# Patient Record
Sex: Male | Born: 1935
Health system: Southern US, Community
[De-identification: ages and names within clinical notes are randomized; demographics above are authoritative.]

## PROBLEM LIST (undated history)

## (undated) DIAGNOSIS — K219 Gastro-esophageal reflux disease without esophagitis: Secondary | ICD-10-CM

## (undated) DIAGNOSIS — N184 Chronic kidney disease, stage 4 (severe): Secondary | ICD-10-CM

## (undated) DIAGNOSIS — C61 Malignant neoplasm of prostate: Secondary | ICD-10-CM

## (undated) DIAGNOSIS — I10 Essential (primary) hypertension: Secondary | ICD-10-CM

## (undated) DIAGNOSIS — E119 Type 2 diabetes mellitus without complications: Secondary | ICD-10-CM

## (undated) DIAGNOSIS — M1990 Unspecified osteoarthritis, unspecified site: Secondary | ICD-10-CM

## (undated) DIAGNOSIS — N186 End stage renal disease: Secondary | ICD-10-CM

## (undated) DIAGNOSIS — D649 Anemia, unspecified: Secondary | ICD-10-CM

## (undated) DIAGNOSIS — E785 Hyperlipidemia, unspecified: Secondary | ICD-10-CM

## (undated) DIAGNOSIS — Z992 Dependence on renal dialysis: Secondary | ICD-10-CM

## (undated) DIAGNOSIS — N289 Disorder of kidney and ureter, unspecified: Secondary | ICD-10-CM

## (undated) HISTORY — DX: Dependence on renal dialysis: Z99.2

## (undated) HISTORY — PX: COLONOSCOPY: SHX5424

## (undated) HISTORY — PX: CYSTECTOMY: SUR359

## (undated) HISTORY — DX: End stage renal disease: N18.6

## (undated) NOTE — *Deleted (*Deleted)
Transition of Care [facility]) - CM/SW Discharge Note   Patient Details  Name: Trumon Berding MRN: [id number] Date of Birth: May 22, 1936  Transition of Care [facility]) CM/SW Contact:  Sharin Mons, RN Phone Number: 08/28/2020, 10:00 AM   Clinical Narrative:     Patient will DC to: home  Anticipated DC date: 08/28/2020 Family notified: yes Transport by: car   Per MD patient ready for DC today . RN, patient and  patient's family aware of DC.  tient.   RNCM will sign off for now as intervention is no longer needed. Please consult [place] again if new needs arise.   Final next level of care: [place] Barriers to Discharge: No Barriers Identified   Patient Goals and CMS Choice Patient states their goals for this hospitalization and ongoing recovery are:: to get better   Choice offered to / list presented to : Patient  Discharge Placement                       Discharge Plan and Services   Discharge Planning Services: CM Consult Post Acute Care Choice: Durable Medical Equipment          DME Arranged: 3-N-1 DME Agency: [employer] Date DME Agency Contacted: 08/28/20 Time DME Agency Contacted: [phone] Representative spoke with at DME Agency: Nurse to give from floor stock prior to d/c HH Arranged: PT, OT [place] Agency: [place] Date [place]: 08/28/20 Time [place]: [phone] Representative spoke with at [place]: [place] ([place]) Interventions     Readmission Risk Interventions No flowsheet data found.

---

## 2007-07-05 ENCOUNTER — Ambulatory Visit: Payer: Self-pay | Admitting: Cardiology

## 2009-06-12 DIAGNOSIS — R079 Chest pain, unspecified: Secondary | ICD-10-CM | POA: Insufficient documentation

## 2010-09-12 ENCOUNTER — Encounter: Payer: Self-pay | Admitting: Cardiovascular Disease

## 2013-01-11 DIAGNOSIS — E1121 Type 2 diabetes mellitus with diabetic nephropathy: Secondary | ICD-10-CM | POA: Insufficient documentation

## 2013-02-18 ENCOUNTER — Encounter: Payer: Self-pay | Admitting: Cardiovascular Disease

## 2013-02-18 DIAGNOSIS — R0602 Shortness of breath: Secondary | ICD-10-CM

## 2013-03-16 DIAGNOSIS — R079 Chest pain, unspecified: Secondary | ICD-10-CM

## 2013-04-11 ENCOUNTER — Encounter: Payer: Self-pay | Admitting: Cardiology

## 2013-04-12 ENCOUNTER — Encounter: Payer: Self-pay | Admitting: *Deleted

## 2013-04-12 ENCOUNTER — Ambulatory Visit (INDEPENDENT_AMBULATORY_CARE_PROVIDER_SITE_OTHER): Payer: PRIVATE HEALTH INSURANCE | Admitting: Cardiovascular Disease

## 2013-04-12 ENCOUNTER — Encounter: Payer: Self-pay | Admitting: Cardiovascular Disease

## 2013-04-12 VITALS — BP 163/76 | HR 63 | Ht 65.0 in | Wt 176.0 lb

## 2013-04-12 DIAGNOSIS — R079 Chest pain, unspecified: Secondary | ICD-10-CM

## 2013-04-12 DIAGNOSIS — I10 Essential (primary) hypertension: Secondary | ICD-10-CM | POA: Insufficient documentation

## 2013-04-12 DIAGNOSIS — R0602 Shortness of breath: Secondary | ICD-10-CM

## 2013-04-12 DIAGNOSIS — R9431 Abnormal electrocardiogram [ECG] [EKG]: Secondary | ICD-10-CM | POA: Insufficient documentation

## 2013-04-12 MED ORDER — HYDRALAZINE HCL 50 MG PO TABS: 50.0000 mg | ORAL_TABLET | Freq: Two times a day (BID) | ORAL | Status: DC

## 2013-04-12 NOTE — Patient Instructions (Addendum)
   Treadmill Myoview (stress test)  Office will contact with results via phone or letter.    Increase Hydralazine to 50mg  twice a day - new sent to pharm  Continue all other current medications. Follow up in  4-6 weeks

## 2013-04-12 NOTE — Progress Notes (Signed)
Patient ID: Remijio Harvard, male   DOB: 1936/09/11, 77 y.o.   MRN: [id number]    CARDIOLOGY CONSULT NOTE  Patient ID: Ciaran Schwarzkopf MRN: [id number] DOB/AGE: [birth year] 77 y.o.  Admit date: (Not on file) Primary Physician Primary Cardiologist Reason for Consultation: chest pain, shortness of breath  HPI: Mr. Musselwhite is a very pleasant 77 y.o. Male with a h/o HTN and hypercholesterolemia. He also has renal insufficiency with a most recent creatinine of 1.8 mg/dL and a GFR of 45 ml/min. His echocardiogram from 02-18-2013 showed normal LV systolic function, EF 123456, moderate concentric LVH, grade II diastolic dysfunction, mild MR/TR, and moderate pulmonary hypertension with an RVSP of 46 mmHg. His HTN is treated with Hydralazine, Cozaar, and long-acting Nifedipine. His ECG on 02-18-13 showed TWI's in V5-V6 which were less pronounced on a follow-up ECG on 03-16-13.  A CXR on 02-18-13 was normal. His most recent stress test was from 2011 and showed no perfusion abnormalities.  He presents today with chest tightness. He thinks it may be due to stress. It's been going on for a year, but says "it's not that bad". He also has a hiatal hernia. It is accompanied by shortness of breath, but it does not limit his exertional capacity. It may last up to 20 minutes. It is located substernally and sometimes it radiates to the left side of his neck. Yesterday, he had an episode of dizziness but did not lose consciousness. He also mentions he's had right ear tinnitus since December 2013. He seldom has palpitations.  Review of systems complete and found to be negative unless listed above in HPI  Past Medical History: As per HPI  Social Hx: Drove a truck for 52 years, retired in October 2013.  Family History  Problem Relation Age of Onset  . Heart disease Mother     cause of death  . Diabetes Sister     History   Social History  . Marital Status: Divorced    Spouse Name: N/A    Number of Children: N/A   . Years of Education: N/A   Occupational History  . Retired    Social History Main Topics  . Smoking status: Former Smoker -- 2.00 packs/day for 25 years    Types: Pipe, Cigars    Quit date: 10/14/1987  . Smokeless tobacco: Never Used     Comment: About 2 pipes a day  . Alcohol Use: No  . Drug Use: No  . Sexually Active: Not on file   Other Topics Concern  . Not on file   Social History Narrative  . No narrative on file      (Not in a hospital admission)  Physical exam Blood pressure 163/76, pulse 63, height 5\' 5"  (1.651 m), weight 176 lb (79.833 kg), SpO2 99.00%. General: NAD Neck: No JVD, no thyromegaly or thyroid nodule.  Lungs: Clear to auscultation bilaterally with normal respiratory effort. CV: Nondisplaced PMI.  Heart regular S1/S2, no S3/S4, no murmur.  No peripheral edema.  No carotid bruit.  Normal pedal pulses.  Abdomen: Soft, nontender, no hepatosplenomegaly, no distention.  Skin: Intact without lesions or rashes.  Neurologic: Alert and oriented x 3.  Psych: Normal affect. Extremities: No clubbing or cyanosis.  HEENT: Normal.   Labs:   No results found for this basename: WBC, HGB, HCT, MCV, PLT   No results found for this basename: NA, K, CL, CO2, BUN, CREATININE, CALCIUM, LABALBU, PROT, BILITOT, ALKPHOS, ALT, AST, GLUCOSE,  in the last 168 hours No  results found for this basename: CKTOTAL, CKMB, CKMBINDEX, TROPONINI    No results found for this basename: CHOL   No results found for this basename: HDL   No results found for this basename: LDLCALC   No results found for this basename: TRIG   No results found for this basename: CHOLHDL   No results found for this basename: LDLDIRECT       EKG: see HPI Radiology: previous CXR normal in May 2014.   ASSESSMENT AND PLAN:  1. Chest pain and SOB: will plan for a treadmill Myoview stress test to r/o occult ischemia.  2. HTN: will increase Hydralazine to 50 mg bid.  Signed: @ME1 @ 04/12/2013, 3:19  PM Co-Sign MD

## 2013-04-13 ENCOUNTER — Other Ambulatory Visit: Payer: Self-pay | Admitting: *Deleted

## 2013-04-13 ENCOUNTER — Telehealth: Payer: Self-pay | Admitting: Cardiovascular Disease

## 2013-04-13 DIAGNOSIS — R079 Chest pain, unspecified: Secondary | ICD-10-CM

## 2013-04-13 DIAGNOSIS — R9431 Abnormal electrocardiogram [ECG] [EKG]: Secondary | ICD-10-CM

## 2013-04-13 DIAGNOSIS — R0602 Shortness of breath: Secondary | ICD-10-CM

## 2013-04-13 NOTE — Telephone Encounter (Signed)
Treadmill Myoview (stress test) Weight 176 04-18-13 [place]     Checking percert  Chest pain -

## 2013-04-13 NOTE — Telephone Encounter (Signed)
Clinicals faxed to [facility]

## 2013-04-14 NOTE — Telephone Encounter (Signed)
Auth # [id number] valid 04/18/13 - 05/19/13

## 2013-04-18 DIAGNOSIS — R9431 Abnormal electrocardiogram [ECG] [EKG]: Secondary | ICD-10-CM

## 2013-05-16 ENCOUNTER — Ambulatory Visit (INDEPENDENT_AMBULATORY_CARE_PROVIDER_SITE_OTHER): Payer: PRIVATE HEALTH INSURANCE | Admitting: Cardiovascular Disease

## 2013-05-16 ENCOUNTER — Encounter: Payer: Self-pay | Admitting: Cardiovascular Disease

## 2013-05-16 VITALS — BP 117/67 | HR 88 | Ht 65.0 in | Wt 175.0 lb

## 2013-05-16 DIAGNOSIS — I10 Essential (primary) hypertension: Secondary | ICD-10-CM

## 2013-05-16 DIAGNOSIS — R9431 Abnormal electrocardiogram [ECG] [EKG]: Secondary | ICD-10-CM

## 2013-05-16 DIAGNOSIS — R0602 Shortness of breath: Secondary | ICD-10-CM

## 2013-05-16 DIAGNOSIS — R079 Chest pain, unspecified: Secondary | ICD-10-CM

## 2013-05-16 NOTE — Progress Notes (Signed)
Patient ID: Steve [name], male   DOB: 1936/10/08, 77 y.o.   MRN: [id number]    SUBJECTIVE: Steve [name] is a very pleasant 77 y.o. Male with a h/o HTN and hypercholesterolemia. He also has renal insufficiency with a most recent creatinine of 1.8 mg/dL and a GFR of 45 ml/min. His echocardiogram from 02-18-2013 showed normal LV systolic function, EF 123456, moderate concentric LVH, grade II diastolic dysfunction, mild MR/TR, and moderate pulmonary hypertension with an RVSP of 46 mmHg.  His HTN is treated with Hydralazine, Cozaar, and long-acting Nifedipine.  He is here to f/u on his stress test, which showed normal myocardial perfusion with limited exercise tolerance (exercised for 1:52, achieving 4.6 METS). There was a small inferior area of reversibility that appeared to be most consistent with soft tissue attenuation.  He tells me that just preceding his stress test, his feet had a gout flare-up which lasted about a week, but now he feels very well. This is what apparently limited his ability to walk on the treadmill.  He currently denies chest pain and says he very seldom gets shortness of breath. He seldom has palpitations, and this is never accompanied by lightheadedness or dizziness.  I increased his Hydralazine at the last office visit, and his BP is now controlled.  He drove a truck for 54 years.   Filed Vitals:   05/16/13 0848  BP: 117/67  Pulse: 88  Height: 5\' 5"  (1.651 m)  Weight: 175 lb (79.379 kg)    PHYSICAL EXAM General: NAD Neck: No JVD, no thyromegaly or thyroid nodule.  Lungs: Clear to auscultation bilaterally with normal respiratory effort. CV: Nondisplaced PMI.  Heart regular S1/S2, no S3/S4, no murmur.  No peripheral edema.  No carotid bruit.  Normal pedal pulses.  Abdomen: Soft, nontender, no hepatosplenomegaly, no distention.  Neurologic: Alert and oriented x 3.  Psych: Normal affect. Extremities: No clubbing or cyanosis.     LABS: Basic Metabolic Panel: No  results found for this basename: NA, K, CL, CO2, GLUCOSE, BUN, CREATININE, CALCIUM, MG, PHOS,  in the last 72 hours Liver Function Tests: No results found for this basename: AST, ALT, ALKPHOS, BILITOT, PROT, ALBUMIN,  in the last 72 hours No results found for this basename: LIPASE, AMYLASE,  in the last 72 hours CBC: No results found for this basename: WBC, NEUTROABS, HGB, HCT, MCV, PLT,  in the last 72 hours Cardiac Enzymes: No results found for this basename: CKTOTAL, CKMB, CKMBINDEX, TROPONINI,  in the last 72 hours BNP: No components found with this basename: POCBNP,  D-Dimer: No results found for this basename: DDIMER,  in the last 72 hours Hemoglobin A1C: No results found for this basename: HGBA1C,  in the last 72 hours Fasting Lipid Panel: No results found for this basename: CHOL, HDL, LDLCALC, TRIG, CHOLHDL, LDLDIRECT,  in the last 72 hours Thyroid Function Tests: No results found for this basename: TSH, T4TOTAL, FREET3, T3FREE, THYROIDAB,  in the last 72 hours Anemia Panel: No results found for this basename: VITAMINB12, FOLATE, FERRITIN, TIBC, IRON, RETICCTPCT,  in the last 72 hours  RADIOLOGY: No results found.    ASSESSMENT AND PLAN: 1. Chest pain and SOB: resolved, normal myocardial perfusion noted, no further testing indicated. 2. HTN: controlled with recent increase in Hydralazine dosage. I strongly encouraged walking and light to moderate exercise in general.    Kate Sable, M.D., F.A.C.C.

## 2013-05-16 NOTE — Patient Instructions (Signed)
Your physician recommends that you schedule a follow-up appointment in: 1 year with Dr. Bronson Ing. You should receive a letter in 10 months to call and schedule this appointment. If you do not receive this letter around June 2015 call our office to schedule this appointment.  Continue all current medications. Refer to the list given to you today.

## 2013-12-03 ENCOUNTER — Encounter: Payer: Self-pay | Admitting: Cardiovascular Disease

## 2013-12-04 ENCOUNTER — Encounter: Payer: Self-pay | Admitting: Cardiovascular Disease

## 2013-12-12 ENCOUNTER — Ambulatory Visit (INDEPENDENT_AMBULATORY_CARE_PROVIDER_SITE_OTHER): Payer: Medicare Other | Admitting: Cardiovascular Disease

## 2013-12-12 ENCOUNTER — Encounter: Payer: Self-pay | Admitting: Cardiovascular Disease

## 2013-12-12 VITALS — BP 138/74 | HR 88 | Ht 65.0 in | Wt 177.1 lb

## 2013-12-12 DIAGNOSIS — J40 Bronchitis, not specified as acute or chronic: Secondary | ICD-10-CM

## 2013-12-12 DIAGNOSIS — I10 Essential (primary) hypertension: Secondary | ICD-10-CM

## 2013-12-12 DIAGNOSIS — R0602 Shortness of breath: Secondary | ICD-10-CM

## 2013-12-12 DIAGNOSIS — R079 Chest pain, unspecified: Secondary | ICD-10-CM

## 2013-12-12 DIAGNOSIS — R002 Palpitations: Secondary | ICD-10-CM

## 2013-12-12 NOTE — Patient Instructions (Signed)
Your physician recommends that you schedule a follow-up appointment in: 6 weeks with Dr. Bronson Ing. This appointment will be scheduled today before you leave.  Your physician recommends that you continue on your current medications as directed. Please refer to the Current Medication list given to you today.

## 2013-12-12 NOTE — Progress Notes (Signed)
Patient ID: Steve [name], male   DOB: 03/22/1936, 78 y.o.   MRN: [id number]      SUBJECTIVE: Steve [name] is a very pleasant 78 yr old male with a h/o HTN and hypercholesterolemia and renal insufficiency. His echocardiogram from 02-18-2013 showed normal LV systolic function, EF 123456, moderate concentric LVH, grade II diastolic dysfunction, mild MR/TR, and moderate pulmonary hypertension with an RVSP of 46 mmHg.  His HTN is treated with hydralazine, Cozaar, and long-acting nifedipine.  He had a stress test in 2014 which showed normal myocardial perfusion with limited exercise tolerance (exercised for 1:52, achieving 4.6 METS). There was a small inferior area of reversibility that appeared to be most consistent with soft tissue attenuation.  He was recently evaluated at [facility] for chest pain. At that time, he described a squeezing sensation in his anterior chest wall. His symptoms were relieved with nitroglycerin. He reports having occasional chest discomfort when he walks up and down a hallway. He was evaluated by nephrology and their recommendation was to maintain a high threshold for coronary angiography, given the patient's high risk for contrast-induced nephropathy. Troponins were normal during that hospitalization and his ECG he reportedly did not show any acute ischemic changes. When cardiology evaluated him, they recommended that he undergo coronary angiography once his CKD was stable.  He was recently diagnosed with bronchitis and was started on ciprofloxacin and prednisone. He is now feeling much n no longer has any chest tightness nor a bothersome cough, which he had been experiencing for nearly 6-8 weeks. He has felt some occasional palpitations. He is otherwise feeling much better.     No Known Allergies  Current Outpatient Prescriptions  Medication Sig Dispense Refill  . acetaminophen (TYLENOL) 650 MG CR tablet Take 650 mg by mouth every 8 (eight) hours as needed  for pain.      Marland Kitchen albuterol-ipratropium (COMBIVENT) 18-103 MCG/ACT inhaler Inhale 1-2 puffs into the lungs every 4 (four) hours.      . Alum & Mag Hydroxide-Simeth (ANTACID ANTI-GAS FAST ACTING PO) Take 1 tablet by mouth as needed.      Marland Kitchen aspirin 81 MG tablet Take 81 mg by mouth daily.      . carboxymethylcellulose (REFRESH TEARS) 0.5 % SOLN Place 1 drop into both eyes 3 (three) times daily as needed.      . Cholecalciferol (VITAMIN D3) 2000 UNITS capsule Take 2,000 Units by mouth daily.      . ciprofloxacin (CIPRO) 500 MG tablet Take 500 mg by mouth 2 (two) times daily.      . hydrALAZINE (APRESOLINE) 50 MG tablet Take 1 tablet (50 mg total) by mouth 2 (two) times daily.  60 tablet  6  . isosorbide mononitrate (IMDUR) 30 MG 24 hr tablet Take 30 mg by mouth daily.      Marland Kitchen linagliptin (TRADJENTA) 5 MG TABS tablet Take 5 mg by mouth daily.      Marland Kitchen losartan (COZAAR) 100 MG tablet Take 1 tablet by mouth daily.      Marland Kitchen NIFEdipine (PROCARDIA XL/ADALAT-CC) 60 MG 24 hr tablet Take 1 tablet by mouth daily.      Marland Kitchen omeprazole (PRILOSEC) 20 MG capsule Take 1 capsule by mouth 2 (two) times daily.      . pravastatin (PRAVACHOL) 40 MG tablet Take 1 tablet by mouth daily.      . predniSONE (DELTASONE) 10 MG tablet Take 10 mg by mouth as directed.      . Sodium Chloride (NASAL MIST  IN) Place 1 spray into the nose as needed.      . tamsulosin (FLOMAX) 0.4 MG CAPS Take 1 capsule by mouth 2 (two) times daily.        No current facility-administered medications for this visit.    No past medical history on file.  Past Surgical History  Procedure Laterality Date  . Cystectomy      History   Social History  . Marital Status: Divorced    Spouse Name: N/A    Number of Children: N/A  . Years of Education: N/A   Occupational History  . Retired    Social History Main Topics  . Smoking status: Former Smoker -- 2.00 packs/day for 25 years    Types: Pipe, Cigars    Quit date: 10/14/1987  . Smokeless tobacco:  Never Used     Comment: About 2 pipes a day  . Alcohol Use: No  . Drug Use: No  . Sexual Activity: Not on file   Other Topics Concern  . Not on file   Social History Narrative  . No narrative on file     Filed Vitals:   12/12/13 1327  Height: 5\' 5"  (1.651 m)  Weight: 177 lb 1.9 oz (80.341 kg)    PHYSICAL EXAM General: NAD Neck: No JVD, no thyromegaly. Lungs: Clear to auscultation bilaterally with normal respiratory effort. CV: Nondisplaced PMI.  Regular rate and rhythm, normal S1/S2, no S3/S4, no murmur. No pretibial or periankle edema.  No carotid bruit.  Normal pedal pulses.  Abdomen: Soft, nontender, no hepatosplenomegaly, no distention.  Neurologic: Alert and oriented x 3.  Psych: Normal affect. Extremities: No clubbing or cyanosis.   ECG: reviewed and available in electronic records.      ASSESSMENT AND PLAN: 1. Chest pain: Again, he had a normal nuclear stress test last year. His symptoms appear to have resolved with the institution of prednisone for bronchitis. I will followup with him in 6 weeks to see if he has a recurrence of chest pain. At that time, I will determine whether we will proceed with coronary angiography. 2. Palpitations: The symptoms are likely exacerbated by the use of prednisone. Again, I will followup with him in 6 weeks to see how he is doing from this standpoint. 3. HTN: Controlled on present therapy.  Dispo: f/u 6 weeks.  Kate Sable, M.D., F.A.C.C.

## 2014-02-03 ENCOUNTER — Encounter: Payer: Self-pay | Admitting: Cardiovascular Disease

## 2014-02-03 ENCOUNTER — Ambulatory Visit: Payer: PRIVATE HEALTH INSURANCE | Admitting: Cardiovascular Disease

## 2014-02-03 ENCOUNTER — Ambulatory Visit (INDEPENDENT_AMBULATORY_CARE_PROVIDER_SITE_OTHER): Payer: Medicare Other | Admitting: Cardiovascular Disease

## 2014-02-03 VITALS — BP 152/80 | HR 78 | Ht 65.0 in | Wt 176.0 lb

## 2014-02-03 DIAGNOSIS — R079 Chest pain, unspecified: Secondary | ICD-10-CM

## 2014-02-03 DIAGNOSIS — C61 Malignant neoplasm of prostate: Secondary | ICD-10-CM

## 2014-02-03 DIAGNOSIS — R002 Palpitations: Secondary | ICD-10-CM

## 2014-02-03 DIAGNOSIS — I10 Essential (primary) hypertension: Secondary | ICD-10-CM

## 2014-02-03 MED ORDER — HYDRALAZINE HCL 50 MG PO TABS: 50.0000 mg | ORAL_TABLET | Freq: Three times a day (TID) | ORAL | Status: DC

## 2014-02-03 NOTE — Progress Notes (Signed)
Patient ID: Steve [name], male   DOB: 05/08/1936, 78 y.o.   MRN: [id number]      SUBJECTIVE: Steve [name] is a very pleasant 78 yr old male with a h/o HTN and hypercholesterolemia and renal insufficiency. His echocardiogram from 02-18-2013 showed normal LV systolic function, EF 123456, moderate concentric LVH, grade II diastolic dysfunction, mild MR/TR, and moderate pulmonary hypertension with an RVSP of 46 mmHg.  His HTN is treated with hydralazine, Cozaar, and long-acting nifedipine.   He had a stress test in 2014 which showed normal myocardial perfusion with limited exercise tolerance (exercised for 1:52, achieving 4.6 METS). There was a small inferior area of reversibility that appeared to be most consistent with soft tissue attenuation.  He was evaluated earlier this year at [facility] for chest pain. At that time, he described a squeezing sensation in his anterior chest wall. His symptoms were relieved with nitroglycerin. He reported having occasional chest discomfort when he walks up and down a hallway. He was evaluated by nephrology and their recommendation was to maintain a high threshold for coronary angiography, given the patient's high risk for contrast-induced nephropathy. Troponins were normal during that hospitalization and his ECG he reportedly did not show any acute ischemic changes. When cardiology evaluated him, they recommended that he undergo coronary angiography once his CKD was stable.   He currently denies any chest pain. He has been having infrequent and episodic palpitations. He was diagnosed with prostate cancer approximately 3 weeks ago, and is planning to undergo radiation treatment. He is also currently being treated for a prostate infection. Due to this, he has had a lot of stress. He checks his blood pressure at home and systolic readings have been in the 150 mmHg range.    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    No Known Allergies  Current  Outpatient Prescriptions  Medication Sig Dispense Refill  . acetaminophen (TYLENOL) 650 MG CR tablet Take 650 mg by mouth every 8 (eight) hours as needed for pain.      Marland Kitchen aspirin 81 MG tablet Take 81 mg by mouth daily.      . Cholecalciferol (VITAMIN D3) 2000 UNITS capsule Take 2,000 Units by mouth daily.      . hydrALAZINE (APRESOLINE) 50 MG tablet Take 1 tablet (50 mg total) by mouth 2 (two) times daily.  60 tablet  6  . linagliptin (TRADJENTA) 5 MG TABS tablet Take 5 mg by mouth daily.      Marland Kitchen losartan (COZAAR) 100 MG tablet Take 1 tablet by mouth daily.      Marland Kitchen NIFEdipine (PROCARDIA XL/ADALAT-CC) 60 MG 24 hr tablet Take 1 tablet by mouth daily.      . NON FORMULARY Take 1 capsule by mouth 3 (three) times daily as needed. LIPO-FLAVONOID PLUS (INNER EAR HEALTH) OTC      . omeprazole (PRILOSEC) 20 MG capsule Take 1 capsule by mouth 2 (two) times daily.      . pravastatin (PRAVACHOL) 40 MG tablet Take 1 tablet by mouth daily.      . tamsulosin (FLOMAX) 0.4 MG CAPS Take 1 capsule by mouth 2 (two) times daily.        No current facility-administered medications for this visit.    No past medical history on file.  Past Surgical History  Procedure Laterality Date  . Cystectomy      History   Social History  . Marital Status: Divorced    Spouse Name: N/A  Number of Children: N/A  . Years of Education: N/A   Occupational History  . Retired    Social History Main Topics  . Smoking status: Former Smoker -- 2.00 packs/day for 25 years    Types: Pipe, Cigars    Quit date: 10/14/1987  . Smokeless tobacco: Never Used     Comment: About 2 pipes a day  . Alcohol Use: No  . Drug Use: No  . Sexual Activity: Not on file   Other Topics Concern  . Not on file   Social History Narrative  . No narrative on file     Filed Vitals:   02/03/14 0944  BP: 152/80  Pulse: 78  Height: 5\' 5"  (1.651 m)  Weight: 176 lb (79.833 kg)    PHYSICAL EXAM General: NAD Neck: No JVD, no  thyromegaly. Lungs: Clear to auscultation bilaterally with normal respiratory effort. CV: Nondisplaced PMI.  Regular rate and rhythm, normal S1/S2, no S3/S4, no murmur. No pretibial or periankle edema.  No carotid bruit.  Normal pedal pulses.  Abdomen: Soft, nontender, no hepatosplenomegaly, no distention.  Neurologic: Alert and oriented x 3.  Psych: Normal affect. Extremities: No clubbing or cyanosis.   ECG: reviewed and available in electronic records.      ASSESSMENT AND PLAN: 1. Chest pain: No further episodes. Normal nuclear stress test last year. I will see him again in 2-3 months, and will determine whether or not to proceed with coronary angiography.  2. Palpitations: Infrequent at this time. No therapy indicated. 3. HTN: Elevated on present therapy. Will increase hydralazine to 50 mg tid. 4. Prostate cancer: Sees Dr. Exie Parody. Soon to begin radiation treatment.  Dispo: f/u 2-3 months.   Kate Sable, M.D., F.A.C.C.

## 2014-02-03 NOTE — Patient Instructions (Signed)
   Increase Hydralazine to 50mg  three times per day - new sent to pharm Continue all other medications.   Follow up in  4 months

## 2014-02-04 ENCOUNTER — Emergency Department (HOSPITAL_COMMUNITY)
Admission: EM | Admit: 2014-02-04 | Discharge: 2014-02-04 | Disposition: A | Discharge status: Home / Self Care | Payer: Medicare Other | Attending: Emergency Medicine | Admitting: Emergency Medicine

## 2014-02-04 ENCOUNTER — Emergency Department (HOSPITAL_COMMUNITY): Payer: Medicare Other

## 2014-02-04 ENCOUNTER — Encounter (HOSPITAL_COMMUNITY): Payer: Self-pay | Admitting: Emergency Medicine

## 2014-02-04 DIAGNOSIS — Z7982 Long term (current) use of aspirin: Secondary | ICD-10-CM | POA: Insufficient documentation

## 2014-02-04 DIAGNOSIS — R072 Precordial pain: Secondary | ICD-10-CM | POA: Insufficient documentation

## 2014-02-04 DIAGNOSIS — Z859 Personal history of malignant neoplasm, unspecified: Secondary | ICD-10-CM | POA: Insufficient documentation

## 2014-02-04 DIAGNOSIS — Z79899 Other long term (current) drug therapy: Secondary | ICD-10-CM | POA: Insufficient documentation

## 2014-02-04 DIAGNOSIS — M129 Arthropathy, unspecified: Secondary | ICD-10-CM | POA: Insufficient documentation

## 2014-02-04 DIAGNOSIS — Z792 Long term (current) use of antibiotics: Secondary | ICD-10-CM | POA: Insufficient documentation

## 2014-02-04 DIAGNOSIS — Z87891 Personal history of nicotine dependence: Secondary | ICD-10-CM | POA: Insufficient documentation

## 2014-02-04 DIAGNOSIS — I10 Essential (primary) hypertension: Secondary | ICD-10-CM | POA: Insufficient documentation

## 2014-02-04 DIAGNOSIS — E119 Type 2 diabetes mellitus without complications: Secondary | ICD-10-CM | POA: Insufficient documentation

## 2014-02-04 DIAGNOSIS — R079 Chest pain, unspecified: Secondary | ICD-10-CM

## 2014-02-04 HISTORY — DX: Unspecified osteoarthritis, unspecified site: M19.90

## 2014-02-04 LAB — COMPREHENSIVE METABOLIC PANEL
ALT: 17 U/L (ref 0–53)
AST: 20 U/L (ref 0–37)
Albumin: 3.8 g/dL (ref 3.5–5.2)
Alkaline Phosphatase: 62 U/L (ref 39–117)
BUN: 23 mg/dL (ref 6–23)
CO2: 21 mEq/L (ref 19–32)
Calcium: 9.1 mg/dL (ref 8.4–10.5)
Chloride: 102 mEq/L (ref 96–112)
Creatinine, Ser: 2.41 mg/dL — ABNORMAL HIGH (ref 0.50–1.35)
GFR calc Af Amer: 28 mL/min — ABNORMAL LOW (ref >90)
GFR calc non Af Amer: 25 mL/min — ABNORMAL LOW (ref >90)
Glucose, Bld: 207 mg/dL — ABNORMAL HIGH (ref 70–99)
Potassium: 3.9 mEq/L (ref 3.7–5.3)
Sodium: 137 mEq/L (ref 137–147)
Total Bilirubin: 0.4 mg/dL (ref 0.3–1.2)
Total Protein: 7.1 g/dL (ref 6.0–8.3)

## 2014-02-04 LAB — CBC WITH DIFFERENTIAL/PLATELET
Basophils Absolute: 0 10*3/uL (ref 0.0–0.1)
Basophils Relative: 0 % (ref 0–1)
Eosinophils Absolute: 0 10*3/uL (ref 0.0–0.7)
Eosinophils Relative: 1 % (ref 0–5)
HCT: 36.2 % — ABNORMAL LOW (ref 39.0–52.0)
Hemoglobin: 12.5 g/dL — ABNORMAL LOW (ref 13.0–17.0)
Lymphocytes Relative: 20 % (ref 12–46)
Lymphs Abs: 0.9 10*3/uL (ref 0.7–4.0)
MCH: 26 pg (ref 26.0–34.0)
MCHC: 34.5 g/dL (ref 30.0–36.0)
MCV: 75.3 fL — ABNORMAL LOW (ref 78.0–100.0)
Monocytes Absolute: 0.7 10*3/uL (ref 0.1–1.0)
Monocytes Relative: 17 % — ABNORMAL HIGH (ref 3–12)
Neutro Abs: 2.7 10*3/uL (ref 1.7–7.7)
Neutrophils Relative %: 62 % (ref 43–77)
Platelets: 171 10*3/uL (ref 150–400)
RBC: 4.81 MIL/uL (ref 4.22–5.81)
RDW: 13.7 % (ref 11.5–15.5)
WBC: 4.3 10*3/uL (ref 4.0–10.5)

## 2014-02-04 LAB — TROPONIN I
Troponin I: <0.3 ng/mL (ref <0.30)
Troponin I: <0.3 ng/mL (ref <0.30)

## 2014-02-04 MED ORDER — HYDROCODONE-ACETAMINOPHEN 5-325 MG PO TABS
1.0000 | ORAL_TABLET | Freq: Once | ORAL | Status: AC
  Administered 2014-02-04: 1 via ORAL
  Filled 2014-02-04: qty 1

## 2014-02-04 MED ORDER — HYDROCODONE-ACETAMINOPHEN 5-325 MG PO TABS: 1.0000 | ORAL_TABLET | Freq: Four times a day (QID) | ORAL | Status: DC | PRN

## 2014-02-04 NOTE — ED Notes (Addendum)
Pt presents with left sided chest pain and pressure that started yesterday. Pt states that he has been feeling dizzy and lightheaded. Denies N/V. NAD noted. Airway patent.

## 2014-02-04 NOTE — ED Provider Notes (Signed)
CSN: [id number]     Arrival date & time 02/04/14  1454 History   First MD Initiated Contact with Patient 02/04/14 1530     Chief Complaint  Patient presents with  . Chest Pain     (Consider location/radiation/quality/duration/timing/severity/associated sxs/prior Treatment) Patient is a 78 y.o. male presenting with chest pain. The history is provided by the patient (the pt complains of chest pain. he was seen by cardiology yesterday).  Chest Pain Pain location:  Substernal area Pain quality: aching   Pain radiates to:  Does not radiate Pain radiates to the back: no   Pain severity:  Moderate Onset quality:  Gradual Timing:  Constant Chronicity:  Recurrent Context: breathing   Associated symptoms: no abdominal pain, no back pain, no cough, no fatigue and no headache     Past Medical History  Diagnosis Date  . Hypertension   . Diabetes mellitus without complication   . Cancer   . Arthritis    Past Surgical History  Procedure Laterality Date  . Cystectomy     Family History  Problem Relation Age of Onset  . Heart disease Mother     cause of death  . Diabetes Sister    History  Substance Use Topics  . Smoking status: Former Smoker -- 2.00 packs/day for 25 years    Types: Pipe, Cigars    Quit date: 10/14/1987  . Smokeless tobacco: Never Used     Comment: About 2 pipes a day  . Alcohol Use: No    Review of Systems  Constitutional: Negative for appetite change and fatigue.  HENT: Negative for congestion, ear discharge and sinus pressure.   Eyes: Negative for discharge.  Respiratory: Negative for cough.   Cardiovascular: Positive for chest pain.  Gastrointestinal: Negative for abdominal pain and diarrhea.  Genitourinary: Negative for frequency and hematuria.  Musculoskeletal: Negative for back pain.  Skin: Negative for rash.  Neurological: Negative for seizures and headaches.  Psychiatric/Behavioral: Negative for hallucinations.      Allergies  Review of  patient's allergies indicates no known allergies.  Home Medications   Prior to Admission medications   Medication Sig Start Date End Date Taking? Authorizing Provider  acetaminophen (TYLENOL) 650 MG CR tablet Take 650 mg by mouth every 8 (eight) hours as needed for pain.   Yes Historical Provider, MD  aspirin 81 MG tablet Take 81 mg by mouth daily.   Yes Historical Provider, MD  Cholecalciferol (VITAMIN D3) 2000 UNITS capsule Take 2,000 Units by mouth daily.   Yes Historical Provider, MD  hydrALAZINE (APRESOLINE) 50 MG tablet Take 50 mg by mouth 2 (two) times daily. 02/03/14  Yes Herminio Commons, MD  linagliptin (TRADJENTA) 5 MG TABS tablet Take 5 mg by mouth daily.   Yes Historical Provider, MD  losartan (COZAAR) 100 MG tablet Take 1 tablet by mouth daily. 04/06/13  Yes Historical Provider, MD  NIFEdipine (PROCARDIA XL/ADALAT-CC) 60 MG 24 hr tablet Take 1 tablet by mouth daily. 04/07/13  Yes Historical Provider, MD  NON FORMULARY Take 1 capsule by mouth 3 (three) times daily as needed. LIPO-FLAVONOID PLUS (INNER EAR HEALTH) OTC   Yes Historical Provider, MD  omeprazole (PRILOSEC) 20 MG capsule Take 1 capsule by mouth 2 (two) times daily. 04/06/13  Yes Historical Provider, MD  pravastatin (PRAVACHOL) 40 MG tablet Take 1 tablet by mouth daily. 04/06/13  Yes Historical Provider, MD  sulfamethoxazole-trimethoprim (BACTRIM DS) 800-160 MG per tablet Take 1 tablet by mouth 2 (two) times daily.  Yes Historical Provider, MD  tamsulosin (FLOMAX) 0.4 MG CAPS Take 1 capsule by mouth 2 (two) times daily.  04/06/13  Yes Historical Provider, MD  HYDROcodone-acetaminophen (NORCO/VICODIN) 5-325 MG per tablet Take 1 tablet by mouth every 6 (six) hours as needed for moderate pain. 02/04/14   Maudry Diego, MD   BP 129/58  Pulse 86  Temp(Src) 98.2 F (36.8 C) (Oral)  Resp 16  Ht 5\' 5"  (1.651 m)  Wt 176 lb (79.833 kg)  BMI 29.29 kg/m2  SpO2 100% Physical Exam  Constitutional: He is oriented to person,  place, and time. He appears well-developed.  HENT:  Head: Normocephalic.  Eyes: Conjunctivae and EOM are normal. No scleral icterus.  Neck: Neck supple. No thyromegaly present.  Cardiovascular: Normal rate and regular rhythm.  Exam reveals no gallop and no friction rub.   No murmur heard. Pulmonary/Chest: No stridor. He has no wheezes. He has no rales. He exhibits no tenderness.  Abdominal: He exhibits no distension. There is no tenderness. There is no rebound.  Musculoskeletal: Normal range of motion. He exhibits no edema.  Lymphadenopathy:    He has no cervical adenopathy.  Neurological: He is oriented to person, place, and time. He exhibits normal muscle tone. Coordination normal.  Skin: No rash noted. No erythema.  Psychiatric: He has a normal mood and affect. His behavior is normal.    ED Course  Procedures (including critical care time) Labs Review Labs Reviewed  CBC WITH DIFFERENTIAL - Abnormal; Notable for the following:    Hemoglobin 12.5 (*)    HCT 36.2 (*)    MCV 75.3 (*)    Monocytes Relative 17 (*)    All other components within normal limits  COMPREHENSIVE METABOLIC PANEL - Abnormal; Notable for the following:    Glucose, Bld 207 (*)    Creatinine, Ser 2.41 (*)    GFR calc non Af Amer 25 (*)    GFR calc Af Amer 28 (*)    All other components within normal limits  TROPONIN I  TROPONIN I    Imaging Review Dg Chest 2 View  02/04/2014   CLINICAL DATA:  Weakness and shortness of breath.  Chest pain.  EXAM: CHEST  2 VIEW  COMPARISON:  PA and lateral chest 12/07/2013 and single view of the chest 09/12/2013.  FINDINGS: Lungs are clear. Mild cardiomegaly is again seen. No pneumothorax or pleural effusion.  IMPRESSION: Mild cardiomegaly without acute disease.   Electronically Signed   By: Inge Rise M.D.   On: 02/04/2014 16:07     EKG Interpretation   Date/Time:  Saturday February 04 2014 15:24:02 EDT Ventricular Rate:  97 PR Interval:  194 QRS Duration: 106 QT  Interval:  364 QTC Calculation: 462 R Axis:   5 Text Interpretation:  Normal sinus rhythm Normal ECG No previous ECGs  available Confirmed by Arwyn Besaw  MD, Kaitrin Seybold [phone]) on 02/04/2014 8:09:43 PM      MDM   Final diagnoses:  Chest pain    The chart was scribed for me under my direct supervision.  I personally performed the history, physical, and medical decision making and all procedures in the evaluation of this patient.Maudry Diego, MD 02/04/14 2012

## 2014-02-04 NOTE — Discharge Instructions (Signed)
Call your heart md next week and let them know how you are doing

## 2014-05-29 ENCOUNTER — Ambulatory Visit: Payer: PRIVATE HEALTH INSURANCE | Admitting: Cardiovascular Disease

## 2014-06-21 ENCOUNTER — Ambulatory Visit: Payer: Medicare Other | Admitting: Cardiovascular Disease

## 2014-07-10 ENCOUNTER — Ambulatory Visit (INDEPENDENT_AMBULATORY_CARE_PROVIDER_SITE_OTHER): Payer: Medicare Other | Admitting: Cardiovascular Disease

## 2014-07-10 ENCOUNTER — Encounter: Payer: Self-pay | Admitting: Cardiovascular Disease

## 2014-07-10 VITALS — BP 136/77 | HR 74 | Ht 65.0 in | Wt 178.0 lb

## 2014-07-10 DIAGNOSIS — R079 Chest pain, unspecified: Secondary | ICD-10-CM

## 2014-07-10 DIAGNOSIS — I10 Essential (primary) hypertension: Secondary | ICD-10-CM

## 2014-07-10 DIAGNOSIS — E1129 Type 2 diabetes mellitus with other diabetic kidney complication: Secondary | ICD-10-CM

## 2014-07-10 DIAGNOSIS — C61 Malignant neoplasm of prostate: Secondary | ICD-10-CM | POA: Insufficient documentation

## 2014-07-10 DIAGNOSIS — N058 Unspecified nephritic syndrome with other morphologic changes: Secondary | ICD-10-CM

## 2014-07-10 DIAGNOSIS — E785 Hyperlipidemia, unspecified: Secondary | ICD-10-CM

## 2014-07-10 DIAGNOSIS — E1121 Type 2 diabetes mellitus with diabetic nephropathy: Secondary | ICD-10-CM

## 2014-07-10 NOTE — Patient Instructions (Signed)
Continue all current medications. Follow up in  3 months 

## 2014-07-10 NOTE — Progress Notes (Signed)
Patient ID: Joshau Kishbaugh, male   DOB: 11/18/1935, 78 y.o.   MRN: [id number]      SUBJECTIVE: Mr. Vives has a history of HTN, hypercholesterolemia, prostate cancer and CKD. Echocardiogram from 02-18-2013 showed normal LV systolic function, EF 123456, moderate concentric LVH, grade II diastolic dysfunction, mild MR/TR, and moderate pulmonary hypertension with an RVSP of 46 mmHg.  He had a stress test in 2014 which showed normal myocardial perfusion with limited exercise tolerance (exercised for 1:52, achieving 4.6 METS). There was a small inferior area of reversibility that appeared to be most consistent with soft tissue attenuation.  He was evaluated earlier this year at [facility] for chest pain, and again in the ED on 7/30.  On 7/30, ECG, troponin, BNP, CBC and chest xray were all normal. BUN 16, creatinine 1.71.  He has SL nitro but did not take it. He has had no further episodes. He has an occasional "pounding sensation" in the back of his head and has checked BP during those times and they were normal. He completed radiation treatment for prostate cancer.   Review of Systems: As per "subjective", otherwise negative.  No Known Allergies  Current Outpatient Prescriptions  Medication Sig Dispense Refill  . acetaminophen (TYLENOL) 650 MG CR tablet Take 650 mg by mouth every 8 (eight) hours as needed for pain.      Marland Kitchen aspirin 81 MG tablet Take 81 mg by mouth daily.      . Cholecalciferol (VITAMIN D3) 2000 UNITS capsule Take 2,000 Units by mouth daily.      . hydrALAZINE (APRESOLINE) 50 MG tablet Take 50 mg by mouth 2 (two) times daily.      Marland Kitchen HYDROcodone-acetaminophen (NORCO/VICODIN) 5-325 MG per tablet Take 1 tablet by mouth every 6 (six) hours as needed for moderate pain.  20 tablet  0  . linagliptin (TRADJENTA) 5 MG TABS tablet Take 5 mg by mouth daily.      Marland Kitchen losartan (COZAAR) 100 MG tablet Take 1 tablet by mouth daily.      Marland Kitchen NIFEdipine (PROCARDIA XL/ADALAT-CC) 60 MG  24 hr tablet Take 1 tablet by mouth daily.      . NON FORMULARY Take 1 capsule by mouth 3 (three) times daily as needed. LIPO-FLAVONOID PLUS (INNER EAR HEALTH) OTC      . omeprazole (PRILOSEC) 20 MG capsule Take 1 capsule by mouth 2 (two) times daily.      . pravastatin (PRAVACHOL) 40 MG tablet Take 1 tablet by mouth daily.      Marland Kitchen sulfamethoxazole-trimethoprim (BACTRIM DS) 800-160 MG per tablet Take 1 tablet by mouth 2 (two) times daily.      . tamsulosin (FLOMAX) 0.4 MG CAPS Take 1 capsule by mouth 2 (two) times daily.        No current facility-administered medications for this visit.    Past Medical History  Diagnosis Date  . Hypertension   . Diabetes mellitus without complication   . Cancer   . Arthritis     Past Surgical History  Procedure Laterality Date  . Cystectomy      History   Social History  . Marital Status: Divorced    Spouse Name: N/A    Number of Children: N/A  . Years of Education: N/A   Occupational History  . Retired    Social History Main Topics  . Smoking status: Former Smoker -- 2.00 packs/day for 25 years    Types: Pipe, Cigars    Quit date: 10/14/1987  .  Smokeless tobacco: Never Used     Comment: About 2 pipes a day  . Alcohol Use: No  . Drug Use: No  . Sexual Activity: Not on file   Other Topics Concern  . Not on file   Social History Narrative  . No narrative on file     Filed Vitals:   07/10/14 0910  Height: 5\' 5"  (1.651 m)  Weight: 178 lb (80.74 kg)   BP 136/77 Pulse 74   PHYSICAL EXAM General: NAD HEENT: Normal. Neck: No JVD, no thyromegaly. Lungs: Clear to auscultation bilaterally with normal respiratory effort. CV: Nondisplaced PMI.  Regular rate and rhythm, normal S1/S2, no S3/S4, no murmur. No pretibial or periankle edema.  No carotid bruit.  Normal pedal pulses.  Abdomen: Soft, nontender, no hepatosplenomegaly, no distention.  Neurologic: Alert and oriented x 3.  Psych: Normal affect. Skin:  Normal. Musculoskeletal: Normal range of motion, no gross deformities. Extremities: No clubbing or cyanosis.   ECG: Most recent ECG reviewed.      ASSESSMENT AND PLAN: 1. Chest pain: No further episodes. Normal nuclear stress test last year. I will see him again in 3 months. Cardiovascular risk factors include HTN, hyperlipidemia and diabetes. However, he would be at high risk for contrast-induced nephropathy. Continue ASA and statin. I have encouraged him to take SL nitro for chest pain recurrences and to inform me if this occurs. 2. Palpitations: Infrequent at this time. No therapy indicated.  3. Essential HTN: Controlled on present therapy.  4. Prostate cancer: Sees Dr. Exie Parody. Soon to begin radiation treatment.  5. Type 2 diabetes: On linagliptin. 6. Hyperlipidemia: On statin therapy.  Dispo: f/u 3 months.   Kate Sable, M.D., F.A.C.C.

## 2014-09-19 ENCOUNTER — Encounter: Payer: Self-pay | Admitting: Cardiovascular Disease

## 2014-10-09 ENCOUNTER — Ambulatory Visit: Payer: Medicare Other | Admitting: Cardiovascular Disease

## 2014-11-08 ENCOUNTER — Ambulatory Visit (INDEPENDENT_AMBULATORY_CARE_PROVIDER_SITE_OTHER): Payer: Medicare Other | Admitting: Cardiovascular Disease

## 2014-11-08 ENCOUNTER — Encounter: Payer: Self-pay | Admitting: Cardiovascular Disease

## 2014-11-08 VITALS — BP 142/84 | HR 72 | Ht 65.0 in | Wt 182.0 lb

## 2014-11-08 DIAGNOSIS — E785 Hyperlipidemia, unspecified: Secondary | ICD-10-CM

## 2014-11-08 DIAGNOSIS — E1121 Type 2 diabetes mellitus with diabetic nephropathy: Secondary | ICD-10-CM

## 2014-11-08 DIAGNOSIS — C61 Malignant neoplasm of prostate: Secondary | ICD-10-CM

## 2014-11-08 DIAGNOSIS — R079 Chest pain, unspecified: Secondary | ICD-10-CM

## 2014-11-08 DIAGNOSIS — I10 Essential (primary) hypertension: Secondary | ICD-10-CM

## 2014-11-08 NOTE — Patient Instructions (Signed)
Your physician wants you to follow-up in: 6 months with Dr. Koneswaran. You will receive a reminder letter in the mail two months in advance. If you don't receive a letter, please call our office to schedule the follow-up appointment.  Your physician recommends that you continue on your current medications as directed. Please refer to the Current Medication list given to you today.  Thank you for choosing [facility]!   

## 2014-11-08 NOTE — Progress Notes (Signed)
Patient ID: Steve [name], male   DOB: [birth year], 79 y.o.   MRN: [id number]      SUBJECTIVE: Steve [name] has a history of HTN, hypercholesterolemia, prostate cancer treated with radiation therapy, and CKD. Echocardiogram from 02-18-2013 showed normal LV systolic function, EF 123456, moderate concentric LVH, grade II diastolic dysfunction, mild MR/TR, and moderate pulmonary hypertension with an RVSP of 46 mmHg.  He had a stress test in 2014 which showed normal myocardial perfusion with limited exercise tolerance (exercised for 1:52, achieving 4.6 METS). There was a small inferior area of reversibility that appeared to be most consistent with soft tissue attenuation.  He has not had any chest pain in several months.    Review of Systems: As per "subjective", otherwise negative.  No Known Allergies  Current Outpatient Prescriptions  Medication Sig Dispense Refill  . acetaminophen (TYLENOL) 650 MG CR tablet Take 650 mg by mouth every 8 (eight) hours as needed for pain.    Marland Kitchen aspirin 81 MG tablet Take 81 mg by mouth daily.    . Cholecalciferol (VITAMIN D3) 2000 UNITS capsule Take 2,000 Units by mouth daily.    Marland Kitchen gabapentin (NEURONTIN) 100 MG capsule Take 100 mg by mouth 2 (two) times daily.    Marland Kitchen linagliptin (TRADJENTA) 5 MG TABS tablet Take 5 mg by mouth daily.    Marland Kitchen losartan (COZAAR) 100 MG tablet Take 1 tablet by mouth daily.    Marland Kitchen NIFEdipine (PROCARDIA XL/ADALAT-CC) 60 MG 24 hr tablet Take 1 tablet by mouth daily.    . nitroGLYCERIN (NITROSTAT) 0.4 MG SL tablet Place 0.4 mg under the tongue every 5 (five) minutes as needed for chest pain.    Marland Kitchen omeprazole (PRILOSEC) 20 MG capsule Take 1 capsule by mouth 2 (two) times daily.    . pravastatin (PRAVACHOL) 40 MG tablet Take 1 tablet by mouth daily.    . tamsulosin (FLOMAX) 0.4 MG CAPS Take 1 capsule by mouth 2 (two) times daily.     . traMADol (ULTRAM) 50 MG tablet Take 50 mg by mouth 2 (two) times daily.     No current facility-administered  medications for this visit.    Past Medical History  Diagnosis Date  . Hypertension   . Diabetes mellitus without complication   . Cancer   . Arthritis     Past Surgical History  Procedure Laterality Date  . Cystectomy      History   Social History  . Marital Status: Divorced    Spouse Name: N/A    Number of Children: N/A  . Years of Education: N/A   Occupational History  . Retired    Social History Main Topics  . Smoking status: Former Smoker -- 2.00 packs/day for 25 years    Types: Pipe, Cigars    Quit date: 10/14/1987  . Smokeless tobacco: Never Used     Comment: About 2 pipes a day  . Alcohol Use: No  . Drug Use: No  . Sexual Activity: Not on file   Other Topics Concern  . Not on file   Social History Narrative  . No narrative on file    BP 142/84  Pulse 72 SpO2 99% Weight 182 lb (82.555 kg) Height 5\' 5"  (1.651 m)    PHYSICAL EXAM General: NAD HEENT: Normal. Neck: No JVD, no thyromegaly. Lungs: Clear to auscultation bilaterally with normal respiratory effort. CV: Nondisplaced PMI.  Regular rate and rhythm, normal S1/S2, no S3/S4, no murmur. No pretibial or periankle edema.  No carotid bruit.  Normal pedal pulses.  Abdomen: Soft, nontender, no hepatosplenomegaly, no distention.  Neurologic: Alert and oriented x 3.  Psych: Normal affect. Skin: Normal. Musculoskeletal: Normal range of motion, no gross deformities. Extremities: No clubbing or cyanosis.   ECG: Most recent ECG reviewed.      ASSESSMENT AND PLAN: 1. Chest pain: No further episodes. Normal nuclear stress test in 2014. Cardiovascular risk factors include HTN, hyperlipidemia and diabetes. Continue ASA and statin. I have encouraged him to take SL nitro for chest pain recurrences and to inform me if this occurs. 2. Palpitations: Infrequent at this time. No therapy indicated.  3. Essential HTN: Mildly elevated on present therapy. Will monitor. No changes. 4. Prostate cancer: Sees Dr.  Exie Parody. Completed radiation treatment.  5. Type 2 diabetes: On linagliptin. 6. Hyperlipidemia: On statin therapy.  Dispo: f/u 6 months.   Kate Sable, M.D., F.A.C.C.

## 2014-11-09 ENCOUNTER — Ambulatory Visit: Payer: Medicare Other | Admitting: Cardiovascular Disease

## 2014-12-20 ENCOUNTER — Ambulatory Visit (INDEPENDENT_AMBULATORY_CARE_PROVIDER_SITE_OTHER): Payer: Medicare Other | Admitting: Cardiovascular Disease

## 2014-12-20 ENCOUNTER — Encounter: Payer: Self-pay | Admitting: Cardiovascular Disease

## 2014-12-20 VITALS — BP 150/80 | HR 65 | Ht 65.0 in | Wt 179.0 lb

## 2014-12-20 DIAGNOSIS — R0609 Other forms of dyspnea: Secondary | ICD-10-CM

## 2014-12-20 DIAGNOSIS — R002 Palpitations: Secondary | ICD-10-CM

## 2014-12-20 DIAGNOSIS — E785 Hyperlipidemia, unspecified: Secondary | ICD-10-CM

## 2014-12-20 DIAGNOSIS — R0600 Dyspnea, unspecified: Secondary | ICD-10-CM

## 2014-12-20 DIAGNOSIS — E1121 Type 2 diabetes mellitus with diabetic nephropathy: Secondary | ICD-10-CM

## 2014-12-20 DIAGNOSIS — R9431 Abnormal electrocardiogram [ECG] [EKG]: Secondary | ICD-10-CM

## 2014-12-20 DIAGNOSIS — R079 Chest pain, unspecified: Secondary | ICD-10-CM

## 2014-12-20 MED ORDER — ISOSORBIDE MONONITRATE ER 30 MG PO TB24: 30.0000 mg | ORAL_TABLET | Freq: Every day | ORAL | Status: DC

## 2014-12-20 NOTE — Patient Instructions (Signed)
Your physician recommends that you schedule a follow-up appointment in: 6 weeks with Dr. Bronson Ing  Your physician has recommended you make the following change in your medication:   START IMDUR 30 MG DAILY  CONTINUE ALL OTHER MEDICATIONS AS DIRECTED  Thank you for choosing [facility]!!

## 2014-12-20 NOTE — Progress Notes (Signed)
Patient ID: Steve [name], male   DOB: 12/11/1935, 79 y.o.   MRN: [id number]      SUBJECTIVE: Steve [name] has a history of HTN, hypercholesterolemia, prostate cancer treated with radiation therapy, and CKD. Echocardiogram from 02-18-2013 showed normal LV systolic function, EF 123456, moderate concentric LVH, grade II diastolic dysfunction, mild MR/TR, and moderate pulmonary hypertension with an RVSP of 46 mmHg.  He had a stress test in 2014 which showed normal myocardial perfusion with limited exercise tolerance (exercised for 1:52, achieving 4.6 METS). There was a small inferior area of reversibility that appeared to be most consistent with soft tissue attenuation. I most recently saw him on 11/08/14 at that time he had been doing well.  He normally walks 1 mile daily. Yesterday, he was only able to walk a half mile due to shortness of breath. An ECG performed in the office today demonstrates sinus rhythm with sinus arrhythmia and T-wave inversions in the inferolateral leads with a nonspecific ST segment abnormality. This ECG is different and worse when compared to an ECG performed on 05/11/14 at [facility], but looks nearly identical to an ECG performed on 02/04/14. He denies exertional chest pain but describes a "pin prick sensation" on the left side of his chest. He also says it hurts to lie down on that side at times.  Review of Systems: As per "subjective", otherwise negative.  No Known Allergies  Current Outpatient Prescriptions  Medication Sig Dispense Refill  . acetaminophen (TYLENOL) 650 MG CR tablet Take 650 mg by mouth every 8 (eight) hours as needed for pain.    Marland Kitchen aspirin 81 MG tablet Take 81 mg by mouth daily.    . Cholecalciferol (VITAMIN D3) 2000 UNITS capsule Take 2,000 Units by mouth daily.    Marland Kitchen gabapentin (NEURONTIN) 100 MG capsule Take 100 mg by mouth 2 (two) times daily.    Marland Kitchen linagliptin (TRADJENTA) 5 MG TABS tablet Take 5 mg by mouth daily.    Marland Kitchen losartan (COZAAR) 100 MG  tablet Take 1 tablet by mouth daily.    Marland Kitchen NIFEdipine (PROCARDIA XL/ADALAT-CC) 60 MG 24 hr tablet Take 1 tablet by mouth daily.    . nitroGLYCERIN (NITROSTAT) 0.4 MG SL tablet Place 0.4 mg under the tongue every 5 (five) minutes as needed for chest pain.    Marland Kitchen omeprazole (PRILOSEC) 20 MG capsule Take 1 capsule by mouth 2 (two) times daily.    . pravastatin (PRAVACHOL) 40 MG tablet Take 1 tablet by mouth daily.    . tamsulosin (FLOMAX) 0.4 MG CAPS Take 1 capsule by mouth 2 (two) times daily.     . traMADol (ULTRAM) 50 MG tablet Take 50 mg by mouth 2 (two) times daily.     No current facility-administered medications for this visit.    Past Medical History  Diagnosis Date  . Hypertension   . Diabetes mellitus without complication   . Cancer   . Arthritis     Past Surgical History  Procedure Laterality Date  . Cystectomy      History   Social History  . Marital Status: Divorced    Spouse Name: N/A  . Number of Children: N/A  . Years of Education: N/A   Occupational History  . Retired    Social History Main Topics  . Smoking status: Former Smoker -- 2.00 packs/day for 25 years    Types: Pipe, Cigars    Start date: 11/08/1954    Quit date: 10/14/1987  . Smokeless tobacco: Never Used  Comment: About 2 pipes a day  . Alcohol Use: No  . Drug Use: No  . Sexual Activity: Not on file   Other Topics Concern  . Not on file   Social History Narrative     Filed Vitals:   12/20/14 1457  BP: 150/80  Pulse: 65  Height: 5\' 5"  (1.651 m)  Weight: 179 lb (81.194 kg)    PHYSICAL EXAM General: NAD HEENT: Normal. Neck: No JVD, no thyromegaly. Lungs: Clear to auscultation bilaterally with normal respiratory effort. CV: Nondisplaced PMI.  Regular rate and rhythm, normal S1/S2, no S3/S4, no murmur. No pretibial or periankle edema.  No carotid bruit.  Normal pedal pulses.  Abdomen: Soft, nontender, no hepatosplenomegaly, no distention.  Neurologic: Alert and oriented x 3.    Psych: Normal affect. Skin: Normal. Musculoskeletal: Normal range of motion, no gross deformities. Extremities: No clubbing or cyanosis.   ECG: Most recent ECG reviewed.      ASSESSMENT AND PLAN: 1. Chest pain and exertional dyspnea: No marked ECG changes when compared to 02/04/14. Will initiate Imdur 30 mg daily to see if this helps to alleviate symptoms. If no improvement, would consider a Lexiscan Cardiolite stress test as his last one was in 2014. Cardiovascular risk factors include HTN, hyperlipidemia and diabetes. Continue ASA and statin.  2. Palpitations: Infrequent at this time. No therapy indicated.  3. Essential HTN: Mildly elevated on present therapy. As I am adding Imdur, I will continue to monitor this. 4. Prostate cancer: Sees Steve [name]. Completed radiation treatment.  5. Type 2 diabetes: On linagliptin. 6. Hyperlipidemia: On statin therapy.  Dispo: f/u 6 weeks.   Steve [name], M.D., F.A.C.C.

## 2015-01-02 ENCOUNTER — Ambulatory Visit (INDEPENDENT_AMBULATORY_CARE_PROVIDER_SITE_OTHER): Payer: Medicare Other | Admitting: Adult Health

## 2015-01-02 ENCOUNTER — Encounter: Payer: Self-pay | Admitting: Adult Health

## 2015-01-02 VITALS — BP 134/66 | HR 60 | Ht 65.0 in | Wt 181.6 lb

## 2015-01-02 DIAGNOSIS — I10 Essential (primary) hypertension: Secondary | ICD-10-CM | POA: Diagnosis not present

## 2015-01-02 DIAGNOSIS — R519 Headache, unspecified: Secondary | ICD-10-CM

## 2015-01-02 DIAGNOSIS — R51 Headache: Secondary | ICD-10-CM | POA: Diagnosis not present

## 2015-01-02 DIAGNOSIS — R0789 Other chest pain: Secondary | ICD-10-CM

## 2015-01-02 NOTE — Addendum Note (Signed)
Addended by: Levonne Hubert on: 01/02/2015 04:39 PM   Modules accepted: Orders, Medications

## 2015-01-02 NOTE — Progress Notes (Deleted)
Name: Steve [name]    DOB: [birth year]  Age: 79 y.o.  MR#: [id number]       PCP:  Rosita Fire, MD      Insurance: Payor: Holley Bouche MEDICARE / Plan: [employer]ANTRA MEDICARE / Product Type: *No Product type* /   CC:   No chief complaint on file.   VS Filed Vitals:   01/02/15 1322  BP: 134/66  Pulse: 60  Height: 5\' 5"  (1.651 m)  Weight: 181 lb 9.6 oz (82.373 kg)  SpO2: 96%    Weights Current Weight  01/02/15 181 lb 9.6 oz (82.373 kg)  12/20/14 179 lb (81.194 kg)  11/08/14 182 lb (82.555 kg)    Blood Pressure  BP Readings from Last 3 Encounters:  01/02/15 134/66  12/20/14 150/80  11/08/14 142/84     Admit date:  (Not on file) Last encounter with RMR:  Visit date not found   Allergy Review of patient's allergies indicates no known allergies.  Current Outpatient Prescriptions  Medication Sig Dispense Refill  . acetaminophen (TYLENOL) 650 MG CR tablet Take 650 mg by mouth every 8 (eight) hours as needed for pain.    Marland Kitchen aspirin 81 MG tablet Take 81 mg by mouth daily.    . Cholecalciferol (VITAMIN D3) 2000 UNITS capsule Take 2,000 Units by mouth daily.    Marland Kitchen gabapentin (NEURONTIN) 100 MG capsule Take 100 mg by mouth 2 (two) times daily.    . isosorbide mononitrate (IMDUR) 30 MG 24 hr tablet Take 1 tablet (30 mg total) by mouth daily. 90 tablet 3  . linagliptin (TRADJENTA) 5 MG TABS tablet Take 5 mg by mouth daily.    Marland Kitchen losartan (COZAAR) 100 MG tablet Take 1 tablet by mouth daily.    Marland Kitchen NIFEdipine (PROCARDIA XL/ADALAT-CC) 60 MG 24 hr tablet Take 1 tablet by mouth daily.    . nitroGLYCERIN (NITROSTAT) 0.4 MG SL tablet Place 0.4 mg under the tongue every 5 (five) minutes as needed for chest pain.    Marland Kitchen omeprazole (PRILOSEC) 20 MG capsule Take 1 capsule by mouth 2 (two) times daily.    . pravastatin (PRAVACHOL) 40 MG tablet Take 1 tablet by mouth daily.    . tamsulosin (FLOMAX) 0.4 MG CAPS Take 1 capsule by mouth 2 (two) times daily.     . traMADol (ULTRAM) 50 MG tablet Take 50 mg  by mouth 2 (two) times daily.     No current facility-administered medications for this visit.    Discontinued Meds:   There are no discontinued medications.  Patient Active Problem List   Diagnosis Date Noted  . Prostate cancer 07/10/2014  . Abnormal ECG 04/12/2013  . HTN (hypertension) 04/12/2013  . SOB (shortness of breath) 04/12/2013  . CHEST PAIN-UNSPECIFIED 06/12/2009    LABS    Component Value Date/Time   NA 137 02/04/2014 1542   K 3.9 02/04/2014 1542   CL 102 02/04/2014 1542   CO2 21 02/04/2014 1542   GLUCOSE 207* 02/04/2014 1542   BUN 23 02/04/2014 1542   CREATININE 2.41* 02/04/2014 1542   CALCIUM 9.1 02/04/2014 1542   GFRNONAA 25* 02/04/2014 1542   GFRAA 28* 02/04/2014 1542   CMP     Component Value Date/Time   NA 137 02/04/2014 1542   K 3.9 02/04/2014 1542   CL 102 02/04/2014 1542   CO2 21 02/04/2014 1542   GLUCOSE 207* 02/04/2014 1542   BUN 23 02/04/2014 1542   CREATININE 2.41* 02/04/2014 1542   CALCIUM 9.1 02/04/2014 1542  PROT 7.1 02/04/2014 1542   ALBUMIN 3.8 02/04/2014 1542   AST 20 02/04/2014 1542   ALT 17 02/04/2014 1542   ALKPHOS 62 02/04/2014 1542   BILITOT 0.4 02/04/2014 1542   GFRNONAA 25* 02/04/2014 1542   GFRAA 28* 02/04/2014 1542       Component Value Date/Time   WBC 4.3 02/04/2014 1542   HGB 12.5* 02/04/2014 1542   HCT 36.2* 02/04/2014 1542   MCV 75.3* 02/04/2014 1542    Lipid Panel  No results found for: CHOL, TRIG, HDL, CHOLHDL, VLDL, LDLCALC, LDLDIRECT  ABG No results found for: PHART, PCO2ART, PO2ART, HCO3, TCO2, ACIDBASEDEF, O2SAT   No results found for: TSH BNP (last 3 results) No results for input(s): BNP in the last 8760 hours.  ProBNP (last 3 results) No results for input(s): PROBNP in the last 8760 hours.  Cardiac Panel (last 3 results) No results for input(s): CKTOTAL, CKMB, TROPONINI, RELINDX in the last 72 hours.  Iron/TIBC/Ferritin/ %Sat No results found for: IRON, TIBC, FERRITIN, IRONPCTSAT    EKG Orders placed or performed in visit on 12/20/14  . EKG 12-Lead     Prior Assessment and Plan Problem List as of 01/02/2015      Cardiovascular and Mediastinum   HTN (hypertension)     Genitourinary   Prostate cancer     Other   CHEST PAIN-UNSPECIFIED   Abnormal ECG   SOB (shortness of breath)       Imaging: No results found.

## 2015-01-02 NOTE — Progress Notes (Signed)
Cardiology Office Note   Date:  01/02/2015   ID:  Baird Holmon, DOB 1936/06/29, MRN [id number]  PCP:  Rosita Fire, MD  Cardiologist: Michaele Offer, NP   Chief Complaint  Patient presents with  . Hypotension  . Chest Pain      History of Present Illness: Steve [name] is a 79 y.o. male who presents for and assessment and management of hypertension, hypercholesterolemia, with history of prostate cancer treated with radiation therapy, and chronic kidney disease.  The patient has a history of grade 2 diastolic dysfunction.  The patient was last seen by Dr. Bronson Ing on 12/20/2014 at which time.  The patient was complaining of shortness of breath with exertion.  He also felt sort of a "prick sensation" on the left side of his chest when he rolls onto his left side.  He was placed on Imdur 30 mg daily to assess if this helps to alleviate symptoms.  Since beginning Imdur he has been having severe headaches, throbbing pain in his head in the ears, throbbing in his arms and legs. He is also notices blood pressure has decreased significantly with a systolic pressure 0000000, sometimes 100/40.  He states he was dialyzed this morning and he did not take his Cozaar 100 mg.  He comes today concerned about the use of the Imdur.    Past Medical History  Diagnosis Date  . Hypertension   . Diabetes mellitus without complication   . Cancer   . Arthritis     Past Surgical History  Procedure Laterality Date  . Cystectomy       Current Outpatient Prescriptions  Medication Sig Dispense Refill  . acetaminophen (TYLENOL) 650 MG CR tablet Take 650 mg by mouth every 8 (eight) hours as needed for pain.    Marland Kitchen aspirin 81 MG tablet Take 81 mg by mouth daily.    . Cholecalciferol (VITAMIN D3) 2000 UNITS capsule Take 2,000 Units by mouth daily.    Marland Kitchen gabapentin (NEURONTIN) 100 MG capsule Take 100 mg by mouth 2 (two) times daily.    . isosorbide mononitrate (IMDUR) 30 MG 24 hr tablet Take 1  tablet (30 mg total) by mouth daily. 90 tablet 3  . linagliptin (TRADJENTA) 5 MG TABS tablet Take 5 mg by mouth daily.    Marland Kitchen losartan (COZAAR) 100 MG tablet Take 1 tablet by mouth daily.    Marland Kitchen NIFEdipine (PROCARDIA XL/ADALAT-CC) 60 MG 24 hr tablet Take 1 tablet by mouth daily.    . nitroGLYCERIN (NITROSTAT) 0.4 MG SL tablet Place 0.4 mg under the tongue every 5 (five) minutes as needed for chest pain.    Marland Kitchen omeprazole (PRILOSEC) 20 MG capsule Take 1 capsule by mouth 2 (two) times daily.    . pravastatin (PRAVACHOL) 40 MG tablet Take 1 tablet by mouth daily.    . tamsulosin (FLOMAX) 0.4 MG CAPS Take 1 capsule by mouth 2 (two) times daily.     . traMADol (ULTRAM) 50 MG tablet Take 50 mg by mouth 2 (two) times daily.     No current facility-administered medications for this visit.    Allergies:   Review of patient's allergies indicates no known allergies.    Social History:  The patient  reports that he quit smoking about 27 years ago. His smoking use included Pipe and Cigars. He started smoking about 60 years ago. He has never used smokeless tobacco. He reports that he does not drink alcohol or use illicit drugs.   Family History:  The patient's  family history includes Diabetes in his sister; Heart disease in his mother.    ROS: .   All other systems are reviewed and negative.Unless otherwise mentioned in  H&P above.   PHYSICAL EXAM: VS:  BP 134/66 mmHg  Pulse 60  Ht 5\' 5"  (1.651 m)  Wt 181 lb 9.6 oz (82.373 kg)  BMI 30.22 kg/m2  SpO2 96% , BMI Body mass index is 30.22 kg/(m^2). GEN: Well nourished, well developed, in no acute distress HEENT: normal Neck: no JVD, carotid bruits, or masses Cardiac: RRR; no murmurs, rubs, or gallops,no edema  Respiratory:  clear to auscultation bilaterally, normal work of breathing GI: soft, nontender, nondistended, + BS MS: no deformity or atrophy Skin: warm and dry, no rash Neuro:  Strength and sensation are intact Psych: euthymic mood, full  affect  Recent Labs: 02/04/2014: ALT 17; BUN 23; Creatinine 2.41*; Hemoglobin 12.5*; Platelets 171; Potassium 3.9; Sodium 137    Lipid Panel No results found for: CHOL, TRIG, HDL, CHOLHDL, VLDL, LDLCALC, LDLDIRECT    Wt Readings from Last 3 Encounters:  01/02/15 181 lb 9.6 oz (82.373 kg)  12/20/14 179 lb (81.194 kg)  11/08/14 182 lb (82.555 kg)       ASSESSMENT AND PLAN:  1. Hypotension: After being placed on Imdur.  He has noticed his blood pressure is low, he is having some headaches and throbbing pain in the ears and arms. He has been on this medication for approximately 2 weeks, with no improvement in symptoms. I will stop the Imdur to see if he begins to feel better off of it.  I have advised him to take his blood pressure every day, he may notice his blood pressure being a little labile as he weans off of the Imdur.  I have given them parameters, with a blood pressure greater than 150/90.  He will need to take his Cozaar.  He will keep a blood pressure record for a week, and bring it by or call us.  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  In order to save him, money I have advised him that he can call us the results of his blood pressure recording.  He will also let us know how he is feeling off of the nitrates.  I have gone over all of his medications with him.  He will continue to take the Procardia, and Cozaar as directed.  I have given him a sample of Ranexa 1000 mg, which he can take twice a day after weaning off the isosorbide, chest discomfort continues.  If this does occur we will plan a stress test as mentioned by Dr. Bronson Ing on his last note.  2. Essential hypertension: We will reevaluate his blood pressure as discussed above after stopping nitrates. He has a followup appointment with Dr. Bronson Ing in 3 months.  He is advised to keep it.  3. Hyperlipidemia:currently on statin therapy, with pravastatin 40 mg daily.  He will continue  with this medication.  Doubt myalgias causing his symptoms.   Current medicines are reviewed at length with the patient today.    Labs/ tests ordered today include: None No orders of the defined types were placed in this encounter.     Disposition:   FU with Dr. Bronson Ing on previously scheduled appt.             He will keep a record of his daily BP and call us with results in one week.  Signed, Jory Sims, NP  01/02/2015 2:15 PM    [facility] [address] [place], [place] [address] Phone: [phone]; Fax: [phone]

## 2015-01-02 NOTE — Patient Instructions (Signed)
Your physician recommends that you schedule a follow-up appointment in: With Dr. Bronson Ing as scheduled.   Your physician has recommended you make the following change in your medication:   STOP Taking Isosorbide Mononitrate (Imdur) and Losartan (Cozaar)   Your physician has requested that you regularly monitor and record your blood pressure readings at home for one week and then call the office with results. Please use the same machine at the same time of day to check your readings and record them to bring to your follow-up visit.  Notify our office if your blood pressure is above 150/90.   You may start Ranexa 1000 mg 2 times daily on March 30 if you experience chest discomfort  Thank you for choosing [facility]!

## 2015-01-08 ENCOUNTER — Telehealth: Payer: Self-pay | Admitting: Cardiovascular Disease

## 2015-01-08 NOTE — Telephone Encounter (Signed)
Patient is continuing to take his BP readings as directed.  He wants Dr. Raliegh Ip to know that he is doing well.

## 2015-01-09 ENCOUNTER — Other Ambulatory Visit (HOSPITAL_COMMUNITY): Payer: Self-pay | Admitting: Internal Medicine

## 2015-01-09 DIAGNOSIS — R609 Edema, unspecified: Secondary | ICD-10-CM

## 2015-01-09 NOTE — Telephone Encounter (Signed)
Recent BP readings:  149/71 135/65 129/62 124/61 126/63 140/58 133/61 140/77 160/75

## 2015-01-10 ENCOUNTER — Ambulatory Visit (HOSPITAL_COMMUNITY)
Admission: RE | Admit: 2015-01-10 | Discharge: 2015-01-10 | Disposition: A | Discharge status: Home / Self Care | Payer: Medicare Other | Source: Ambulatory Visit | Attending: Internal Medicine | Admitting: Internal Medicine

## 2015-01-10 DIAGNOSIS — N281 Cyst of kidney, acquired: Secondary | ICD-10-CM | POA: Insufficient documentation

## 2015-01-10 DIAGNOSIS — E119 Type 2 diabetes mellitus without complications: Secondary | ICD-10-CM | POA: Diagnosis not present

## 2015-01-10 DIAGNOSIS — I10 Essential (primary) hypertension: Secondary | ICD-10-CM | POA: Diagnosis not present

## 2015-01-10 DIAGNOSIS — R609 Edema, unspecified: Secondary | ICD-10-CM

## 2015-01-10 DIAGNOSIS — R1900 Intra-abdominal and pelvic swelling, mass and lump, unspecified site: Secondary | ICD-10-CM | POA: Insufficient documentation

## 2015-02-05 ENCOUNTER — Ambulatory Visit (INDEPENDENT_AMBULATORY_CARE_PROVIDER_SITE_OTHER): Payer: Medicare Other | Admitting: Cardiovascular Disease

## 2015-02-05 ENCOUNTER — Encounter: Payer: Self-pay | Admitting: Cardiovascular Disease

## 2015-02-05 VITALS — BP 165/67 | HR 64 | Ht 65.0 in | Wt 181.0 lb

## 2015-02-05 DIAGNOSIS — R002 Palpitations: Secondary | ICD-10-CM

## 2015-02-05 DIAGNOSIS — R0602 Shortness of breath: Secondary | ICD-10-CM | POA: Diagnosis not present

## 2015-02-05 DIAGNOSIS — I10 Essential (primary) hypertension: Secondary | ICD-10-CM | POA: Diagnosis not present

## 2015-02-05 DIAGNOSIS — R0789 Other chest pain: Secondary | ICD-10-CM | POA: Diagnosis not present

## 2015-02-05 DIAGNOSIS — E785 Hyperlipidemia, unspecified: Secondary | ICD-10-CM

## 2015-02-05 DIAGNOSIS — E1121 Type 2 diabetes mellitus with diabetic nephropathy: Secondary | ICD-10-CM

## 2015-02-05 MED ORDER — NIFEDIPINE ER OSMOTIC RELEASE 90 MG PO TB24: 90.0000 mg | ORAL_TABLET | Freq: Every day | ORAL | Status: DC

## 2015-02-05 NOTE — Addendum Note (Signed)
Addended by: Laurine Blazer on: 02/05/2015 01:58 PM   Modules accepted: Orders, Level of Service

## 2015-02-05 NOTE — Patient Instructions (Signed)
   Increase Nifedipine to 90mg  daily - new sent to pharm today. Continue all other medications.   Your physician wants you to follow up in: 6 months.  You will receive a reminder letter in the mail one-two months in advance.  If you don't receive a letter, please call our office to schedule the follow up appointment

## 2015-02-05 NOTE — Progress Notes (Signed)
Patient ID: Elisee Hoopes, male   DOB: 1935/12/28, 79 y.o.   MRN: [id number]      SUBJECTIVE: The patient returns for follow-up of chest pain and exertional dyspnea. He did not tolerate Imdur as it caused severe headaches and lowered his blood pressure. He was given a prescription for Ranexa.  He has had no more chest pain and his breathing has improved. He had been struggling with seasonal allergies and wondered if his shortness of breath was somehow related to this. He has had spikes of high blood pressure up to 215/100. He denies increased sodium use. Overall his blood pressure has been better controlled and he has been monitoring it.   Review of Systems: As per "subjective", otherwise negative.  No Known Allergies  Current Outpatient Prescriptions  Medication Sig Dispense Refill  . acetaminophen (TYLENOL) 650 MG CR tablet Take 650 mg by mouth every 8 (eight) hours as needed for pain.    Marland Kitchen ALPRAZolam (XANAX) 0.5 MG tablet Take 1 tablet by mouth at bedtime.    Marland Kitchen aspirin 81 MG tablet Take 81 mg by mouth daily.    Marland Kitchen gabapentin (NEURONTIN) 300 MG capsule Take 1 capsule by mouth 2 (two) times daily.    Marland Kitchen linagliptin (TRADJENTA) 5 MG TABS tablet Take 5 mg by mouth daily.    Marland Kitchen losartan (COZAAR) 100 MG tablet Take 1 tablet by mouth daily.    Marland Kitchen NIFEdipine (PROCARDIA XL/ADALAT-CC) 60 MG 24 hr tablet Take 1 tablet by mouth daily.    . nitroGLYCERIN (NITROSTAT) 0.4 MG SL tablet Place 0.4 mg under the tongue every 5 (five) minutes as needed for chest pain.    Marland Kitchen omeprazole (PRILOSEC) 20 MG capsule Take 1 capsule by mouth 2 (two) times daily.    . pravastatin (PRAVACHOL) 40 MG tablet Take 1 tablet by mouth daily.    . tamsulosin (FLOMAX) 0.4 MG CAPS Take 1 capsule by mouth 2 (two) times daily.     . traMADol (ULTRAM) 50 MG tablet Take 50 mg by mouth 2 (two) times daily.     No current facility-administered medications for this visit.    Past Medical History  Diagnosis Date  . Hypertension   .  Diabetes mellitus without complication   . Cancer   . Arthritis     Past Surgical History  Procedure Laterality Date  . Cystectomy      History   Social History  . Marital Status: Divorced    Spouse Name: N/A  . Number of Children: N/A  . Years of Education: N/A   Occupational History  . Retired    Social History Main Topics  . Smoking status: Former Smoker -- 2.00 packs/day for 25 years    Types: Pipe, Cigars    Start date: 11/08/1954    Quit date: 10/14/1987  . Smokeless tobacco: Never Used     Comment: About 2 pipes a day  . Alcohol Use: No  . Drug Use: No  . Sexual Activity: Not on file   Other Topics Concern  . Not on file   Social History Narrative     Filed Vitals:   02/05/15 1318  BP: 165/67  Pulse: 64  Height: 5\' 5"  (1.651 m)  Weight: 181 lb (82.101 kg)    PHYSICAL EXAM General: NAD HEENT: Normal. Neck: No JVD, no thyromegaly. Lungs: Clear to auscultation bilaterally with normal respiratory effort. CV: Nondisplaced PMI.  Regular rate and rhythm, normal S1/S2, no S3/S4, no murmur. No pretibial or periankle edema.  No carotid bruit.  Normal pedal pulses.  Abdomen: Soft, nontender, no distention.  Neurologic: Alert and oriented.  Psych: Normal affect. Skin: Normal. Musculoskeletal: Normal range of motion, no gross deformities. Extremities: No clubbing or cyanosis.   ECG: Most recent ECG reviewed.      ASSESSMENT AND PLAN: 1. Chest pain and exertional dyspnea: Symptoms have improved. Did not tolerate Imdur. Has Ranexa but has not taken any. If symptoms recur, would consider Lexiscan Cardiolite stress test as his last one was in 2014. Cardiovascular risk factors include HTN, hyperlipidemia and diabetes. Continue ASA and statin.  2. Palpitations: Infrequent at this time. No therapy indicated.  3. Essential HTN: Elevated on present therapy. Increase nifedipine to 90 mg. 4. Prostate cancer: Sees Dr. Exie Parody. Completed radiation treatment.  5.  Type 2 diabetes: On linagliptin. 6. Hyperlipidemia: On statin therapy.  Dispo: f/u 6 months.   Kate Sable, M.D., F.A.C.C.

## 2015-08-02 ENCOUNTER — Other Ambulatory Visit (HOSPITAL_COMMUNITY): Payer: Self-pay | Admitting: Internal Medicine

## 2015-08-02 ENCOUNTER — Ambulatory Visit (HOSPITAL_COMMUNITY)
Admission: RE | Admit: 2015-08-02 | Discharge: 2015-08-02 | Disposition: A | Discharge status: Home / Self Care | Payer: Medicare Other | Source: Ambulatory Visit | Attending: Internal Medicine | Admitting: Internal Medicine

## 2015-08-02 DIAGNOSIS — R0602 Shortness of breath: Secondary | ICD-10-CM | POA: Diagnosis not present

## 2015-08-02 DIAGNOSIS — R0789 Other chest pain: Secondary | ICD-10-CM

## 2015-08-02 DIAGNOSIS — Z87891 Personal history of nicotine dependence: Secondary | ICD-10-CM | POA: Diagnosis not present

## 2015-08-02 DIAGNOSIS — I517 Cardiomegaly: Secondary | ICD-10-CM | POA: Diagnosis not present

## 2015-08-02 DIAGNOSIS — E119 Type 2 diabetes mellitus without complications: Secondary | ICD-10-CM | POA: Diagnosis not present

## 2015-08-02 DIAGNOSIS — I10 Essential (primary) hypertension: Secondary | ICD-10-CM | POA: Diagnosis not present

## 2015-08-02 DIAGNOSIS — R079 Chest pain, unspecified: Secondary | ICD-10-CM | POA: Insufficient documentation

## 2015-08-20 ENCOUNTER — Encounter: Payer: Self-pay | Admitting: *Deleted

## 2015-08-20 ENCOUNTER — Ambulatory Visit (INDEPENDENT_AMBULATORY_CARE_PROVIDER_SITE_OTHER): Payer: Medicare Other | Admitting: Cardiovascular Disease

## 2015-08-20 ENCOUNTER — Encounter: Payer: Self-pay | Admitting: Cardiovascular Disease

## 2015-08-20 VITALS — BP 120/64 | HR 77 | Ht 65.0 in | Wt 179.0 lb

## 2015-08-20 DIAGNOSIS — R0602 Shortness of breath: Secondary | ICD-10-CM

## 2015-08-20 DIAGNOSIS — E785 Hyperlipidemia, unspecified: Secondary | ICD-10-CM | POA: Diagnosis not present

## 2015-08-20 DIAGNOSIS — I10 Essential (primary) hypertension: Secondary | ICD-10-CM | POA: Diagnosis not present

## 2015-08-20 MED ORDER — RANOLAZINE ER 500 MG PO TB12: 500.0000 mg | ORAL_TABLET | Freq: Two times a day (BID) | ORAL | Status: DC

## 2015-08-20 NOTE — Progress Notes (Signed)
Patient ID: Steve [name], male   DOB: [birth year], 79 y.o.   MRN: [id number]      SUBJECTIVE: The patient returns for follow-up of exertional dyspnea. He continues to have intermittent shortness of breath which is not always consistent with activity. He denies chest pain. He also denies palpitations and leg swelling. He had some pulmonary studies done by his PCP and it is unclear to me if this was just a chest x-ray or spirometry as well. He quit smoking several years ago. He sometimes gets short of breath at night and wakes up short of breath.      Review of Systems: As per "subjective", otherwise negative.  No Known Allergies  Current Outpatient Prescriptions  Medication Sig Dispense Refill  . acetaminophen (TYLENOL) 650 MG CR tablet Take 650 mg by mouth every 8 (eight) hours as needed for pain.    Marland Kitchen ALPRAZolam (XANAX) 0.5 MG tablet Take 1 tablet by mouth at bedtime.    Marland Kitchen aspirin 81 MG tablet Take 81 mg by mouth daily.    . Cholecalciferol (VITAMIN D-3) 1000 UNITS CAPS Take by mouth daily.    . furosemide (LASIX) 20 MG tablet Take 20 mg by mouth daily.    Marland Kitchen gabapentin (NEURONTIN) 300 MG capsule Take 1 capsule by mouth 2 (two) times daily.    Marland Kitchen linagliptin (TRADJENTA) 5 MG TABS tablet Take 5 mg by mouth daily.    Marland Kitchen losartan (COZAAR) 100 MG tablet Take 1 tablet by mouth daily.    Marland Kitchen NIFEdipine (PROCARDIA XL/ADALAT-CC) 90 MG 24 hr tablet Take 1 tablet (90 mg total) by mouth daily. 30 tablet 6  . nitroGLYCERIN (NITROSTAT) 0.4 MG SL tablet Place 0.4 mg under the tongue every 5 (five) minutes as needed for chest pain.    Marland Kitchen omeprazole (PRILOSEC) 20 MG capsule Take 1 capsule by mouth 2 (two) times daily.    . pravastatin (PRAVACHOL) 40 MG tablet Take 1 tablet by mouth daily.    . tamsulosin (FLOMAX) 0.4 MG CAPS Take 1 capsule by mouth 2 (two) times daily.     . traMADol (ULTRAM) 50 MG tablet Take 50 mg by mouth 2 (two) times daily.     No current facility-administered medications for this  visit.    Past Medical History  Diagnosis Date  . Hypertension   . Diabetes mellitus without complication ([place])   . Cancer ([place])   . Arthritis     Past Surgical History  Procedure Laterality Date  . Cystectomy      Social History   Social History  . Marital Status: Divorced    Spouse Name: N/A  . Number of Children: N/A  . Years of Education: N/A   Occupational History  . Retired    Social History Main Topics  . Smoking status: Former Smoker -- 2.00 packs/day for 25 years    Types: Pipe, Cigars    Start date: 11/08/1954    Quit date: 10/14/1987  . Smokeless tobacco: Never Used     Comment: About 2 pipes a day  . Alcohol Use: No  . Drug Use: No  . Sexual Activity: Not on file   Other Topics Concern  . Not on file   Social History Narrative     Filed Vitals:   08/20/15 0907  BP: 120/64  Pulse: 77  Height: 5\' 5"  (1.651 m)  Weight: 179 lb (81.194 kg)  SpO2: 98%    PHYSICAL EXAM General: NAD HEENT: Normal. Neck: No JVD, no thyromegaly. Lungs:  Clear to auscultation bilaterally with normal respiratory effort. CV: Nondisplaced PMI.  Regular rate and rhythm, normal S1/S2, no S3/S4, no murmur. No pretibial or periankle edema.     Abdomen: Soft, nontender, no hepatosplenomegaly, no distention.  Neurologic: Alert and oriented x 3.  Psych: Normal affect. Skin: Normal. Musculoskeletal: Normal range of motion, no gross deformities. Extremities: No clubbing or cyanosis.   ECG: Most recent ECG reviewed.      ASSESSMENT AND PLAN: 1. Shortness of breath: Did not tolerate Imdur. I will give a two week supply of Ranexa 500 mg bid to see if this helps to alleviate his symptoms. Will also obtain an exercise Cardiolite stress test as his last one was in 2014. Cardiovascular risk factors include HTN, hyperlipidemia and diabetes. Continue ASA and statin.  Will obtain records from PCP.  2. Essential HTN: Controlled on present therapy. No changes.  3. Prostate  cancer: Sees Dr. Exie Parody. Completed radiation treatment.   4. Type 2 diabetes: On linagliptin.  5. Hyperlipidemia: On statin therapy.  Dispo: f/u 6 weeks.   Kate Sable, M.D., F.A.C.C.

## 2015-08-20 NOTE — Addendum Note (Signed)
Addended by: Laurine Blazer on: 08/20/2015 10:45 AM   Modules accepted: Orders

## 2015-08-20 NOTE — Patient Instructions (Signed)
Your physician has requested that you have a lexiscan myoview. For further information please visit [email or web address]. Please follow instruction sheet, as given. Office will contact with results via phone or letter.   Continue all current medications. Begin Ranexa 500mg  twice a day  - 2 weeks of samples provided, call office if helping & will send prescription to pharmacy.   Continue all other medications.   Follow up in  6 weeks

## 2015-08-21 ENCOUNTER — Telehealth: Payer: Self-pay | Admitting: Cardiovascular Disease

## 2015-08-21 NOTE — Telephone Encounter (Signed)
Lexiscan for SOB/Steve [name]/srs  Scheduled Jan 3rd at [facility]

## 2015-08-30 ENCOUNTER — Telehealth: Payer: Self-pay | Admitting: *Deleted

## 2015-08-30 NOTE — Telephone Encounter (Signed)
-----   Message from Laurine Blazer, LPN sent at [id number]  9:52 AM EST ----- Regarding: follow up call to patient  2 weeks worth Ranexa samples - need to call for update.  Send new rx to pharm.

## 2015-08-30 NOTE — Telephone Encounter (Signed)
Left message to return call 

## 2015-09-03 MED ORDER — RANOLAZINE ER 500 MG PO TB12: 500.0000 mg | ORAL_TABLET | Freq: Two times a day (BID) | ORAL | Status: DC

## 2015-09-03 NOTE — Telephone Encounter (Signed)
Called patient again this morning - stated he is doing fine.  Everything was okay with new medication.  New rx sent to [place].

## 2015-09-10 ENCOUNTER — Other Ambulatory Visit: Payer: Self-pay | Admitting: Cardiovascular Disease

## 2015-10-15 ENCOUNTER — Encounter (HOSPITAL_COMMUNITY): Payer: Medicare Other

## 2015-10-16 ENCOUNTER — Encounter (HOSPITAL_COMMUNITY): Payer: PPO

## 2015-10-16 ENCOUNTER — Inpatient Hospital Stay (HOSPITAL_COMMUNITY): Admission: RE | Admit: 2015-10-16 | Payer: Medicare Other | Source: Ambulatory Visit

## 2015-10-16 ENCOUNTER — Encounter (HOSPITAL_COMMUNITY): Admission: RE | Admit: 2015-10-16 | Payer: PPO | Source: Ambulatory Visit

## 2015-10-22 ENCOUNTER — Inpatient Hospital Stay (HOSPITAL_COMMUNITY): Admission: RE | Admit: 2015-10-22 | Payer: Medicare Other | Source: Ambulatory Visit

## 2015-10-22 ENCOUNTER — Encounter (HOSPITAL_COMMUNITY): Payer: PPO

## 2015-10-29 ENCOUNTER — Ambulatory Visit: Payer: Medicare Other | Admitting: Cardiovascular Disease

## 2015-10-30 ENCOUNTER — Encounter (HOSPITAL_COMMUNITY): Payer: PPO

## 2015-10-30 ENCOUNTER — Encounter (HOSPITAL_COMMUNITY)
Admission: RE | Admit: 2015-10-30 | Discharge: 2015-10-30 | Disposition: A | Discharge status: Home / Self Care | Payer: PPO | Source: Ambulatory Visit | Attending: Cardiovascular Disease | Admitting: Cardiovascular Disease

## 2015-10-30 ENCOUNTER — Encounter (HOSPITAL_COMMUNITY): Payer: Self-pay

## 2015-10-30 ENCOUNTER — Telehealth: Payer: Self-pay | Admitting: *Deleted

## 2015-10-30 DIAGNOSIS — R0602 Shortness of breath: Secondary | ICD-10-CM | POA: Insufficient documentation

## 2015-10-30 LAB — NM MYOCAR MULTI W/SPECT W/WALL MOTION / EF
Estimated workload: 1 METS
Exercise duration (min): 4 min
Exercise duration (sec): 10 s
LV dias vol: 68 mL
LV sys vol: 68 mL
Peak HR: 89 {beats}/min
RATE: 0.33
Rest HR: 66 {beats}/min
SDS: 1
SRS: 0
SSS: 1
TID: 1.03

## 2015-10-30 MED ORDER — TECHNETIUM TC 99M SESTAMIBI - CARDIOLITE
30.0000 | Freq: Once | INTRAVENOUS | Status: AC | PRN
  Administered 2015-10-30: 11:00:00 30 via INTRAVENOUS

## 2015-10-30 MED ORDER — TECHNETIUM TC 99M SESTAMIBI GENERIC - CARDIOLITE
10.0000 | Freq: Once | INTRAVENOUS | Status: AC | PRN
  Administered 2015-10-30: 10 via INTRAVENOUS

## 2015-10-30 MED ORDER — SODIUM CHLORIDE 0.9 % IJ SOLN
INTRAMUSCULAR | Status: AC
  Administered 2015-10-30: 10 mL via INTRAVENOUS
  Filled 2015-10-30: qty 3

## 2015-10-30 MED ORDER — REGADENOSON 0.4 MG/5ML IV SOLN
INTRAVENOUS | Status: AC
  Administered 2015-10-30: 0.4 mg via INTRAVENOUS
  Filled 2015-10-30: qty 5

## 2015-10-30 NOTE — Telephone Encounter (Signed)
-----   Message from Herminio Commons, MD sent at 10/30/2015  3:20 PM EST ----- Low risk.

## 2015-10-30 NOTE — Telephone Encounter (Signed)
Notes Recorded by Laurine Blazer, LPN on [id number] at [id number] PM Patient notified. Copy fwd to pmd. Already has f/u scheduled for tomorrow.

## 2015-11-02 ENCOUNTER — Ambulatory Visit (INDEPENDENT_AMBULATORY_CARE_PROVIDER_SITE_OTHER): Payer: PPO | Admitting: Cardiovascular Disease

## 2015-11-02 ENCOUNTER — Encounter: Payer: Self-pay | Admitting: Cardiovascular Disease

## 2015-11-02 VITALS — BP 149/75 | HR 84 | Ht 65.0 in | Wt 179.0 lb

## 2015-11-02 DIAGNOSIS — R0602 Shortness of breath: Secondary | ICD-10-CM

## 2015-11-02 DIAGNOSIS — I10 Essential (primary) hypertension: Secondary | ICD-10-CM

## 2015-11-02 DIAGNOSIS — E1121 Type 2 diabetes mellitus with diabetic nephropathy: Secondary | ICD-10-CM

## 2015-11-02 DIAGNOSIS — E785 Hyperlipidemia, unspecified: Secondary | ICD-10-CM | POA: Diagnosis not present

## 2015-11-02 NOTE — Progress Notes (Signed)
Patient ID: Steve [name], male   DOB: [birth year], 80 y.o.   MRN: [id number]      SUBJECTIVE: The patient returns for follow-up after undergoing cardiovascular testing performed for the evaluation of shortness of breath.  Nuclear stress test on 1/17 was low risk with a possible trivial region of anteroapical ischemia with no large ischemic territories or myocardial scar seen.  Has some exertional dyspnea as well as when he lies down. Also complains of inability to breathe through nose.  Review of Systems: As per "subjective", otherwise negative.  No Known Allergies  Current Outpatient Prescriptions  Medication Sig Dispense Refill  . acetaminophen (TYLENOL) 650 MG CR tablet Take 650 mg by mouth every 8 (eight) hours as needed for pain.    Marland Kitchen ALPRAZolam (XANAX) 0.5 MG tablet Take 1 tablet by mouth at bedtime.    Marland Kitchen aspirin 81 MG tablet Take 81 mg by mouth daily.    . Cholecalciferol (VITAMIN D-3) 1000 UNITS CAPS Take by mouth daily.    . furosemide (LASIX) 20 MG tablet Take 20 mg by mouth daily.    Marland Kitchen gabapentin (NEURONTIN) 300 MG capsule Take 1 capsule by mouth 2 (two) times daily.    Marland Kitchen linagliptin (TRADJENTA) 5 MG TABS tablet Take 5 mg by mouth daily.    Marland Kitchen losartan (COZAAR) 100 MG tablet Take 1 tablet by mouth daily.    Marland Kitchen NIFEdipine (PROCARDIA XL/ADALAT-CC) 90 MG 24 hr tablet TAKE 1 TABLET ONCE DAILY. 30 tablet 3  . nitroGLYCERIN (NITROSTAT) 0.4 MG SL tablet Place 0.4 mg under the tongue every 5 (five) minutes as needed for chest pain.    Marland Kitchen omeprazole (PRILOSEC) 20 MG capsule Take 1 capsule by mouth 2 (two) times daily.    . pravastatin (PRAVACHOL) 40 MG tablet Take 1 tablet by mouth daily.    . tamsulosin (FLOMAX) 0.4 MG CAPS Take 1 capsule by mouth 2 (two) times daily.     . traMADol (ULTRAM) 50 MG tablet Take 50 mg by mouth 2 (two) times daily.     No current facility-administered medications for this visit.    Past Medical History  Diagnosis Date  . Hypertension   . Diabetes  mellitus without complication ([place])   . Cancer ([place])   . Arthritis     Past Surgical History  Procedure Laterality Date  . Cystectomy      Social History   Social History  . Marital Status: Divorced    Spouse Name: N/A  . Number of Children: N/A  . Years of Education: N/A   Occupational History  . Retired    Social History Main Topics  . Smoking status: Former Smoker -- 2.00 packs/day for 25 years    Types: Pipe, Cigars    Start date: 11/08/1954    Quit date: 10/14/1987  . Smokeless tobacco: Never Used     Comment: About 2 pipes a day  . Alcohol Use: No  . Drug Use: No  . Sexual Activity: Not on file   Other Topics Concern  . Not on file   Social History Narrative     Filed Vitals:   11/02/15 0829  BP: 149/75  Pulse: 84  Height: 5\' 5"  (1.651 m)  Weight: 179 lb (81.194 kg)    PHYSICAL EXAM General: NAD HEENT: Normal. Neck: No JVD, no thyromegaly. Lungs: Clear to auscultation bilaterally with normal respiratory effort. CV: Nondisplaced PMI.  Regular rate and rhythm, normal S1/S2, no S3/S4, no murmur. No pretibial or periankle edema.  Abdomen: Soft, nontender, no distention.  Neurologic: Alert and oriented x 3.  Psych: Normal affect. Skin: Normal. Musculoskeletal: No gross deformities. Extremities: No clubbing or cyanosis.   ECG: Most recent ECG reviewed.      ASSESSMENT AND PLAN: 1. Shortness of breath: No improvement with Ranexa. Exercise Cardiolite stress test was low risk. Cardiovascular risk factors include HTN, hyperlipidemia and diabetes. Continue ASA and statin.  Chest xray unremarkable in 07/2015. Will obtain spirometry.   2. Essential HTN: Controlled at last visit, mildly elevated today. No changes.  3. Prostate cancer: Sees Dr. Exie Parody. Completed radiation treatment.   4. Type 2 diabetes: On linagliptin.  5. Hyperlipidemia: On statin therapy.  Dispo: f/u 3 months.   Kate Sable, M.D., F.A.C.C.

## 2015-11-02 NOTE — Patient Instructions (Signed)
Your physician recommends that you schedule a follow-up appointment in: [place]   Your physician recommends that you continue on your current medications as directed. Please refer to the Current Medication list given to you today.  Your physician has recommended that you have a pulmonary function test. Pulmonary Function Tests are a group of tests that measure how well air moves in and out of your lungs.  Thank you for choosing [facility]!!

## 2015-11-05 LAB — PULMONARY FUNCTION TEST

## 2015-11-20 ENCOUNTER — Encounter: Payer: Self-pay | Admitting: *Deleted

## 2015-12-04 ENCOUNTER — Other Ambulatory Visit: Payer: Self-pay | Admitting: *Deleted

## 2015-12-04 DIAGNOSIS — R0602 Shortness of breath: Secondary | ICD-10-CM

## 2016-01-08 ENCOUNTER — Other Ambulatory Visit: Payer: Self-pay | Admitting: Cardiovascular Disease

## 2016-02-04 ENCOUNTER — Ambulatory Visit: Payer: PPO | Admitting: Cardiovascular Disease

## 2016-02-12 ENCOUNTER — Encounter: Payer: Self-pay | Admitting: Cardiovascular Disease

## 2016-02-12 ENCOUNTER — Ambulatory Visit (INDEPENDENT_AMBULATORY_CARE_PROVIDER_SITE_OTHER): Payer: PPO | Admitting: Cardiovascular Disease

## 2016-02-12 VITALS — BP 122/64 | HR 56 | Ht 65.0 in | Wt 181.0 lb

## 2016-02-12 DIAGNOSIS — E785 Hyperlipidemia, unspecified: Secondary | ICD-10-CM | POA: Diagnosis not present

## 2016-02-12 DIAGNOSIS — R0602 Shortness of breath: Secondary | ICD-10-CM | POA: Diagnosis not present

## 2016-02-12 DIAGNOSIS — I10 Essential (primary) hypertension: Secondary | ICD-10-CM

## 2016-02-12 DIAGNOSIS — R079 Chest pain, unspecified: Secondary | ICD-10-CM

## 2016-02-12 DIAGNOSIS — E1121 Type 2 diabetes mellitus with diabetic nephropathy: Secondary | ICD-10-CM

## 2016-02-12 NOTE — Patient Instructions (Signed)
Continue all current medications. Your physician wants you to follow up in: 6 months.  You will receive a reminder letter in the mail one-two months in advance.  If you don't receive a letter, please call our office to schedule the follow up appointment   

## 2016-02-12 NOTE — Progress Notes (Signed)
Patient ID: Steve [name], male   DOB: 1936-05-15, 80 y.o.   MRN: [id number]      SUBJECTIVE: The patient returns for follow up of shortness of breath. Symptoms have improved overall. Denies chest pain. Has used albuterol prn with relief.  Nuclear stress test on 1/17 was low risk with a possible trivial region of anteroapical ischemia with no large ischemic territories or myocardial scar seen.  ECG performed in the office today which I personally interpreted demonstrated sinus bradycardia with inferolateral T wave inversions.   Review of Systems: As per "subjective", otherwise negative.  No Known Allergies  Current Outpatient Prescriptions  Medication Sig Dispense Refill  . acetaminophen (TYLENOL) 650 MG CR tablet Take 650 mg by mouth every 8 (eight) hours as needed for pain.    Marland Kitchen albuterol (PROVENTIL HFA;VENTOLIN HFA) 108 (90 Base) MCG/ACT inhaler Inhale 1-2 puffs into the lungs every 6 (six) hours as needed for wheezing or shortness of breath.    . ALPRAZolam (XANAX) 0.5 MG tablet Take 1 tablet by mouth at bedtime.    Marland Kitchen aspirin 81 MG tablet Take 81 mg by mouth daily.    . Cholecalciferol (VITAMIN D-3) 1000 UNITS CAPS Take by mouth daily.    . furosemide (LASIX) 20 MG tablet Take 20 mg by mouth daily.    Marland Kitchen gabapentin (NEURONTIN) 300 MG capsule Take 1 capsule by mouth 2 (two) times daily.    Marland Kitchen linagliptin (TRADJENTA) 5 MG TABS tablet Take 5 mg by mouth daily.    Marland Kitchen losartan (COZAAR) 100 MG tablet Take 1 tablet by mouth daily.    Marland Kitchen NIFEdipine (PROCARDIA XL/ADALAT-CC) 90 MG 24 hr tablet TAKE 1 TABLET ONCE DAILY. 30 tablet 6  . nitroGLYCERIN (NITROSTAT) 0.4 MG SL tablet Place 0.4 mg under the tongue every 5 (five) minutes as needed for chest pain.    Marland Kitchen omeprazole (PRILOSEC) 20 MG capsule Take 1 capsule by mouth 2 (two) times daily.    . pravastatin (PRAVACHOL) 40 MG tablet Take 1 tablet by mouth daily.    . tamsulosin (FLOMAX) 0.4 MG CAPS Take 1 capsule by mouth 2 (two) times daily.     .  traMADol (ULTRAM) 50 MG tablet Take 50 mg by mouth 2 (two) times daily.     No current facility-administered medications for this visit.    Past Medical History  Diagnosis Date  . Hypertension   . Diabetes mellitus without complication ([place])   . Cancer ([place])   . Arthritis     Past Surgical History  Procedure Laterality Date  . Cystectomy      Social History   Social History  . Marital Status: Divorced    Spouse Name: N/A  . Number of Children: N/A  . Years of Education: N/A   Occupational History  . Retired    Social History Main Topics  . Smoking status: Former Smoker -- 2.00 packs/day for 25 years    Types: Pipe, Cigars    Start date: 11/08/1954    Quit date: 10/14/1987  . Smokeless tobacco: Never Used     Comment: About 2 pipes a day  . Alcohol Use: No  . Drug Use: No  . Sexual Activity: Not on file   Other Topics Concern  . Not on file   Social History Narrative     Filed Vitals:   02/12/16 1001  BP: 122/64  Pulse: 56  Height: 5\' 5"  (1.651 m)  Weight: 181 lb (82.101 kg)  SpO2: 96%  PHYSICAL EXAM General: NAD HEENT: Normal. Neck: No JVD, no thyromegaly. Lungs: Clear to auscultation bilaterally with normal respiratory effort. CV: Nondisplaced PMI.  Regular rate and rhythm, normal S1/S2, no S3/S4, no murmur. No pretibial or periankle edema.   Abdomen: Soft, nontender, no distention.  Neurologic: Alert and oriented.  Psych: Normal affect. Skin: Normal. Musculoskeletal: No gross deformities.  ECG: Most recent ECG reviewed.      ASSESSMENT AND PLAN: 1. Shortness of breath: Symptomatically improved. Exercise Cardiolite stress test was low risk. Cardiovascular risk factors include HTN, hyperlipidemia and diabetes. ECG is abnormal. Continue ASA and statin.  Chest xray unremarkable in 07/2015. Spirometry also normal. If symptoms were to get worse, would consider coronary angiography.  2. Essential HTN: Controlled. No changes.  3. Prostate  cancer: Sees Dr. Exie Parody. Completed radiation treatment.   4. Type 2 diabetes: On linagliptin.  5. Hyperlipidemia: On statin therapy.  Dispo: f/u 6 months.   Kate Sable, M.D., F.A.C.C.

## 2016-03-27 ENCOUNTER — Encounter (HOSPITAL_COMMUNITY): Payer: Self-pay | Admitting: *Deleted

## 2016-03-27 DIAGNOSIS — M1990 Unspecified osteoarthritis, unspecified site: Secondary | ICD-10-CM | POA: Insufficient documentation

## 2016-03-27 DIAGNOSIS — I10 Essential (primary) hypertension: Secondary | ICD-10-CM | POA: Insufficient documentation

## 2016-03-27 DIAGNOSIS — Z87891 Personal history of nicotine dependence: Secondary | ICD-10-CM | POA: Insufficient documentation

## 2016-03-27 DIAGNOSIS — N50811 Right testicular pain: Secondary | ICD-10-CM | POA: Insufficient documentation

## 2016-03-27 DIAGNOSIS — Z79899 Other long term (current) drug therapy: Secondary | ICD-10-CM | POA: Insufficient documentation

## 2016-03-27 DIAGNOSIS — R1030 Lower abdominal pain, unspecified: Secondary | ICD-10-CM | POA: Diagnosis not present

## 2016-03-27 DIAGNOSIS — Z7982 Long term (current) use of aspirin: Secondary | ICD-10-CM | POA: Diagnosis not present

## 2016-03-27 DIAGNOSIS — E119 Type 2 diabetes mellitus without complications: Secondary | ICD-10-CM | POA: Insufficient documentation

## 2016-03-27 NOTE — ED Notes (Signed)
Pt c/o  Lower abd pain that started a few days ago, denies any urinary symptoms, denies any n/v/d

## 2016-03-28 ENCOUNTER — Emergency Department (HOSPITAL_COMMUNITY)
Admission: EM | Admit: 2016-03-28 | Discharge: 2016-03-28 | Disposition: A | Discharge status: Home / Self Care | Payer: PPO | Attending: Emergency Medicine | Admitting: Emergency Medicine

## 2016-03-28 ENCOUNTER — Emergency Department (HOSPITAL_COMMUNITY): Payer: PPO

## 2016-03-28 DIAGNOSIS — N50811 Right testicular pain: Secondary | ICD-10-CM

## 2016-03-28 DIAGNOSIS — R109 Unspecified abdominal pain: Secondary | ICD-10-CM

## 2016-03-28 LAB — CBC WITH DIFFERENTIAL/PLATELET
Basophils Absolute: 0 10*3/uL (ref 0.0–0.1)
Basophils Relative: 1 %
Eosinophils Absolute: 0.1 10*3/uL (ref 0.0–0.7)
Eosinophils Relative: 1 %
HCT: 35.7 % — ABNORMAL LOW (ref 39.0–52.0)
Hemoglobin: 12.3 g/dL — ABNORMAL LOW (ref 13.0–17.0)
Lymphocytes Relative: 20 %
Lymphs Abs: 1.1 10*3/uL (ref 0.7–4.0)
MCH: 26.6 pg (ref 26.0–34.0)
MCHC: 34.5 g/dL (ref 30.0–36.0)
MCV: 77.3 fL — ABNORMAL LOW (ref 78.0–100.0)
Monocytes Absolute: 0.8 10*3/uL (ref 0.1–1.0)
Monocytes Relative: 14 %
Neutro Abs: 3.6 10*3/uL (ref 1.7–7.7)
Neutrophils Relative %: 64 %
Platelets: 198 10*3/uL (ref 150–400)
RBC: 4.62 MIL/uL (ref 4.22–5.81)
RDW: 12.9 % (ref 11.5–15.5)
WBC: 5.6 10*3/uL (ref 4.0–10.5)

## 2016-03-28 LAB — URINE MICROSCOPIC-ADD ON

## 2016-03-28 LAB — URINALYSIS, ROUTINE W REFLEX MICROSCOPIC
Bilirubin Urine: NEGATIVE
Glucose, UA: NEGATIVE mg/dL
Ketones, ur: NEGATIVE mg/dL
Leukocytes, UA: NEGATIVE
Nitrite: NEGATIVE
Protein, ur: 100 mg/dL — AB
Specific Gravity, Urine: 1.015 (ref 1.005–1.030)
pH: 6 (ref 5.0–8.0)

## 2016-03-28 LAB — COMPREHENSIVE METABOLIC PANEL
ALT: 18 U/L (ref 17–63)
AST: 21 U/L (ref 15–41)
Albumin: 4.1 g/dL (ref 3.5–5.0)
Alkaline Phosphatase: 69 U/L (ref 38–126)
Anion gap: 6 (ref 5–15)
BUN: 23 mg/dL — ABNORMAL HIGH (ref 6–20)
CO2: 25 mmol/L (ref 22–32)
Calcium: 9.4 mg/dL (ref 8.9–10.3)
Chloride: 106 mmol/L (ref 101–111)
Creatinine, Ser: 2.13 mg/dL — ABNORMAL HIGH (ref 0.61–1.24)
GFR calc Af Amer: 32 mL/min — ABNORMAL LOW (ref >60)
GFR calc non Af Amer: 28 mL/min — ABNORMAL LOW (ref >60)
Glucose, Bld: 151 mg/dL — ABNORMAL HIGH (ref 65–99)
Potassium: 3.8 mmol/L (ref 3.5–5.1)
Sodium: 137 mmol/L (ref 135–145)
Total Bilirubin: 0.4 mg/dL (ref 0.3–1.2)
Total Protein: 7.7 g/dL (ref 6.5–8.1)

## 2016-03-28 LAB — LIPASE, BLOOD: Lipase: 52 U/L — ABNORMAL HIGH (ref 11–51)

## 2016-03-28 NOTE — Discharge Instructions (Signed)
Your labs today were normal other than elevated creatinine which appears to be chronic for you. There is also small amount of blood in your urine but no other sign of infection. Ultrasound of your testicles was normal. We have tested you for sexual transmitted diseases. If any of these are positive, you'll be contacted for treatment and your partners will need to be notified and treated as well. If you have return of pain, please follow up with your primary care physician. If your pain is severe, associated with fever, vomiting that will not stop, penile discharge, blood in your urine, please return to the hospital.   Abdominal Pain, Adult Many things can cause abdominal pain. Usually, abdominal pain is not caused by a disease and will improve without treatment. It can often be observed and treated at home. Your health care provider will do a physical exam and possibly order blood tests and X-rays to help determine the seriousness of your pain. However, in many cases, more time must pass before a clear cause of the pain can be found. Before that point, your health care provider may not know if you need more testing or further treatment. HOME CARE INSTRUCTIONS Monitor your abdominal pain for any changes. The following actions may help to alleviate any discomfort you are experiencing:  Only take over-the-counter or prescription medicines as directed by your health care provider.  Do not take laxatives unless directed to do so by your health care provider.  Try a clear liquid diet (broth, tea, or water) as directed by your health care provider. Slowly move to a bland diet as tolerated. SEEK MEDICAL CARE IF:  You have unexplained abdominal pain.  You have abdominal pain associated with nausea or diarrhea.  You have pain when you urinate or have a bowel movement.  You experience abdominal pain that wakes you in the night.  You have abdominal pain that is worsened or improved by eating food.  You  have abdominal pain that is worsened with eating fatty foods.  You have a fever. SEEK IMMEDIATE MEDICAL CARE IF:  Your pain does not go away within 2 hours.  You keep throwing up (vomiting).  Your pain is felt only in portions of the abdomen, such as the right side or the left lower portion of the abdomen.  You pass bloody or black tarry stools. MAKE SURE YOU:  Understand these instructions.  Will watch your condition.  Will get help right away if you are not doing well or get worse.   This information is not intended to replace advice given to you by your health care provider. Make sure you discuss any questions you have with your health care provider.   Document Released: 07/09/2005 Document Revised: 06/20/2015 Document Reviewed: 06/08/2013 Elsevier Interactive Patient Education [employer].

## 2016-03-28 NOTE — ED Provider Notes (Signed)
TIME SEEN: 12:20 AM  CHIEF COMPLAINT: Lower abdominal pain, right testicular pain  HPI: Pt is a 80 y.o. male with history of hypertension, diabetes, prostate cancer status post radiation who presents to the emergency department with lower abdominal pain that has been present for the past several days and worsened last night. Describes the pain as a pressure that radiated into his right testicle. No significant swelling of the scrotum that he noticed. Has had rice testicular pain the past with no diagnosis. Denies any penile discharge, dysuria or hematuria. No fevers, chills, nausea, vomiting or diarrhea. No history of any abdominal surgeries. He reports that he is are you feeling better. No aggravating or relieving factors. No history of kidney stones. Reports he is sexual active with multiple male partners. Does not use protection. Reports he has had a history of STDs over 40 years ago.  ROS: See HPI Constitutional: no fever  Eyes: no drainage  ENT: no runny nose   Cardiovascular:  no chest pain  Resp: no SOB  GI: no vomiting GU: no dysuria Integumentary: no rash  Allergy: no hives  Musculoskeletal: no leg swelling  Neurological: no slurred speech ROS otherwise negative  PAST MEDICAL HISTORY/PAST SURGICAL HISTORY:  Past Medical History  Diagnosis Date  . Hypertension   . Diabetes mellitus without complication ([place])   . Cancer ([place])   . Arthritis     MEDICATIONS:  Prior to Admission medications   Medication Sig Start Date End Date Taking? Authorizing Provider  acetaminophen (TYLENOL) 650 MG CR tablet Take 650 mg by mouth every 8 (eight) hours as needed for pain.   Yes Historical Provider, MD  albuterol (PROVENTIL HFA;VENTOLIN HFA) 108 (90 Base) MCG/ACT inhaler Inhale 1-2 puffs into the lungs every 6 (six) hours as needed for wheezing or shortness of breath.   Yes Historical Provider, MD  ALPRAZolam Duanne Moron) 0.5 MG tablet Take 1 tablet by mouth at bedtime. 01/27/15  Yes Historical  Provider, MD  aspirin 81 MG tablet Take 81 mg by mouth daily.   Yes Historical Provider, MD  Cholecalciferol (VITAMIN D-3) 1000 UNITS CAPS Take by mouth daily.   Yes Historical Provider, MD  furosemide (LASIX) 20 MG tablet Take 20 mg by mouth daily.   Yes Historical Provider, MD  gabapentin (NEURONTIN) 300 MG capsule Take 1 capsule by mouth 2 (two) times daily. 01/19/15  Yes Historical Provider, MD  linagliptin (TRADJENTA) 5 MG TABS tablet Take 5 mg by mouth daily.   Yes Historical Provider, MD  losartan (COZAAR) 100 MG tablet Take 1 tablet by mouth daily. 04/06/13  Yes Historical Provider, MD  NIFEdipine (PROCARDIA XL/ADALAT-CC) 90 MG 24 hr tablet TAKE 1 TABLET ONCE DAILY. 01/08/16  Yes Herminio Commons, MD  nitroGLYCERIN (NITROSTAT) 0.4 MG SL tablet Place 0.4 mg under the tongue every 5 (five) minutes as needed for chest pain.   Yes Historical Provider, MD  omeprazole (PRILOSEC) 20 MG capsule Take 1 capsule by mouth 2 (two) times daily. 04/06/13  Yes Historical Provider, MD  pravastatin (PRAVACHOL) 40 MG tablet Take 1 tablet by mouth daily. 04/06/13  Yes Historical Provider, MD  tamsulosin (FLOMAX) 0.4 MG CAPS Take 1 capsule by mouth 2 (two) times daily.  04/06/13  Yes Historical Provider, MD  traMADol (ULTRAM) 50 MG tablet Take 50 mg by mouth 2 (two) times daily.   Yes Historical Provider, MD    ALLERGIES:  No Known Allergies  SOCIAL HISTORY:  Social History  Substance Use Topics  . Smoking status:  Former Smoker -- 2.00 packs/day for 25 years    Types: Pipe, Cigars    Start date: 11/08/1954    Quit date: 10/14/1987  . Smokeless tobacco: Never Used     Comment: About 2 pipes a day  . Alcohol Use: No    FAMILY HISTORY: Family History  Problem Relation Age of Onset  . Heart disease Mother     cause of death  . Diabetes Sister     EXAM: BP 172/90 mmHg  Pulse 93  Temp(Src) 98.7 F (37.1 C) (Oral)  Resp 20  Ht 5\' 5"  (1.651 m)  Wt 180 lb (81.647 kg)  BMI 29.95 kg/m2  SpO2  100% CONSTITUTIONAL: Alert and oriented and responds appropriately to questions. Well-appearing; well-nourished, Elderly but appears younger than stated age HEAD: Normocephalic EYES: Conjunctivae clear, PERRL ENT: normal nose; no rhinorrhea; moist mucous membranes NECK: Supple, no meningismus, no LAD  CARD: RRR; S1 and S2 appreciated; no murmurs, no clicks, no rubs, no gallops RESP: Normal chest excursion without splinting or tachypnea; breath sounds clear and equal bilaterally; no wheezes, no rhonchi, no rales, no hypoxia or respiratory distress, speaking full sentences ABD/GI: Normal bowel sounds; non-distended; soft, non-tender, no rebound, no guarding, no peritoneal signs GU:  Normal external genitalia, circumcised male, normal penile shaft, no blood or discharge at the urethral meatus, no testicular masses on exam, minimally tender in the right testicle no tenderness of the left testicle, no scrotal masses or swelling, no hernias appreciated, 2+ femoral pulses bilaterally; no perineal erythema, warmth, subcutaneous air or crepitus; no high riding testicle, normal bilateral cremasteric reflex.  Chaperone present for exam. BACK:  The back appears normal and is non-tender to palpation, there is no CVA tenderness EXT: Normal ROM in all joints; non-tender to palpation; no edema; normal capillary refill; no cyanosis, no calf tenderness or swelling    SKIN: Normal color for age and race; warm; no rash NEURO: Moves all extremities equally, sensation to light touch intact diffusely, cranial nerves II through XII intact PSYCH: The patient's mood and manner are appropriate. Grooming and personal hygiene are appropriate.  MEDICAL DECISION MAKING: Patient here with lower abdominal pain that radiates into the testicle. Right testicle is tender to palpation without masses or scrotal swelling. No flank pain or abdominal pain currently. No history of kidney stones. Urine shows hematuria but only 0-5 RBCs. No  other sign of infection. We'll obtain labs, scrotal ultrasound. He declines pain medication at this time. Have recommended STD screening given he is sexually active with multiple partners and does not use protection. He agrees to this.  ED PROGRESS: STD screening is pending. His labs are unremarkable other than elevated creatinine which is chronic. Also has mildly elevated lipase but no epigastric abdominal pain, vomiting. Scrotal ultrasound is normal without mass, inflammation, torsion. Again doubt kidney stone given he looks so well and has no pain currently. Abdominal exam is still completely benign. At this time I do not feel he needs CT imaging of his abdomen.  He does not want empiric treatment for STDs at this time but will follow-up in his results. Recommend close follow-up with his PCP. Discussed return precautions. He is comfortable with plan for discharge home.   At this time, I do not feel there is any life-threatening condition present. I have reviewed and discussed all results (EKG, imaging, lab, urine as appropriate), exam findings with patient. I have reviewed nursing notes and appropriate previous records.  I feel the patient is safe to  be discharged home without further emergent workup. Discussed usual and customary return precautions. Patient and family (if present) verbalize understanding and are comfortable with this plan.  Patient will follow-up with their primary care provider. If they do not have a primary care provider, information for follow-up has been provided to them. All questions have been answered.      [place], DO 03/28/16 [phone]

## 2016-03-28 NOTE — ED Notes (Signed)
Pt stated understanding of discharge teaching and follow up care at this time. Pt denied any further needs or questions at this time.

## 2016-03-29 LAB — HEPATITIS PANEL, ACUTE
HCV Ab: <0.1 s/co ratio (ref 0.0–0.9)
Hep A IgM: NEGATIVE
Hep B C IgM: NEGATIVE
Hepatitis B Surface Ag: NEGATIVE

## 2016-03-29 LAB — HIV ANTIBODY (ROUTINE TESTING W REFLEX): HIV Screen 4th Generation wRfx: NONREACTIVE

## 2016-03-29 LAB — RPR: RPR Ser Ql: NONREACTIVE

## 2016-03-31 LAB — GC/CHLAMYDIA PROBE AMP (~~LOC~~) NOT AT ARMC
Chlamydia: NEGATIVE
Neisseria Gonorrhea: NEGATIVE

## 2016-04-07 ENCOUNTER — Ambulatory Visit (INDEPENDENT_AMBULATORY_CARE_PROVIDER_SITE_OTHER): Payer: PPO | Admitting: Otolaryngology

## 2016-04-07 DIAGNOSIS — H9313 Tinnitus, bilateral: Secondary | ICD-10-CM | POA: Diagnosis not present

## 2016-04-07 DIAGNOSIS — H903 Sensorineural hearing loss, bilateral: Secondary | ICD-10-CM

## 2016-04-21 ENCOUNTER — Encounter (HOSPITAL_COMMUNITY): Payer: Self-pay

## 2016-04-21 NOTE — Pre-Procedure Instructions (Signed)
Telephone call completed.  Advised patient that he must bring envelope provided to him by Dr Steve [name]' office in order to complete his record.  Verbalized understanding.

## 2016-04-22 ENCOUNTER — Encounter (HOSPITAL_COMMUNITY)
Admission: RE | Admit: 2016-04-22 | Discharge: 2016-04-22 | Disposition: A | Discharge status: Home / Self Care | Payer: PPO | Source: Ambulatory Visit | Attending: Ophthalmology | Admitting: Ophthalmology

## 2016-04-28 ENCOUNTER — Ambulatory Visit (HOSPITAL_COMMUNITY): Payer: PPO | Admitting: Anesthesiology

## 2016-04-28 ENCOUNTER — Encounter (HOSPITAL_COMMUNITY): Payer: Self-pay | Admitting: Anesthesiology

## 2016-04-28 ENCOUNTER — Ambulatory Visit (HOSPITAL_COMMUNITY)
Admission: RE | Admit: 2016-04-28 | Discharge: 2016-04-28 | Disposition: A | Discharge status: Home / Self Care | Payer: PPO | Source: Ambulatory Visit | Attending: Ophthalmology | Admitting: Ophthalmology

## 2016-04-28 ENCOUNTER — Encounter (HOSPITAL_COMMUNITY)
Admission: RE | Disposition: A | Discharge status: Home / Self Care | Payer: Self-pay | Source: Ambulatory Visit | Attending: Ophthalmology

## 2016-04-28 DIAGNOSIS — H2511 Age-related nuclear cataract, right eye: Secondary | ICD-10-CM | POA: Diagnosis not present

## 2016-04-28 DIAGNOSIS — E119 Type 2 diabetes mellitus without complications: Secondary | ICD-10-CM | POA: Diagnosis not present

## 2016-04-28 DIAGNOSIS — I10 Essential (primary) hypertension: Secondary | ICD-10-CM | POA: Diagnosis not present

## 2016-04-28 DIAGNOSIS — Z87891 Personal history of nicotine dependence: Secondary | ICD-10-CM | POA: Insufficient documentation

## 2016-04-28 HISTORY — PX: CATARACT EXTRACTION W/PHACO: SHX586

## 2016-04-28 LAB — GLUCOSE, CAPILLARY: Glucose-Capillary: 122 mg/dL — ABNORMAL HIGH (ref 65–99)

## 2016-04-28 SURGERY — PHACOEMULSIFICATION, CATARACT, WITH IOL INSERTION
Anesthesia: Monitor Anesthesia Care | Site: Eye | Laterality: Right

## 2016-04-28 MED ORDER — BSS IO SOLN
INTRAOCULAR | Status: DC | PRN
  Administered 2016-04-28: 15 mL

## 2016-04-28 MED ORDER — MIDAZOLAM HCL 2 MG/2ML IJ SOLN
1.0000 mg | INTRAMUSCULAR | Status: DC | PRN
  Administered 2016-04-28: 1 mg via INTRAVENOUS
  Filled 2016-04-28: qty 2

## 2016-04-28 MED ORDER — NA HYALUR & NA CHOND-NA HYALUR 0.55-0.5 ML IO KIT
PACK | INTRAOCULAR | Status: DC | PRN
  Administered 2016-04-28: 1 via OPHTHALMIC

## 2016-04-28 MED ORDER — PHENYLEPHRINE-KETOROLAC 1-0.3 % IO SOLN
INTRAOCULAR | Status: AC
  Filled 2016-04-28: qty 4

## 2016-04-28 MED ORDER — LIDOCAINE HCL 3.5 % OP GEL
1.0000 "application " | Freq: Once | OPHTHALMIC | Status: AC
  Administered 2016-04-28: 1 via OPHTHALMIC

## 2016-04-28 MED ORDER — LIDOCAINE HCL 3.5 % OP GEL
OPHTHALMIC | Status: DC | PRN
  Administered 2016-04-28: 1 via OPHTHALMIC

## 2016-04-28 MED ORDER — PHENYLEPHRINE-KETOROLAC 1-0.3 % IO SOLN
INTRAOCULAR | Status: DC | PRN
  Administered 2016-04-28: 500 mL via OPHTHALMIC

## 2016-04-28 MED ORDER — FENTANYL CITRATE (PF) 100 MCG/2ML IJ SOLN
25.0000 ug | INTRAMUSCULAR | Status: DC | PRN
  Administered 2016-04-28: 25 ug via INTRAVENOUS
  Filled 2016-04-28: qty 2

## 2016-04-28 MED ORDER — TETRACAINE HCL 0.5 % OP SOLN
1.0000 [drp] | OPHTHALMIC | Status: AC
  Administered 2016-04-28 (×3): 1 [drp] via OPHTHALMIC

## 2016-04-28 MED ORDER — TETRACAINE 0.5 % OP SOLN OPTIME - NO CHARGE
OPHTHALMIC | Status: DC | PRN
  Administered 2016-04-28: 2 [drp] via OPHTHALMIC

## 2016-04-28 MED ORDER — CYCLOPENTOLATE-PHENYLEPHRINE 0.2-1 % OP SOLN
1.0000 [drp] | OPHTHALMIC | Status: AC
  Administered 2016-04-28 (×3): 1 [drp] via OPHTHALMIC

## 2016-04-28 MED ORDER — POVIDONE-IODINE 5 % OP SOLN
OPHTHALMIC | Status: DC | PRN
  Administered 2016-04-28: 1 via OPHTHALMIC

## 2016-04-28 MED ORDER — LACTATED RINGERS IV SOLN
INTRAVENOUS | Status: DC
  Administered 2016-04-28: 08:00:00 via INTRAVENOUS

## 2016-04-28 SURGICAL SUPPLY — 8 items
CLOTH BEACON ORANGE TIMEOUT ST (SAFETY) ×3 IMPLANT
GLOVE BIOGEL PI IND STRL 7.0 (GLOVE) ×1 IMPLANT
GLOVE BIOGEL PI INDICATOR 7.0 (GLOVE) ×2
GLOVE EXAM NITRILE MD LF STRL (GLOVE) ×3 IMPLANT
INST SET CATARACT ~~LOC~~ (KITS) ×3 IMPLANT
LENS ALC ACRYL/TECN (Ophthalmic Related) ×3 IMPLANT
PAD ARMBOARD 7.5X6 YLW CONV (MISCELLANEOUS) ×3 IMPLANT
WATER STERILE IRR 250ML POUR (IV SOLUTION) ×3 IMPLANT

## 2016-04-28 NOTE — Op Note (Signed)
04/28/2016  1:47 PM  PATIENT:  Steve [name]  80 y.o. male  PRE-OPERATIVE DIAGNOSIS:  nuclear cataract right eye  POST-OPERATIVE DIAGNOSIS:  nuclear cataract right eye  PROCEDURE:  Procedure(s): CATARACT EXTRACTION PHACO AND INTRAOCULAR LENS PLACEMENT ([place])  SURGEON:  Surgeon(s): Williams Che, MD  ASSISTANTS:   Bonney Roussel, CST   ANESTHESIA STAFF: Anesthesiologist: Lerry Liner, MD CRNA: Vista Deck, CRNA  ANESTHESIA:   topical and MAC  REQUESTED LENS POWER: 20.5  LENS IMPLANT INFORMATION:   Alcon [id number]   S/n  [id number]  Exp 01/2019  CUMULATIVE DISSIPATED ENERGY:4.28  INDICATIONS:see scanned office H&P  OP FINDINGS:dense NS  COMPLICATIONS:None  PROCEDURE:  The patient was brought to the operating room in good condition.  The operative eye was prepped and draped in the usual fashion for intraocular surgery.  Lidocaine gel was dropped onto the eye.  A 2.4 mm 10 O'clock near clear corneal stepped incision and a 12 O'clock stab incision were created.  Viscoat was instilled into the anterior chamber.  The 5 mm anterior capsulorhexis was performed with a bent needle cystotome and Utrata forceps.  The lens was hydrodissected and hydrodelineated with a cannula and balanced salt solution and rotated with a Kuglen hook.  Phacoemulsification was perfomed in the divide and conquer technique.  The remaining cortex was removed with I&A and the capsular surfaces polished as necessary.  Provisc was placed into the capsular bag and the lens inserted with the Alcon inserter.  The viscoelastic was removed with I&A and the lens "rocked" into position.  The wounds were hydrated and te anterior chamber was refilled with balanced salt solution.  The wounds were checked for leakage and rehydrated as necessary.  The lid speculum and drapes were removed and the patient was transported to short stay in good condition.  PATIENT DISPOSITION:  Short Stay

## 2016-04-28 NOTE — Anesthesia Preprocedure Evaluation (Signed)
Anesthesia Evaluation  Patient identified by MRN, date of birth, ID band Patient awake    Reviewed: Allergy & Precautions, NPO status , Patient's Chart, lab work & pertinent test results  Airway Mallampati: II  TM Distance: >3 FB     Dental  (+) Teeth Intact   Pulmonary shortness of breath and with exertion, former smoker,    breath sounds clear to auscultation       Cardiovascular hypertension, Pt. on medications  Rhythm:Regular     Neuro/Psych    GI/Hepatic negative GI ROS,   Endo/Other  diabetes, Type 2  Renal/GU      Musculoskeletal   Abdominal   Peds  Hematology   Anesthesia Other Findings   Reproductive/Obstetrics                             Anesthesia Physical Anesthesia Plan  ASA: III  Anesthesia Plan: MAC   Post-op Pain Management:    Induction: Intravenous  Airway Management Planned: Nasal Cannula  Additional Equipment:   Intra-op Plan:   Post-operative Plan:   Informed Consent: I have reviewed the patients History and Physical, chart, labs and discussed the procedure including the risks, benefits and alternatives for the proposed anesthesia with the patient or authorized representative who has indicated his/her understanding and acceptance.     Plan Discussed with:   Anesthesia Plan Comments:         Anesthesia Quick Evaluation

## 2016-04-28 NOTE — Transfer of Care (Signed)
Immediate Anesthesia Transfer of Care Note  Patient: Steve [name]  Procedure(s) Performed: Procedure(s) (LRB): CATARACT EXTRACTION PHACO AND INTRAOCULAR LENS PLACEMENT ([facility]) (Right)  Patient Location: Shortstay  Anesthesia Type: MAC  Level of Consciousness: awake  Airway & Oxygen Therapy: Patient Spontanous Breathing   Post-op Assessment: Report given to PACU RN, Post -op Vital signs reviewed and stable and Patient moving all extremities  Post vital signs: Reviewed and stable  Complications: No apparent anesthesia complications

## 2016-04-28 NOTE — H&P (Signed)
I have reviewed the pre printed H&P, the patient was re-examined, and I have identified no significant interval changes in the patient's medical condition.  There is no change in the plan of care since the history and physical of record. 

## 2016-04-28 NOTE — Brief Op Note (Signed)
04/28/2016  1:47 PM  PATIENT:  Steve [name]  80 y.o. male  PRE-OPERATIVE DIAGNOSIS:  nuclear cataract right eye  POST-OPERATIVE DIAGNOSIS:  nuclear cataract right eye  PROCEDURE:  Procedure(s): CATARACT EXTRACTION PHACO AND INTRAOCULAR LENS PLACEMENT ([place])  SURGEON:  Surgeon(s): Williams Che, MD  ASSISTANTS:   Bonney Roussel, CST   ANESTHESIA STAFF: Anesthesiologist: Lerry Liner, MD CRNA: Vista Deck, CRNA  ANESTHESIA:   topical and MAC  REQUESTED LENS POWER: 20.5  LENS IMPLANT INFORMATION:   [facility] [id number]   S/n  [id number]  Exp 01/2019  CUMULATIVE DISSIPATED ENERGY:4.28  INDICATIONS:see scanned office H&P  OP FINDINGS:dense NS  COMPLICATIONS:None  DICTATION #: none  PLAN OF CARE:  As above  PATIENT DISPOSITION:  Short Stay

## 2016-04-28 NOTE — Anesthesia Procedure Notes (Signed)
Procedure Name: MAC Date/Time: 04/28/2016 8:41 AM Performed by: Vista Deck Pre-anesthesia Checklist: Patient identified, Emergency Drugs available, Suction available, Timeout performed and Patient being monitored Patient Re-evaluated:Patient Re-evaluated prior to inductionOxygen Delivery Method: Nasal Cannula

## 2016-04-28 NOTE — Discharge Instructions (Signed)
General Anesthesia, Adult, Care After Refer to this sheet in the next few weeks. These instructions provide you with information on caring for yourself after your procedure. Your health care provider may also give you more specific instructions. Your treatment has been planned according to current medical practices, but problems sometimes occur. Call your health care provider if you have any problems or questions after your procedure. WHAT TO EXPECT AFTER THE PROCEDURE After the procedure, it is typical to experience:  Sleepiness.  Nausea and vomiting. HOME CARE INSTRUCTIONS  For the first 24 hours after general anesthesia:  Have a responsible person with you.  Do not drive a car. If you are alone, do not take public transportation.  Do not drink alcohol.  Do not take medicine that has not been prescribed by your health care provider.  Do not sign important papers or make important decisions.  You may resume a normal diet and activities as directed by your health care provider.  Change bandages (dressings) as directed.  If you have questions or problems that seem related to general anesthesia, call the hospital and ask for the anesthetist or anesthesiologist on call. SEEK MEDICAL CARE IF:  You have nausea and vomiting that continue the day after anesthesia.  You develop a rash. SEEK IMMEDIATE MEDICAL CARE IF:   You have difficulty breathing.  You have chest pain.  You have any allergic problems.   This information is not intended to replace advice given to you by your health care provider. Make sure you discuss any questions you have with your health care provider.

## 2016-04-28 NOTE — Anesthesia Postprocedure Evaluation (Signed)
  Anesthesia Post-op Note  Patient: Steve [name]  Procedure(s) Performed: Procedure(s) (LRB): CATARACT EXTRACTION PHACO AND INTRAOCULAR LENS PLACEMENT ([facility]) (Right)  Patient Location:  Short Stay  Anesthesia Type: MAC  Level of Consciousness: awake  Airway and Oxygen Therapy: Patient Spontanous Breathing  Post-op Pain: none  Post-op Assessment: Post-op Vital signs reviewed, Patient's Cardiovascular Status Stable, Respiratory Function Stable, Patent Airway, No signs of Nausea or vomiting and Pain level controlled  Post-op Vital Signs: Reviewed and stable  Complications: No apparent anesthesia complications

## 2016-05-02 ENCOUNTER — Encounter (HOSPITAL_COMMUNITY): Payer: Self-pay | Admitting: Ophthalmology

## 2016-06-07 ENCOUNTER — Encounter (HOSPITAL_COMMUNITY): Payer: Self-pay

## 2016-06-07 ENCOUNTER — Emergency Department (HOSPITAL_COMMUNITY): Payer: PPO

## 2016-06-07 ENCOUNTER — Emergency Department (HOSPITAL_COMMUNITY)
Admission: EM | Admit: 2016-06-07 | Discharge: 2016-06-07 | Disposition: A | Discharge status: Home / Self Care | Payer: PPO | Attending: Emergency Medicine | Admitting: Emergency Medicine

## 2016-06-07 DIAGNOSIS — Z7982 Long term (current) use of aspirin: Secondary | ICD-10-CM | POA: Insufficient documentation

## 2016-06-07 DIAGNOSIS — Z87891 Personal history of nicotine dependence: Secondary | ICD-10-CM | POA: Insufficient documentation

## 2016-06-07 DIAGNOSIS — G43909 Migraine, unspecified, not intractable, without status migrainosus: Secondary | ICD-10-CM | POA: Diagnosis present

## 2016-06-07 DIAGNOSIS — I10 Essential (primary) hypertension: Secondary | ICD-10-CM | POA: Insufficient documentation

## 2016-06-07 DIAGNOSIS — E119 Type 2 diabetes mellitus without complications: Secondary | ICD-10-CM | POA: Diagnosis not present

## 2016-06-07 DIAGNOSIS — Z8546 Personal history of malignant neoplasm of prostate: Secondary | ICD-10-CM | POA: Diagnosis not present

## 2016-06-07 DIAGNOSIS — R51 Headache: Secondary | ICD-10-CM | POA: Insufficient documentation

## 2016-06-07 DIAGNOSIS — Z79899 Other long term (current) drug therapy: Secondary | ICD-10-CM | POA: Insufficient documentation

## 2016-06-07 DIAGNOSIS — R519 Headache, unspecified: Secondary | ICD-10-CM

## 2016-06-07 LAB — BASIC METABOLIC PANEL
Anion gap: 3 — ABNORMAL LOW (ref 5–15)
BUN: 36 mg/dL — ABNORMAL HIGH (ref 6–20)
CO2: 23 mmol/L (ref 22–32)
Calcium: 8.8 mg/dL — ABNORMAL LOW (ref 8.9–10.3)
Chloride: 109 mmol/L (ref 101–111)
Creatinine, Ser: 2.59 mg/dL — ABNORMAL HIGH (ref 0.61–1.24)
GFR calc Af Amer: 26 mL/min — ABNORMAL LOW (ref >60)
GFR calc non Af Amer: 22 mL/min — ABNORMAL LOW (ref >60)
Glucose, Bld: 197 mg/dL — ABNORMAL HIGH (ref 65–99)
Potassium: 4 mmol/L (ref 3.5–5.1)
Sodium: 135 mmol/L (ref 135–145)

## 2016-06-07 MED ORDER — FENTANYL CITRATE (PF) 100 MCG/2ML IJ SOLN
12.5000 ug | Freq: Once | INTRAMUSCULAR | Status: AC
  Administered 2016-06-07: 12.5 ug via INTRAVENOUS
  Filled 2016-06-07: qty 2

## 2016-06-07 MED ORDER — PROCHLORPERAZINE EDISYLATE 5 MG/ML IJ SOLN
10.0000 mg | Freq: Once | INTRAMUSCULAR | Status: AC
  Administered 2016-06-07: 10 mg via INTRAVENOUS
  Filled 2016-06-07: qty 2

## 2016-06-07 MED ORDER — DIPHENHYDRAMINE HCL 50 MG/ML IJ SOLN
12.5000 mg | Freq: Once | INTRAMUSCULAR | Status: AC
  Administered 2016-06-07: 12.5 mg via INTRAVENOUS
  Filled 2016-06-07: qty 1

## 2016-06-07 NOTE — ED Provider Notes (Signed)
[facility] DEPT Provider Note   CSN: [id number] Arrival date & time: 06/07/16  0258     History   Chief Complaint Chief Complaint  Patient presents with  . Migraine    HPI Steve [name] is a 80 y.o. male.  HPI Frontal headache x 2 days, intermittent, worse this am, states his blood pressure at home has been high.  Patient has been taking his blood pressure medicine.  The headache has been gradual in onset.  It comes and goes.  Denies any weakness or speech abnormalities.  Denies neck pain.  Denies fever chills.  Past Medical History:  Diagnosis Date  . Arthritis   . Cancer ([place])   . Diabetes mellitus without complication ([place])   . Hypertension     Patient Active Problem List   Diagnosis Date Noted  . Prostate cancer ([place]) 07/10/2014  . Abnormal ECG 04/12/2013  . HTN (hypertension) 04/12/2013  . SOB (shortness of breath) 04/12/2013  . CHEST PAIN-UNSPECIFIED 06/12/2009    Past Surgical History:  Procedure Laterality Date  . CATARACT EXTRACTION W/PHACO Right 04/28/2016   Procedure: CATARACT EXTRACTION PHACO AND INTRAOCULAR LENS PLACEMENT ([facility]);  Surgeon: Williams Che, MD;  Location: [facility];  Service: Ophthalmology;  Laterality: Right;  CDE: 4.28  . CYSTECTOMY         Home Medications    Prior to Admission medications   Medication Sig Start Date End Date Taking? Authorizing Provider  acetaminophen (TYLENOL) 650 MG CR tablet Take 650 mg by mouth every 8 (eight) hours as needed for pain.    Historical Provider, MD  albuterol (PROVENTIL HFA;VENTOLIN HFA) 108 (90 Base) MCG/ACT inhaler Inhale 1-2 puffs into the lungs every 6 (six) hours as needed for wheezing or shortness of breath.    Historical Provider, MD  ALPRAZolam Duanne Moron) 0.5 MG tablet Take 1 tablet by mouth at bedtime. 01/27/15   Historical Provider, MD  aspirin 81 MG tablet Take 81 mg by mouth daily.    Historical Provider, MD  Cholecalciferol (VITAMIN D-3) 1000 UNITS CAPS Take 1 capsule by mouth daily.      Historical Provider, MD  furosemide (LASIX) 20 MG tablet Take 20 mg by mouth daily.    Historical Provider, MD  gabapentin (NEURONTIN) 300 MG capsule Take 1 capsule by mouth 2 (two) times daily. 01/19/15   Historical Provider, MD  linagliptin (TRADJENTA) 5 MG TABS tablet Take 5 mg by mouth daily.    Historical Provider, MD  losartan (COZAAR) 100 MG tablet Take 1 tablet by mouth daily. 04/06/13   Historical Provider, MD  NIFEdipine (PROCARDIA XL/ADALAT-CC) 90 MG 24 hr tablet TAKE 1 TABLET ONCE DAILY. 01/08/16   Herminio Commons, MD  nitroGLYCERIN (NITROSTAT) 0.4 MG SL tablet Place 0.4 mg under the tongue every 5 (five) minutes as needed for chest pain.    Historical Provider, MD  omeprazole (PRILOSEC) 20 MG capsule Take 1 capsule by mouth 2 (two) times daily. 04/06/13   Historical Provider, MD  pravastatin (PRAVACHOL) 40 MG tablet Take 1 tablet by mouth daily. 04/06/13   Historical Provider, MD  tamsulosin (FLOMAX) 0.4 MG CAPS Take 1 capsule by mouth 2 (two) times daily.  04/06/13   Historical Provider, MD  traMADol (ULTRAM) 50 MG tablet Take 50 mg by mouth 2 (two) times daily.    Historical Provider, MD    Family History Family History  Problem Relation Age of Onset  . Heart disease Mother     cause of death  . Diabetes Sister  Social History Social History  Substance Use Topics  . Smoking status: Former Smoker    Packs/day: 2.00    Years: 25.00    Types: Pipe, Cigars    Start date: 11/08/1954    Quit date: 10/14/1987  . Smokeless tobacco: Never Used     Comment: About 2 pipes a day  . Alcohol use No     Allergies   Review of patient's allergies indicates no known allergies.   Review of Systems Review of Systems  Constitutional: Negative for appetite change and unexpected weight change.  Neurological: Negative for facial asymmetry.  All other systems reviewed and are negative.    Physical Exam Updated Vital Signs BP 136/67 (BP Location: Left Arm)   Pulse 75   Temp  97.9 F (36.6 C) (Oral)   Resp 20   Ht 5\' 5"  (1.651 m)   Wt 180 lb (81.6 kg)   SpO2 98%   BMI 29.95 kg/m   Physical Exam  Constitutional: He is oriented to person, place, and time. He appears well-developed and well-nourished. No distress.  HENT:  Head: Normocephalic and atraumatic.  Eyes: Pupils are equal, round, and reactive to light.  Neck: Normal range of motion.  Cardiovascular: Normal rate and intact distal pulses.   Pulmonary/Chest: Effort normal and breath sounds normal. No respiratory distress.  Abdominal: Soft. Normal appearance and bowel sounds are normal. He exhibits no distension.  Musculoskeletal: Normal range of motion.  Neurological: He is alert and oriented to person, place, and time. He has normal strength. No cranial nerve deficit or sensory deficit. He exhibits normal muscle tone. GCS eye subscore is 4. GCS verbal subscore is 5. GCS motor subscore is 6.  Skin: Skin is warm and dry. No rash noted.  Psychiatric: He has a normal mood and affect. His behavior is normal.  Nursing note and vitals reviewed.    ED Treatments / Results  Labs (all labs ordered are listed, but only abnormal results are displayed) Labs Reviewed  BASIC METABOLIC PANEL - Abnormal; Notable for the following:       Result Value   Glucose, Bld 197 (*)    BUN 36 (*)    Creatinine, Ser 2.59 (*)    Calcium 8.8 (*)    GFR calc non Af Amer 22 (*)    GFR calc Af Amer 26 (*)    Anion gap 3 (*)    All other components within normal limits    EKG  EKG Interpretation None       Radiology Ct Head Wo Contrast  Result Date: 06/07/2016 CLINICAL DATA:  Frontal headache for 2 days. EXAM: CT HEAD WITHOUT CONTRAST TECHNIQUE: Contiguous axial images were obtained from the base of the skull through the vertex without intravenous contrast. COMPARISON:  None. FINDINGS: Brain: No evidence of acute infarction, hemorrhage, hydrocephalus, extra-axial collection or mass lesion/mass effect. Age related  atrophy and mild chronic small vessel ischemia. Vascular: No hyperdense vessel or unexpected calcification. Atherosclerosis of skullbase vasculature. Skull: No fracture or focal lesion. Sinuses/Orbits: No acute finding. IMPRESSION: No acute intracranial abnormality. Electronically Signed   By: Jeb Levering M.D.   On: 06/07/2016 05:09    Procedures Procedures (including critical care time)  Medications Ordered in ED Medications  fentaNYL (SUBLIMAZE) injection 12.5 mcg (not administered)  prochlorperazine (COMPAZINE) injection 10 mg (10 mg Intravenous Given 06/07/16 0348)  diphenhydrAMINE (BENADRYL) injection 12.5 mg (12.5 mg Intravenous Given 06/07/16 0348)  fentaNYL (SUBLIMAZE) injection 12.5 mcg (12.5 mcg Intravenous Given 06/07/16  0347)     Initial Impression / Assessment and Plan / ED Course  I have reviewed the triage vital signs and the nursing notes.  Pertinent labs & imaging results that were available during my care of the patient were reviewed by me and considered in my medical decision making (see chart for details).  Clinical Course   After treatment in the ED the patient feels back to baseline and wants to go home.   Final Clinical Impressions(s) / ED Diagnoses   Final diagnoses:  Bad headache    New Prescriptions New Prescriptions   No medications on file     Leonard Schwartz, MD 06/07/16 [phone]

## 2016-06-07 NOTE — ED Triage Notes (Signed)
Frontal headache x 2 days, intermittent, worse this am, states his blood pressure at home has been high

## 2016-08-09 ENCOUNTER — Other Ambulatory Visit: Payer: Self-pay | Admitting: Cardiovascular Disease

## 2016-08-25 ENCOUNTER — Ambulatory Visit: Payer: PPO | Admitting: Cardiovascular Disease

## 2016-09-03 ENCOUNTER — Encounter: Payer: PPO | Admitting: Cardiology

## 2016-09-03 ENCOUNTER — Encounter: Payer: Self-pay | Admitting: Cardiology

## 2016-09-24 ENCOUNTER — Ambulatory Visit: Payer: PPO | Admitting: Cardiovascular Disease

## 2016-10-14 DIAGNOSIS — R809 Proteinuria, unspecified: Secondary | ICD-10-CM | POA: Diagnosis not present

## 2016-10-14 DIAGNOSIS — N183 Chronic kidney disease, stage 3 (moderate): Secondary | ICD-10-CM | POA: Diagnosis not present

## 2016-10-14 DIAGNOSIS — E1129 Type 2 diabetes mellitus with other diabetic kidney complication: Secondary | ICD-10-CM | POA: Diagnosis not present

## 2016-10-14 DIAGNOSIS — I10 Essential (primary) hypertension: Secondary | ICD-10-CM | POA: Diagnosis not present

## 2016-10-15 ENCOUNTER — Encounter: Payer: Self-pay | Admitting: Cardiovascular Disease

## 2016-10-15 ENCOUNTER — Ambulatory Visit (INDEPENDENT_AMBULATORY_CARE_PROVIDER_SITE_OTHER): Payer: Medicare HMO | Admitting: Cardiovascular Disease

## 2016-10-15 VITALS — BP 132/78 | HR 58 | Ht 65.0 in | Wt 185.0 lb

## 2016-10-15 DIAGNOSIS — E7800 Pure hypercholesterolemia, unspecified: Secondary | ICD-10-CM

## 2016-10-15 DIAGNOSIS — E1121 Type 2 diabetes mellitus with diabetic nephropathy: Secondary | ICD-10-CM | POA: Diagnosis not present

## 2016-10-15 DIAGNOSIS — I10 Essential (primary) hypertension: Secondary | ICD-10-CM | POA: Diagnosis not present

## 2016-10-15 DIAGNOSIS — R0602 Shortness of breath: Secondary | ICD-10-CM

## 2016-10-15 NOTE — Patient Instructions (Signed)

## 2016-10-15 NOTE — Progress Notes (Signed)
SUBJECTIVE: The patient returns for routine follow-up for exertional dyspnea and associated cardiovascular risk factors.  Nuclear stress test on 10/30/2015 was low risk with a possible trivial region of anteroapical ischemia with no large ischemic territories or myocardial scar seen.  He had some shortness of breath and is dealing with an upper respirator tract infection and received a breathing treatment 2 days ago and is feeling better. Denies exertional chest pain.    Review of Systems: As per "subjective", otherwise negative.  No Known Allergies  Current Outpatient Prescriptions  Medication Sig Dispense Refill  . acetaminophen (TYLENOL) 650 MG CR tablet Take 650 mg by mouth every 8 (eight) hours as needed for pain.    Marland Kitchen albuterol (PROVENTIL HFA;VENTOLIN HFA) 108 (90 Base) MCG/ACT inhaler Inhale 1-2 puffs into the lungs every 6 (six) hours as needed for wheezing or shortness of breath.    . ALPRAZolam (XANAX) 0.5 MG tablet Take 1 tablet by mouth at bedtime.    Marland Kitchen aspirin 81 MG tablet Take 81 mg by mouth daily.    . Cholecalciferol (VITAMIN D-3) 1000 UNITS CAPS Take 1 capsule by mouth daily.     . furosemide (LASIX) 20 MG tablet Take 20 mg by mouth daily.    Marland Kitchen gabapentin (NEURONTIN) 300 MG capsule Take 1 capsule by mouth 2 (two) times daily.    . iron polysaccharides (NIFEREX) 150 MG capsule Take 150 mg by mouth daily.    Marland Kitchen linagliptin (TRADJENTA) 5 MG TABS tablet Take 5 mg by mouth daily.    Marland Kitchen losartan (COZAAR) 100 MG tablet Take 1 tablet by mouth daily.    Marland Kitchen NIFEdipine (PROCARDIA XL/ADALAT-CC) 90 MG 24 hr tablet TAKE 1 TABLET ONCE DAILY. 30 tablet 6  . nitroGLYCERIN (NITROSTAT) 0.4 MG SL tablet Place 0.4 mg under the tongue every 5 (five) minutes as needed for chest pain.    Marland Kitchen omeprazole (PRILOSEC) 20 MG capsule Take 1 capsule by mouth 2 (two) times daily.    . pravastatin (PRAVACHOL) 40 MG tablet Take 1 tablet by mouth daily.    . tamsulosin (FLOMAX) 0.4 MG CAPS Take 1  capsule by mouth 2 (two) times daily.     . traMADol (ULTRAM) 50 MG tablet Take 50 mg by mouth 2 (two) times daily.     No current facility-administered medications for this visit.     Past Medical History:  Diagnosis Date  . Arthritis   . Cancer ([place])   . Diabetes mellitus without complication ([place])   . Hypertension     Past Surgical History:  Procedure Laterality Date  . CATARACT EXTRACTION W/PHACO Right 04/28/2016   Procedure: CATARACT EXTRACTION PHACO AND INTRAOCULAR LENS PLACEMENT ([facility]);  Surgeon: Williams Che, MD;  Location: [facility];  Service: Ophthalmology;  Laterality: Right;  CDE: 4.28  . CYSTECTOMY      Social History   Social History  . Marital status: Divorced    Spouse name: N/A  . Number of children: N/A  . Years of education: N/A   Occupational History  . Retired    Social History Main Topics  . Smoking status: Former Smoker    Packs/day: 2.00    Years: 25.00    Types: Pipe, Cigars    Start date: 11/08/1954    Quit date: 10/14/1987  . Smokeless tobacco: Never Used     Comment: About 2 pipes a day  . Alcohol use No  . Drug use: No  . Sexual activity: Not on  file   Other Topics Concern  . Not on file   Social History Narrative  . No narrative on file     Vitals:   10/15/16 1048  BP: 132/78  Pulse: (!) 58  SpO2: 98%  Weight: 185 lb (83.9 kg)  Height: 5\' 5"  (1.651 m)    PHYSICAL EXAM General: NAD HEENT: Normal. Neck: No JVD, no thyromegaly. Lungs: Clear to auscultation bilaterally with normal respiratory effort. CV: Nondisplaced PMI.  Regular rate and rhythm, normal S1/S2, no S3/S4, no murmur. No pretibial or periankle edema.  No carotid bruit.   Abdomen: Soft, nontender, no distention.  Neurologic: Alert and oriented.  Psych: Normal affect. Skin: Normal. Musculoskeletal: No gross deformities.    ECG: Most recent ECG reviewed.      ASSESSMENT AND PLAN: 1. Shortness of breath: Symptomatically stable. Exercise Cardiolite  stress test in 10/2015 was low risk. Cardiovascular risk factors include HTN, hyperlipidemia and diabetes. ECG was abnormal. Continue ASA and statin.  Chest xray unremarkable in 07/2015. Spirometry also normal. If symptoms were to get worse, would consider coronary angiography.  2. Essential HTN: Controlled. No changes.  3. Prostate cancer: Sees Dr. Exie Parody. Completed radiation treatment.   4. Type 2 diabetes: On linagliptin.  5. Hyperlipidemia: On statin therapy.  Dispo: f/u 55yr  Kate Sable, M.D., F.A.C.C.

## 2016-10-24 DIAGNOSIS — K5730 Diverticulosis of large intestine without perforation or abscess without bleeding: Secondary | ICD-10-CM | POA: Diagnosis not present

## 2016-10-24 DIAGNOSIS — Z8546 Personal history of malignant neoplasm of prostate: Secondary | ICD-10-CM | POA: Diagnosis not present

## 2016-10-24 DIAGNOSIS — Z7982 Long term (current) use of aspirin: Secondary | ICD-10-CM | POA: Diagnosis not present

## 2016-10-24 DIAGNOSIS — R1011 Right upper quadrant pain: Secondary | ICD-10-CM | POA: Diagnosis not present

## 2016-10-24 DIAGNOSIS — K439 Ventral hernia without obstruction or gangrene: Secondary | ICD-10-CM | POA: Diagnosis not present

## 2016-10-24 DIAGNOSIS — Z79899 Other long term (current) drug therapy: Secondary | ICD-10-CM | POA: Diagnosis not present

## 2016-10-24 DIAGNOSIS — R1013 Epigastric pain: Secondary | ICD-10-CM | POA: Diagnosis not present

## 2016-10-24 DIAGNOSIS — I10 Essential (primary) hypertension: Secondary | ICD-10-CM | POA: Diagnosis not present

## 2016-10-24 DIAGNOSIS — E1140 Type 2 diabetes mellitus with diabetic neuropathy, unspecified: Secondary | ICD-10-CM | POA: Diagnosis not present

## 2016-10-24 DIAGNOSIS — K851 Biliary acute pancreatitis without necrosis or infection: Secondary | ICD-10-CM | POA: Diagnosis not present

## 2016-10-24 DIAGNOSIS — Z87891 Personal history of nicotine dependence: Secondary | ICD-10-CM | POA: Diagnosis not present

## 2016-10-27 ENCOUNTER — Other Ambulatory Visit (HOSPITAL_COMMUNITY): Payer: Self-pay | Admitting: Internal Medicine

## 2016-10-27 ENCOUNTER — Other Ambulatory Visit: Payer: Self-pay | Admitting: Internal Medicine

## 2016-10-27 DIAGNOSIS — R1031 Right lower quadrant pain: Secondary | ICD-10-CM | POA: Diagnosis not present

## 2016-10-27 DIAGNOSIS — N183 Chronic kidney disease, stage 3 (moderate): Secondary | ICD-10-CM | POA: Diagnosis not present

## 2016-10-27 DIAGNOSIS — N4281 Prostatodynia syndrome: Secondary | ICD-10-CM | POA: Diagnosis not present

## 2016-10-27 DIAGNOSIS — R1084 Generalized abdominal pain: Secondary | ICD-10-CM

## 2016-10-27 DIAGNOSIS — C61 Malignant neoplasm of prostate: Secondary | ICD-10-CM | POA: Diagnosis not present

## 2016-10-27 DIAGNOSIS — E1142 Type 2 diabetes mellitus with diabetic polyneuropathy: Secondary | ICD-10-CM | POA: Diagnosis not present

## 2016-10-31 ENCOUNTER — Ambulatory Visit (HOSPITAL_COMMUNITY)
Admission: RE | Admit: 2016-10-31 | Discharge: 2016-10-31 | Disposition: A | Discharge status: Home / Self Care | Payer: Medicare HMO | Source: Ambulatory Visit | Attending: Internal Medicine | Admitting: Internal Medicine

## 2016-10-31 DIAGNOSIS — R1084 Generalized abdominal pain: Secondary | ICD-10-CM

## 2016-10-31 DIAGNOSIS — R109 Unspecified abdominal pain: Secondary | ICD-10-CM | POA: Diagnosis not present

## 2016-11-14 DIAGNOSIS — Z8546 Personal history of malignant neoplasm of prostate: Secondary | ICD-10-CM | POA: Diagnosis not present

## 2016-11-14 DIAGNOSIS — E1122 Type 2 diabetes mellitus with diabetic chronic kidney disease: Secondary | ICD-10-CM | POA: Diagnosis not present

## 2016-11-14 DIAGNOSIS — Z79899 Other long term (current) drug therapy: Secondary | ICD-10-CM | POA: Diagnosis not present

## 2016-11-14 DIAGNOSIS — N189 Chronic kidney disease, unspecified: Secondary | ICD-10-CM | POA: Diagnosis not present

## 2016-11-14 DIAGNOSIS — R109 Unspecified abdominal pain: Secondary | ICD-10-CM | POA: Diagnosis not present

## 2016-11-14 DIAGNOSIS — Z87891 Personal history of nicotine dependence: Secondary | ICD-10-CM | POA: Diagnosis not present

## 2016-11-14 DIAGNOSIS — Z7982 Long term (current) use of aspirin: Secondary | ICD-10-CM | POA: Diagnosis not present

## 2016-11-14 DIAGNOSIS — I129 Hypertensive chronic kidney disease with stage 1 through stage 4 chronic kidney disease, or unspecified chronic kidney disease: Secondary | ICD-10-CM | POA: Diagnosis not present

## 2016-11-14 DIAGNOSIS — R1012 Left upper quadrant pain: Secondary | ICD-10-CM | POA: Diagnosis not present

## 2016-11-14 DIAGNOSIS — Z923 Personal history of irradiation: Secondary | ICD-10-CM | POA: Diagnosis not present

## 2016-11-24 DIAGNOSIS — K8020 Calculus of gallbladder without cholecystitis without obstruction: Secondary | ICD-10-CM | POA: Diagnosis not present

## 2016-12-10 DIAGNOSIS — I10 Essential (primary) hypertension: Secondary | ICD-10-CM | POA: Diagnosis not present

## 2016-12-10 DIAGNOSIS — N183 Chronic kidney disease, stage 3 (moderate): Secondary | ICD-10-CM | POA: Diagnosis not present

## 2016-12-10 DIAGNOSIS — K219 Gastro-esophageal reflux disease without esophagitis: Secondary | ICD-10-CM | POA: Diagnosis not present

## 2016-12-10 DIAGNOSIS — E1142 Type 2 diabetes mellitus with diabetic polyneuropathy: Secondary | ICD-10-CM | POA: Diagnosis not present

## 2016-12-16 ENCOUNTER — Encounter: Payer: Self-pay | Admitting: General Surgery

## 2016-12-16 ENCOUNTER — Ambulatory Visit (INDEPENDENT_AMBULATORY_CARE_PROVIDER_SITE_OTHER): Payer: Medicare HMO | Admitting: General Surgery

## 2016-12-16 VITALS — BP 144/74 | HR 76 | Temp 97.1°F | Resp 20 | Ht 65.0 in | Wt 172.0 lb

## 2016-12-16 DIAGNOSIS — R1030 Lower abdominal pain, unspecified: Secondary | ICD-10-CM

## 2016-12-16 NOTE — Progress Notes (Signed)
Subjective:     Steve [name]  Patient is here for follow-up of biliary sludge seen on ultrasound. Patient continues to have no right upper quadrant abdominal pain, nausea, or fatty food intolerance. His primary complaint is that of intermittent supra-pubic pain. He denies any fever, chills, or jaundice. He states that he does not think it is his gallbladder. Objective:    BP (!) 144/74   Pulse 76   Temp 97.1 F (36.2 C)   Resp 20   Ht 5\' 5"  (1.651 m)   Wt 172 lb (78 kg)   BMI 28.62 kg/m   General:  alert, cooperative and no distress  HEENT examination reveals no scleral icterus. Lungs clear auscultation with equal breath sounds bilaterally. Heart examination reveals a regular rate and rhythm without S3, S4, murmurs. Abdomen is soft, nontender, nondistended. No hepatosplenomegaly, masses, hernias identified.     Assessment:    Lower abdominal pain of unknown etiology, no symptoms of biliary colic.    Plan:  I did tell the patient that should he continue to have this lower abdominal pain, referral by Dr. Elby Beck to a GI specialist may be indicated. I told him I did not feel that cholecystectomy is warranted at this time. The patient understands and agrees. Should he develop symptoms of biliary colic, he was instructed to return to my office.

## 2016-12-23 ENCOUNTER — Ambulatory Visit: Payer: Self-pay | Admitting: General Surgery

## 2017-01-05 ENCOUNTER — Encounter: Payer: Self-pay | Admitting: Internal Medicine

## 2017-01-13 DIAGNOSIS — E1140 Type 2 diabetes mellitus with diabetic neuropathy, unspecified: Secondary | ICD-10-CM | POA: Diagnosis not present

## 2017-01-13 DIAGNOSIS — N390 Urinary tract infection, site not specified: Secondary | ICD-10-CM | POA: Diagnosis not present

## 2017-01-13 DIAGNOSIS — E1142 Type 2 diabetes mellitus with diabetic polyneuropathy: Secondary | ICD-10-CM | POA: Diagnosis not present

## 2017-01-13 DIAGNOSIS — N342 Other urethritis: Secondary | ICD-10-CM | POA: Diagnosis not present

## 2017-01-15 ENCOUNTER — Emergency Department (HOSPITAL_COMMUNITY)
Admission: EM | Admit: 2017-01-15 | Discharge: 2017-01-15 | Disposition: A | Discharge status: Home / Self Care | Payer: Medicare HMO | Attending: Emergency Medicine | Admitting: Emergency Medicine

## 2017-01-15 ENCOUNTER — Emergency Department (HOSPITAL_COMMUNITY): Payer: Medicare HMO

## 2017-01-15 ENCOUNTER — Encounter (HOSPITAL_COMMUNITY): Payer: Self-pay | Admitting: Emergency Medicine

## 2017-01-15 DIAGNOSIS — Z87891 Personal history of nicotine dependence: Secondary | ICD-10-CM | POA: Diagnosis not present

## 2017-01-15 DIAGNOSIS — E1122 Type 2 diabetes mellitus with diabetic chronic kidney disease: Secondary | ICD-10-CM | POA: Diagnosis not present

## 2017-01-15 DIAGNOSIS — Z79899 Other long term (current) drug therapy: Secondary | ICD-10-CM | POA: Diagnosis not present

## 2017-01-15 DIAGNOSIS — N189 Chronic kidney disease, unspecified: Secondary | ICD-10-CM | POA: Diagnosis not present

## 2017-01-15 DIAGNOSIS — Z8546 Personal history of malignant neoplasm of prostate: Secondary | ICD-10-CM | POA: Diagnosis not present

## 2017-01-15 DIAGNOSIS — H9319 Tinnitus, unspecified ear: Secondary | ICD-10-CM | POA: Insufficient documentation

## 2017-01-15 DIAGNOSIS — Z7982 Long term (current) use of aspirin: Secondary | ICD-10-CM | POA: Diagnosis not present

## 2017-01-15 DIAGNOSIS — I129 Hypertensive chronic kidney disease with stage 1 through stage 4 chronic kidney disease, or unspecified chronic kidney disease: Secondary | ICD-10-CM | POA: Insufficient documentation

## 2017-01-15 DIAGNOSIS — R072 Precordial pain: Secondary | ICD-10-CM | POA: Insufficient documentation

## 2017-01-15 DIAGNOSIS — R079 Chest pain, unspecified: Secondary | ICD-10-CM | POA: Diagnosis not present

## 2017-01-15 DIAGNOSIS — I10 Essential (primary) hypertension: Secondary | ICD-10-CM | POA: Diagnosis not present

## 2017-01-15 LAB — CBC
HCT: 37.3 % — ABNORMAL LOW (ref 39.0–52.0)
Hemoglobin: 12.8 g/dL — ABNORMAL LOW (ref 13.0–17.0)
MCH: 26.9 pg (ref 26.0–34.0)
MCHC: 34.3 g/dL (ref 30.0–36.0)
MCV: 78.4 fL (ref 78.0–100.0)
Platelets: 204 10*3/uL (ref 150–400)
RBC: 4.76 MIL/uL (ref 4.22–5.81)
RDW: 14.2 % (ref 11.5–15.5)
WBC: 4.7 10*3/uL (ref 4.0–10.5)

## 2017-01-15 LAB — BASIC METABOLIC PANEL
Anion gap: 8 (ref 5–15)
BUN: 29 mg/dL — ABNORMAL HIGH (ref 6–20)
CO2: 20 mmol/L — ABNORMAL LOW (ref 22–32)
Calcium: 9.2 mg/dL (ref 8.9–10.3)
Chloride: 107 mmol/L (ref 101–111)
Creatinine, Ser: 2.23 mg/dL — ABNORMAL HIGH (ref 0.61–1.24)
GFR calc Af Amer: 31 mL/min — ABNORMAL LOW (ref >60)
GFR calc non Af Amer: 26 mL/min — ABNORMAL LOW (ref >60)
Glucose, Bld: 175 mg/dL — ABNORMAL HIGH (ref 65–99)
Potassium: 4 mmol/L (ref 3.5–5.1)
Sodium: 135 mmol/L (ref 135–145)

## 2017-01-15 LAB — TROPONIN I: Troponin I: <0.03 ng/mL (ref <0.03)

## 2017-01-15 MED ORDER — ASPIRIN 81 MG PO CHEW
324.0000 mg | CHEWABLE_TABLET | Freq: Once | ORAL | Status: DC
  Filled 2017-01-15: qty 4

## 2017-01-15 MED ORDER — ASPIRIN 81 MG PO CHEW
162.0000 mg | CHEWABLE_TABLET | Freq: Once | ORAL | Status: AC
  Administered 2017-01-15: 162 mg via ORAL

## 2017-01-15 NOTE — Discharge Instructions (Signed)

## 2017-01-15 NOTE — ED Triage Notes (Signed)
PT c/o sharp intermittent left sided chest pains that started this am around 0400. PT also states Levofloxacin 500mg  was started by his primary doctor x3 days ago but he isn't sure for what dx. PT denies any SOB or swelling to lower extremities and denies any pain at this time.

## 2017-01-15 NOTE — ED Provider Notes (Signed)
[facility] DEPT Provider Note   CSN: [id number] Arrival date & time: 01/15/17  9485   By signing my name below, I, Hilbert Odor, attest that this documentation has been prepared under the direction and in the presence of Noemi Chapel, MD. Electronically Signed: Hilbert Odor, Scribe. 01/15/17. 9:54 AM. History   Chief Complaint Chief Complaint  Patient presents with  . Chest Pain    The history is provided by the patient. No language interpreter was used.  HPI Comments: Steve [name] is a 81 y.o. male with a hx of HTN, who presents to the Emergency Department complaining of intermittent CP since 4 am this morning. He states that it feels like someone "thumped" him in the chest. His chest pain last for a few seconds and goes away. He denies any radiation of the pain. Pain worsens with twisting motion. He states that he exercises form time to time by walking and doesn't usually get SOB or CP with exertion. He states that his chest pain is currently resolved. He also reports tinnitus that developed this morning. He reports smoking and drinking in 1989 and has lived a healthy lifestyle since. He reports that his most recent stress test was negative. He had nuclear stress test in 10/30/2015. This was a low risk study. EF was 58% at that time. He has never had a stent placed. He denies cough, leg swelling, SOB, and abdominal pain.    Past Medical History:  Diagnosis Date  . Arthritis   . Cancer [facility])    prostate  . Diabetes mellitus without complication ([place])   . Hypertension     Patient Active Problem List   Diagnosis Date Noted  . Prostate cancer ([place]) 07/10/2014  . Abnormal ECG 04/12/2013  . HTN (hypertension) 04/12/2013  . SOB (shortness of breath) 04/12/2013  . CHEST PAIN-UNSPECIFIED 06/12/2009    Past Surgical History:  Procedure Laterality Date  . CATARACT EXTRACTION W/PHACO Right 04/28/2016   Procedure: CATARACT EXTRACTION PHACO AND INTRAOCULAR LENS PLACEMENT  (IOC);  Surgeon: Williams Che, MD;  Location: [facility];  Service: Ophthalmology;  Laterality: Right;  CDE: 4.28  . CYSTECTOMY         Home Medications    Prior to Admission medications   Medication Sig Start Date End Date Taking? Authorizing Provider  acetaminophen (TYLENOL) 650 MG CR tablet Take 650 mg by mouth every 8 (eight) hours as needed for pain.   Yes Historical Provider, MD  albuterol (PROVENTIL HFA;VENTOLIN HFA) 108 (90 Base) MCG/ACT inhaler Inhale 1-2 puffs into the lungs every 6 (six) hours as needed for wheezing or shortness of breath.   Yes Historical Provider, MD  aspirin 81 MG tablet Take 81 mg by mouth daily.   Yes Historical Provider, MD  Cholecalciferol (VITAMIN D-3) 1000 UNITS CAPS Take 1 capsule by mouth daily.    Yes Historical Provider, MD  furosemide (LASIX) 20 MG tablet Take 20 mg by mouth daily.   Yes Historical Provider, MD  gabapentin (NEURONTIN) 300 MG capsule Take 1 capsule by mouth 2 (two) times daily. 01/19/15  Yes Historical Provider, MD  iron polysaccharides (NIFEREX) 150 MG capsule Take 150 mg by mouth daily.   Yes Historical Provider, MD  levofloxacin (LEVAQUIN) 500 MG tablet Take 500 mg by mouth daily. For 7 days. 01/13/17 01/19/17 Yes Historical Provider, MD  linagliptin (TRADJENTA) 5 MG TABS tablet Take 5 mg by mouth daily.   Yes Historical Provider, MD  losartan (COZAAR) 100 MG tablet Take 1 tablet by mouth  daily. 04/06/13  Yes Historical Provider, MD  NIFEdipine (PROCARDIA XL/ADALAT-CC) 90 MG 24 hr tablet TAKE 1 TABLET ONCE DAILY. 08/11/16  Yes Herminio Commons, MD  nitroGLYCERIN (NITROSTAT) 0.4 MG SL tablet Place 0.4 mg under the tongue every 5 (five) minutes as needed for chest pain.   Yes Historical Provider, MD  omeprazole (PRILOSEC) 20 MG capsule Take 1 capsule by mouth 2 (two) times daily. 04/06/13  Yes Historical Provider, MD  pravastatin (PRAVACHOL) 40 MG tablet Take 1 tablet by mouth daily. 04/06/13  Yes Historical Provider, MD  tamsulosin  (FLOMAX) 0.4 MG CAPS Take 1 capsule by mouth 2 (two) times daily.  04/06/13  Yes Historical Provider, MD  traMADol (ULTRAM) 50 MG tablet Take 50 mg by mouth 2 (two) times daily.   Yes Historical Provider, MD    Family History Family History  Problem Relation Age of Onset  . Heart disease Mother     cause of death  . Diabetes Sister     Social History Social History  Substance Use Topics  . Smoking status: Former Smoker    Packs/day: 2.00    Years: 25.00    Types: Pipe, Cigars    Start date: 11/08/1954    Quit date: 10/14/1987  . Smokeless tobacco: Never Used     Comment: About 2 pipes a day  . Alcohol use No     Allergies   Patient has no known allergies.   Review of Systems Review of Systems  HENT: Positive for tinnitus.   Respiratory: Negative for cough and shortness of breath.   Cardiovascular: Positive for chest pain. Negative for leg swelling.  Gastrointestinal: Negative for abdominal pain.  Musculoskeletal: Negative for neck pain.  All other systems reviewed and are negative.   Physical Exam Updated Vital Signs BP (!) 143/69   Pulse 71   Temp 97.8 F (36.6 C) (Oral)   Resp 14   Ht 5\' 5"  (1.651 m)   Wt 176 lb (79.8 kg)   SpO2 100%   BMI 29.29 kg/m   Physical Exam  Constitutional: He appears well-developed and well-nourished. No distress.  HENT:  Head: Normocephalic and atraumatic.  Mouth/Throat: Oropharynx is clear and moist. No oropharyngeal exudate.  Eyes: Conjunctivae and EOM are normal. Pupils are equal, round, and reactive to light. Right eye exhibits no discharge. Left eye exhibits no discharge. No scleral icterus.  Neck: Normal range of motion. Neck supple. No JVD present. No thyromegaly present.  Cardiovascular: Normal rate, regular rhythm, normal heart sounds and intact distal pulses.  Exam reveals no gallop and no friction rub.   No murmur heard. Pulmonary/Chest: Effort normal and breath sounds normal. No respiratory distress. He has no  wheezes. He has no rales.  Abdominal: Soft. Bowel sounds are normal. He exhibits no distension and no mass. There is no tenderness.  Musculoskeletal: Normal range of motion. He exhibits no edema or tenderness.  Lymphadenopathy:    He has no cervical adenopathy.  Neurological: He is alert. Coordination normal.  Skin: Skin is warm and dry. No rash noted. No erythema.  Psychiatric: He has a normal mood and affect. His behavior is normal.  Nursing note and vitals reviewed.    ED Treatments / Results  DIAGNOSTIC STUDIES: Oxygen Saturation is 100% on RA, normal by my interpretation.    COORDINATION OF CARE: 9:41 AM Discussed treatment plan with pt at bedside and pt agreed to plan. I will do a full cardiac work-up for the patient. I will check his  EKG and troponin levels.  Labs (all labs ordered are listed, but only abnormal results are displayed) Labs Reviewed  BASIC METABOLIC PANEL - Abnormal; Notable for the following:       Result Value   CO2 20 (*)    Glucose, Bld 175 (*)    BUN 29 (*)    Creatinine, Ser 2.23 (*)    GFR calc non Af Amer 26 (*)    GFR calc Af Amer 31 (*)    All other components within normal limits  CBC - Abnormal; Notable for the following:    Hemoglobin 12.8 (*)    HCT 37.3 (*)    All other components within normal limits  TROPONIN I    EKG  EKG Interpretation None       Radiology Dg Chest 2 View  Result Date: 01/15/2017 CLINICAL DATA:  Left-sided chest pain. EXAM: CHEST  2 VIEW COMPARISON:  08/02/2015. FINDINGS: Mediastinum and hilar structures normal. Low lung volumes with mild basilar atelectasis and/or scarring. No pleural effusion or pneumothorax. No acute bony abnormality. IMPRESSION: 1.  Stable mild cardiomegaly.  No pulmonary venous congestion. 2. Low lung volumes with mild bibasilar atelectasis and/or scarring. No change from prior exam . Electronically Signed   By: Marcello Moores  Register   On: 01/15/2017 10:30    Procedures Procedures (including  critical care time)  Medications Ordered in ED Medications  aspirin chewable tablet 162 mg (162 mg Oral Given 01/15/17 0955)     Initial Impression / Assessment and Plan / ED Course  I have reviewed the triage vital signs and the nursing notes.  Pertinent labs & imaging results that were available during my care of the patient were reviewed by me and considered in my medical decision making (see chart for details).  Clinical Course as of Jan 15 1053  Thu Jan 15, 2017  1049 Labs reviewed - unchanged renal function Trop negative,   [BM]  1049 Stable mild cardiomegaly - no other pul findings on xray    [BM]  1050 Pt informed of results Low risk for ACS given nature of pain with movement - neg stress in last 15 months Non exertional pain with neg ECG Pt given return precautions  [BM]    Clinical Course User Index [BM] Noemi Chapel, MD   ED ECG REPORT  I personally interpreted this EKG   Date: 01/15/2017   Rate: 78  Rhythm: normal sinus rhythm  QRS Axis: left  Intervals: normal  ST/T Wave abnormalities: normal  Conduction Disutrbances:nonspecific intraventricular conduction delay  Narrative Interpretation:   Old EKG Reviewed: none available   Final Clinical Impressions(s) / ED Diagnoses   Final diagnoses:  Precordial chest pain  Chronic renal impairment, unspecified CKD stage    New Prescriptions New Prescriptions   No medications on file   I personally performed the services described in this documentation, which was scribed in my presence. The recorded information has been reviewed and is accurate.       Noemi Chapel, MD 01/15/17 1054

## 2017-01-27 DIAGNOSIS — I10 Essential (primary) hypertension: Secondary | ICD-10-CM | POA: Diagnosis not present

## 2017-01-27 DIAGNOSIS — N184 Chronic kidney disease, stage 4 (severe): Secondary | ICD-10-CM | POA: Diagnosis not present

## 2017-01-27 DIAGNOSIS — E1142 Type 2 diabetes mellitus with diabetic polyneuropathy: Secondary | ICD-10-CM | POA: Diagnosis not present

## 2017-01-27 DIAGNOSIS — K219 Gastro-esophageal reflux disease without esophagitis: Secondary | ICD-10-CM | POA: Diagnosis not present

## 2017-01-28 ENCOUNTER — Encounter: Payer: Self-pay | Admitting: Nurse Practitioner

## 2017-01-28 ENCOUNTER — Ambulatory Visit (INDEPENDENT_AMBULATORY_CARE_PROVIDER_SITE_OTHER): Payer: Medicare HMO | Admitting: Nurse Practitioner

## 2017-01-28 DIAGNOSIS — K581 Irritable bowel syndrome with constipation: Secondary | ICD-10-CM | POA: Diagnosis not present

## 2017-01-28 DIAGNOSIS — K589 Irritable bowel syndrome without diarrhea: Secondary | ICD-10-CM | POA: Insufficient documentation

## 2017-01-28 NOTE — Patient Instructions (Signed)
1. Stop taking milk of magnesia and other laxatives. 2. I am giving you samples of Linzess 72 g. Take this once a day, on an empty stomach. 3. Call us in 1-2 weeks and let us know if the medicine is helping you have softer, more complete bowel movements. 4. Let us know if it is helping her abdominal pain as well. 5. Return for follow-up in 2 months.

## 2017-01-28 NOTE — Assessment & Plan Note (Signed)
Abdominal pain likely IBS constipation type. The patient typically takes milk of magnesia frequently due to hard stools requiring straining. Lower abdominal pain which improves after a good bowel movement. At this point I will start him on Linzess 72 g daily on an empty stomach. I will provide samples to last 1-2 weeks. I am requesting a progress report in 1-2 weeks. We can dose adjust or change agents based on his clinical response. Return for follow-up in 2 months.

## 2017-01-28 NOTE — Progress Notes (Signed)
Primary Care Physician:  Rosita Fire, MD Primary Gastroenterologist:  Dr. Gala Romney  Chief Complaint  Patient presents with  . Abdominal Pain    HPI:   Steve [name] is a 81 y.o. male who presents For evaluation of abdominal pain. PCP notes reviewed including imaging. CT of the abdomen and pelvis completed 10/24/2016 found scattered colonic diverticula without diverticulitis, no bowel wall thickening or obstruction, no abscess, appendix normal, gallbladder slightly distended without wall thickening and question tiny calculi. Right upper quadrant ultrasound completed 10/31/2016 found gallbladder contained a large amount of sludge, wall slightly thickened which coated go along with symptomatic chronic inflammation. The patient saw surgery who noted abdominal pain with greasy foods, and intermittent right upper quadrant abdominal pain although incidence has decreased. Also with lower abdominal pain and burping which is relieved by passing flatus. He was offered cholecystectomy but decided to hold off for now as his lower abdominal pain is more pressing in his opinion then his right upper quadrant pain.  Today he states hsi concern today is lower abdominal pain. Overall doing well. Will have lower abdominal burning pain; has had 3 "bad attacks" for which he went to the ER (last ER 03/28/16). Majority of the time his pain is mild to moderate. Has a bowel movement daily but tends to be a little on the hard side; will use Milk of Magnesia to help have a bowel movement. Frequently has to strain. Feels his pain improves after a bowel movement. Denies hematohezia, N/V. Has dar stools which are formed/hard (is on Iron). Denies fever, chills, unintentional weight loss (has lost 6 lbs in the past year with concerted effort to exercise), acute changes in bowel habits/consistency. Has some baseline dyspnea (no worse than baseline). Denies chest pain, dizziness, lightheadedness, syncope, near syncope. Denies any  other upper or lower GI symptoms.  Last colonoscopy about 2 years ago in [place], [place]. Cannot remember recommendation for repeat exam.  Past Medical History:  Diagnosis Date  . Arthritis   . Cancer [facility])    prostate  . Diabetes mellitus without complication ([place])   . Hypertension     Past Surgical History:  Procedure Laterality Date  . CATARACT EXTRACTION W/PHACO Right 04/28/2016   Procedure: CATARACT EXTRACTION PHACO AND INTRAOCULAR LENS PLACEMENT (IOC);  Surgeon: Williams Che, MD;  Location: [facility];  Service: Ophthalmology;  Laterality: Right;  CDE: 4.28  . COLONOSCOPY     about 2 years in [place]   . CYSTECTOMY      Current Outpatient Prescriptions  Medication Sig Dispense Refill  . acetaminophen (TYLENOL) 650 MG CR tablet Take 650 mg by mouth every 8 (eight) hours as needed for pain.    Marland Kitchen albuterol (PROVENTIL HFA;VENTOLIN HFA) 108 (90 Base) MCG/ACT inhaler Inhale 1-2 puffs into the lungs every 6 (six) hours as needed for wheezing or shortness of breath.    Marland Kitchen aspirin 81 MG tablet Take 81 mg by mouth daily.    . Cholecalciferol (VITAMIN D-3) 1000 UNITS CAPS Take 1 capsule by mouth daily.     Marland Kitchen docusate sodium (COLACE) 100 MG capsule Take 100 mg by mouth 2 (two) times daily.    . furosemide (LASIX) 20 MG tablet Take 20 mg by mouth daily.    Marland Kitchen gabapentin (NEURONTIN) 300 MG capsule Take 1 capsule by mouth 2 (two) times daily.    . iron polysaccharides (NIFEREX) 150 MG capsule Take 150 mg by mouth daily.    Marland Kitchen linagliptin (TRADJENTA) 5 MG TABS tablet Take  5 mg by mouth daily.    Marland Kitchen losartan (COZAAR) 100 MG tablet Take 1 tablet by mouth daily.    Marland Kitchen Lysine 500 MG CAPS Take 500 mg by mouth daily.    Marland Kitchen NIFEdipine (PROCARDIA XL/ADALAT-CC) 90 MG 24 hr tablet TAKE 1 TABLET ONCE DAILY. 30 tablet 6  . nitroGLYCERIN (NITROSTAT) 0.4 MG SL tablet Place 0.4 mg under the tongue every 5 (five) minutes as needed for chest pain.    Marland Kitchen omeprazole (PRILOSEC) 20 MG capsule Take 1 capsule by mouth 2 (two)  times daily.    . pravastatin (PRAVACHOL) 40 MG tablet Take 1 tablet by mouth daily.    . tamsulosin (FLOMAX) 0.4 MG CAPS Take 1 capsule by mouth 2 (two) times daily.      No current facility-administered medications for this visit.     Allergies as of 01/28/2017  . (No Known Allergies)    Family History  Problem Relation Age of Onset  . Heart disease Mother     cause of death  . Diabetes Sister   . Colon cancer Neg Hx     Social History   Social History  . Marital status: Divorced    Spouse name: N/A  . Number of children: N/A  . Years of education: N/A   Occupational History  . Retired    Social History Main Topics  . Smoking status: Former Smoker    Packs/day: 2.00    Years: 25.00    Types: Pipe, Cigars    Start date: 11/08/1954    Quit date: 10/14/1987  . Smokeless tobacco: Never Used     Comment: About 2 pipes a day  . Alcohol use No  . Drug use: No  . Sexual activity: Not on file   Other Topics Concern  . Not on file   Social History Narrative  . No narrative on file    Review of Systems: General: Negative for anorexia, weight loss, fever, chills, fatigue, weakness. ENT: Negative for hoarseness, difficulty swallowing. CV: Negative for chest pain, angina, palpitations, peripheral edema.  Respiratory: Negative for dyspnea at rest, cough, sputum, wheezing.  GI: See history of present illness. MS: Negative for chronic acute pain.  Derm: Negative for rash or itching.  Endo: Negative for unusual weight change.  Heme: Negative for bruising or bleeding. Allergy: Negative for rash or hives.    Physical Exam: BP 130/63   Pulse 78   Temp 97.6 F (36.4 C) (Oral)   Ht 5\' 5"  (1.651 m)   Wt 174 lb (78.9 kg)   BMI 28.96 kg/m  General:   Alert and oriented. Pleasant and cooperative. Well-nourished and well-developed.  Head:  Normocephalic and atraumatic. Eyes:  Without icterus, sclera clear and conjunctiva pink.  Ears:  Normal auditory  acuity. Cardiovascular:  S1, S2 present without murmurs appreciated. Extremities without clubbing or edema. Respiratory:  Clear to auscultation bilaterally. No wheezes, rales, or rhonchi. No distress.  Gastrointestinal:  +BS, soft, non-tender and non-distended. No HSM noted. No guarding or rebound. No masses appreciated.  Rectal:  Deferred  Musculoskalatal:  Symmetrical without gross deformities. Neurologic:  Alert and oriented x4;  grossly normal neurologically. Psych:  Alert and cooperative. Normal mood and affect. Heme/Lymph/Immune: No significant cervical adenopathy. No excessive bruising noted.    01/28/2017 1:53 PM   Disclaimer: This note was dictated with voice recognition software. Similar sounding words can inadvertently be transcribed and may not be corrected upon review.

## 2017-01-28 NOTE — Progress Notes (Signed)
cc'ed to pcp °

## 2017-02-02 ENCOUNTER — Emergency Department (HOSPITAL_COMMUNITY)
Admission: EM | Admit: 2017-02-02 | Discharge: 2017-02-02 | Disposition: A | Discharge status: Home / Self Care | Payer: Medicare HMO | Attending: Emergency Medicine | Admitting: Emergency Medicine

## 2017-02-02 ENCOUNTER — Encounter (HOSPITAL_COMMUNITY): Payer: Self-pay | Admitting: Emergency Medicine

## 2017-02-02 DIAGNOSIS — R369 Urethral discharge, unspecified: Secondary | ICD-10-CM | POA: Diagnosis not present

## 2017-02-02 DIAGNOSIS — Z7982 Long term (current) use of aspirin: Secondary | ICD-10-CM | POA: Insufficient documentation

## 2017-02-02 DIAGNOSIS — Z8546 Personal history of malignant neoplasm of prostate: Secondary | ICD-10-CM | POA: Insufficient documentation

## 2017-02-02 DIAGNOSIS — R1030 Lower abdominal pain, unspecified: Secondary | ICD-10-CM | POA: Insufficient documentation

## 2017-02-02 DIAGNOSIS — R350 Frequency of micturition: Secondary | ICD-10-CM | POA: Diagnosis not present

## 2017-02-02 DIAGNOSIS — Z202 Contact with and (suspected) exposure to infections with a predominantly sexual mode of transmission: Secondary | ICD-10-CM | POA: Diagnosis not present

## 2017-02-02 DIAGNOSIS — Z87891 Personal history of nicotine dependence: Secondary | ICD-10-CM | POA: Insufficient documentation

## 2017-02-02 DIAGNOSIS — Z79899 Other long term (current) drug therapy: Secondary | ICD-10-CM | POA: Diagnosis not present

## 2017-02-02 DIAGNOSIS — I10 Essential (primary) hypertension: Secondary | ICD-10-CM | POA: Diagnosis not present

## 2017-02-02 DIAGNOSIS — E119 Type 2 diabetes mellitus without complications: Secondary | ICD-10-CM | POA: Insufficient documentation

## 2017-02-02 LAB — URINALYSIS, ROUTINE W REFLEX MICROSCOPIC
Bilirubin Urine: NEGATIVE
Glucose, UA: NEGATIVE mg/dL
Hgb urine dipstick: NEGATIVE
Ketones, ur: NEGATIVE mg/dL
Leukocytes, UA: NEGATIVE
Nitrite: NEGATIVE
Protein, ur: 30 mg/dL — AB
Specific Gravity, Urine: 1.003 — ABNORMAL LOW (ref 1.005–1.030)
Squamous Epithelial / LPF: NONE SEEN
pH: 6 (ref 5.0–8.0)

## 2017-02-02 MED ORDER — CEFTRIAXONE SODIUM 250 MG IJ SOLR
250.0000 mg | Freq: Once | INTRAMUSCULAR | Status: AC
  Administered 2017-02-02: 250 mg via INTRAMUSCULAR
  Filled 2017-02-02: qty 250

## 2017-02-02 MED ORDER — AZITHROMYCIN 250 MG PO TABS
1000.0000 mg | ORAL_TABLET | Freq: Once | ORAL | Status: AC
  Administered 2017-02-02: 1000 mg via ORAL
  Filled 2017-02-02: qty 4

## 2017-02-02 MED ORDER — LIDOCAINE HCL (PF) 1 % IJ SOLN
2.0000 mL | Freq: Once | INTRAMUSCULAR | Status: AC
  Administered 2017-02-02: 2 mL via INTRADERMAL
  Filled 2017-02-02: qty 5

## 2017-02-02 NOTE — Discharge Instructions (Signed)
Please read and follow all provided instructions.  Your diagnoses today include:  1. STD exposure   2. Penile discharge     Tests performed today include: Vital signs. See below for your results today.   Medications prescribed:  Take as prescribed   Home care instructions:  Follow any educational materials contained in this packet.  Follow-up instructions: Please follow-up with your primary care provider for further evaluation of symptoms and treatment   Return instructions:  Please return to the Emergency Department if you do not get better, if you get worse, or new symptoms OR  - Fever (temperature greater than 101.42F)  - Bleeding that does not stop with holding pressure to the area    -Severe pain (please note that you may be more sore the day after your accident)  - Chest Pain  - Difficulty breathing  - Severe nausea or vomiting  - Inability to tolerate food and liquids  - Passing out  - Skin becoming red around your wounds  - Change in mental status (confusion or lethargy)  - New numbness or weakness    Please return if you have any other emergent concerns.  Additional Information:  Your vital signs today were: BP (!) 189/80 (BP Location: Left Arm)    Pulse 92    Temp 98.2 F (36.8 C) (Oral)    Resp 18    Ht 5\' 5"  (1.651 m)    Wt 78.9 kg    SpO2 99%    BMI 28.96 kg/m  If your blood pressure (BP) was elevated above 135/85 this visit, please have this repeated by your doctor within one month. ---------------

## 2017-02-02 NOTE — ED Provider Notes (Signed)
[facility] DEPT Provider Note   CSN: [id number] Arrival date & time: 02/02/17  1422  By signing my name below, I, Collene Leyden, attest that this documentation has been prepared under the direction and in the presence of Shary Decamp, PA-C. Electronically Signed: Collene Leyden, Scribe. 02/02/17. 2:44 PM.  History   Chief Complaint Chief Complaint  Patient presents with  . Groin Pain    HPI Comments: Steve [name] is a 81 y.o. male with a history of prostate cancer and IBS, who presents to the Emergency Department complaining of sudden-onset clear penile discharge that began a few days ago. Patient reports unprotected intercourse with new partners for the past week. Patient states is symptoms have significantly worsened in the past week. Patient reports being seen by his primary physician, in which his urine specimen was negative x 2 weeks ago. But symptoms occurred several days ago. Notes hx same with GC. Patient reports associated urinary frequency, urinary urgency, suprapubic abdominal pain, groin "soreness". No modifying factors indicated. Patient denies any testicle swelling, penile rash, dysuria, hematuria, nausea, vomiting, diarrhea, constipation, fever, or chills.   The history is provided by the patient. No language interpreter was used.    Past Medical History:  Diagnosis Date  . Arthritis   . Cancer [facility])    prostate  . Diabetes mellitus without complication ([place])   . Hypertension     Patient Active Problem List   Diagnosis Date Noted  . IBS (irritable bowel syndrome) 01/28/2017  . Prostate cancer ([place]) 07/10/2014  . Abnormal ECG 04/12/2013  . HTN (hypertension) 04/12/2013  . SOB (shortness of breath) 04/12/2013  . CHEST PAIN-UNSPECIFIED 06/12/2009    Past Surgical History:  Procedure Laterality Date  . CATARACT EXTRACTION W/PHACO Right 04/28/2016   Procedure: CATARACT EXTRACTION PHACO AND INTRAOCULAR LENS PLACEMENT (IOC);  Surgeon: Williams Che, MD;   Location: [facility] ORS;  Service: Ophthalmology;  Laterality: Right;  CDE: 4.28  . COLONOSCOPY     about 2 years in [place]   . CYSTECTOMY         Home Medications    Prior to Admission medications   Medication Sig Start Date End Date Taking? Authorizing Provider  acetaminophen (TYLENOL) 650 MG CR tablet Take 650 mg by mouth every 8 (eight) hours as needed for pain.    Historical Provider, MD  albuterol (PROVENTIL HFA;VENTOLIN HFA) 108 (90 Base) MCG/ACT inhaler Inhale 1-2 puffs into the lungs every 6 (six) hours as needed for wheezing or shortness of breath.    Historical Provider, MD  aspirin 81 MG tablet Take 81 mg by mouth daily.    Historical Provider, MD  Cholecalciferol (VITAMIN D-3) 1000 UNITS CAPS Take 1 capsule by mouth daily.     Historical Provider, MD  docusate sodium (COLACE) 100 MG capsule Take 100 mg by mouth 2 (two) times daily.    Historical Provider, MD  furosemide (LASIX) 20 MG tablet Take 20 mg by mouth daily.    Historical Provider, MD  gabapentin (NEURONTIN) 300 MG capsule Take 1 capsule by mouth 2 (two) times daily. 01/19/15   Historical Provider, MD  iron polysaccharides (NIFEREX) 150 MG capsule Take 150 mg by mouth daily.    Historical Provider, MD  linagliptin (TRADJENTA) 5 MG TABS tablet Take 5 mg by mouth daily.    Historical Provider, MD  losartan (COZAAR) 100 MG tablet Take 1 tablet by mouth daily. 04/06/13   Historical Provider, MD  Lysine 500 MG CAPS Take 500 mg by mouth daily.  Historical Provider, MD  NIFEdipine (PROCARDIA XL/ADALAT-CC) 90 MG 24 hr tablet TAKE 1 TABLET ONCE DAILY. 08/11/16   Herminio Commons, MD  nitroGLYCERIN (NITROSTAT) 0.4 MG SL tablet Place 0.4 mg under the tongue every 5 (five) minutes as needed for chest pain.    Historical Provider, MD  omeprazole (PRILOSEC) 20 MG capsule Take 1 capsule by mouth 2 (two) times daily. 04/06/13   Historical Provider, MD  pravastatin (PRAVACHOL) 40 MG tablet Take 1 tablet by mouth daily. 04/06/13   Historical  Provider, MD  tamsulosin (FLOMAX) 0.4 MG CAPS Take 1 capsule by mouth 2 (two) times daily.  04/06/13   Historical Provider, MD    Family History Family History  Problem Relation Age of Onset  . Heart disease Mother     cause of death  . Diabetes Sister   . Colon cancer Neg Hx     Social History Social History  Substance Use Topics  . Smoking status: Former Smoker    Packs/day: 2.00    Years: 25.00    Types: Pipe, Cigars    Start date: 11/08/1954    Quit date: 10/14/1987  . Smokeless tobacco: Never Used     Comment: About 2 pipes a day  . Alcohol use No     Allergies   Patient has no known allergies.   Review of Systems Review of Systems  Constitutional: Negative for chills and fever.  Gastrointestinal: Positive for abdominal pain (suprapubic ). Negative for constipation, diarrhea, nausea and vomiting.  Genitourinary: Positive for discharge, frequency, penile pain and urgency. Negative for dysuria, hematuria, penile swelling, scrotal swelling and testicular pain.  Skin: Negative for rash.   Physical Exam Updated Vital Signs BP (!) 189/80 (BP Location: Left Arm)   Pulse 92   Temp 98.2 F (36.8 C) (Oral)   Resp 18   Ht 5\' 5"  (1.651 m)   Wt 174 lb (78.9 kg)   SpO2 99%   BMI 28.96 kg/m   Physical Exam  Constitutional: He is oriented to person, place, and time. He appears well-developed and well-nourished.  HENT:  Head: Normocephalic and atraumatic.  Mouth/Throat: Oropharynx is clear and moist.  Eyes: Conjunctivae and EOM are normal. Pupils are equal, round, and reactive to light.  Neck: Normal range of motion. Neck supple.  Cardiovascular: Normal rate and regular rhythm.   Pulmonary/Chest: Effort normal and breath sounds normal.  Abdominal: Soft. Bowel sounds are normal. He exhibits no distension. There is no tenderness.  No CVA tenderness. TTP to the suprapubic area.   Genitourinary:  Genitourinary Comments: Chaperone Present. Minimal discharge noted from  urethra. No erythema. No swelling. No testicular pain/swelling.   Musculoskeletal: Normal range of motion.  Neurological: He is alert and oriented to person, place, and time.  Skin: Skin is warm and dry.  Psychiatric: He has a normal mood and affect.  Nursing note and vitals reviewed.  ED Treatments / Results  DIAGNOSTIC STUDIES: Oxygen Saturation is 99% on RA, normal by my interpretation.    COORDINATION OF CARE: 2:44 PM Discussed treatment plan with pt at bedside and pt agreed to plan, which includes antibiotics.   Labs (all labs ordered are listed, but only abnormal results are displayed) Labs Reviewed - No data to display  EKG  EKG Interpretation None       Radiology No results found.  Procedures Procedures (including critical care time)  Medications Ordered in ED Medications  azithromycin (ZITHROMAX) tablet 1,000 mg (not administered)  cefTRIAXone (ROCEPHIN) injection 250  mg (not administered)  lidocaine (PF) (XYLOCAINE) 1 % injection 2 mL (not administered)   Initial Impression / Assessment and Plan / ED Course  I have reviewed the triage vital signs and the nursing notes.  Pertinent labs & imaging results that were available during my care of the patient were reviewed by me and considered in my medical decision making (see chart for details).  Final Clinical Impressions(s) / ED Diagnoses  {I have reviewed and evaluated the relevant laboratory values.   {I have reviewed the relevant previous healthcare records.  {I obtained HPI from historian.   ED Course:  Assessment: Pt is a 81 y.o. male who presents with penile discharge x several days. Recent unprotected sexual intercourse with new partner. No fevers. Mild abdominal discomfort x 2 weeks. Seen by PCP for thsi and thought to be IBS. Relief with BMs. Notes no dysuria. On exam, pt in NAD. Nontoxic/nonseptic appearing. VSS. Afebrile. Lungs CTA. Heart RRR. Abdomen nontender soft. GU exam unremarkable for swelling or  erythema. No pain. Discharge noted.. Given rocephin/zazithro in ED. UA negative. Plan is to DC home with follow up to PCP. At time of discharge, Patient is in no acute distress. Vital Signs are stable. Patient is able to ambulate. Patient able to tolerate PO.   Disposition/Plan: DC Home Additional Verbal discharge instructions given and discussed with patient.  Pt Instructed to f/u with PCP in the next week for evaluation and treatment of symptoms. Return precautions given Pt acknowledges and agrees with plan  Supervising Physician Merrily Pew, MD  Final diagnoses:  STD exposure  Penile discharge    New Prescriptions New Prescriptions   No medications on file    I personally performed the services described in this documentation, which was scribed in my presence. The recorded information has been reviewed and is accurate.    Shary Decamp, PA-C 02/02/17 [place], MD 02/02/17 1520

## 2017-02-02 NOTE — ED Notes (Signed)
Pt made aware to return if symptoms worsen or if any life threatening symptoms occur.   

## 2017-02-02 NOTE — ED Triage Notes (Signed)
PT c/o pain to groin with some penile discharge with new sexual partners x1 week. PT states he seen by his PCP and had a urine specimen done but it was negative. PT drove self to ED today.

## 2017-02-03 DIAGNOSIS — I129 Hypertensive chronic kidney disease with stage 1 through stage 4 chronic kidney disease, or unspecified chronic kidney disease: Secondary | ICD-10-CM | POA: Diagnosis not present

## 2017-02-03 DIAGNOSIS — R809 Proteinuria, unspecified: Secondary | ICD-10-CM | POA: Diagnosis not present

## 2017-02-03 DIAGNOSIS — E559 Vitamin D deficiency, unspecified: Secondary | ICD-10-CM | POA: Diagnosis not present

## 2017-02-03 DIAGNOSIS — D509 Iron deficiency anemia, unspecified: Secondary | ICD-10-CM | POA: Diagnosis not present

## 2017-02-03 DIAGNOSIS — Z79899 Other long term (current) drug therapy: Secondary | ICD-10-CM | POA: Diagnosis not present

## 2017-02-03 DIAGNOSIS — N183 Chronic kidney disease, stage 3 (moderate): Secondary | ICD-10-CM | POA: Diagnosis not present

## 2017-02-03 LAB — GC/CHLAMYDIA PROBE AMP (~~LOC~~) NOT AT ARMC
Chlamydia: NEGATIVE
Neisseria Gonorrhea: NEGATIVE

## 2017-02-06 DIAGNOSIS — N419 Inflammatory disease of prostate, unspecified: Secondary | ICD-10-CM | POA: Diagnosis not present

## 2017-02-06 DIAGNOSIS — E1142 Type 2 diabetes mellitus with diabetic polyneuropathy: Secondary | ICD-10-CM | POA: Diagnosis not present

## 2017-02-10 ENCOUNTER — Encounter (HOSPITAL_COMMUNITY): Payer: Self-pay | Admitting: Emergency Medicine

## 2017-02-10 ENCOUNTER — Emergency Department (HOSPITAL_COMMUNITY): Payer: Medicare HMO

## 2017-02-10 ENCOUNTER — Emergency Department (HOSPITAL_COMMUNITY)
Admission: EM | Admit: 2017-02-10 | Discharge: 2017-02-10 | Disposition: A | Discharge status: Home / Self Care | Payer: Medicare HMO | Attending: Emergency Medicine | Admitting: Emergency Medicine

## 2017-02-10 DIAGNOSIS — N281 Cyst of kidney, acquired: Secondary | ICD-10-CM | POA: Diagnosis not present

## 2017-02-10 DIAGNOSIS — E119 Type 2 diabetes mellitus without complications: Secondary | ICD-10-CM | POA: Diagnosis not present

## 2017-02-10 DIAGNOSIS — E1129 Type 2 diabetes mellitus with other diabetic kidney complication: Secondary | ICD-10-CM | POA: Diagnosis not present

## 2017-02-10 DIAGNOSIS — N50811 Right testicular pain: Secondary | ICD-10-CM | POA: Insufficient documentation

## 2017-02-10 DIAGNOSIS — Z79899 Other long term (current) drug therapy: Secondary | ICD-10-CM | POA: Insufficient documentation

## 2017-02-10 DIAGNOSIS — Z87891 Personal history of nicotine dependence: Secondary | ICD-10-CM | POA: Insufficient documentation

## 2017-02-10 DIAGNOSIS — I10 Essential (primary) hypertension: Secondary | ICD-10-CM | POA: Insufficient documentation

## 2017-02-10 DIAGNOSIS — N183 Chronic kidney disease, stage 3 (moderate): Secondary | ICD-10-CM | POA: Diagnosis not present

## 2017-02-10 DIAGNOSIS — Z7982 Long term (current) use of aspirin: Secondary | ICD-10-CM | POA: Diagnosis not present

## 2017-02-10 DIAGNOSIS — Z8546 Personal history of malignant neoplasm of prostate: Secondary | ICD-10-CM | POA: Insufficient documentation

## 2017-02-10 DIAGNOSIS — N5082 Scrotal pain: Secondary | ICD-10-CM | POA: Diagnosis not present

## 2017-02-10 DIAGNOSIS — R809 Proteinuria, unspecified: Secondary | ICD-10-CM | POA: Diagnosis not present

## 2017-02-10 DIAGNOSIS — N50819 Testicular pain, unspecified: Secondary | ICD-10-CM

## 2017-02-10 DIAGNOSIS — K5730 Diverticulosis of large intestine without perforation or abscess without bleeding: Secondary | ICD-10-CM | POA: Diagnosis not present

## 2017-02-10 DIAGNOSIS — N433 Hydrocele, unspecified: Secondary | ICD-10-CM | POA: Diagnosis not present

## 2017-02-10 DIAGNOSIS — N503 Cyst of epididymis: Secondary | ICD-10-CM | POA: Diagnosis not present

## 2017-02-10 LAB — COMPREHENSIVE METABOLIC PANEL
ALT: 17 U/L (ref 17–63)
AST: 21 U/L (ref 15–41)
Albumin: 3.9 g/dL (ref 3.5–5.0)
Alkaline Phosphatase: 62 U/L (ref 38–126)
Anion gap: 6 (ref 5–15)
BUN: 27 mg/dL — ABNORMAL HIGH (ref 6–20)
CO2: 23 mmol/L (ref 22–32)
Calcium: 9.1 mg/dL (ref 8.9–10.3)
Chloride: 106 mmol/L (ref 101–111)
Creatinine, Ser: 2.04 mg/dL — ABNORMAL HIGH (ref 0.61–1.24)
GFR calc Af Amer: 34 mL/min — ABNORMAL LOW (ref >60)
GFR calc non Af Amer: 29 mL/min — ABNORMAL LOW (ref >60)
Glucose, Bld: 122 mg/dL — ABNORMAL HIGH (ref 65–99)
Potassium: 4.1 mmol/L (ref 3.5–5.1)
Sodium: 135 mmol/L (ref 135–145)
Total Bilirubin: 0.5 mg/dL (ref 0.3–1.2)
Total Protein: 7.2 g/dL (ref 6.5–8.1)

## 2017-02-10 LAB — CBC WITH DIFFERENTIAL/PLATELET
Basophils Absolute: 0 10*3/uL (ref 0.0–0.1)
Basophils Relative: 0 %
Eosinophils Absolute: 0.1 10*3/uL (ref 0.0–0.7)
Eosinophils Relative: 2 %
HCT: 36 % — ABNORMAL LOW (ref 39.0–52.0)
Hemoglobin: 12.4 g/dL — ABNORMAL LOW (ref 13.0–17.0)
Lymphocytes Relative: 26 %
Lymphs Abs: 1.1 10*3/uL (ref 0.7–4.0)
MCH: 26.9 pg (ref 26.0–34.0)
MCHC: 34.4 g/dL (ref 30.0–36.0)
MCV: 78.1 fL (ref 78.0–100.0)
Monocytes Absolute: 0.5 10*3/uL (ref 0.1–1.0)
Monocytes Relative: 12 %
Neutro Abs: 2.5 10*3/uL (ref 1.7–7.7)
Neutrophils Relative %: 60 %
Platelets: 168 10*3/uL (ref 150–400)
RBC: 4.61 MIL/uL (ref 4.22–5.81)
RDW: 13.5 % (ref 11.5–15.5)
WBC: 4.2 10*3/uL (ref 4.0–10.5)

## 2017-02-10 LAB — URINALYSIS, ROUTINE W REFLEX MICROSCOPIC
Bacteria, UA: NONE SEEN
Bilirubin Urine: NEGATIVE
Glucose, UA: NEGATIVE mg/dL
Hgb urine dipstick: NEGATIVE
Ketones, ur: NEGATIVE mg/dL
Leukocytes, UA: NEGATIVE
Nitrite: NEGATIVE
Protein, ur: 30 mg/dL — AB
RBC / HPF: NONE SEEN RBC/hpf (ref 0–5)
Specific Gravity, Urine: 1.005 (ref 1.005–1.030)
Squamous Epithelial / LPF: NONE SEEN
WBC, UA: NONE SEEN WBC/hpf (ref 0–5)
pH: 5 (ref 5.0–8.0)

## 2017-02-10 NOTE — ED Triage Notes (Signed)
Pt states he has continued to have pain in his lower abdomen and groin for the past 3 weeks.  States he was seen by pcp this morning and sent here.

## 2017-02-10 NOTE — ED Notes (Signed)
Pt transported to [facility]

## 2017-02-10 NOTE — ED Provider Notes (Signed)
[facility] DEPT Provider Note   CSN: [id number] Arrival date & time: 02/10/17  1030  By signing my name below, I, Sonum Patel, attest that this documentation has been prepared under the direction and in the presence of Merrily Pew, MD. Electronically Signed: Sonum Patel, Education administrator. 02/10/17. 11:36 AM.  History   Chief Complaint Chief Complaint  Patient presents with  . Testicle Pain    The history is provided by the patient. No language interpreter was used.    HPI Comments: Steve [name] is a 81 y.o. male who presents to the Emergency Department complaining of intermittent bilateral testicular and scrotal pain that has been ongoing for the last few weeks. He reports associated right sided testicular swelling, clear penile discharge, and occasional penile pain for the same period of time. He reports recent unprotected intercourse 1+ month ago but had negative STD testing. He denies recent weight loss or gain, diarrhea, constipation, leg swelling. He reports a history prostate cancer with radiation treatment in 2015; he denies chemotherapy. He has a history of chronic kidney disease. He denies history of hernias. He was seen by his kidney specialist earlier today who advised he follow up in the ED. He does not have a urologist currently. He has a history of DM, chronic kidney disease.   Past Medical History:  Diagnosis Date  . Arthritis   . Cancer [facility])    prostate  . Diabetes mellitus without complication ([place])   . Hypertension     Patient Active Problem List   Diagnosis Date Noted  . IBS (irritable bowel syndrome) 01/28/2017  . Prostate cancer ([place]) 07/10/2014  . Abnormal ECG 04/12/2013  . HTN (hypertension) 04/12/2013  . SOB (shortness of breath) 04/12/2013  . CHEST PAIN-UNSPECIFIED 06/12/2009    Past Surgical History:  Procedure Laterality Date  . CATARACT EXTRACTION W/PHACO Right 04/28/2016   Procedure: CATARACT EXTRACTION PHACO AND INTRAOCULAR LENS PLACEMENT (IOC);   Surgeon: Williams Che, MD;  Location: [facility];  Service: Ophthalmology;  Laterality: Right;  CDE: 4.28  . COLONOSCOPY     about 2 years in [place]   . CYSTECTOMY         Home Medications    Prior to Admission medications   Medication Sig Start Date End Date Taking? Authorizing Provider  acetaminophen (TYLENOL) 650 MG CR tablet Take 650 mg by mouth every 8 (eight) hours as needed for pain.   Yes Historical Provider, MD  albuterol (PROVENTIL HFA;VENTOLIN HFA) 108 (90 Base) MCG/ACT inhaler Inhale 1-2 puffs into the lungs every 6 (six) hours as needed for wheezing or shortness of breath.   Yes Historical Provider, MD  aspirin 81 MG tablet Take 81 mg by mouth daily.   Yes Historical Provider, MD  Cholecalciferol (VITAMIN D-3) 1000 UNITS CAPS Take 1 capsule by mouth daily.    Yes Historical Provider, MD  docusate sodium (COLACE) 100 MG capsule Take 100 mg by mouth 2 (two) times daily.   Yes Historical Provider, MD  furosemide (LASIX) 20 MG tablet Take 20 mg by mouth daily as needed for fluid.   Yes Historical Provider, MD  gabapentin (NEURONTIN) 300 MG capsule Take 1 capsule by mouth 2 (two) times daily. 01/19/15  Yes Historical Provider, MD  iron polysaccharides (NIFEREX) 150 MG capsule Take 150 mg by mouth daily.   Yes Historical Provider, MD  linagliptin (TRADJENTA) 5 MG TABS tablet Take 5 mg by mouth daily.   Yes Historical Provider, MD  losartan (COZAAR) 100 MG tablet Take 1 tablet  by mouth daily. 04/06/13  Yes Historical Provider, MD  Lysine 500 MG CAPS Take 500 mg by mouth daily.   Yes Historical Provider, MD  NIFEdipine (PROCARDIA XL/ADALAT-CC) 90 MG 24 hr tablet TAKE 1 TABLET ONCE DAILY. 08/11/16  Yes Herminio Commons, MD  nitroGLYCERIN (NITROSTAT) 0.4 MG SL tablet Place 0.4 mg under the tongue every 5 (five) minutes as needed for chest pain.   Yes Historical Provider, MD  omeprazole (PRILOSEC) 20 MG capsule Take 1 capsule by mouth 2 (two) times daily. 04/06/13  Yes Historical Provider,  MD  pravastatin (PRAVACHOL) 40 MG tablet Take 1 tablet by mouth daily. 04/06/13  Yes Historical Provider, MD  tamsulosin (FLOMAX) 0.4 MG CAPS Take 1 capsule by mouth 2 (two) times daily.  04/06/13  Yes Historical Provider, MD    Family History Family History  Problem Relation Age of Onset  . Heart disease Mother     cause of death  . Diabetes Sister   . Colon cancer Neg Hx     Social History Social History  Substance Use Topics  . Smoking status: Former Smoker    Packs/day: 2.00    Years: 25.00    Types: Pipe, Cigars    Start date: 11/08/1954    Quit date: 10/14/1987  . Smokeless tobacco: Never Used     Comment: About 2 pipes a day  . Alcohol use No     Allergies   Patient has no known allergies.   Review of Systems Review of Systems  Constitutional: Negative for unexpected weight change.  Cardiovascular: Negative for leg swelling.  Gastrointestinal: Negative for constipation and diarrhea.  Genitourinary: Positive for discharge, dysuria, scrotal swelling and testicular pain.  All other systems reviewed and are negative.    Physical Exam Updated Vital Signs BP (!) 149/79   Pulse 61   Temp 97.5 F (36.4 C) (Oral)   Resp 18   Ht 5\' 5"  (1.651 m)   Wt 174 lb (78.9 kg)   SpO2 100%   BMI 28.96 kg/m   Physical Exam  Constitutional: He is oriented to person, place, and time. He appears well-developed and well-nourished. No distress.  HENT:  Head: Normocephalic and atraumatic.  Eyes: Conjunctivae and EOM are normal.  Neck: Neck supple. No tracheal deviation present.  Cardiovascular: Normal rate and regular rhythm.   Pulmonary/Chest: Effort normal. No respiratory distress.  Abdominal: Soft. He exhibits distension. He exhibits no mass. There is no tenderness. There is no rebound and no guarding. A hernia is present.  Right diastasis erecti vs hernia  Genitourinary: Cremasteric reflex is present. Right testis shows swelling. Cremasteric reflex is not absent on the  right side. Cremasteric reflex is not absent on the left side. Penile tenderness present.  Genitourinary Comments: Some right testicular tenderness. Slightly swollen compared to the left. Penis is normal. Cremasteric present bilaterally.   Musculoskeletal: Normal range of motion.  Neurological: He is alert and oriented to person, place, and time.  Skin: Skin is warm and dry.  Psychiatric: He has a normal mood and affect. His behavior is normal.  Nursing note and vitals reviewed.    ED Treatments / Results  DIAGNOSTIC STUDIES: Oxygen Saturation is 98% on RA, normal by my interpretation.    COORDINATION OF CARE: 11:32 AM Discussed treatment plan with pt at bedside and pt agreed to plan.  Labs (all labs ordered are listed, but only abnormal results are displayed) Labs Reviewed  CBC WITH DIFFERENTIAL/PLATELET - Abnormal; Notable for the following:  Result Value   Hemoglobin 12.4 (*)    HCT 36.0 (*)    All other components within normal limits  COMPREHENSIVE METABOLIC PANEL - Abnormal; Notable for the following:    Glucose, Bld 122 (*)    BUN 27 (*)    Creatinine, Ser 2.04 (*)    GFR calc non Af Amer 29 (*)    GFR calc Af Amer 34 (*)    All other components within normal limits  URINALYSIS, ROUTINE W REFLEX MICROSCOPIC - Abnormal; Notable for the following:    Color, Urine STRAW (*)    Protein, ur 30 (*)    All other components within normal limits    EKG  EKG Interpretation None       Radiology US Scrotum  Result Date: 02/10/2017 CLINICAL DATA:  Intermittent scrotal pain. History of prostate carcinoma EXAM: SCROTAL ULTRASOUND DOPPLER ULTRASOUND OF THE TESTICLES TECHNIQUE: Complete ultrasound examination of the testicles, epididymis, and other scrotal structures was performed. Color and spectral Doppler ultrasound were also utilized to evaluate blood flow to the testicles. COMPARISON:  None. FINDINGS: Right testicle Measurements: 4.1 x 2.1 x 2.8 cm. No mass or  microlithiasis visualized. Left testicle Measurements: 3.8 x 2.3 x 2.4 cm. No microlithiasis visualized. There is a simple cyst in the upper pole of the left testis measuring 3 x 2 x 2 mm. Right epididymis: There is a cyst in the epididymal tail on the right measuring 5 x 5 x 5 mm. No inflammatory focus. Left epididymis:  Normal in size and appearance. Hydrocele: There is a small hydrocele on the right with a rather minimal hydrocele on the left. Varicocele:  None visualized. Pulsed Doppler interrogation of both testes demonstrates normal low resistance arterial and venous waveforms bilaterally. No scrotal wall thickening or scrotal abscess. IMPRESSION: Tiny cyst in the left testis. No noncystic testicular mass on either side. Small epididymal tail cyst on the right. No evidence of noncystic extratesticular mass. No evidence of inflammation or torsion on either side. Small hydrocele on the right with minimal hydrocele on the left. Electronically Signed   By: Lowella Grip III M.D.   On: 02/10/2017 12:27   [place] Art/ven Flow Abd Pelv Doppler  Result Date: 02/10/2017 CLINICAL DATA:  Intermittent scrotal pain. History of prostate carcinoma EXAM: SCROTAL ULTRASOUND DOPPLER ULTRASOUND OF THE TESTICLES TECHNIQUE: Complete ultrasound examination of the testicles, epididymis, and other scrotal structures was performed. Color and spectral Doppler ultrasound were also utilized to evaluate blood flow to the testicles. COMPARISON:  None. FINDINGS: Right testicle Measurements: 4.1 x 2.1 x 2.8 cm. No mass or microlithiasis visualized. Left testicle Measurements: 3.8 x 2.3 x 2.4 cm. No microlithiasis visualized. There is a simple cyst in the upper pole of the left testis measuring 3 x 2 x 2 mm. Right epididymis: There is a cyst in the epididymal tail on the right measuring 5 x 5 x 5 mm. No inflammatory focus. Left epididymis:  Normal in size and appearance. Hydrocele: There is a small hydrocele on the right with a rather  minimal hydrocele on the left. Varicocele:  None visualized. Pulsed Doppler interrogation of both testes demonstrates normal low resistance arterial and venous waveforms bilaterally. No scrotal wall thickening or scrotal abscess. IMPRESSION: Tiny cyst in the left testis. No noncystic testicular mass on either side. Small epididymal tail cyst on the right. No evidence of noncystic extratesticular mass. No evidence of inflammation or torsion on either side. Small hydrocele on the right with minimal hydrocele on the  left. Electronically Signed   By: Lowella Grip III M.D.   On: 02/10/2017 12:27   Ct Renal Stone Study  Result Date: 02/10/2017 CLINICAL DATA:  Lower abdominal pain and bilateral groin pain 2 weeks. History prostate cancer post radiation treatment 2015. EXAM: CT ABDOMEN AND PELVIS WITHOUT CONTRAST TECHNIQUE: Multidetector CT imaging of the abdomen and pelvis was performed following the standard protocol without IV contrast. COMPARISON:  10/24/2016 and 10/09/2014 FINDINGS: Lower chest: Minimal chronic interstitial change over the posteromedial left base. No consolidation or effusion. Hepatobiliary: Within normal. Pancreas: Within normal. Spleen: Within normal. Adrenals/Urinary Tract: Adrenal glands are normal. Kidneys are normal in size without hydronephrosis or nephrolithiasis. There are several bilateral renal cysts out significant change. Ureters and bladder are normal. Stomach/Bowel: Stomach and small bowel are normal. Appendix is normal. Minimal diverticulosis of the colon. Vascular/Lymphatic: Mild calcified plaque over the abdominal aorta and iliac arteries. No evidence of adenopathy. Reproductive: Fiducial markers over the prostate compatible with previous radiation treatment. Other: No free fluid or focal inflammatory change. Musculoskeletal: Mild degenerate change of the spine and hips. IMPRESSION: No acute findings in the abdomen/pelvis. No nephroureterolithiasis or obstruction. Bilateral  renal cysts. Mild colonic diverticulosis. Aortic atherosclerosis. Electronically Signed   By: Marin Olp M.D.   On: 02/10/2017 15:08    Procedures Procedures (including critical care time)  Medications Ordered in ED Medications - No data to display   Initial Impression / Assessment and Plan / ED Course  I have reviewed the triage vital signs and the nursing notes.  Pertinent labs & imaging results that were available during my care of the patient were reviewed by me and considered in my medical decision making (see chart for details).     Unclear cause for symptoms but no e/o metastatic cancer, testicular torsion, kidney stone or other acute causes for symptoms.   Final Clinical Impressions(s) / ED Diagnoses   Final diagnoses:  Testicle pain    New Prescriptions Discharge Medication List as of 02/10/2017  3:14 PM     I personally performed the services described in this documentation, which was scribed in my presence. The recorded information has been reviewed and is accurate.     Merrily Pew, MD 02/11/17 2017

## 2017-02-10 NOTE — ED Notes (Signed)
Patient transported to CT 

## 2017-02-12 DIAGNOSIS — C61 Malignant neoplasm of prostate: Secondary | ICD-10-CM | POA: Diagnosis not present

## 2017-02-12 DIAGNOSIS — N410 Acute prostatitis: Secondary | ICD-10-CM | POA: Diagnosis not present

## 2017-03-07 ENCOUNTER — Other Ambulatory Visit: Payer: Self-pay | Admitting: Cardiovascular Disease

## 2017-04-01 ENCOUNTER — Ambulatory Visit: Payer: Medicare HMO | Admitting: Nurse Practitioner

## 2017-04-01 DIAGNOSIS — I10 Essential (primary) hypertension: Secondary | ICD-10-CM | POA: Diagnosis not present

## 2017-04-01 DIAGNOSIS — N184 Chronic kidney disease, stage 4 (severe): Secondary | ICD-10-CM | POA: Diagnosis not present

## 2017-04-01 DIAGNOSIS — N419 Inflammatory disease of prostate, unspecified: Secondary | ICD-10-CM | POA: Diagnosis not present

## 2017-04-01 DIAGNOSIS — E1142 Type 2 diabetes mellitus with diabetic polyneuropathy: Secondary | ICD-10-CM | POA: Diagnosis not present

## 2017-04-06 ENCOUNTER — Ambulatory Visit (INDEPENDENT_AMBULATORY_CARE_PROVIDER_SITE_OTHER): Payer: Medicare HMO | Admitting: Otolaryngology

## 2017-04-13 ENCOUNTER — Emergency Department (HOSPITAL_COMMUNITY)
Admission: EM | Admit: 2017-04-13 | Discharge: 2017-04-13 | Disposition: A | Discharge status: Home / Self Care | Payer: Medicare HMO | Attending: Emergency Medicine | Admitting: Emergency Medicine

## 2017-04-13 ENCOUNTER — Encounter (HOSPITAL_COMMUNITY): Payer: Self-pay | Admitting: Emergency Medicine

## 2017-04-13 DIAGNOSIS — Z87891 Personal history of nicotine dependence: Secondary | ICD-10-CM | POA: Diagnosis not present

## 2017-04-13 DIAGNOSIS — R32 Unspecified urinary incontinence: Secondary | ICD-10-CM | POA: Diagnosis not present

## 2017-04-13 DIAGNOSIS — Z7982 Long term (current) use of aspirin: Secondary | ICD-10-CM | POA: Diagnosis not present

## 2017-04-13 DIAGNOSIS — Z7984 Long term (current) use of oral hypoglycemic drugs: Secondary | ICD-10-CM | POA: Insufficient documentation

## 2017-04-13 DIAGNOSIS — Z79899 Other long term (current) drug therapy: Secondary | ICD-10-CM | POA: Insufficient documentation

## 2017-04-13 DIAGNOSIS — E119 Type 2 diabetes mellitus without complications: Secondary | ICD-10-CM | POA: Insufficient documentation

## 2017-04-13 DIAGNOSIS — Z8546 Personal history of malignant neoplasm of prostate: Secondary | ICD-10-CM | POA: Insufficient documentation

## 2017-04-13 DIAGNOSIS — I10 Essential (primary) hypertension: Secondary | ICD-10-CM | POA: Insufficient documentation

## 2017-04-13 LAB — URINALYSIS, ROUTINE W REFLEX MICROSCOPIC
Bacteria, UA: NONE SEEN
Bilirubin Urine: NEGATIVE
Glucose, UA: NEGATIVE mg/dL
Hgb urine dipstick: NEGATIVE
Ketones, ur: NEGATIVE mg/dL
Leukocytes, UA: NEGATIVE
Nitrite: NEGATIVE
Protein, ur: 100 mg/dL — AB
Specific Gravity, Urine: 1.013 (ref 1.005–1.030)
Squamous Epithelial / LPF: NONE SEEN
pH: 5 (ref 5.0–8.0)

## 2017-04-13 LAB — BASIC METABOLIC PANEL
Anion gap: 8 (ref 5–15)
BUN: 29 mg/dL — ABNORMAL HIGH (ref 6–20)
CO2: 22 mmol/L (ref 22–32)
Calcium: 9.7 mg/dL (ref 8.9–10.3)
Chloride: 108 mmol/L (ref 101–111)
Creatinine, Ser: 2.26 mg/dL — ABNORMAL HIGH (ref 0.61–1.24)
GFR calc Af Amer: 30 mL/min — ABNORMAL LOW (ref >60)
GFR calc non Af Amer: 26 mL/min — ABNORMAL LOW (ref >60)
Glucose, Bld: 154 mg/dL — ABNORMAL HIGH (ref 65–99)
Potassium: 4.6 mmol/L (ref 3.5–5.1)
Sodium: 138 mmol/L (ref 135–145)

## 2017-04-13 LAB — CBC WITH DIFFERENTIAL/PLATELET
Basophils Absolute: 0 10*3/uL (ref 0.0–0.1)
Basophils Relative: 0 %
Eosinophils Absolute: 0.1 10*3/uL (ref 0.0–0.7)
Eosinophils Relative: 2 %
HCT: 37.1 % — ABNORMAL LOW (ref 39.0–52.0)
Hemoglobin: 12.7 g/dL — ABNORMAL LOW (ref 13.0–17.0)
Lymphocytes Relative: 25 %
Lymphs Abs: 1.2 10*3/uL (ref 0.7–4.0)
MCH: 26.9 pg (ref 26.0–34.0)
MCHC: 34.2 g/dL (ref 30.0–36.0)
MCV: 78.6 fL (ref 78.0–100.0)
Monocytes Absolute: 0.6 10*3/uL (ref 0.1–1.0)
Monocytes Relative: 12 %
Neutro Abs: 2.9 10*3/uL (ref 1.7–7.7)
Neutrophils Relative %: 61 %
Platelets: 197 10*3/uL (ref 150–400)
RBC: 4.72 MIL/uL (ref 4.22–5.81)
RDW: 13.2 % (ref 11.5–15.5)
WBC: 4.8 10*3/uL (ref 4.0–10.5)

## 2017-04-13 NOTE — Discharge Instructions (Signed)
Workup here today without any acute findings. No evidence of urinary tract infection. If symptoms get worse return. Due to urinary incontinence and discomfort recommend follow-up with urology. Make an appointment.

## 2017-04-13 NOTE — ED Provider Notes (Signed)
[facility] DEPT Provider Note   CSN: [id number] Arrival date & time: 04/13/17  1418     History   Chief Complaint Chief Complaint  Patient presents with  . Groin Pain    HPI Steve [name] is a 81 y.o. male.  The patient presenting with complaints of some urinary incontinence leaking and dribbling. But urinating with good stream. Not concerned about retention. Patient also states her some burning to the area. Denies any discharge. Patient has had several visits recently for related concerns was seen May 1 for testicular pain seen April 23 for groin pain. Patient denies fevers. Denies blood in the urine. Has had a history of prostate cancer.       Past Medical History:  Diagnosis Date  . Arthritis   . Cancer [facility])    prostate  . Diabetes mellitus without complication ([place])   . Hypertension     Patient Active Problem List   Diagnosis Date Noted  . IBS (irritable bowel syndrome) 01/28/2017  . Prostate cancer ([place]) 07/10/2014  . Abnormal ECG 04/12/2013  . HTN (hypertension) 04/12/2013  . SOB (shortness of breath) 04/12/2013  . CHEST PAIN-UNSPECIFIED 06/12/2009    Past Surgical History:  Procedure Laterality Date  . CATARACT EXTRACTION W/PHACO Right 04/28/2016   Procedure: CATARACT EXTRACTION PHACO AND INTRAOCULAR LENS PLACEMENT (IOC);  Surgeon: Williams Che, MD;  Location: [facility] OR[facility];  Service: Ophthalmology;  Laterality: Right;  CDE: 4.28  . COLONOSCOPY     about 2 years in [place]   . CYSTECTOMY         Home Medications    Prior to Admission medications   Medication Sig Start Date End Date Taking? Authorizing Provider  acetaminophen (TYLENOL) 650 MG CR tablet Take 650 mg by mouth every 8 (eight) hours as needed for pain.    [provider]  albuterol (PROVENTIL HFA;VENTOLIN HFA) 108 (90 Base) MCG/ACT inhaler Inhale 1-2 puffs into the lungs every 6 (six) hours as needed for wheezing or shortness of breath.    [provider]  aspirin 81 MG  tablet Take 81 mg by mouth daily.    [provider]  Cholecalciferol (VITAMIN D-3) 1000 UNITS CAPS Take 1 capsule by mouth daily.     [provider]  docusate sodium (COLACE) 100 MG capsule Take 100 mg by mouth 2 (two) times daily.    [provider]  furosemide (LASIX) 20 MG tablet Take 20 mg by mouth daily as needed for fluid.    [provider]  gabapentin (NEURONTIN) 300 MG capsule Take 1 capsule by mouth 2 (two) times daily. 01/19/15   [provider]  iron polysaccharides (NIFEREX) 150 MG capsule Take 150 mg by mouth daily.    [provider]  linagliptin (TRADJENTA) 5 MG TABS tablet Take 5 mg by mouth daily.    [provider]  losartan (COZAAR) 100 MG tablet Take 1 tablet by mouth daily. 04/06/13   [provider]  Lysine 500 MG CAPS Take 500 mg by mouth daily.    [provider]  NIFEdipine (PROCARDIA XL/ADALAT-CC) 90 MG 24 hr tablet TAKE 1 TABLET ONCE DAILY. 03/10/17   Herminio Commons, MD  nitroGLYCERIN (NITROSTAT) 0.4 MG SL tablet Place 0.4 mg under the tongue every 5 (five) minutes as needed for chest pain.    [provider]  omeprazole (PRILOSEC) 20 MG capsule Take 1 capsule by mouth 2 (two) times daily. 04/06/13   [provider]  pravastatin (PRAVACHOL) 40  MG tablet Take 1 tablet by mouth daily. 04/06/13   [provider]  tamsulosin (FLOMAX) 0.4 MG CAPS Take 1 capsule by mouth 2 (two) times daily.  04/06/13   [provider]    Family History Family History  Problem Relation Age of Onset  . Heart disease Mother        cause of death  . Diabetes Sister   . Colon cancer Neg Hx     Social History Social History  Substance Use Topics  . Smoking status: Former Smoker    Packs/day: 2.00    Years: 25.00    Types: Pipe, Cigars    Start date: 11/08/1954    Quit date: 10/14/1987  . Smokeless tobacco: Never Used     Comment: About 2 pipes a day  . Alcohol use No       Allergies   Patient has no known allergies.   Review of Systems Review of Systems  Constitutional: Negative for fever.  HENT: Negative for congestion.   Eyes: Negative for visual disturbance.  Cardiovascular: Negative for chest pain.  Gastrointestinal: Negative for abdominal pain.  Genitourinary: Positive for dysuria. Negative for difficulty urinating, discharge, hematuria and testicular pain.  Musculoskeletal: Negative for back pain.  Neurological: Negative for headaches.  Hematological: Does not bruise/bleed easily.  Psychiatric/Behavioral: Negative for confusion.     Physical Exam Updated Vital Signs BP (!) 142/74   Pulse 72   Temp 98.1 F (36.7 C) (Oral)   Resp 18   Ht 1.651 m (5\' 5" )   Wt 80.7 kg (178 lb)   SpO2 99%   BMI 29.62 kg/m   Physical Exam  Constitutional: He is oriented to person, place, and time. He appears well-developed and well-nourished. No distress.  HENT:  Head: Normocephalic and atraumatic.  Mouth/Throat: Oropharynx is clear and moist.  Eyes: EOM are normal. Pupils are equal, round, and reactive to light.  Neck: Normal range of motion. Neck supple.  Cardiovascular: Normal rate.   Pulmonary/Chest: Effort normal and breath sounds normal.  Abdominal: Soft. Bowel sounds are normal. There is no tenderness.  Genitourinary: Penis normal.  Genitourinary Comments: No penile discharge. No testicular pain no swelling. No lesions no rash. No groin masses. No groin rash. No adenopathy.  Musculoskeletal: Normal range of motion.  Neurological: He is alert and oriented to person, place, and time. He exhibits normal muscle tone. Coordination normal.  Skin: Skin is warm.  Nursing note and vitals reviewed.    ED Treatments / Results  Labs (all labs ordered are listed, but only abnormal results are displayed) Labs Reviewed  URINALYSIS, ROUTINE W REFLEX MICROSCOPIC - Abnormal; Notable for the following:       Result Value   Protein, ur 100 (*)     All other components within normal limits  BASIC METABOLIC PANEL - Abnormal; Notable for the following:    Glucose, Bld 154 (*)    BUN 29 (*)    Creatinine, Ser 2.26 (*)    GFR calc non Af Amer 26 (*)    GFR calc Af Amer 30 (*)    All other components within normal limits  CBC WITH DIFFERENTIAL/PLATELET - Abnormal; Notable for the following:    Hemoglobin 12.7 (*)    HCT 37.1 (*)    All other components within normal limits    EKG  EKG Interpretation None       Radiology No results found.  Procedures Procedures (including critical care time)  Medications Ordered in ED  Medications - No data to display   Initial Impression / Assessment and Plan / ED Course  I have reviewed the triage vital signs and the nursing notes.  Pertinent labs & imaging results that were available during my care of the patient were reviewed by me and considered in my medical decision making (see chart for details).    I workup without any obvious findings. Patient has had a history of prostate cancer. Some of the discomfort is deep. Could be suggestive of prostate infection urine very normal. Labs here very normal. We'll have him follow-up with urology.   Final Clinical Impressions(s) / ED Diagnoses   Final diagnoses:  Urinary incontinence, unspecified type    New Prescriptions New Prescriptions   No medications on file     Fredia Sorrow, MD 04/13/17 1942

## 2017-04-13 NOTE — ED Notes (Signed)
Pt states he has noticed some clear penile d/c for approx 4-5 days. Denies new sexual partners or pain. States pain radiates from R lower back into groin. Denies dyuria, hematuria, hx of kidney stones, or swelling of testicles or penis.

## 2017-04-13 NOTE — ED Triage Notes (Signed)
Patient complaining of "burning" to groin area x 1 week. Denies dysuria.

## 2017-04-21 ENCOUNTER — Ambulatory Visit (INDEPENDENT_AMBULATORY_CARE_PROVIDER_SITE_OTHER): Payer: Medicare HMO | Admitting: Urology

## 2017-04-21 DIAGNOSIS — R102 Pelvic and perineal pain: Secondary | ICD-10-CM | POA: Diagnosis not present

## 2017-05-07 DIAGNOSIS — E1142 Type 2 diabetes mellitus with diabetic polyneuropathy: Secondary | ICD-10-CM | POA: Diagnosis not present

## 2017-05-07 DIAGNOSIS — N342 Other urethritis: Secondary | ICD-10-CM | POA: Diagnosis not present

## 2017-05-07 DIAGNOSIS — Z1389 Encounter for screening for other disorder: Secondary | ICD-10-CM | POA: Diagnosis not present

## 2017-05-07 DIAGNOSIS — I10 Essential (primary) hypertension: Secondary | ICD-10-CM | POA: Diagnosis not present

## 2017-05-07 DIAGNOSIS — E119 Type 2 diabetes mellitus without complications: Secondary | ICD-10-CM | POA: Diagnosis not present

## 2017-05-07 DIAGNOSIS — Z0000 Encounter for general adult medical examination without abnormal findings: Secondary | ICD-10-CM | POA: Diagnosis not present

## 2017-05-07 DIAGNOSIS — M109 Gout, unspecified: Secondary | ICD-10-CM | POA: Diagnosis not present

## 2017-05-07 DIAGNOSIS — K219 Gastro-esophageal reflux disease without esophagitis: Secondary | ICD-10-CM | POA: Diagnosis not present

## 2017-05-07 DIAGNOSIS — N184 Chronic kidney disease, stage 4 (severe): Secondary | ICD-10-CM | POA: Diagnosis not present

## 2017-05-18 DIAGNOSIS — J209 Acute bronchitis, unspecified: Secondary | ICD-10-CM | POA: Diagnosis not present

## 2017-05-18 DIAGNOSIS — E1142 Type 2 diabetes mellitus with diabetic polyneuropathy: Secondary | ICD-10-CM | POA: Diagnosis not present

## 2017-06-24 DIAGNOSIS — E559 Vitamin D deficiency, unspecified: Secondary | ICD-10-CM | POA: Diagnosis not present

## 2017-06-24 DIAGNOSIS — D509 Iron deficiency anemia, unspecified: Secondary | ICD-10-CM | POA: Diagnosis not present

## 2017-06-24 DIAGNOSIS — R809 Proteinuria, unspecified: Secondary | ICD-10-CM | POA: Diagnosis not present

## 2017-06-24 DIAGNOSIS — Z79899 Other long term (current) drug therapy: Secondary | ICD-10-CM | POA: Diagnosis not present

## 2017-06-24 DIAGNOSIS — I129 Hypertensive chronic kidney disease with stage 1 through stage 4 chronic kidney disease, or unspecified chronic kidney disease: Secondary | ICD-10-CM | POA: Diagnosis not present

## 2017-06-24 DIAGNOSIS — N183 Chronic kidney disease, stage 3 (moderate): Secondary | ICD-10-CM | POA: Diagnosis not present

## 2017-06-30 DIAGNOSIS — I509 Heart failure, unspecified: Secondary | ICD-10-CM | POA: Diagnosis not present

## 2017-06-30 DIAGNOSIS — N183 Chronic kidney disease, stage 3 (moderate): Secondary | ICD-10-CM | POA: Diagnosis not present

## 2017-06-30 DIAGNOSIS — E1129 Type 2 diabetes mellitus with other diabetic kidney complication: Secondary | ICD-10-CM | POA: Diagnosis not present

## 2017-06-30 DIAGNOSIS — I10 Essential (primary) hypertension: Secondary | ICD-10-CM | POA: Diagnosis not present

## 2017-06-30 DIAGNOSIS — R809 Proteinuria, unspecified: Secondary | ICD-10-CM | POA: Diagnosis not present

## 2017-07-01 ENCOUNTER — Encounter (HOSPITAL_COMMUNITY): Payer: Self-pay | Admitting: *Deleted

## 2017-07-01 ENCOUNTER — Observation Stay (HOSPITAL_COMMUNITY)
Admission: EM | Admit: 2017-07-01 | Discharge: 2017-07-03 | Disposition: A | Discharge status: Home / Self Care | Payer: Medicare HMO | Attending: Internal Medicine | Admitting: Internal Medicine

## 2017-07-01 ENCOUNTER — Emergency Department (HOSPITAL_COMMUNITY): Payer: Medicare HMO

## 2017-07-01 DIAGNOSIS — Z7982 Long term (current) use of aspirin: Secondary | ICD-10-CM | POA: Insufficient documentation

## 2017-07-01 DIAGNOSIS — I10 Essential (primary) hypertension: Secondary | ICD-10-CM | POA: Diagnosis present

## 2017-07-01 DIAGNOSIS — R140 Abdominal distension (gaseous): Secondary | ICD-10-CM

## 2017-07-01 DIAGNOSIS — Z8546 Personal history of malignant neoplasm of prostate: Secondary | ICD-10-CM | POA: Diagnosis not present

## 2017-07-01 DIAGNOSIS — Z7984 Long term (current) use of oral hypoglycemic drugs: Secondary | ICD-10-CM | POA: Insufficient documentation

## 2017-07-01 DIAGNOSIS — E119 Type 2 diabetes mellitus without complications: Secondary | ICD-10-CM | POA: Insufficient documentation

## 2017-07-01 DIAGNOSIS — R109 Unspecified abdominal pain: Secondary | ICD-10-CM | POA: Diagnosis not present

## 2017-07-01 DIAGNOSIS — R778 Other specified abnormalities of plasma proteins: Secondary | ICD-10-CM | POA: Diagnosis present

## 2017-07-01 DIAGNOSIS — R1013 Epigastric pain: Secondary | ICD-10-CM

## 2017-07-01 DIAGNOSIS — Z7902 Long term (current) use of antithrombotics/antiplatelets: Secondary | ICD-10-CM | POA: Diagnosis not present

## 2017-07-01 DIAGNOSIS — E1129 Type 2 diabetes mellitus with other diabetic kidney complication: Secondary | ICD-10-CM

## 2017-07-01 DIAGNOSIS — R7989 Other specified abnormal findings of blood chemistry: Principal | ICD-10-CM

## 2017-07-01 DIAGNOSIS — Z79899 Other long term (current) drug therapy: Secondary | ICD-10-CM | POA: Diagnosis not present

## 2017-07-01 DIAGNOSIS — Z87891 Personal history of nicotine dependence: Secondary | ICD-10-CM | POA: Insufficient documentation

## 2017-07-01 DIAGNOSIS — K449 Diaphragmatic hernia without obstruction or gangrene: Secondary | ICD-10-CM

## 2017-07-01 DIAGNOSIS — E1169 Type 2 diabetes mellitus with other specified complication: Secondary | ICD-10-CM

## 2017-07-01 HISTORY — DX: Essential (primary) hypertension: I10

## 2017-07-01 HISTORY — DX: Type 2 diabetes mellitus without complications: E11.9

## 2017-07-01 HISTORY — DX: Malignant neoplasm of prostate: C61

## 2017-07-01 LAB — URINALYSIS, ROUTINE W REFLEX MICROSCOPIC
Bacteria, UA: NONE SEEN
Bilirubin Urine: NEGATIVE
Glucose, UA: NEGATIVE mg/dL
Hgb urine dipstick: NEGATIVE
Ketones, ur: NEGATIVE mg/dL
Leukocytes, UA: NEGATIVE
Nitrite: NEGATIVE
Protein, ur: >=300 mg/dL — AB
Specific Gravity, Urine: 1.008 (ref 1.005–1.030)
pH: 7 (ref 5.0–8.0)

## 2017-07-01 MED ORDER — FENTANYL CITRATE (PF) 100 MCG/2ML IJ SOLN
50.0000 ug | Freq: Once | INTRAMUSCULAR | Status: AC
  Administered 2017-07-01: 50 ug via INTRAVENOUS
  Filled 2017-07-01: qty 2

## 2017-07-01 MED ORDER — SODIUM CHLORIDE 0.9 % IV BOLUS (SEPSIS)
500.0000 mL | Freq: Once | INTRAVENOUS | Status: AC
  Administered 2017-07-01: 500 mL via INTRAVENOUS

## 2017-07-01 MED ORDER — ONDANSETRON HCL 4 MG/2ML IJ SOLN
4.0000 mg | Freq: Once | INTRAMUSCULAR | Status: AC
  Administered 2017-07-01: 4 mg via INTRAVENOUS
  Filled 2017-07-01: qty 2

## 2017-07-01 NOTE — ED Triage Notes (Signed)
Abdominal pain with distention

## 2017-07-01 NOTE — ED Provider Notes (Signed)
[facility] DEPT Provider Note   CSN: [id number] Arrival date & time: 07/01/17  2208     History   Chief Complaint Chief Complaint  Patient presents with  . Abdominal Pain    HPI Steve [name] is a 81 y.o. male.  Patient presents with diffuse abdominal pain and distention onset around 3 PM after eating some cake. States he felt relatively well prior to this. He's had 2 episodes of nausea and vomiting. He had a normal bowel movement today without diarrhea. Still passing flatus. No previous abdominal surgeries. He has never had this pain before. Denies any change in urine output. No fever. No dysuria hematuria. Patient with history of prostate cancer in remission. Denies any sick contacts, antibiotic use or recent travel.   The history is provided by the patient.  Abdominal Pain   Associated symptoms include nausea and vomiting. Pertinent negatives include fever, diarrhea, constipation, dysuria, hematuria, headaches, arthralgias and myalgias.    Past Medical History:  Diagnosis Date  . Arthritis   . Cancer [facility])    prostate  . Diabetes mellitus without complication ([place])   . Hypertension     Patient Active Problem List   Diagnosis Date Noted  . IBS (irritable bowel syndrome) 01/28/2017  . Prostate cancer ([place]) 07/10/2014  . Abnormal ECG 04/12/2013  . HTN (hypertension) 04/12/2013  . SOB (shortness of breath) 04/12/2013  . CHEST PAIN-UNSPECIFIED 06/12/2009    Past Surgical History:  Procedure Laterality Date  . CATARACT EXTRACTION W/PHACO Right 04/28/2016   Procedure: CATARACT EXTRACTION PHACO AND INTRAOCULAR LENS PLACEMENT (IOC);  Surgeon: Williams Che, MD;  Location: [facility];  Service: Ophthalmology;  Laterality: Right;  CDE: 4.28  . COLONOSCOPY     about 2 years in [place]   . CYSTECTOMY         Home Medications    Prior to Admission medications   Medication Sig Start Date End Date Taking? Authorizing Provider  acetaminophen (TYLENOL) 650 MG CR tablet  Take 650 mg by mouth every 8 (eight) hours as needed for pain.   Yes [provider]  albuterol (PROVENTIL HFA;VENTOLIN HFA) 108 (90 Base) MCG/ACT inhaler Inhale 1-2 puffs into the lungs every 6 (six) hours as needed for wheezing or shortness of breath.   Yes [provider]  aspirin 81 MG tablet Take 81 mg by mouth daily.   Yes [provider]  Cholecalciferol (VITAMIN D-3) 1000 UNITS CAPS Take 1 capsule by mouth once a week.    Yes [provider]  diazepam (VALIUM) 5 MG tablet Take 5 mg by mouth 2 (two) times daily as needed (for pelvic pain).   Yes [provider]  docusate sodium (COLACE) 100 MG capsule Take 100 mg by mouth daily.    Yes [provider]  furosemide (LASIX) 20 MG tablet Take 20 mg by mouth daily as needed for fluid.   Yes [provider]  gabapentin (NEURONTIN) 300 MG capsule Take 1 capsule by mouth 2 (two) times daily. 01/19/15  Yes [provider]  linagliptin (TRADJENTA) 5 MG TABS tablet Take 5 mg by mouth daily.   Yes [provider]  losartan (COZAAR) 100 MG tablet Take 1 tablet by mouth daily. 04/06/13  Yes [provider]  Lysine 500 MG CAPS Take 500 mg by mouth daily.   Yes [provider]  NIFEdipine (PROCARDIA XL/ADALAT-CC) 90 MG 24 hr tablet TAKE 1 TABLET ONCE DAILY. 03/10/17  Yes Herminio Commons, MD  omeprazole ([place]) 20  MG capsule Take 1 capsule by mouth 2 (two) times daily. 04/06/13  Yes [provider]  pravastatin (PRAVACHOL) 40 MG tablet Take 1 tablet by mouth every evening.  04/06/13  Yes [provider]  tamsulosin (FLOMAX) 0.4 MG CAPS Take 1 capsule by mouth 2 (two) times daily.  04/06/13  Yes [provider]  nitroGLYCERIN (NITROSTAT) 0.4 MG SL tablet Place 0.4 mg under the tongue every 5 (five) minutes as needed for chest pain.    [provider]    Family History Family History  Problem Relation Age of Onset  . Heart  disease Mother        cause of death  . Diabetes Sister   . Colon cancer Neg Hx     Social History Social History  Substance Use Topics  . Smoking status: Former Smoker    Packs/day: 2.00    Years: 25.00    Types: Pipe, Cigars    Start date: 11/08/1954    Quit date: 10/14/1987  . Smokeless tobacco: Never Used     Comment: About 2 pipes a day  . Alcohol use No     Allergies   Patient has no known allergies.   Review of Systems Review of Systems  Constitutional: Positive for activity change and appetite change. Negative for fever.  HENT: Negative for congestion and rhinorrhea.   Respiratory: Negative for cough, chest tightness and shortness of breath.   Cardiovascular: Negative for chest pain.  Gastrointestinal: Positive for abdominal pain, nausea and vomiting. Negative for constipation and diarrhea.  Genitourinary: Negative for difficulty urinating, dysuria, hematuria, testicular pain and urgency.  Musculoskeletal: Negative for arthralgias, joint swelling and myalgias.  Skin: Negative for rash.  Neurological: Negative for dizziness, weakness, light-headedness and headaches.    all other systems are negative except as noted in the HPI and PMH.    Physical Exam Updated Vital Signs BP (!) 174/128   Pulse 78   Temp 97.7 F (36.5 C) (Oral)   Resp (!) 22   SpO2 96%   Physical Exam  Constitutional: He is oriented to person, place, and time. He appears well-developed and well-nourished. No distress.  uncomfortable  HENT:  Head: Normocephalic and atraumatic.  Mouth/Throat: Oropharynx is clear and moist. No oropharyngeal exudate.  Eyes: Pupils are equal, round, and reactive to light. Conjunctivae and EOM are normal.  Neck: Normal range of motion. Neck supple.  No meningismus.  Cardiovascular: Normal rate, regular rhythm, normal heart sounds and intact distal pulses.   No murmur heard. Pulmonary/Chest: Effort normal and breath sounds normal. No respiratory distress.    Abdominal: Soft. He exhibits distension. There is tenderness. There is no rebound and no guarding.  Distended abdomen. Diffusely tender. No guarding or rebound  Genitourinary:  Genitourinary Comments: No testicular tenderness  Musculoskeletal: Normal range of motion. He exhibits no edema or tenderness.  Neurological: He is alert and oriented to person, place, and time. No cranial nerve deficit. He exhibits normal muscle tone. Coordination normal.   5/5 strength throughout. CN 2-12 intact.Equal grip strength.   Skin: Skin is warm.  Psychiatric: He has a normal mood and affect. His behavior is normal.  Nursing note and vitals reviewed.    ED Treatments / Results  Labs (all labs ordered are listed, but only abnormal results are displayed) Labs Reviewed  URINALYSIS, ROUTINE W REFLEX MICROSCOPIC - Abnormal; Notable for the following:       Result Value   Color, Urine STRAW (*)    Protein,  ur >=300 (*)    Squamous Epithelial / LPF 0-5 (*)    All other components within normal limits  CBC WITH DIFFERENTIAL/PLATELET - Abnormal; Notable for the following:    WBC 11.4 (*)    Hemoglobin 12.7 (*)    HCT 37.1 (*)    Neutro Abs 10.2 (*)    Lymphs Abs 0.6 (*)    All other components within normal limits  COMPREHENSIVE METABOLIC PANEL - Abnormal; Notable for the following:    CO2 20 (*)    Glucose, Bld 171 (*)    BUN 24 (*)    Creatinine, Ser 2.32 (*)    Total Protein 8.2 (*)    AST 110 (*)    GFR calc non Af Amer 25 (*)    GFR calc Af Amer 29 (*)    All other components within normal limits  LIPASE, BLOOD - Abnormal; Notable for the following:    Lipase 58 (*)    All other components within normal limits  TROPONIN I - Abnormal; Notable for the following:    Troponin I 0.03 (*)    All other components within normal limits  TROPONIN I - Abnormal; Notable for the following:    Troponin I 0.04 (*)    All other components within normal limits  TROPONIN I - Abnormal; Notable for the  following:    Troponin I 0.07 (*)    All other components within normal limits  I-STAT CHEM 8, ED - Abnormal; Notable for the following:    BUN 25 (*)    Creatinine, Ser 2.50 (*)    Glucose, Bld 178 (*)    All other components within normal limits  TROPONIN I  I-STAT CG4 LACTIC ACID, ED    EKG  EKG Interpretation  Date/Time:  Wednesday July 01 2017 22:12:54 EDT Ventricular Rate:  79 PR Interval:    QRS Duration: 98 QT Interval:  357 QTC Calculation: 410 R Axis:   -34 Text Interpretation:  Sinus rhythm Probable left atrial enlargement Left axis deviation Borderline T abnormalities, diffuse leads Confirmed by Merrily Pew [id number]) on 07/01/2017 10:18:50 PM       Radiology Ct Abdomen Pelvis Wo Contrast  Result Date: 07/02/2017 CLINICAL DATA:  Abdomen distension history of prostate cancer EXAM: CT ABDOMEN AND PELVIS WITHOUT CONTRAST TECHNIQUE: Multidetector CT imaging of the abdomen and pelvis was performed following the standard protocol without IV contrast. COMPARISON:  Radiograph 07/01/2017, CT 02/10/2017 FINDINGS: Lower chest: Lung bases demonstrate no acute consolidation or pleural effusion. Borderline to mild cardiomegaly. Trace pericardial effusion. Hepatobiliary: No focal liver abnormality is seen. No gallstones, gallbladder wall thickening, or biliary dilatation. Pancreas: Unremarkable. No pancreatic ductal dilatation or surrounding inflammatory changes. Spleen: Normal in size without focal abnormality. Adrenals/Urinary Tract: Adrenal glands are within normal limits. No hydronephrosis. Hypodense renal cortical lesions most likely cysts. Bladder unremarkable Stomach/Bowel: Stomach is within normal limits. Small distal esophageal hiatal hernia. Most of the contrast is in the stomach. Appendix appears normal. No evidence of bowel wall thickening, distention, or inflammatory changes. Vascular/Lymphatic: Aortic atherosclerosis. No enlarged abdominal or pelvic lymph nodes.  Reproductive: Metallic densities in the region of the prostate. Other: Negative for free air or free fluid. Musculoskeletal: Degenerative changes of the spine. No acute or suspicious bone lesion IMPRESSION: 1. Negative for a bowel obstruction. Moderate gastric enlargement. Small distal esophageal hiatal hernia. 2. Negative for hydronephrosis or ureteral stone Electronically Signed   By: Donavan Foil M.D.   On: 07/02/2017 03:05  Dg Abdomen Acute W/chest  Result Date: 07/01/2017 CLINICAL DATA:  Abdominal pain and vomiting. History of diabetes and hypertension. Former smoker. EXAM: DG ABDOMEN ACUTE W/ 1V CHEST COMPARISON:  Chest 01/15/2017.  Abdomen 11/14/2016 FINDINGS: Shallow inspiration. Mild linear atelectasis in the right lung base. Mild cardiac enlargement. No vascular congestion. No edema or consolidation. No blunting of costophrenic angles. No pneumothorax. Calcified and tortuous aorta. Degenerative changes in the spine. Scattered gas and stool throughout the colon. Scattered gas in the small bowel. No small or large bowel distention. No free intra-abdominal air. No abnormal air-fluid levels. No radiopaque stones. Visualized bones appear intact. Surgical clips in the low pelvis. IMPRESSION: Mild cardiac enlargement. Shallow inspiration with atelectasis in the right lung base. No evidence of active pulmonary disease. Nonobstructive bowel gas pattern. Electronically Signed   By: Lucienne Capers M.D.   On: 07/01/2017 23:42    Procedures Procedures (including critical care time)  Medications Ordered in ED Medications  sodium chloride 0.9 % bolus 500 mL (not administered)  ondansetron (ZOFRAN) injection 4 mg (not administered)  fentaNYL (SUBLIMAZE) injection 50 mcg (not administered)     Initial Impression / Assessment and Plan / ED Course  I have reviewed the triage vital signs and the nursing notes.  Pertinent labs & imaging results that were available during my care of the patient were  reviewed by me and considered in my medical decision making (see chart for details).     Sudden onset abdominal distension with nausea and vomiting.   Bladder scan 352. Abdominal series is negative for acute pathology or obstruction  Labs reassuring. Creatinine at baseline. Lactate normal. We'll obtain CT scan without contrast for further evaluation of abdominal pain  CT with hiatal hernia. No other acute findings. Patient feels improved, tolerating PO. Home BP meds given.  INitial EKG did have nonspecific T wave changes laterally. Second troponin is mildly elevated.  Patient does have CKD. Denies chest pain and states abdominal pain is improved.  Troponin now 0.07.  Continues to elevate, though minimal. ASA given.  Observation admission dw Dr Jerilee Hoh.   Final Clinical Impressions(s) / ED Diagnoses   Final diagnoses:  Elevated troponin  Epigastric pain  Hiatal hernia    New Prescriptions New Prescriptions   No medications on file     Ezequiel Essex, MD 07/02/17 0900

## 2017-07-02 ENCOUNTER — Emergency Department (HOSPITAL_COMMUNITY): Payer: Medicare HMO

## 2017-07-02 ENCOUNTER — Encounter (HOSPITAL_COMMUNITY): Payer: Self-pay

## 2017-07-02 DIAGNOSIS — R1013 Epigastric pain: Secondary | ICD-10-CM

## 2017-07-02 DIAGNOSIS — R7989 Other specified abnormal findings of blood chemistry: Secondary | ICD-10-CM | POA: Diagnosis present

## 2017-07-02 DIAGNOSIS — E1169 Type 2 diabetes mellitus with other specified complication: Secondary | ICD-10-CM

## 2017-07-02 DIAGNOSIS — R748 Abnormal levels of other serum enzymes: Secondary | ICD-10-CM | POA: Diagnosis not present

## 2017-07-02 DIAGNOSIS — E1129 Type 2 diabetes mellitus with other diabetic kidney complication: Secondary | ICD-10-CM

## 2017-07-02 DIAGNOSIS — R140 Abdominal distension (gaseous): Secondary | ICD-10-CM | POA: Diagnosis not present

## 2017-07-02 DIAGNOSIS — R778 Other specified abnormalities of plasma proteins: Secondary | ICD-10-CM | POA: Diagnosis present

## 2017-07-02 LAB — COMPREHENSIVE METABOLIC PANEL
ALT: 55 U/L (ref 17–63)
AST: 110 U/L — ABNORMAL HIGH (ref 15–41)
Albumin: 4.3 g/dL (ref 3.5–5.0)
Alkaline Phosphatase: 116 U/L (ref 38–126)
Anion gap: 11 (ref 5–15)
BUN: 24 mg/dL — ABNORMAL HIGH (ref 6–20)
CO2: 20 mmol/L — ABNORMAL LOW (ref 22–32)
Calcium: 9.3 mg/dL (ref 8.9–10.3)
Chloride: 104 mmol/L (ref 101–111)
Creatinine, Ser: 2.32 mg/dL — ABNORMAL HIGH (ref 0.61–1.24)
GFR calc Af Amer: 29 mL/min — ABNORMAL LOW (ref >60)
GFR calc non Af Amer: 25 mL/min — ABNORMAL LOW (ref >60)
Glucose, Bld: 171 mg/dL — ABNORMAL HIGH (ref 65–99)
Potassium: 4.5 mmol/L (ref 3.5–5.1)
Sodium: 135 mmol/L (ref 135–145)
Total Bilirubin: 0.6 mg/dL (ref 0.3–1.2)
Total Protein: 8.2 g/dL — ABNORMAL HIGH (ref 6.5–8.1)

## 2017-07-02 LAB — CBC WITH DIFFERENTIAL/PLATELET
Basophils Absolute: 0 10*3/uL (ref 0.0–0.1)
Basophils Relative: 0 %
Eosinophils Absolute: 0 10*3/uL (ref 0.0–0.7)
Eosinophils Relative: 0 %
HCT: 37.1 % — ABNORMAL LOW (ref 39.0–52.0)
Hemoglobin: 12.7 g/dL — ABNORMAL LOW (ref 13.0–17.0)
Lymphocytes Relative: 5 %
Lymphs Abs: 0.6 10*3/uL — ABNORMAL LOW (ref 0.7–4.0)
MCH: 26.8 pg (ref 26.0–34.0)
MCHC: 34.2 g/dL (ref 30.0–36.0)
MCV: 78.4 fL (ref 78.0–100.0)
Monocytes Absolute: 0.5 10*3/uL (ref 0.1–1.0)
Monocytes Relative: 5 %
Neutro Abs: 10.2 10*3/uL — ABNORMAL HIGH (ref 1.7–7.7)
Neutrophils Relative %: 90 %
Platelets: 213 10*3/uL (ref 150–400)
RBC: 4.73 MIL/uL (ref 4.22–5.81)
RDW: 12.8 % (ref 11.5–15.5)
WBC: 11.4 10*3/uL — ABNORMAL HIGH (ref 4.0–10.5)

## 2017-07-02 LAB — I-STAT CHEM 8, ED
BUN: 25 mg/dL — ABNORMAL HIGH (ref 6–20)
Calcium, Ion: 1.15 mmol/L (ref 1.15–1.40)
Chloride: 108 mmol/L (ref 101–111)
Creatinine, Ser: 2.5 mg/dL — ABNORMAL HIGH (ref 0.61–1.24)
Glucose, Bld: 178 mg/dL — ABNORMAL HIGH (ref 65–99)
HCT: 39 % (ref 39.0–52.0)
Hemoglobin: 13.3 g/dL (ref 13.0–17.0)
Potassium: 4.6 mmol/L (ref 3.5–5.1)
Sodium: 140 mmol/L (ref 135–145)
TCO2: 22 mmol/L (ref 22–32)

## 2017-07-02 LAB — HEMOGLOBIN A1C
Hgb A1c MFr Bld: 7.3 % — ABNORMAL HIGH (ref 4.8–5.6)
Mean Plasma Glucose: 162.81 mg/dL

## 2017-07-02 LAB — TROPONIN I
Troponin I: <0.03 ng/mL (ref <0.03)
Troponin I: 0.03 ng/mL (ref <0.03)
Troponin I: 0.04 ng/mL (ref <0.03)
Troponin I: 0.07 ng/mL (ref <0.03)
Troponin I: 0.05 ng/mL (ref <0.03)
Troponin I: 0.04 ng/mL (ref <0.03)

## 2017-07-02 LAB — I-STAT CG4 LACTIC ACID, ED: Lactic Acid, Venous: 1.6 mmol/L (ref 0.5–1.9)

## 2017-07-02 LAB — GLUCOSE, CAPILLARY
Glucose-Capillary: 173 mg/dL — ABNORMAL HIGH (ref 65–99)
Glucose-Capillary: 200 mg/dL — ABNORMAL HIGH (ref 65–99)

## 2017-07-02 LAB — LIPASE, BLOOD: Lipase: 58 U/L — ABNORMAL HIGH (ref 11–51)

## 2017-07-02 MED ORDER — SODIUM CHLORIDE 0.9% FLUSH
3.0000 mL | Freq: Two times a day (BID) | INTRAVENOUS | Status: DC
  Administered 2017-07-02 – 2017-07-03 (×2): 3 mL via INTRAVENOUS

## 2017-07-02 MED ORDER — LINAGLIPTIN 5 MG PO TABS
5.0000 mg | ORAL_TABLET | Freq: Every day | ORAL | Status: DC
  Administered 2017-07-02 – 2017-07-03 (×2): 5 mg via ORAL
  Filled 2017-07-02 (×2): qty 1

## 2017-07-02 MED ORDER — NIFEDIPINE ER OSMOTIC RELEASE 30 MG PO TB24
90.0000 mg | ORAL_TABLET | Freq: Every day | ORAL | Status: DC
  Administered 2017-07-02: 90 mg via ORAL
  Filled 2017-07-02 (×2): qty 3

## 2017-07-02 MED ORDER — INSULIN ASPART 100 UNIT/ML ~~LOC~~ SOLN
3.0000 [IU] | Freq: Three times a day (TID) | SUBCUTANEOUS | Status: DC
  Administered 2017-07-02 – 2017-07-03 (×2): 3 [IU] via SUBCUTANEOUS

## 2017-07-02 MED ORDER — ACETAMINOPHEN 650 MG RE SUPP: 650.0000 mg | Freq: Four times a day (QID) | RECTAL | Status: DC | PRN

## 2017-07-02 MED ORDER — GABAPENTIN 300 MG PO CAPS
300.0000 mg | ORAL_CAPSULE | Freq: Two times a day (BID) | ORAL | Status: DC
  Administered 2017-07-02 – 2017-07-03 (×2): 300 mg via ORAL
  Filled 2017-07-02 (×2): qty 1

## 2017-07-02 MED ORDER — INSULIN ASPART 100 UNIT/ML ~~LOC~~ SOLN
0.0000 [IU] | Freq: Three times a day (TID) | SUBCUTANEOUS | Status: DC
  Administered 2017-07-02: 2 [IU] via SUBCUTANEOUS
  Administered 2017-07-03: 5 [IU] via SUBCUTANEOUS

## 2017-07-02 MED ORDER — TAMSULOSIN HCL 0.4 MG PO CAPS
0.4000 mg | ORAL_CAPSULE | Freq: Two times a day (BID) | ORAL | Status: DC
  Administered 2017-07-02 – 2017-07-03 (×2): 0.4 mg via ORAL
  Filled 2017-07-02 (×2): qty 1

## 2017-07-02 MED ORDER — PRAVASTATIN SODIUM 40 MG PO TABS
40.0000 mg | ORAL_TABLET | Freq: Every evening | ORAL | Status: DC
  Administered 2017-07-02: 40 mg via ORAL
  Filled 2017-07-02: qty 1

## 2017-07-02 MED ORDER — ACETAMINOPHEN 325 MG PO TABS
650.0000 mg | ORAL_TABLET | Freq: Four times a day (QID) | ORAL | Status: DC | PRN
  Administered 2017-07-03: 650 mg via ORAL
  Filled 2017-07-02: qty 2

## 2017-07-02 MED ORDER — ASPIRIN 81 MG PO CHEW
81.0000 mg | CHEWABLE_TABLET | Freq: Every day | ORAL | Status: DC
  Administered 2017-07-02 – 2017-07-03 (×2): 81 mg via ORAL
  Filled 2017-07-02 (×2): qty 1

## 2017-07-02 MED ORDER — ONDANSETRON HCL 4 MG/2ML IJ SOLN: 4.0000 mg | Freq: Four times a day (QID) | INTRAMUSCULAR | Status: DC | PRN

## 2017-07-02 MED ORDER — ASPIRIN 81 MG PO CHEW
324.0000 mg | CHEWABLE_TABLET | Freq: Once | ORAL | Status: AC
Start: 2017-07-02 — End: 2017-07-02
  Administered 2017-07-02: 324 mg via ORAL
  Filled 2017-07-02: qty 4

## 2017-07-02 MED ORDER — INSULIN ASPART 100 UNIT/ML ~~LOC~~ SOLN: 0.0000 [IU] | Freq: Every day | SUBCUTANEOUS | Status: DC

## 2017-07-02 MED ORDER — GI COCKTAIL ~~LOC~~
30.0000 mL | Freq: Once | ORAL | Status: AC
  Administered 2017-07-02: 30 mL via ORAL
  Filled 2017-07-02: qty 30

## 2017-07-02 MED ORDER — PANTOPRAZOLE SODIUM 40 MG PO TBEC
40.0000 mg | DELAYED_RELEASE_TABLET | Freq: Every day | ORAL | Status: DC
  Administered 2017-07-02 – 2017-07-03 (×2): 40 mg via ORAL
  Filled 2017-07-02 (×2): qty 1

## 2017-07-02 MED ORDER — ALBUTEROL SULFATE HFA 108 (90 BASE) MCG/ACT IN AERS: 1.0000 | INHALATION_SPRAY | Freq: Four times a day (QID) | RESPIRATORY_TRACT | Status: DC | PRN

## 2017-07-02 MED ORDER — SODIUM CHLORIDE 0.9% FLUSH
3.0000 mL | Freq: Two times a day (BID) | INTRAVENOUS | Status: DC
  Administered 2017-07-03: 3 mL via INTRAVENOUS

## 2017-07-02 MED ORDER — SODIUM CHLORIDE 0.9 % IV SOLN: 250.0000 mL | INTRAVENOUS | Status: DC | PRN

## 2017-07-02 MED ORDER — SENNOSIDES-DOCUSATE SODIUM 8.6-50 MG PO TABS
1.0000 | ORAL_TABLET | Freq: Every evening | ORAL | Status: DC | PRN
  Administered 2017-07-02: 1 via ORAL
  Filled 2017-07-02: qty 1

## 2017-07-02 MED ORDER — LOSARTAN POTASSIUM 50 MG PO TABS
100.0000 mg | ORAL_TABLET | Freq: Every day | ORAL | Status: DC
  Administered 2017-07-02: 100 mg via ORAL
  Filled 2017-07-02 (×3): qty 2

## 2017-07-02 MED ORDER — ENOXAPARIN SODIUM 40 MG/0.4ML ~~LOC~~ SOLN
40.0000 mg | SUBCUTANEOUS | Status: DC
  Administered 2017-07-02: 40 mg via SUBCUTANEOUS
  Filled 2017-07-02: qty 0.4

## 2017-07-02 MED ORDER — SODIUM CHLORIDE 0.9% FLUSH: 3.0000 mL | INTRAVENOUS | Status: DC | PRN

## 2017-07-02 MED ORDER — VITAMIN D 1000 UNITS PO TABS
1000.0000 [IU] | ORAL_TABLET | ORAL | Status: DC
  Administered 2017-07-02: 1000 [IU] via ORAL
  Filled 2017-07-02: qty 1

## 2017-07-02 MED ORDER — NITROGLYCERIN 0.4 MG SL SUBL: 0.4000 mg | SUBLINGUAL_TABLET | SUBLINGUAL | Status: DC | PRN

## 2017-07-02 MED ORDER — ONDANSETRON HCL 4 MG PO TABS: 4.0000 mg | ORAL_TABLET | Freq: Four times a day (QID) | ORAL | Status: DC | PRN

## 2017-07-02 MED ORDER — ALBUTEROL SULFATE (2.5 MG/3ML) 0.083% IN NEBU: 2.5000 mg | INHALATION_SOLUTION | Freq: Four times a day (QID) | RESPIRATORY_TRACT | Status: DC | PRN

## 2017-07-02 MED ORDER — IOPAMIDOL (ISOVUE-300) INJECTION 61%
INTRAVENOUS | Status: AC
  Filled 2017-07-02: qty 30

## 2017-07-02 NOTE — ED Notes (Signed)
CRITICAL VALUE ALERT  Critical Value:  Troponin/0.07  Date & Time Notied:  07/02/17  Provider Notified: Dr Wyvonnia Dusky  Orders Received/Actions taken: see chart

## 2017-07-02 NOTE — Care Management Obs Status (Signed)
[place] NOTIFICATION   Patient Details  Name: Steve [name] MRN: [id number] Date of Birth: 1936-07-10   Medicare Observation Status Notification Given:  Yes    Sherald Barge, RN 07/02/2017, 2:40 PM

## 2017-07-02 NOTE — ED Notes (Signed)
Date and time results received: 07/02/17 0500 (use smartphrase ".now" to insert current time)  Test: troponin Critical Value: 0.03  Name of Provider Notified: Dr Wyvonnia Dusky  Orders Received? Or Actions Taken?: Actions Taken: no orders received

## 2017-07-02 NOTE — ED Notes (Signed)
Date and time results received: 07/02/17 0622   Test: trop Critical Value: 0.04  Name of Provider Notified:Dr. Rancour  Orders Received? Or Actions Taken?: Notified

## 2017-07-02 NOTE — H&P (Signed)
History and Physical    [name]:[id number] DOB: [birth year] DOA: 07/01/2017  Referring MD/NP/PA: Ezequiel Essex, EDP PCP: Rosita Fire, MD  Patient coming from: Home  Chief Complaint: Nausea, vomiting, abdominal pain  HPI: Steve [name] is a 81 y.o. male history of hypertension, prostate cancer and diabetes with chronic kidney disease who presents to the hospital today with the above complaints. Shortly after his arrival his symptoms abated. In the ED he was noticed to have normal vital signs, CT scan of the abdomen was negative for bowel obstruction or any other pathology. He however was noticed to have a troponin of 0.03, unclear as to why troponin was ordered in the first place. He had 2 subsequent troponins that were ordered that increased to 0.04 and then 0.07, prompting a call to [place] for admission and evaluation.  Past Medical/Surgical History: Past Medical History:  Diagnosis Date  . Arthritis   . Cancer [facility])    prostate  . Diabetes mellitus without complication ([place])   . Hypertension     Past Surgical History:  Procedure Laterality Date  . CATARACT EXTRACTION W/PHACO Right 04/28/2016   Procedure: CATARACT EXTRACTION PHACO AND INTRAOCULAR LENS PLACEMENT (IOC);  Surgeon: Williams Che, MD;  Location: [facility];  Service: Ophthalmology;  Laterality: Right;  CDE: 4.28  . COLONOSCOPY     about 2 years in [place]   . CYSTECTOMY      Social History:  reports that he quit smoking about 29 years ago. His smoking use included Pipe and Cigars. He started smoking about 62 years ago. He has a 50.00 pack-year smoking history. He has never used smokeless tobacco. He reports that he does not drink alcohol or use drugs.  Allergies: No Known Allergies  Family History:  Family History  Problem Relation Age of Onset  . Heart disease Mother        cause of death  . Diabetes Sister   . Colon cancer Neg Hx     Prior to Admission medications   Medication Sig Start Date End  Date Taking? Authorizing Provider  acetaminophen (TYLENOL) 650 MG CR tablet Take 650 mg by mouth every 8 (eight) hours as needed for pain.   Yes [provider]  albuterol (PROVENTIL HFA;VENTOLIN HFA) 108 (90 Base) MCG/ACT inhaler Inhale 1-2 puffs into the lungs every 6 (six) hours as needed for wheezing or shortness of breath.   Yes [provider]  aspirin 81 MG tablet Take 81 mg by mouth daily.   Yes [provider]  Cholecalciferol (VITAMIN D-3) 1000 UNITS CAPS Take 1 capsule by mouth once a week.    Yes [provider]  diazepam (VALIUM) 5 MG tablet Take 5 mg by mouth 2 (two) times daily as needed (for pelvic pain).   Yes [provider]  docusate sodium (COLACE) 100 MG capsule Take 100 mg by mouth daily.    Yes [provider]  furosemide (LASIX) 20 MG tablet Take 20 mg by mouth daily as needed for fluid.   Yes [provider]  gabapentin (NEURONTIN) 300 MG capsule Take 1 capsule by mouth 2 (two) times daily. 01/19/15  Yes [provider]  linagliptin (TRADJENTA) 5 MG TABS tablet Take 5 mg by mouth daily.   Yes [provider]  losartan (COZAAR) 100 MG tablet Take 1 tablet by mouth daily. 04/06/13  Yes [provider]  Lysine 500 MG CAPS Take 500 mg by mouth daily.   Yes [provider]  NIFEdipine (PROCARDIA XL/ADALAT-CC) 90 MG 24 hr tablet TAKE 1 TABLET ONCE DAILY. 03/10/17  Yes Herminio Commons, MD  omeprazole (PRILOSEC) 20 MG capsule Take 1 capsule by mouth 2 (two) times daily. 04/06/13  Yes [provider]  pravastatin (PRAVACHOL) 40 MG tablet Take 1 tablet by mouth every evening.  04/06/13  Yes [provider]  tamsulosin (FLOMAX) 0.4 MG CAPS Take 1 capsule by mouth 2 (two) times daily.  04/06/13  Yes [provider]  nitroGLYCERIN (NITROSTAT) 0.4 MG SL tablet Place 0.4 mg under the tongue every 5 (five) minutes as needed for chest pain.    [provider]     Review of Systems:  Constitutional: Denies fever, chills, diaphoresis, appetite change and fatigue.  HEENT: Denies photophobia, eye pain, redness, hearing loss, ear pain, congestion, sore throat, rhinorrhea, sneezing, mouth sores, trouble swallowing, neck pain, neck stiffness and tinnitus.   Respiratory: Denies SOB, DOE, cough, chest tightness,  and wheezing.   Cardiovascular: Denies chest pain, palpitations and leg swelling.  Gastrointestinal: Denies nausea, vomiting, abdominal pain, diarrhea, constipation, blood in stool and abdominal distention.  Genitourinary: Denies dysuria, urgency, frequency, hematuria, flank pain and difficulty urinating.  Endocrine: Denies: hot or cold intolerance, sweats, changes in hair or nails, polyuria, polydipsia. Musculoskeletal: Denies myalgias, back pain, joint swelling, arthralgias and gait problem.  Skin: Denies pallor, rash and wound.  Neurological: Denies dizziness, seizures, syncope, weakness, light-headedness, numbness and headaches.  Hematological: Denies adenopathy. Easy bruising, personal or family bleeding history  Psychiatric/Behavioral: Denies suicidal ideation, mood changes, confusion, nervousness, sleep disturbance and agitation    Physical Exam: Vitals:   07/02/17 1230 07/02/17 1400 07/02/17 1424 07/02/17 1459  BP: (!) 143/76 138/73 134/70 140/65  Pulse: 81 76 82 78  Resp: 13 18 (!) 24 (!) 21  Temp:   98.2 F (36.8 C) 98 F (36.7 C)  TempSrc:   Oral Oral  SpO2: 95% 98%  100%  Weight:    79 kg (174 lb 2.6 oz)  Height:    5\' 5"  (1.651 m)     Constitutional: NAD, calm, comfortable Eyes: PERRL, lids and conjunctivae normal ENMT: Mucous membranes are moist. Posterior pharynx clear of any exudate or lesions.Normal dentition.  Neck: normal, supple, no masses, no thyromegaly Respiratory: clear to auscultation bilaterally, no wheezing, no crackles. Normal respiratory effort. No accessory muscle use.  Cardiovascular: Regular rate and  rhythm, no murmurs / rubs / gallops. No extremity edema. 2+ pedal pulses. No carotid bruits.  Abdomen: no tenderness, no masses palpated. No hepatosplenomegaly. Bowel sounds positive.  Musculoskeletal: no clubbing / cyanosis. No joint deformity upper and lower extremities. Good ROM, no contractures. Normal muscle tone.  Skin: no rashes, lesions, ulcers. No induration Neurologic: CN 2-12 grossly intact. Sensation intact, DTR normal. Strength 5/5 in all 4.  Psychiatric: Normal judgment and insight. Alert and oriented x 3. Normal mood.    Labs on Admission: I have personally reviewed the following labs and imaging studies  CBC:  Recent Labs Lab 07/02/17 0012 07/02/17 0025  WBC 11.4*  --   NEUTROABS 10.2*  --   HGB 12.7* 13.3  HCT 37.1* 39.0  MCV 78.4  --   PLT 213  --    Basic Metabolic Panel:  Recent Labs Lab 07/02/17 0012 07/02/17 0025  NA 135 140  K 4.5 4.6  CL 104 108  CO2 20*  --   GLUCOSE 171* 178*  BUN 24* 25*  CREATININE 2.32* 2.50*  CALCIUM 9.3  --  GFR: Estimated Creatinine Clearance: 23.2 mL/min (A) (by C-G formula based on SCr of 2.5 mg/dL (H)). Liver Function Tests:  Recent Labs Lab 07/02/17 0012  AST 110*  ALT 55  ALKPHOS 116  BILITOT 0.6  PROT 8.2*  ALBUMIN 4.3    Recent Labs Lab 07/02/17 0012  LIPASE 58*   No results for input(s): AMMONIA in the last 168 hours. Coagulation Profile: No results for input(s): INR, PROTIME in the last 168 hours. Cardiac Enzymes:  Recent Labs Lab 07/02/17 0012 07/02/17 0347 07/02/17 0543 07/02/17 0635  TROPONINI <0.03 0.03* 0.04* 0.07*   BNP (last 3 results) No results for input(s): PROBNP in the last 8760 hours. HbA1C: No results for input(s): HGBA1C in the last 72 hours. CBG: No results for input(s): GLUCAP in the last 168 hours. Lipid Profile: No results for input(s): CHOL, HDL, LDLCALC, TRIG, CHOLHDL, LDLDIRECT in the last 72 hours. Thyroid Function Tests: No results for input(s): TSH,  T4TOTAL, FREET4, T3FREE, THYROIDAB in the last 72 hours. Anemia Panel: No results for input(s): VITAMINB12, FOLATE, FERRITIN, TIBC, IRON, RETICCTPCT in the last 72 hours. Urine analysis:    Component Value Date/Time   COLORURINE STRAW (A) 07/01/2017 2327   APPEARANCEUR CLEAR 07/01/2017 2327   LABSPEC 1.008 07/01/2017 2327   PHURINE 7.0 07/01/2017 2327   GLUCOSEU NEGATIVE 07/01/2017 2327   HGBUR NEGATIVE 07/01/2017 2327   BILIRUBINUR NEGATIVE 07/01/2017 2327   KETONESUR NEGATIVE 07/01/2017 2327   PROTEINUR >=300 (A) 07/01/2017 2327   NITRITE NEGATIVE 07/01/2017 2327   LEUKOCYTESUR NEGATIVE 07/01/2017 2327   Sepsis Labs: @LABRCNTIP (procalcitonin:4,lacticidven:4) )No results found for this or any previous visit (from the past 240 hour(s)).   Radiological Exams on Admission: Ct Abdomen Pelvis Wo Contrast  Result Date: 07/02/2017 CLINICAL DATA:  Abdomen distension history of prostate cancer EXAM: CT ABDOMEN AND PELVIS WITHOUT CONTRAST TECHNIQUE: Multidetector CT imaging of the abdomen and pelvis was performed following the standard protocol without IV contrast. COMPARISON:  Radiograph 07/01/2017, CT 02/10/2017 FINDINGS: Lower chest: Lung bases demonstrate no acute consolidation or pleural effusion. Borderline to mild cardiomegaly. Trace pericardial effusion. Hepatobiliary: No focal liver abnormality is seen. No gallstones, gallbladder wall thickening, or biliary dilatation. Pancreas: Unremarkable. No pancreatic ductal dilatation or surrounding inflammatory changes. Spleen: Normal in size without focal abnormality. Adrenals/Urinary Tract: Adrenal glands are within normal limits. No hydronephrosis. Hypodense renal cortical lesions most likely cysts. Bladder unremarkable Stomach/Bowel: Stomach is within normal limits. Small distal esophageal hiatal hernia. Most of the contrast is in the stomach. Appendix appears normal. No evidence of bowel wall thickening, distention, or inflammatory changes.  Vascular/Lymphatic: Aortic atherosclerosis. No enlarged abdominal or pelvic lymph nodes. Reproductive: Metallic densities in the region of the prostate. Other: Negative for free air or free fluid. Musculoskeletal: Degenerative changes of the spine. No acute or suspicious bone lesion IMPRESSION: 1. Negative for a bowel obstruction. Moderate gastric enlargement. Small distal esophageal hiatal hernia. 2. Negative for hydronephrosis or ureteral stone Electronically Signed   By: Donavan Foil M.D.   On: 07/02/2017 03:05   Dg Abdomen Acute W/chest  Result Date: 07/01/2017 CLINICAL DATA:  Abdominal pain and vomiting. History of diabetes and hypertension. Former smoker. EXAM: DG ABDOMEN ACUTE W/ 1V CHEST COMPARISON:  Chest 01/15/2017.  Abdomen 11/14/2016 FINDINGS: Shallow inspiration. Mild linear atelectasis in the right lung base. Mild cardiac enlargement. No vascular congestion. No edema or consolidation. No blunting of costophrenic angles. No pneumothorax. Calcified and tortuous aorta. Degenerative changes in the spine. Scattered gas and stool throughout the colon.  Scattered gas in the small bowel. No small or large bowel distention. No free intra-abdominal air. No abnormal air-fluid levels. No radiopaque stones. Visualized bones appear intact. Surgical clips in the low pelvis. IMPRESSION: Mild cardiac enlargement. Shallow inspiration with atelectasis in the right lung base. No evidence of active pulmonary disease. Nonobstructive bowel gas pattern. Electronically Signed   By: Lucienne Capers M.D.   On: 07/01/2017 23:42    EKG: Independently reviewed. Sinus rhythm, left axis deviation, diffuse T-wave inversions that are new compared to EKG from April  Assessment/Plan Principal Problem:   Elevated troponin Active Problems:   HTN (hypertension)   DM (diabetes mellitus), type 2 with renal complications (HCC)    Elevated troponin/EKG changes -He has not had true anginal symptoms with chest pain and  shortness of breath. However his nausea and vomiting and upper abdominal pain could certainly be an anginal equivalent. -His troponins are only minimally elevated and this could be explained simply by his renal dysfunction, however I am concerned about his new T-wave inversions on EKG. -Admit to telemetry, continue to cycle troponins, repeat EKG in a.m., obtain 2-D echo, will also request cardiology consultation in a.m.  Chronic kidney disease stage 3-4 -Creatinine is at baseline of around 2.3-2.6.  Hypertension -Fair control, continue home medications  Diabetes -Check A1c, place on a sensitive sliding scale     DVT prophylaxis: Lovenox  Code Status: Full code  Family Communication: Patient only  Disposition Plan: Pending medical stability  Consults called: Cardiology  Admission status: Observation    Time Spent: 65 minutes   Lelon Frohlich MD [facility] Pager [phone]  If 7PM-7AM, please contact night-coverage [email or web address] Password [email or web address]  07/02/2017, 4:21 PM

## 2017-07-03 ENCOUNTER — Observation Stay (HOSPITAL_BASED_OUTPATIENT_CLINIC_OR_DEPARTMENT_OTHER): Payer: Medicare HMO

## 2017-07-03 ENCOUNTER — Encounter (HOSPITAL_COMMUNITY): Payer: Self-pay | Admitting: Cardiology

## 2017-07-03 DIAGNOSIS — R1084 Generalized abdominal pain: Secondary | ICD-10-CM | POA: Diagnosis not present

## 2017-07-03 DIAGNOSIS — E1122 Type 2 diabetes mellitus with diabetic chronic kidney disease: Secondary | ICD-10-CM | POA: Diagnosis not present

## 2017-07-03 DIAGNOSIS — I10 Essential (primary) hypertension: Secondary | ICD-10-CM | POA: Diagnosis not present

## 2017-07-03 DIAGNOSIS — R748 Abnormal levels of other serum enzymes: Secondary | ICD-10-CM

## 2017-07-03 DIAGNOSIS — Z794 Long term (current) use of insulin: Secondary | ICD-10-CM

## 2017-07-03 DIAGNOSIS — I361 Nonrheumatic tricuspid (valve) insufficiency: Secondary | ICD-10-CM

## 2017-07-03 DIAGNOSIS — N183 Chronic kidney disease, stage 3 (moderate): Secondary | ICD-10-CM | POA: Diagnosis not present

## 2017-07-03 DIAGNOSIS — R1013 Epigastric pain: Secondary | ICD-10-CM | POA: Diagnosis not present

## 2017-07-03 LAB — ECHOCARDIOGRAM COMPLETE
Height: 65 in
Weight: 2786.61 oz

## 2017-07-03 LAB — BASIC METABOLIC PANEL
Anion gap: 9 (ref 5–15)
BUN: 30 mg/dL — ABNORMAL HIGH (ref 6–20)
CO2: 20 mmol/L — ABNORMAL LOW (ref 22–32)
Calcium: 8.7 mg/dL — ABNORMAL LOW (ref 8.9–10.3)
Chloride: 107 mmol/L (ref 101–111)
Creatinine, Ser: 3.01 mg/dL — ABNORMAL HIGH (ref 0.61–1.24)
GFR calc Af Amer: 21 mL/min — ABNORMAL LOW (ref >60)
GFR calc non Af Amer: 18 mL/min — ABNORMAL LOW (ref >60)
Glucose, Bld: 169 mg/dL — ABNORMAL HIGH (ref 65–99)
Potassium: 4.3 mmol/L (ref 3.5–5.1)
Sodium: 136 mmol/L (ref 135–145)

## 2017-07-03 LAB — GLUCOSE, CAPILLARY
Glucose-Capillary: 130 mg/dL — ABNORMAL HIGH (ref 65–99)
Glucose-Capillary: 267 mg/dL — ABNORMAL HIGH (ref 65–99)

## 2017-07-03 LAB — CBC
HCT: 33.1 % — ABNORMAL LOW (ref 39.0–52.0)
Hemoglobin: 11.5 g/dL — ABNORMAL LOW (ref 13.0–17.0)
MCH: 26.9 pg (ref 26.0–34.0)
MCHC: 34.7 g/dL (ref 30.0–36.0)
MCV: 77.3 fL — ABNORMAL LOW (ref 78.0–100.0)
Platelets: 214 10*3/uL (ref 150–400)
RBC: 4.28 MIL/uL (ref 4.22–5.81)
RDW: 13.1 % (ref 11.5–15.5)
WBC: 8.1 10*3/uL (ref 4.0–10.5)

## 2017-07-03 LAB — TROPONIN I: Troponin I: 0.04 ng/mL (ref <0.03)

## 2017-07-03 MED ORDER — ENOXAPARIN SODIUM 30 MG/0.3ML ~~LOC~~ SOLN: 30.0000 mg | SUBCUTANEOUS | Status: DC

## 2017-07-03 NOTE — Progress Notes (Signed)
Discharge instructions given, verbalized understanding, out in stable condition ambulatory with staff. 

## 2017-07-03 NOTE — Progress Notes (Signed)
Reviewed all discharge instructions with no questions or concerns. Removed IV site. Called family and informed of discharge and need of ride pick up. Will continue to monitor.

## 2017-07-03 NOTE — Consult Note (Signed)
Cardiology Consultation:   Patient ID: Steve [name]; [id number]; [birth year]   Admit date: 07/01/2017 Date of Consult: 07/03/2017  Primary Care Provider: Rosita Fire, MD Primary Cardiologist: Dr. Kate Sable   Patient Profile:   Steve [name] is a 81 y.o. male with a history of type 2 diabetes mellitus, hypertension, and CKD stage III who is being seen today for the evaluation of abnormal troponin levels at the request of Dr. Jerilee Hoh.  History of Present Illness:   Steve [name] presents to the hospital reporting a prolonged episode of abdominal pain, moderate intensity and sharp in description, began after eating a piece of birthday cake. He also had nausea and emesis associated with this. Symptoms lasted for nearly 6 hours. He states he has had recurrent abdominal pain similar to this in the past. He does not report any chest pain or palpitations, no unusual shortness of breath, obvious fevers or chills, no cough or hemoptysis.  As part of his evaluation, troponin I levels were checked. Minor abnormalities are noted with levels of 0.05, 0.04, and 0.04. ECG shows sinus rhythm with leftward axis and diffuse ST-T wave abnormalities, likely repolarization changes. Abdominal and pelvic CT showed no acute findings with moderate gastric enlargement, small hiatal hernia, no hydronephrosis, incidentally noted trivial pericardial effusion. He has subsequently been documented to have a fever with temperature 101.4 degrees last evening.  Past Medical History:  Diagnosis Date  . Arthritis   . CKD (chronic kidney disease) stage 3, GFR 30-59 ml/min   . Essential hypertension   . Prostate cancer ([place])   . Type 2 diabetes mellitus ([place])     Past Surgical History:  Procedure Laterality Date  . CATARACT EXTRACTION W/PHACO Right 04/28/2016   Procedure: CATARACT EXTRACTION PHACO AND INTRAOCULAR LENS PLACEMENT (IOC);  Surgeon: Williams Che, MD;  Location: [facility];  Service:  Ophthalmology;  Laterality: Right;  CDE: 4.28  . COLONOSCOPY     about 2 years in [place]   . CYSTECTOMY       Inpatient Medications: Scheduled Meds: . aspirin  81 mg Oral Daily  . cholecalciferol  1,000 Units Oral Weekly  . enoxaparin (LOVENOX) injection  40 mg Subcutaneous Q24H  . gabapentin  300 mg Oral BID  . insulin aspart  0-5 Units Subcutaneous QHS  . insulin aspart  0-9 Units Subcutaneous TID WC  . insulin aspart  3 Units Subcutaneous TID WC  . linagliptin  5 mg Oral Daily  . pantoprazole  40 mg Oral Daily  . pravastatin  40 mg Oral QPM  . sodium chloride flush  3 mL Intravenous Q12H  . sodium chloride flush  3 mL Intravenous Q12H  . tamsulosin  0.4 mg Oral BID   Continuous Infusions: . sodium chloride     PRN Meds: sodium chloride, acetaminophen **OR** acetaminophen, albuterol, nitroGLYCERIN, ondansetron **OR** ondansetron (ZOFRAN) IV, senna-docusate, sodium chloride flush  Allergies:   No Known Allergies  Social History:   Social History   Social History  . Marital status: Divorced    Spouse name: N/A  . Number of children: N/A  . Years of education: N/A   Occupational History  . Retired    Social History Main Topics  . Smoking status: Former Smoker    Packs/day: 2.00    Years: 25.00    Types: Pipe, Cigars    Start date: 11/08/1954    Quit date: 10/14/1987  . Smokeless tobacco: Never Used     Comment: About 2 pipes a day  .  Alcohol use No  . Drug use: No  . Sexual activity: Not on file   Other Topics Concern  . Not on file   Social History Narrative  . No narrative on file    Family History:   The patient's family history includes Diabetes in his sister; Heart disease in his mother. There is no history of Colon cancer.  ROS:  Please see the history of present illness.  No typical exertional chest pain, NYHA class II dyspnea with typical activities, no palpitations or syncope. All other ROS reviewed and negative.     Physical Exam/Data:    Vitals:   07/02/17 1459 07/02/17 2211 07/03/17 0011 07/03/17 0659  BP: 140/65 (!) 147/68  (!) 125/58  Pulse: 78 94  90  Resp: (!) 21 20  15   Temp: 98 F (36.7 C) (!) 100.5 F (38.1 C) (!) 101.4 F (38.6 C) 99 F (37.2 C)  TempSrc: Oral Oral  Oral  SpO2: 100% 100%  98%  Weight: 174 lb 2.6 oz (79 kg)     Height: 5\' 5"  (1.651 m)       Intake/Output Summary (Last 24 hours) at 07/03/17 1014 Last data filed at 07/02/17 1821  Gross per 24 hour  Intake              360 ml  Output              550 ml  Net             -190 ml   Filed Weights   07/02/17 1459  Weight: 174 lb 2.6 oz (79 kg)   Body mass index is 28.98 kg/m.   Gen: Patient appears comfortable at rest. HEENT: Conjunctiva and lids normal, oropharynx clear. Neck: Supple, no elevated JVP or carotid bruits, no thyromegaly. Lungs: Clear to auscultation, nonlabored breathing at rest. Cardiac: Regular rate and rhythm, no S3, soft systolic murmur, no pericardial rub. Abdomen: Soft, nontender, bowel sounds present, no guarding or rebound. Extremities: No pitting edema, distal pulses 2+. Skin: Warm and dry. Musculoskeletal: No kyphosis. Neuropsychiatric: Alert and oriented x3, affect grossly appropriate.  EKG:  I personally reviewed the tracing from 07/03/2017 which shows sinus rhythm with leftward axis, probable left atrial enlargement, nonspecific ST-T wave abnormalities, possible repolarization changes.  Telemetry:  I personally reviewed telemetry which shows sinus rhythm and sinus tachycardia.  Relevant CV Studies:  Lexiscan Myoview 10/30/2015:  No diagnostic ST segment changes to indicate ischemia.  Trivial region of possible anteroapical ischemia noted. No large ischemic territories or scar however.  This is a low risk study.  Nuclear stress EF: 58%.  Laboratory Data:  Chemistry  Recent Labs Lab 07/02/17 0012 07/02/17 0025 07/03/17 0433  NA 135 140 136  K 4.5 4.6 4.3  CL 104 108 107  CO2 20*  --   20*  GLUCOSE 171* 178* 169*  BUN 24* 25* 30*  CREATININE 2.32* 2.50* 3.01*  CALCIUM 9.3  --  8.7*  GFRNONAA 25*  --  18*  GFRAA 29*  --  21*  ANIONGAP 11  --  9     Recent Labs Lab 07/02/17 0012  PROT 8.2*  ALBUMIN 4.3  AST 110*  ALT 55  ALKPHOS 116  BILITOT 0.6   Hematology  Recent Labs Lab 07/02/17 0012 07/02/17 0025 07/03/17 0433  WBC 11.4*  --  8.1  RBC 4.73  --  4.28  HGB 12.7* 13.3 11.5*  HCT 37.1* 39.0 33.1*  MCV 78.4  --  77.3*  MCH 26.8  --  26.9  MCHC 34.2  --  34.7  RDW 12.8  --  13.1  PLT 213  --  214   Cardiac Enzymes  Recent Labs Lab 07/02/17 0347 07/02/17 0543 07/02/17 0635 07/02/17 1637 07/02/17 2131 07/03/17 0433  TROPONINI 0.03* 0.04* 0.07* 0.05* 0.04* 0.04*   No results for input(s): TROPIPOC in the last 168 hours.   Radiology/Studies:  Ct Abdomen Pelvis Wo Contrast  Result Date: 07/02/2017 CLINICAL DATA:  Abdomen distension history of prostate cancer EXAM: CT ABDOMEN AND PELVIS WITHOUT CONTRAST TECHNIQUE: Multidetector CT imaging of the abdomen and pelvis was performed following the standard protocol without IV contrast. COMPARISON:  Radiograph 07/01/2017, CT 02/10/2017 FINDINGS: Lower chest: Lung bases demonstrate no acute consolidation or pleural effusion. Borderline to mild cardiomegaly. Trace pericardial effusion. Hepatobiliary: No focal liver abnormality is seen. No gallstones, gallbladder wall thickening, or biliary dilatation. Pancreas: Unremarkable. No pancreatic ductal dilatation or surrounding inflammatory changes. Spleen: Normal in size without focal abnormality. Adrenals/Urinary Tract: Adrenal glands are within normal limits. No hydronephrosis. Hypodense renal cortical lesions most likely cysts. Bladder unremarkable Stomach/Bowel: Stomach is within normal limits. Small distal esophageal hiatal hernia. Most of the contrast is in the stomach. Appendix appears normal. No evidence of bowel wall thickening, distention, or inflammatory  changes. Vascular/Lymphatic: Aortic atherosclerosis. No enlarged abdominal or pelvic lymph nodes. Reproductive: Metallic densities in the region of the prostate. Other: Negative for free air or free fluid. Musculoskeletal: Degenerative changes of the spine. No acute or suspicious bone lesion IMPRESSION: 1. Negative for a bowel obstruction. Moderate gastric enlargement. Small distal esophageal hiatal hernia. 2. Negative for hydronephrosis or ureteral stone Electronically Signed   By: Donavan Foil M.D.   On: 07/02/2017 03:05   Dg Abdomen Acute W/chest  Result Date: 07/01/2017 CLINICAL DATA:  Abdominal pain and vomiting. History of diabetes and hypertension. Former smoker. EXAM: DG ABDOMEN ACUTE W/ 1V CHEST COMPARISON:  Chest 01/15/2017.  Abdomen 11/14/2016 FINDINGS: Shallow inspiration. Mild linear atelectasis in the right lung base. Mild cardiac enlargement. No vascular congestion. No edema or consolidation. No blunting of costophrenic angles. No pneumothorax. Calcified and tortuous aorta. Degenerative changes in the spine. Scattered gas and stool throughout the colon. Scattered gas in the small bowel. No small or large bowel distention. No free intra-abdominal air. No abnormal air-fluid levels. No radiopaque stones. Visualized bones appear intact. Surgical clips in the low pelvis. IMPRESSION: Mild cardiac enlargement. Shallow inspiration with atelectasis in the right lung base. No evidence of active pulmonary disease. Nonobstructive bowel gas pattern. Electronically Signed   By: Lucienne Capers M.D.   On: 07/01/2017 23:42    Assessment and Plan:   1. Minor elevation in troponin I in relatively flat pattern not suggestive of ACS. More consistent with myocardial strain. Patient does not report any recent chest pain or shortness of breath. ECG is abnormal but nonspecific. He does have potential underlying ischemic heart disease based on mildly abnormal Myoview from 2017 demonstrating a trivial anteroapical  ischemic territory. LVEF was normal at that time.  2. CKD stage III, current creatinine 3.0. Follows with nephrology.  3. Type 2 diabetes mellitus, on insulin. He follows with Dr. Legrand Rams. Hemoglobin A1c 7.3%.  4. Essential hypertension.  5. Presentation with acute abdominal discomfort, etiology not certain at this point. Denies any diarrhea, has had recent nausea and emesis. Noted to be febrile under hospital observation as well. Further workup per primary team.  As noted above, troponin I elevations are not  suggestive of ACS and he has not had any recent chest pain or shortness of breath. More than likely consistent with myocardial strain (also in setting of CKD), also incidentally has a trivial pericardial effusion based on CT imaging. Would obtain an echocardiogram to follow-up for any structural cardiac abnormalities that may need further evaluation. Would not anticipate additional ischemic testing at this particular time.   Signed, Rozann Lesches, MD  07/03/2017 10:14 AM

## 2017-07-03 NOTE — Discharge Summary (Signed)
Physician Discharge Summary  Steve [name] ZOX:[id number] DOB: 01/13/1936 DOA: 07/01/2017  PCP: Rosita Fire, MD  Admit date: 07/01/2017 Discharge date: 07/03/2017  Time spent: 45 minutes  Recommendations for Outpatient Follow-up:  -To be discharged home today. -Advised to follow-up with primary care provider in 2 weeks.   Discharge Diagnoses:  Principal Problem:   Elevated troponin Active Problems:   HTN (hypertension)   DM (diabetes mellitus), type 2 with renal complications [facility])   Discharge Condition: Stable and improved  Filed Weights   07/02/17 1459  Weight: 79 kg (174 lb 2.6 oz)    History of present illness:  Steve [name] is a 81 y.o. male history of hypertension, prostate cancer and diabetes with chronic kidney disease who presents to the hospital today with the above complaints. Shortly after his arrival his symptoms abated. In the ED he was noticed to have normal vital signs, CT scan of the abdomen was negative for bowel obstruction or any other pathology. He however was noticed to have a troponin of 0.03, unclear as to why troponin was ordered in the first place. He had 2 subsequent troponins that were ordered that increased to 0.04 and then 0.07, prompting a call to [place] for admission and evaluation.  Hospital Course:   Elevated troponin/EKG changes -Has not had anginal symptoms. -Seen in consultation by cardiology who believes EKG changes although abnormal is nonspecific. -Troponin elevation is mild and flat not highly suggestive of ACS, could simply be secondary to chronic kidney disease. -Echo with ejection fraction of 60-65% and grade 1 diastolic dysfunction. -Cardiology is not planning on further ischemic testing at this time.  Chronic kidney disease stage 3-4 -Creatinine remains at baseline of between 2.6 and 3.  Hypertension -Continue home medications  Diabetes -Fairly well controlled.  Procedures:  None    Consultations:  Cardiology  Discharge Instructions  Discharge Instructions    Diet - low sodium heart healthy    Complete by:  As directed    Increase activity slowly    Complete by:  As directed      Allergies as of 07/03/2017   No Known Allergies     Medication List    STOP taking these medications   furosemide 20 MG tablet Commonly known as:  LASIX     TAKE these medications   acetaminophen 650 MG CR tablet Commonly known as:  TYLENOL Take 650 mg by mouth every 8 (eight) hours as needed for pain.   albuterol 108 (90 Base) MCG/ACT inhaler Commonly known as:  PROVENTIL HFA;VENTOLIN HFA Inhale 1-2 puffs into the lungs every 6 (six) hours as needed for wheezing or shortness of breath.   aspirin 81 MG tablet Take 81 mg by mouth daily.   diazepam 5 MG tablet Commonly known as:  VALIUM Take 5 mg by mouth 2 (two) times daily as needed (for pelvic pain).   docusate sodium 100 MG capsule Commonly known as:  COLACE Take 100 mg by mouth daily.   gabapentin 300 MG capsule Commonly known as:  NEURONTIN Take 1 capsule by mouth 2 (two) times daily.   losartan 100 MG tablet Commonly known as:  COZAAR Take 1 tablet by mouth daily.   Lysine 500 MG Caps Take 500 mg by mouth daily.   NIFEdipine 90 MG 24 hr tablet Commonly known as:  PROCARDIA XL/ADALAT-CC TAKE 1 TABLET ONCE DAILY.   nitroGLYCERIN 0.4 MG SL tablet Commonly known as:  NITROSTAT Place 0.4 mg under the tongue every 5 (five)  minutes as needed for chest pain.   omeprazole 20 MG capsule Commonly known as:  PRILOSEC Take 1 capsule by mouth 2 (two) times daily.   pravastatin 40 MG tablet Commonly known as:  PRAVACHOL Take 1 tablet by mouth every evening.   tamsulosin 0.4 MG Caps capsule Commonly known as:  FLOMAX Take 1 capsule by mouth 2 (two) times daily.   TRADJENTA 5 MG Tabs tablet Generic drug:  linagliptin Take 5 mg by mouth daily.   Vitamin D-3 1000 units Caps Take 1 capsule by mouth  once a week.            Discharge Care Instructions        Start     Ordered   07/03/17 0000  Increase activity slowly     07/03/17 1326   07/03/17 0000  Diet - low sodium heart healthy     07/03/17 1326     No Known Allergies Follow-up Information    Rosita Fire, MD. Schedule an appointment as soon as possible for a visit on 07/17/2017.   Specialty:  Internal Medicine Why:  11:00 am Contact information: [place] [address] [phone]            The results of significant diagnostics from this hospitalization (including imaging, microbiology, ancillary and laboratory) are listed below for reference.    Significant Diagnostic Studies: Ct Abdomen Pelvis Wo Contrast  Result Date: 07/02/2017 CLINICAL DATA:  Abdomen distension history of prostate cancer EXAM: CT ABDOMEN AND PELVIS WITHOUT CONTRAST TECHNIQUE: Multidetector CT imaging of the abdomen and pelvis was performed following the standard protocol without IV contrast. COMPARISON:  Radiograph 07/01/2017, CT 02/10/2017 FINDINGS: Lower chest: Lung bases demonstrate no acute consolidation or pleural effusion. Borderline to mild cardiomegaly. Trace pericardial effusion. Hepatobiliary: No focal liver abnormality is seen. No gallstones, gallbladder wall thickening, or biliary dilatation. Pancreas: Unremarkable. No pancreatic ductal dilatation or surrounding inflammatory changes. Spleen: Normal in size without focal abnormality. Adrenals/Urinary Tract: Adrenal glands are within normal limits. No hydronephrosis. Hypodense renal cortical lesions most likely cysts. Bladder unremarkable Stomach/Bowel: Stomach is within normal limits. Small distal esophageal hiatal hernia. Most of the contrast is in the stomach. Appendix appears normal. No evidence of bowel wall thickening, distention, or inflammatory changes. Vascular/Lymphatic: Aortic atherosclerosis. No enlarged abdominal or pelvic lymph nodes.  Reproductive: Metallic densities in the region of the prostate. Other: Negative for free air or free fluid. Musculoskeletal: Degenerative changes of the spine. No acute or suspicious bone lesion IMPRESSION: 1. Negative for a bowel obstruction. Moderate gastric enlargement. Small distal esophageal hiatal hernia. 2. Negative for hydronephrosis or ureteral stone Electronically Signed   By: Donavan Foil M.D.   On: 07/02/2017 03:05   Dg Abdomen Acute W/chest  Result Date: 07/01/2017 CLINICAL DATA:  Abdominal pain and vomiting. History of diabetes and hypertension. Former smoker. EXAM: DG ABDOMEN ACUTE W/ 1V CHEST COMPARISON:  Chest 01/15/2017.  Abdomen 11/14/2016 FINDINGS: Shallow inspiration. Mild linear atelectasis in the right lung base. Mild cardiac enlargement. No vascular congestion. No edema or consolidation. No blunting of costophrenic angles. No pneumothorax. Calcified and tortuous aorta. Degenerative changes in the spine. Scattered gas and stool throughout the colon. Scattered gas in the small bowel. No small or large bowel distention. No free intra-abdominal air. No abnormal air-fluid levels. No radiopaque stones. Visualized bones appear intact. Surgical clips in the low pelvis. IMPRESSION: Mild cardiac enlargement. Shallow inspiration with atelectasis in the right lung base. No evidence of active pulmonary disease.  Nonobstructive bowel gas pattern. Electronically Signed   By: Lucienne Capers M.D.   On: 07/01/2017 23:42    Microbiology: No results found for this or any previous visit (from the past 240 hour(s)).   Labs: Basic Metabolic Panel:  Recent Labs Lab 07/02/17 0012 07/02/17 0025 07/03/17 0433  NA 135 140 136  K 4.5 4.6 4.3  CL 104 108 107  CO2 20*  --  20*  GLUCOSE 171* 178* 169*  BUN 24* 25* 30*  CREATININE 2.32* 2.50* 3.01*  CALCIUM 9.3  --  8.7*   Liver Function Tests:  Recent Labs Lab 07/02/17 0012  AST 110*  ALT 55  ALKPHOS 116  BILITOT 0.6  PROT 8.2*   ALBUMIN 4.3    Recent Labs Lab 07/02/17 0012  LIPASE 58*   No results for input(s): AMMONIA in the last 168 hours. CBC:  Recent Labs Lab 07/02/17 0012 07/02/17 0025 07/03/17 0433  WBC 11.4*  --  8.1  NEUTROABS 10.2*  --   --   HGB 12.7* 13.3 11.5*  HCT 37.1* 39.0 33.1*  MCV 78.4  --  77.3*  PLT 213  --  214   Cardiac Enzymes:  Recent Labs Lab 07/02/17 0543 07/02/17 0635 07/02/17 1637 07/02/17 2131 07/03/17 0433  TROPONINI 0.04* 0.07* 0.05* 0.04* 0.04*   BNP: BNP (last 3 results) No results for input(s): BNP in the last 8760 hours.  ProBNP (last 3 results) No results for input(s): PROBNP in the last 8760 hours.  CBG:  Recent Labs Lab 07/02/17 1632 07/02/17 2209 07/03/17 0746 07/03/17 1129  GLUCAP 173* 200* 130* 267*       Signed:  Lelon Frohlich  [facility] Pager: [phone] 07/03/2017, 3:42 PM

## 2017-07-03 NOTE — Progress Notes (Signed)
*  PRELIMINARY RESULTS* Echocardiogram 2D Echocardiogram has been performed.  Leavy Cella 07/03/2017, 12:58 PM

## 2017-07-17 DIAGNOSIS — N184 Chronic kidney disease, stage 4 (severe): Secondary | ICD-10-CM | POA: Diagnosis not present

## 2017-07-17 DIAGNOSIS — I10 Essential (primary) hypertension: Secondary | ICD-10-CM | POA: Diagnosis not present

## 2017-07-17 DIAGNOSIS — E785 Hyperlipidemia, unspecified: Secondary | ICD-10-CM | POA: Diagnosis not present

## 2017-07-17 DIAGNOSIS — Z23 Encounter for immunization: Secondary | ICD-10-CM | POA: Diagnosis not present

## 2017-07-17 DIAGNOSIS — E1142 Type 2 diabetes mellitus with diabetic polyneuropathy: Secondary | ICD-10-CM | POA: Diagnosis not present

## 2017-07-21 ENCOUNTER — Ambulatory Visit (INDEPENDENT_AMBULATORY_CARE_PROVIDER_SITE_OTHER): Payer: Medicare HMO | Admitting: Urology

## 2017-07-21 DIAGNOSIS — R102 Pelvic and perineal pain: Secondary | ICD-10-CM | POA: Diagnosis not present

## 2017-07-21 DIAGNOSIS — C61 Malignant neoplasm of prostate: Secondary | ICD-10-CM

## 2017-08-11 DIAGNOSIS — E1142 Type 2 diabetes mellitus with diabetic polyneuropathy: Secondary | ICD-10-CM | POA: Diagnosis not present

## 2017-08-25 DIAGNOSIS — E1142 Type 2 diabetes mellitus with diabetic polyneuropathy: Secondary | ICD-10-CM | POA: Diagnosis not present

## 2017-08-25 DIAGNOSIS — N184 Chronic kidney disease, stage 4 (severe): Secondary | ICD-10-CM | POA: Diagnosis not present

## 2017-08-25 DIAGNOSIS — M791 Myalgia, unspecified site: Secondary | ICD-10-CM | POA: Diagnosis not present

## 2017-08-25 DIAGNOSIS — I10 Essential (primary) hypertension: Secondary | ICD-10-CM | POA: Diagnosis not present

## 2017-08-27 ENCOUNTER — Ambulatory Visit (INDEPENDENT_AMBULATORY_CARE_PROVIDER_SITE_OTHER): Payer: Medicare HMO | Admitting: Otolaryngology

## 2017-08-31 ENCOUNTER — Other Ambulatory Visit: Payer: Medicare HMO | Admitting: Orthotics

## 2017-08-31 ENCOUNTER — Ambulatory Visit (INDEPENDENT_AMBULATORY_CARE_PROVIDER_SITE_OTHER): Payer: Medicare HMO

## 2017-08-31 ENCOUNTER — Ambulatory Visit: Payer: Medicare HMO | Admitting: Podiatry

## 2017-08-31 ENCOUNTER — Encounter: Payer: Self-pay | Admitting: Podiatry

## 2017-08-31 VITALS — BP 169/89 | HR 76 | Resp 16

## 2017-08-31 DIAGNOSIS — M722 Plantar fascial fibromatosis: Secondary | ICD-10-CM

## 2017-08-31 DIAGNOSIS — E0843 Diabetes mellitus due to underlying condition with diabetic autonomic (poly)neuropathy: Secondary | ICD-10-CM | POA: Diagnosis not present

## 2017-08-31 NOTE — Progress Notes (Signed)
   HPI: Patient with a history of diabetes mellitus presents today for evaluation of chronic foot fatigue and numbness to the bilateral feet. This is been ongoing for several years. Patient's been diagnosed with diabetes mellitus since 2002.  Past Medical History:  Diagnosis Date  . Arthritis   . CKD (chronic kidney disease) stage 3, GFR 30-59 ml/min (HCC)   . Essential hypertension   . Prostate cancer ([place])   . Type 2 diabetes mellitus ([place])      Physical Exam: General: The patient is alert and oriented x3 in no acute distress.  Dermatology: Skin is warm, dry and supple bilateral lower extremities. Negative for open lesions or macerations.  Vascular: Palpable pedal pulses bilaterally. No edema or erythema noted. Capillary refill within normal limits.  Neurological: Epicritic and protective threshold diminished bilaterally.   Musculoskeletal Exam: Range of motion within normal limits to all pedal and ankle joints bilateral. Muscle strength 5/5 in all groups bilateral. There is some mild pain on palpation to the mid substance of the plantar medial arch bilateral feet.  Radiographic Exam:  Normal osseous mineralization. Joint spaces preserved. No fracture/dislocation/boney destruction.    Assessment: -  Diabetes mellitus with polyneuropathy - Plantar fasciitis bilateral   Plan of Care:  - Patient was evaluated today. X-rays reviewed today. - Recommend that the patient wear good supportive shoe gear. - Stressed the importance of maintaining appropriate blood sugar levels. -  Return to clinic when necessary   Edrick Kins, DPM [facility]  Dr. Edrick Kins, DPM    [address], [place] [address]                Office [phone]  Fax [phone]

## 2017-09-13 IMAGING — CT CT RENAL STONE PROTOCOL
2 of 3 series · 17 of 46 positions shown, 19 images · non-contrast
Comparison: 10/24/2016 and 10/09/2014

CLINICAL DATA: Lower abdominal pain and bilateral groin pain 2
weeks. History prostate cancer post radiation treatment 9205.

EXAM:
CT ABDOMEN AND PELVIS WITHOUT CONTRAST
TECHNIQUE: Multidetector CT imaging of the abdomen and pelvis was performed
following the standard protocol without IV contrast.

[Series 2: axial st · axial · 0.77mm/px · z∈[+911,+1281]mm · 14 of 86 slices shown, 16 images]
[im 6/86  soft-tissue]
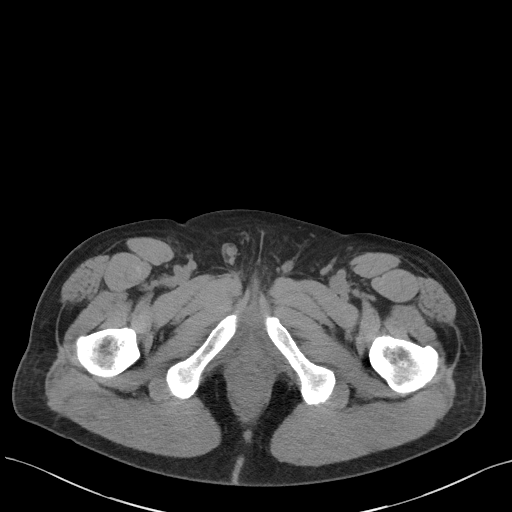
[im 6/86  bone]
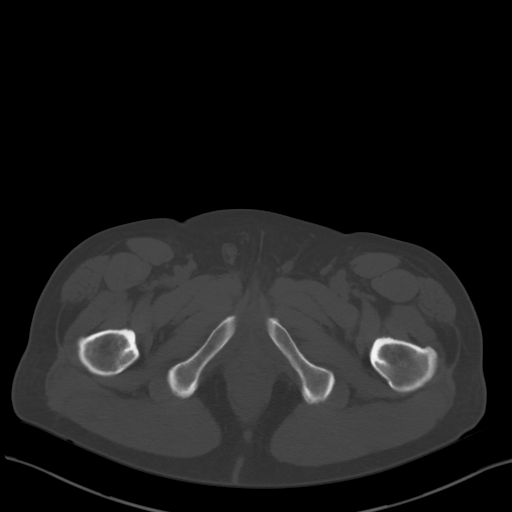
[im 11/86  soft-tissue]
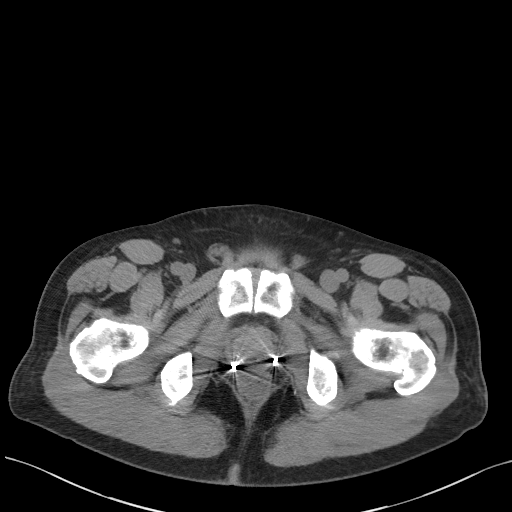
[im 17/86  soft-tissue]
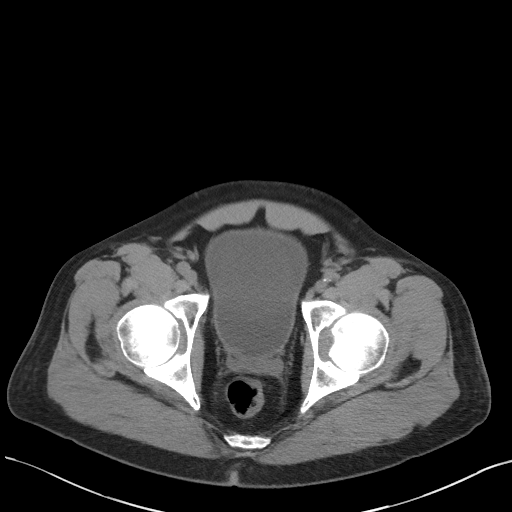
[im 22/86  soft-tissue]
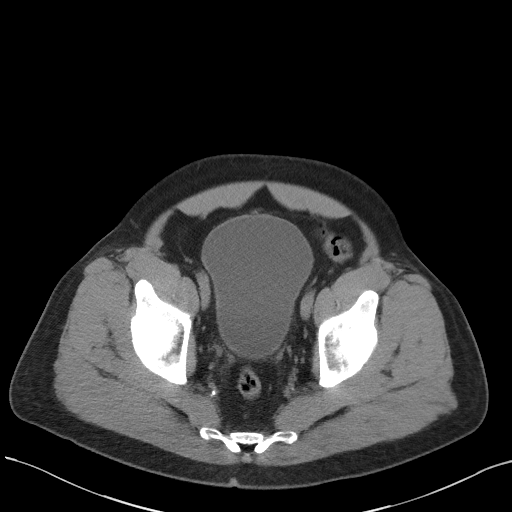
[im 28/86  soft-tissue]
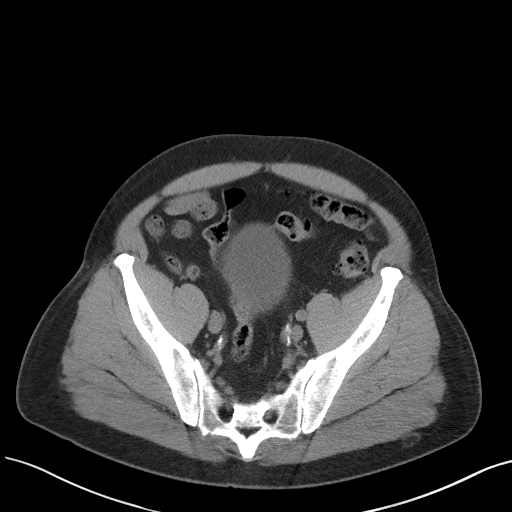
[im 33/86  soft-tissue]
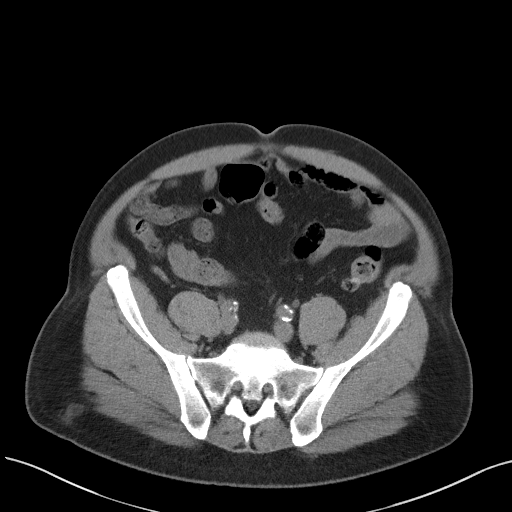
[im 39/86  soft-tissue]
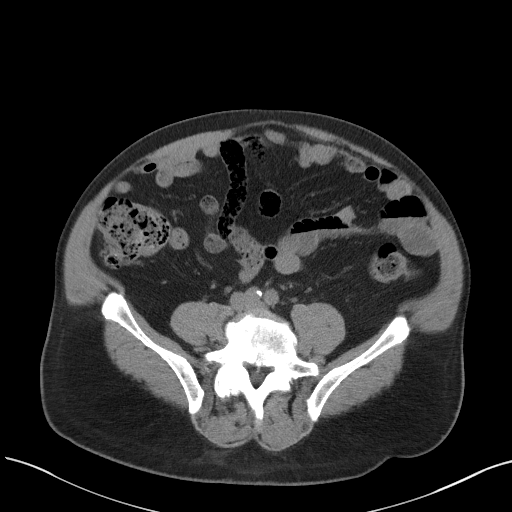
[im 47/86  soft-tissue]
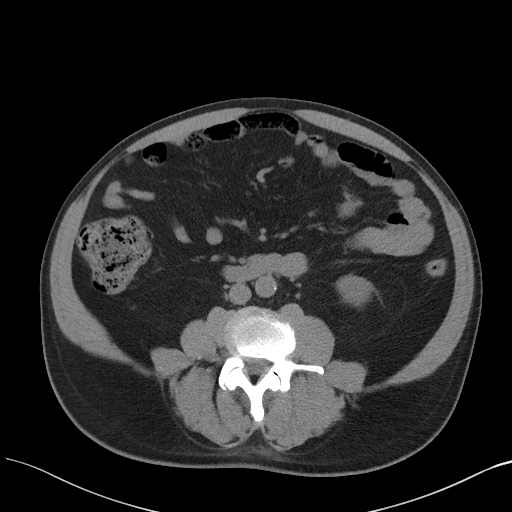
[im 53/86  soft-tissue]
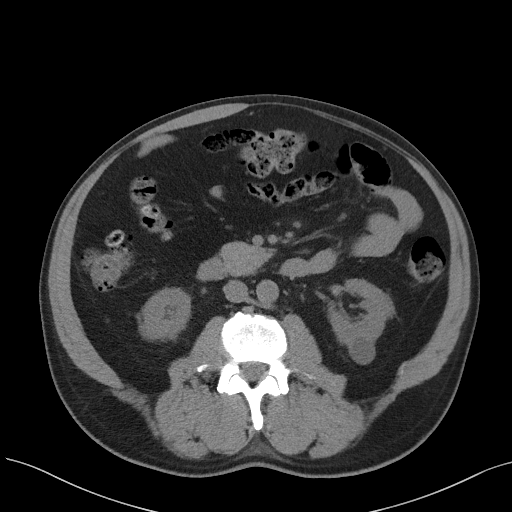
[im 53/86  bone]
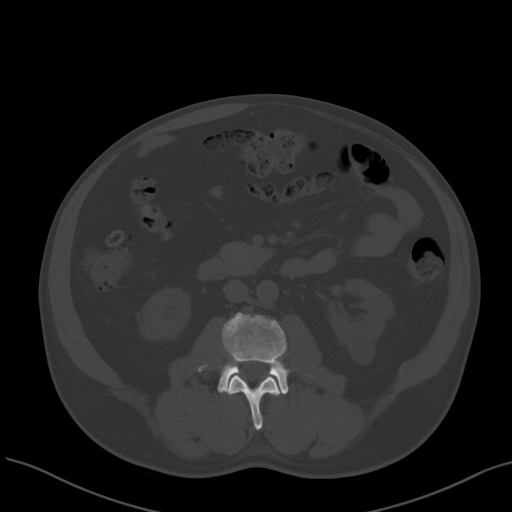
[im 58/86  soft-tissue]
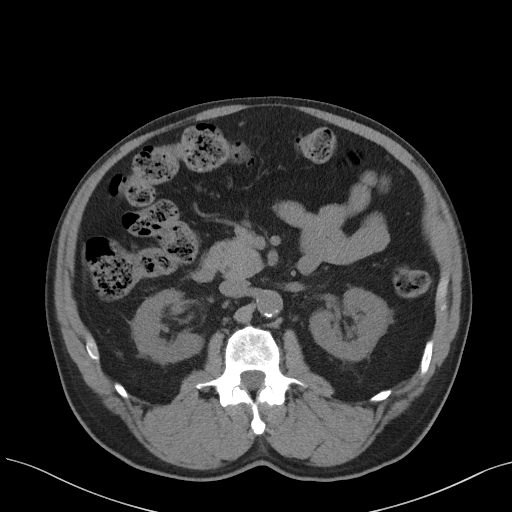
[im 64/86  soft-tissue]
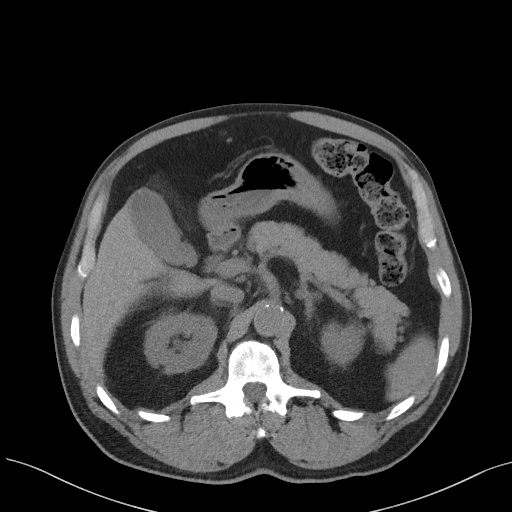
[im 69/86  soft-tissue]
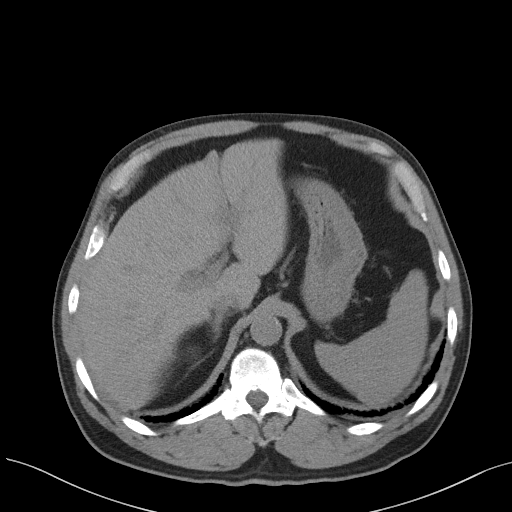
[im 75/86  soft-tissue]
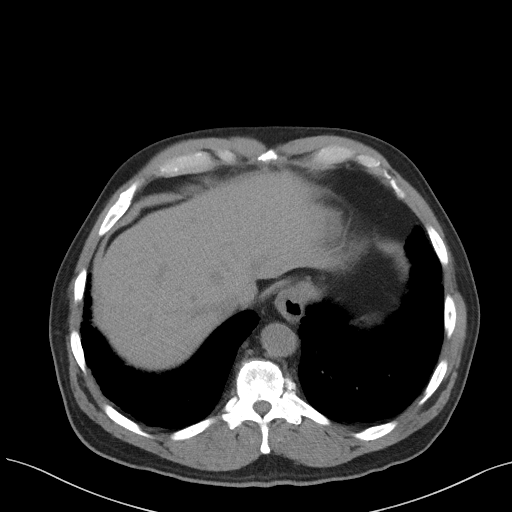
[im 80/86  soft-tissue]
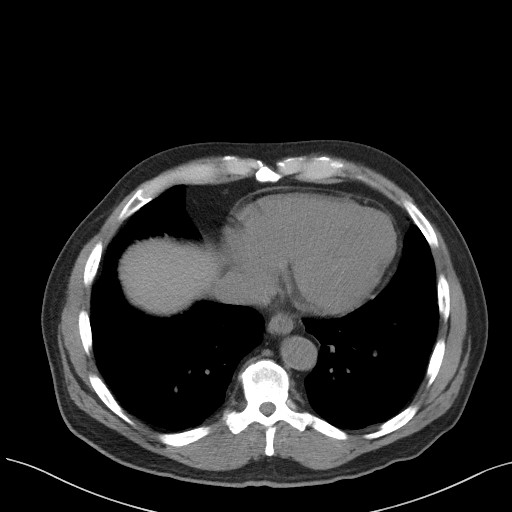

[Series 5: coronal st · coronal · 0.72mm/px · 3 of 98 slices shown]
[im 33/98  soft-tissue]
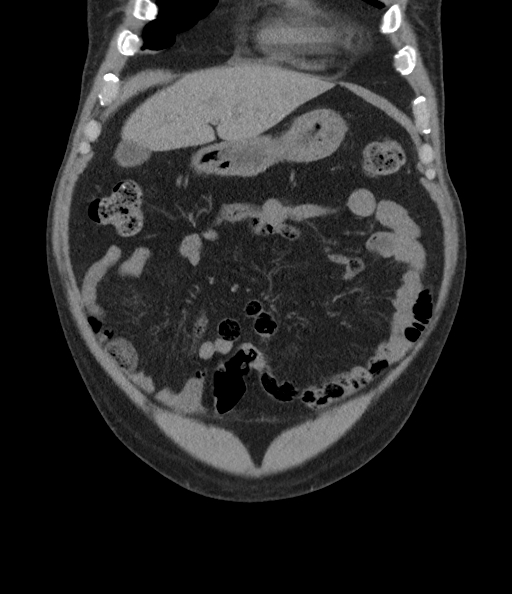
[im 44/98  soft-tissue]
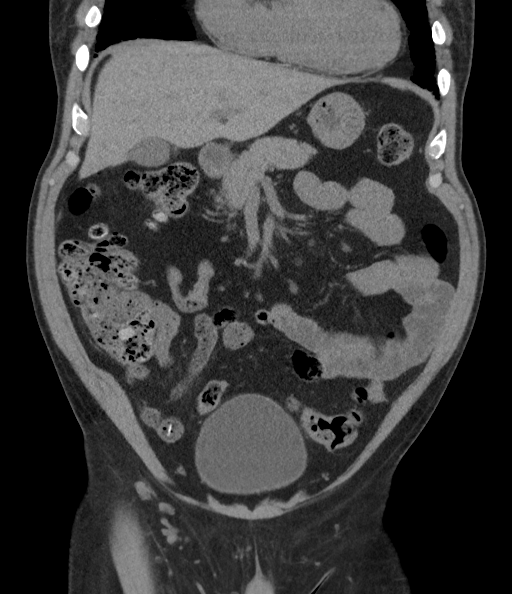
[im 54/98  soft-tissue]
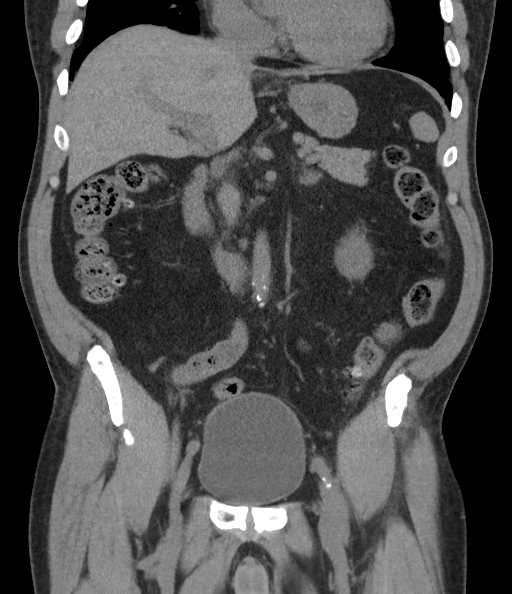

[17 of 46 positions shown; findings below may reference images not displayed]

FINDINGS: Lower chest: Minimal chronic interstitial change over the
posteromedial left base. No consolidation or effusion.

Hepatobiliary: Within normal.

Pancreas: Within normal.

Spleen: Within normal.

Adrenals/Urinary Tract: Adrenal glands are normal. Kidneys are
normal in size without hydronephrosis or nephrolithiasis. There are
several bilateral renal cysts out significant change. Ureters and
bladder are normal.

Stomach/Bowel: Stomach and small bowel are normal. Appendix is
normal. Minimal diverticulosis of the colon.

Vascular/Lymphatic: Mild calcified plaque over the abdominal aorta
and iliac arteries. No evidence of adenopathy.

Reproductive: Fiducial markers over the prostate compatible with
previous radiation treatment.

Other: No free fluid or focal inflammatory change.

Musculoskeletal: Mild degenerate change of the spine and hips.
IMPRESSION: No acute findings in the abdomen/pelvis. No nephroureterolithiasis
or obstruction.

Bilateral renal cysts.

Mild colonic diverticulosis.

Aortic atherosclerosis.

## 2017-10-16 ENCOUNTER — Other Ambulatory Visit: Payer: Self-pay | Admitting: *Deleted

## 2017-10-16 MED ORDER — NIFEDIPINE ER OSMOTIC RELEASE 90 MG PO TB24: 90.0000 mg | ORAL_TABLET | Freq: Every day | ORAL | 6 refills | Status: DC

## 2017-10-19 DIAGNOSIS — C61 Malignant neoplasm of prostate: Secondary | ICD-10-CM | POA: Diagnosis not present

## 2017-10-19 DIAGNOSIS — I10 Essential (primary) hypertension: Secondary | ICD-10-CM | POA: Diagnosis not present

## 2017-10-19 DIAGNOSIS — E119 Type 2 diabetes mellitus without complications: Secondary | ICD-10-CM | POA: Diagnosis not present

## 2017-10-19 DIAGNOSIS — N184 Chronic kidney disease, stage 4 (severe): Secondary | ICD-10-CM | POA: Diagnosis not present

## 2017-10-19 DIAGNOSIS — E1142 Type 2 diabetes mellitus with diabetic polyneuropathy: Secondary | ICD-10-CM | POA: Diagnosis not present

## 2017-10-21 ENCOUNTER — Encounter: Payer: Self-pay | Admitting: Cardiovascular Disease

## 2017-10-21 ENCOUNTER — Other Ambulatory Visit: Payer: Self-pay

## 2017-10-21 ENCOUNTER — Ambulatory Visit: Payer: Medicare HMO | Admitting: Cardiovascular Disease

## 2017-10-21 VITALS — BP 150/66 | HR 80 | Ht 65.0 in | Wt 184.0 lb

## 2017-10-21 DIAGNOSIS — N183 Chronic kidney disease, stage 3 unspecified: Secondary | ICD-10-CM

## 2017-10-21 DIAGNOSIS — R0602 Shortness of breath: Secondary | ICD-10-CM

## 2017-10-21 DIAGNOSIS — I10 Essential (primary) hypertension: Secondary | ICD-10-CM

## 2017-10-21 DIAGNOSIS — E1121 Type 2 diabetes mellitus with diabetic nephropathy: Secondary | ICD-10-CM

## 2017-10-21 DIAGNOSIS — E785 Hyperlipidemia, unspecified: Secondary | ICD-10-CM

## 2017-10-21 MED ORDER — HYDRALAZINE HCL 25 MG PO TABS: 25.0000 mg | ORAL_TABLET | Freq: Three times a day (TID) | ORAL | 6 refills | Status: DC

## 2017-10-21 NOTE — Progress Notes (Signed)
SUBJECTIVE: The patient presents for annual follow-up.  He has a history of hypertension, prostate cancer, diabetes, and chronic kidney disease.  He was hospitalized in September 2018 with a minimally elevated troponin without anginal symptoms.  There were felt to be nonspecific.  Echocardiogram demonstrated normal left ventricular systolic function, LVEF 61-95%, with grade 1 diastolic dysfunction.  He underwent a low risk nuclear stress test on 10/30/15 which demonstrated a trivial region of possible anteroapical ischemia, LVEF 58%.  He denies chest pain.  He has no complaints about significant exertional dyspnea.  He said his blood pressure has been high for the past 3 or 4 months.  He is also noticed bilateral feet swelling.  He has some issues with right ear tinnitus and a "thumping" sensation.    Review of Systems: As per "subjective", otherwise negative.  Not on File  Current Outpatient Medications  Medication Sig Dispense Refill  . acetaminophen (TYLENOL) 650 MG CR tablet Take 650 mg by mouth every 8 (eight) hours as needed for pain.    Marland Kitchen albuterol (PROVENTIL HFA;VENTOLIN HFA) 108 (90 Base) MCG/ACT inhaler Inhale 1-2 puffs into the lungs every 6 (six) hours as needed for wheezing or shortness of breath.    Marland Kitchen aspirin 81 MG tablet Take 81 mg by mouth daily.    . Cholecalciferol (VITAMIN D-3) 1000 UNITS CAPS Take 1 capsule by mouth once a week.     . diazepam (VALIUM) 5 MG tablet Take 5 mg by mouth 2 (two) times daily as needed (for pelvic pain).    Marland Kitchen docusate sodium (COLACE) 100 MG capsule Take 100 mg by mouth daily.     Marland Kitchen gabapentin (NEURONTIN) 300 MG capsule Take 1 capsule by mouth 2 (two) times daily.    Marland Kitchen linagliptin (TRADJENTA) 5 MG TABS tablet Take 5 mg by mouth daily.    Marland Kitchen losartan (COZAAR) 100 MG tablet Take 1 tablet by mouth daily.    Marland Kitchen Lysine 500 MG CAPS Take 500 mg by mouth daily.    Marland Kitchen NIFEdipine (PROCARDIA XL/ADALAT-CC) 90 MG 24 hr tablet Take 1 tablet (90 mg total)  by mouth daily. 30 tablet 6  . omeprazole (PRILOSEC) 20 MG capsule Take 1 capsule by mouth 2 (two) times daily.    . pravastatin (PRAVACHOL) 40 MG tablet Take 1 tablet by mouth every evening.     . tamsulosin (FLOMAX) 0.4 MG CAPS Take 1 capsule by mouth 2 (two) times daily.      No current facility-administered medications for this visit.     Past Medical History:  Diagnosis Date  . Arthritis   . CKD (chronic kidney disease) stage 3, GFR 30-59 ml/min (HCC)   . Essential hypertension   . Prostate cancer ([place])   . Type 2 diabetes mellitus ([place])     Past Surgical History:  Procedure Laterality Date  . CATARACT EXTRACTION W/PHACO Right 04/28/2016   Procedure: CATARACT EXTRACTION PHACO AND INTRAOCULAR LENS PLACEMENT (I[facility]);  Surgeon: Williams Che, MD;  Location: [facility];  Service: Ophthalmology;  Laterality: Right;  CDE: 4.28  . COLONOSCOPY     about 2 years in [place]   . CYSTECTOMY      Social History   Socioeconomic History  . Marital status: Divorced    Spouse name: Not on file  . Number of children: Not on file  . Years of education: Not on file  . Highest education level: Not on file  Social Needs  . Financial resource strain:  Not on file  . Food insecurity - worry: Not on file  . Food insecurity - inability: Not on file  . Transportation needs - medical: Not on file  . Transportation needs - non-medical: Not on file  Occupational History  . Occupation: Retired  Tobacco Use  . Smoking status: Former Smoker    Packs/day: 2.00    Years: 25.00    Pack years: 50.00    Types: Pipe, Cigars    Start date: 11/08/1954    Last attempt to quit: 10/14/1987    Years since quitting: 30.0  . Smokeless tobacco: Never Used  . Tobacco comment: About 2 pipes a day  Substance and Sexual Activity  . Alcohol use: No    Alcohol/week: 0.0 oz  . Drug use: No  . Sexual activity: Not on file  Other Topics Concern  . Not on file  Social History Narrative  . Not on file     Vitals:    10/21/17 1300  BP: (!) 150/66  Pulse: 80  SpO2: 99%  Weight: 184 lb (83.5 kg)  Height: 5\' 5"  (1.651 m)    Wt Readings from Last 3 Encounters:  10/21/17 184 lb (83.5 kg)  07/02/17 174 lb 2.6 oz (79 kg)  04/13/17 178 lb (80.7 kg)     PHYSICAL EXAM General: NAD HEENT: Normal. Neck: No JVD, no thyromegaly. Lungs: Clear to auscultation bilaterally with normal respiratory effort. CV: Regular rate and rhythm, normal S1/S2, no S3/S4, no murmur. No pretibial or periankle edema.  No carotid bruit.   Abdomen: Soft, nontender, no distention.  Neurologic: Alert and oriented.  Psych: Normal affect. Skin: Normal. Musculoskeletal: No gross deformities.    ECG: Most recent ECG reviewed.   Labs: Lab Results  Component Value Date/Time   K 4.3 07/03/2017 04:33 AM   BUN 30 (H) 07/03/2017 04:33 AM   CREATININE 3.01 (H) 07/03/2017 04:33 AM   ALT 55 07/02/2017 12:12 AM   HGB 11.5 (L) 07/03/2017 04:33 AM     Lipids: No results found for: LDLCALC, LDLDIRECT, CHOL, TRIG, HDL     ASSESSMENT AND PLAN:  1. Shortness of breath: Symptomatically stable. Exercise Cardiolite stress test in 10/2015 was low risk. Cardiovascular risk factors include HTN, hyperlipidemia and diabetes. ECG was abnormal. Continue ASA and statin.  Previous spirometry was normal.  2. Essential HTN: Elevated.  I will start hydralazine 25 mg 3 times daily.  He is already taking losartan 100 mg and long-acting nifedipine 90 mg.  3. Prostate cancer: Sees Dr. Exie Parody. Completed radiation treatment.   4. Type 2 diabetes: On linagliptin.  5. Hyperlipidemia: On statin therapy.     Disposition: Follow up 1 year   Kate Sable, M.D., F.A.C.C.

## 2017-10-21 NOTE — Patient Instructions (Signed)
Medication Instructions:   Begin Hydralazine 25mg  three times per day.  Continue all other medications.    Labwork: none  Testing/Procedures: none  Follow-Up: Your physician wants you to follow up in:  1 year.  You will receive a reminder letter in the mail one-two months in advance.  If you don't receive a letter, please call our office to schedule the follow up appointment   Any Other Special Instructions Will Be Listed Below (If Applicable).  If you need a refill on your cardiac medications before your next appointment, please call your pharmacy.

## 2017-10-21 NOTE — Addendum Note (Signed)
Addended by: Laurine Blazer on: 10/21/2017 01:15 PM   Modules accepted: Orders

## 2017-10-22 ENCOUNTER — Other Ambulatory Visit: Payer: Self-pay

## 2017-10-22 ENCOUNTER — Encounter (HOSPITAL_COMMUNITY): Payer: Self-pay

## 2017-10-22 ENCOUNTER — Emergency Department (HOSPITAL_COMMUNITY)
Admission: EM | Admit: 2017-10-22 | Discharge: 2017-10-22 | Disposition: A | Discharge status: Home / Self Care | Payer: Medicare HMO | Attending: Emergency Medicine | Admitting: Emergency Medicine

## 2017-10-22 DIAGNOSIS — Z87891 Personal history of nicotine dependence: Secondary | ICD-10-CM | POA: Diagnosis not present

## 2017-10-22 DIAGNOSIS — R600 Localized edema: Secondary | ICD-10-CM | POA: Insufficient documentation

## 2017-10-22 DIAGNOSIS — Z7984 Long term (current) use of oral hypoglycemic drugs: Secondary | ICD-10-CM | POA: Diagnosis not present

## 2017-10-22 DIAGNOSIS — Z79899 Other long term (current) drug therapy: Secondary | ICD-10-CM | POA: Diagnosis not present

## 2017-10-22 DIAGNOSIS — R51 Headache: Secondary | ICD-10-CM | POA: Insufficient documentation

## 2017-10-22 DIAGNOSIS — R809 Proteinuria, unspecified: Secondary | ICD-10-CM | POA: Diagnosis not present

## 2017-10-22 DIAGNOSIS — D509 Iron deficiency anemia, unspecified: Secondary | ICD-10-CM | POA: Diagnosis not present

## 2017-10-22 DIAGNOSIS — Z7982 Long term (current) use of aspirin: Secondary | ICD-10-CM | POA: Diagnosis not present

## 2017-10-22 DIAGNOSIS — Z8546 Personal history of malignant neoplasm of prostate: Secondary | ICD-10-CM | POA: Insufficient documentation

## 2017-10-22 DIAGNOSIS — I10 Essential (primary) hypertension: Secondary | ICD-10-CM

## 2017-10-22 DIAGNOSIS — E559 Vitamin D deficiency, unspecified: Secondary | ICD-10-CM | POA: Diagnosis not present

## 2017-10-22 DIAGNOSIS — H9319 Tinnitus, unspecified ear: Secondary | ICD-10-CM | POA: Diagnosis not present

## 2017-10-22 DIAGNOSIS — E1122 Type 2 diabetes mellitus with diabetic chronic kidney disease: Secondary | ICD-10-CM | POA: Diagnosis not present

## 2017-10-22 DIAGNOSIS — I129 Hypertensive chronic kidney disease with stage 1 through stage 4 chronic kidney disease, or unspecified chronic kidney disease: Secondary | ICD-10-CM | POA: Diagnosis not present

## 2017-10-22 DIAGNOSIS — N183 Chronic kidney disease, stage 3 (moderate): Secondary | ICD-10-CM | POA: Diagnosis not present

## 2017-10-22 NOTE — ED Triage Notes (Signed)
Pt arrives from home c/o hypertension (193/80) and headache/migraine stating his head is throbbing. Denies numbness or tingling in his arms and chest pain.

## 2017-10-22 NOTE — ED Provider Notes (Signed)
[facility] EMERGENCY DEPARTMENT Provider Note   CSN: [id number] Arrival date & time: 10/22/17  0207     History   Chief Complaint Chief Complaint  Patient presents with  . Hypertension  . Migraine    HPI Steve [name] is a 82 y.o. male.  The history is provided by the patient.  Hypertension  This is a chronic problem. The current episode started more than 1 week ago. The problem occurs daily. The problem has been gradually worsening. Associated symptoms include headaches. Pertinent negatives include no chest pain, no abdominal pain and no shortness of breath. Nothing aggravates the symptoms. Nothing relieves the symptoms.  Migraine  Associated symptoms include headaches. Pertinent negatives include no chest pain, no abdominal pain and no shortness of breath.  Patient with history of chronic kidney disease not on dialysis, diabetes, hypertension, presents with 2 complaints. He reports ongoing issues with his high blood pressure for quite a while, and was seen by his cardiologist yesterday and was started on hydralazine 3 times daily in addition to his Cozaar and nifedipine He also reports intermittent episodes of "thump, thump" in his head for over a month.  He also reports tenderness in both ears for over a month. Tonight while resting, he felt that his thumping in his head was worsening as well as his tinnitus.  He was concerned he might be having a stroke No new visual changes, no new weakness, no numbness. No chest pain, no shortness of breath He has had slight weight gain recently as well as some mild lower extremity edema  Past Medical History:  Diagnosis Date  . Arthritis   . CKD (chronic kidney disease) stage 3, GFR 30-59 ml/min (HCC)   . Essential hypertension   . Prostate cancer ([place])   . Type 2 diabetes mellitus [facility])     Patient Active Problem List   Diagnosis Date Noted  . Elevated troponin 07/02/2017  . DM (diabetes mellitus), type 2 with renal  complications ([place]) 42/35/3614  . IBS (irritable bowel syndrome) 01/28/2017  . Prostate cancer ([place]) 07/10/2014  . Abnormal ECG 04/12/2013  . HTN (hypertension) 04/12/2013  . SOB (shortness of breath) 04/12/2013  . CHEST PAIN-UNSPECIFIED 06/12/2009    Past Surgical History:  Procedure Laterality Date  . CATARACT EXTRACTION W/PHACO Right 04/28/2016   Procedure: CATARACT EXTRACTION PHACO AND INTRAOCULAR LENS PLACEMENT (IOC);  Surgeon: Williams Che, MD;  Location: [facility];  Service: Ophthalmology;  Laterality: Right;  CDE: 4.28  . COLONOSCOPY     about 2 years in [place]   . CYSTECTOMY         Home Medications    Prior to Admission medications   Medication Sig Start Date End Date Taking? Authorizing Provider  acetaminophen (TYLENOL) 650 MG CR tablet Take 650 mg by mouth every 8 (eight) hours as needed for pain.    [provider]  albuterol (PROVENTIL HFA;VENTOLIN HFA) 108 (90 Base) MCG/ACT inhaler Inhale 1-2 puffs into the lungs every 6 (six) hours as needed for wheezing or shortness of breath.    [provider]  aspirin 81 MG tablet Take 81 mg by mouth daily.    [provider]  Cholecalciferol (VITAMIN D-3) 1000 UNITS CAPS Take 1 capsule by mouth once a week.     [provider]  diazepam (VALIUM) 5 MG tablet Take 5 mg by mouth 2 (two) times daily as needed (for pelvic pain).    [provider]  docusate sodium (COLACE) 100 MG capsule  Take 100 mg by mouth daily.     [provider]  gabapentin (NEURONTIN) 300 MG capsule Take 1 capsule by mouth 2 (two) times daily. 01/19/15   [provider]  hydrALAZINE (APRESOLINE) 25 MG tablet Take 1 tablet (25 mg total) by mouth 3 (three) times daily. 10/21/17 01/19/18  Herminio Commons, MD  linagliptin (TRADJENTA) 5 MG TABS tablet Take 5 mg by mouth daily.    [provider]  losartan (COZAAR) 100 MG tablet Take 1 tablet by mouth daily. 04/06/13   [provider]    Lysine 500 MG CAPS Take 500 mg by mouth daily.    [provider]  NIFEdipine (PROCARDIA XL/ADALAT-CC) 90 MG 24 hr tablet Take 1 tablet (90 mg total) by mouth daily. 10/16/17   Herminio Commons, MD  omeprazole (PRILOSEC) 20 MG capsule Take 1 capsule by mouth 2 (two) times daily. 04/06/13   [provider]  pravastatin (PRAVACHOL) 40 MG tablet Take 1 tablet by mouth every evening.  04/06/13   [provider]  tamsulosin (FLOMAX) 0.4 MG CAPS Take 1 capsule by mouth 2 (two) times daily.  04/06/13   [provider]    Family History Family History  Problem Relation Age of Onset  . Heart disease Mother        cause of death  . Diabetes Sister   . Colon cancer Neg Hx     Social History Social History   Tobacco Use  . Smoking status: Former Smoker    Packs/day: 2.00    Years: 25.00    Pack years: 50.00    Types: Pipe, Cigars    Start date: 11/08/1954    Last attempt to quit: 10/14/1987    Years since quitting: 30.0  . Smokeless tobacco: Never Used  . Tobacco comment: About 2 pipes a day  Substance Use Topics  . Alcohol use: No    Alcohol/week: 0.0 oz  . Drug use: No     Allergies   Patient has no allergy information on record.   Review of Systems Review of Systems  Constitutional: Negative for fever.  HENT: Positive for tinnitus.   Respiratory: Negative for shortness of breath.   Cardiovascular: Positive for leg swelling. Negative for chest pain.  Gastrointestinal: Negative for abdominal pain.  Neurological: Positive for headaches. Negative for dizziness, weakness and numbness.  All other systems reviewed and are negative.    Physical Exam Updated Vital Signs BP (!) 193/80 (BP Location: Left Arm)   Pulse (!) 102   Temp 97.7 F (36.5 C) (Oral)   Resp 17   Ht 1.651 m (5\' 5" )   Wt 81.6 kg (180 lb)   SpO2 100%   BMI 29.95 kg/m   Physical Exam CONSTITUTIONAL: Well developed/well nourished HEAD: Normocephalic/atraumatic EYES:  EOMI/PERRL, no nystagmus, no ptosis ENMT: Mucous membranes moist NECK: supple no meningeal signs, no bruits SPINE/BACK:entire spine nontender CV: S1/S2 noted, no murmurs/rubs/gallops noted LUNGS: Lungs are clear to auscultation bilaterally, no apparent distress ABDOMEN: soft, nontender, no rebound or guarding GU:no cva tenderness NEURO:Awake/alert, face symmetric, no arm or leg drift is noted Equal 5/5 strength with shoulder abduction, elbow flex/extension, wrist flex/extension in upper extremities and equal hand grips bilaterally Equal 5/5 strength with hip flexion,knee flex/extension, foot dorsi/plantar flexion Cranial nerves 3/4/5/6/04/20/09/11/12 tested and intact Gait normal without ataxia No past pointing Sensation to light touch intact in all extremities EXTREMITIES: pulses normal, full ROM SKIN: warm, color normal PSYCH: no abnormalities of mood noted,  alert and oriented to situation    ED Treatments / Results  Labs (all labs ordered are listed, but only abnormal results are displayed) Labs Reviewed - No data to display  EKG  EKG Interpretation  Date/Time:  Thursday October 22 2017 02:07:01 EST Ventricular Rate:  102 PR Interval:    QRS Duration: 100 QT Interval:  337 QTC Calculation: 439 R Axis:   -50 Text Interpretation:  Sinus tachycardia Probable left atrial enlargement LAD, consider left anterior fascicular block No significant change since last tracing Confirmed by Ripley Fraise [id number]) on 10/22/2017 2:17:03 AM       Radiology No results found.  Procedures Procedures (including critical care time)  Medications Ordered in ED Medications - No data to display   Initial Impression / Assessment and Plan / ED Course  I have reviewed the triage vital signs and the nursing notes.      Patient admits that he only came in because he thought maybe he may be having a stroke with this headache and high blood pressure There were no weakness or numbness  complaints Patient is awake and alert, smiling, he is ambulatory, no focal weakness is noted on my exam He is been having these symptoms on and off for over a month, my suspicion for acute neurologic emergency is low.  I encouraged him to continue his BP medications, he also reports he has follow-up with nephrology later this month who will follow along his kidney function as well as his blood pressure   Final Clinical Impressions(s) / ED Diagnoses   Final diagnoses:  Essential hypertension    ED Discharge Orders    None       Ripley Fraise, MD 10/22/17 [phone]

## 2017-11-03 DIAGNOSIS — E1129 Type 2 diabetes mellitus with other diabetic kidney complication: Secondary | ICD-10-CM | POA: Diagnosis not present

## 2017-11-03 DIAGNOSIS — I509 Heart failure, unspecified: Secondary | ICD-10-CM | POA: Diagnosis not present

## 2017-11-03 DIAGNOSIS — E889 Metabolic disorder, unspecified: Secondary | ICD-10-CM | POA: Diagnosis not present

## 2017-11-03 DIAGNOSIS — M9080 Osteopathy in diseases classified elsewhere, unspecified site: Secondary | ICD-10-CM | POA: Diagnosis not present

## 2017-11-03 DIAGNOSIS — D509 Iron deficiency anemia, unspecified: Secondary | ICD-10-CM | POA: Diagnosis not present

## 2017-11-03 DIAGNOSIS — R809 Proteinuria, unspecified: Secondary | ICD-10-CM | POA: Diagnosis not present

## 2017-11-03 DIAGNOSIS — N183 Chronic kidney disease, stage 3 (moderate): Secondary | ICD-10-CM | POA: Diagnosis not present

## 2017-11-09 DIAGNOSIS — I129 Hypertensive chronic kidney disease with stage 1 through stage 4 chronic kidney disease, or unspecified chronic kidney disease: Secondary | ICD-10-CM | POA: Diagnosis not present

## 2017-11-09 DIAGNOSIS — R809 Proteinuria, unspecified: Secondary | ICD-10-CM | POA: Diagnosis not present

## 2017-11-09 DIAGNOSIS — N183 Chronic kidney disease, stage 3 (moderate): Secondary | ICD-10-CM | POA: Diagnosis not present

## 2017-11-09 DIAGNOSIS — Z79899 Other long term (current) drug therapy: Secondary | ICD-10-CM | POA: Diagnosis not present

## 2017-11-10 DIAGNOSIS — E119 Type 2 diabetes mellitus without complications: Secondary | ICD-10-CM | POA: Diagnosis not present

## 2017-11-10 DIAGNOSIS — Z0100 Encounter for examination of eyes and vision without abnormal findings: Secondary | ICD-10-CM | POA: Diagnosis not present

## 2017-11-10 DIAGNOSIS — E109 Type 1 diabetes mellitus without complications: Secondary | ICD-10-CM | POA: Diagnosis not present

## 2017-11-10 DIAGNOSIS — H25812 Combined forms of age-related cataract, left eye: Secondary | ICD-10-CM | POA: Diagnosis not present

## 2017-11-19 DIAGNOSIS — N184 Chronic kidney disease, stage 4 (severe): Secondary | ICD-10-CM | POA: Diagnosis not present

## 2017-11-19 DIAGNOSIS — J069 Acute upper respiratory infection, unspecified: Secondary | ICD-10-CM | POA: Diagnosis not present

## 2017-11-19 DIAGNOSIS — E1142 Type 2 diabetes mellitus with diabetic polyneuropathy: Secondary | ICD-10-CM | POA: Diagnosis not present

## 2017-12-10 ENCOUNTER — Ambulatory Visit (INDEPENDENT_AMBULATORY_CARE_PROVIDER_SITE_OTHER): Payer: Medicare HMO | Admitting: Otolaryngology

## 2017-12-10 DIAGNOSIS — H903 Sensorineural hearing loss, bilateral: Secondary | ICD-10-CM | POA: Diagnosis not present

## 2017-12-10 DIAGNOSIS — H9313 Tinnitus, bilateral: Secondary | ICD-10-CM | POA: Diagnosis not present

## 2017-12-31 DIAGNOSIS — E669 Obesity, unspecified: Secondary | ICD-10-CM | POA: Diagnosis not present

## 2017-12-31 DIAGNOSIS — N183 Chronic kidney disease, stage 3 (moderate): Secondary | ICD-10-CM | POA: Diagnosis not present

## 2017-12-31 DIAGNOSIS — E1165 Type 2 diabetes mellitus with hyperglycemia: Secondary | ICD-10-CM | POA: Diagnosis not present

## 2018-01-06 DIAGNOSIS — K625 Hemorrhage of anus and rectum: Secondary | ICD-10-CM | POA: Diagnosis not present

## 2018-01-19 ENCOUNTER — Ambulatory Visit: Payer: Medicare HMO | Admitting: Urology

## 2018-01-19 DIAGNOSIS — R102 Pelvic and perineal pain: Secondary | ICD-10-CM

## 2018-01-19 DIAGNOSIS — Z8546 Personal history of malignant neoplasm of prostate: Secondary | ICD-10-CM | POA: Diagnosis not present

## 2018-01-22 DIAGNOSIS — C61 Malignant neoplasm of prostate: Secondary | ICD-10-CM | POA: Diagnosis not present

## 2018-02-15 DIAGNOSIS — N184 Chronic kidney disease, stage 4 (severe): Secondary | ICD-10-CM | POA: Diagnosis not present

## 2018-02-15 DIAGNOSIS — I10 Essential (primary) hypertension: Secondary | ICD-10-CM | POA: Diagnosis not present

## 2018-02-15 DIAGNOSIS — E1142 Type 2 diabetes mellitus with diabetic polyneuropathy: Secondary | ICD-10-CM | POA: Diagnosis not present

## 2018-02-15 DIAGNOSIS — M1990 Unspecified osteoarthritis, unspecified site: Secondary | ICD-10-CM | POA: Diagnosis not present

## 2018-02-17 DIAGNOSIS — K625 Hemorrhage of anus and rectum: Secondary | ICD-10-CM | POA: Diagnosis not present

## 2018-03-06 DIAGNOSIS — I129 Hypertensive chronic kidney disease with stage 1 through stage 4 chronic kidney disease, or unspecified chronic kidney disease: Secondary | ICD-10-CM | POA: Diagnosis not present

## 2018-03-06 DIAGNOSIS — R197 Diarrhea, unspecified: Secondary | ICD-10-CM | POA: Diagnosis not present

## 2018-03-06 DIAGNOSIS — N179 Acute kidney failure, unspecified: Secondary | ICD-10-CM | POA: Diagnosis not present

## 2018-03-06 DIAGNOSIS — K529 Noninfective gastroenteritis and colitis, unspecified: Secondary | ICD-10-CM | POA: Diagnosis not present

## 2018-03-06 DIAGNOSIS — N183 Chronic kidney disease, stage 3 (moderate): Secondary | ICD-10-CM | POA: Diagnosis not present

## 2018-03-06 DIAGNOSIS — R112 Nausea with vomiting, unspecified: Secondary | ICD-10-CM | POA: Diagnosis not present

## 2018-03-06 DIAGNOSIS — E875 Hyperkalemia: Secondary | ICD-10-CM | POA: Diagnosis not present

## 2018-03-06 DIAGNOSIS — E1122 Type 2 diabetes mellitus with diabetic chronic kidney disease: Secondary | ICD-10-CM | POA: Diagnosis not present

## 2018-03-06 DIAGNOSIS — I10 Essential (primary) hypertension: Secondary | ICD-10-CM | POA: Diagnosis not present

## 2018-03-06 DIAGNOSIS — K5900 Constipation, unspecified: Secondary | ICD-10-CM | POA: Diagnosis not present

## 2018-03-06 DIAGNOSIS — N184 Chronic kidney disease, stage 4 (severe): Secondary | ICD-10-CM | POA: Diagnosis not present

## 2018-03-06 DIAGNOSIS — Z8546 Personal history of malignant neoplasm of prostate: Secondary | ICD-10-CM | POA: Diagnosis not present

## 2018-03-06 DIAGNOSIS — E860 Dehydration: Secondary | ICD-10-CM | POA: Diagnosis not present

## 2018-03-06 DIAGNOSIS — E119 Type 2 diabetes mellitus without complications: Secondary | ICD-10-CM | POA: Diagnosis not present

## 2018-03-06 DIAGNOSIS — Z923 Personal history of irradiation: Secondary | ICD-10-CM | POA: Diagnosis not present

## 2018-03-06 DIAGNOSIS — N189 Chronic kidney disease, unspecified: Secondary | ICD-10-CM | POA: Diagnosis not present

## 2018-03-23 DIAGNOSIS — I10 Essential (primary) hypertension: Secondary | ICD-10-CM | POA: Diagnosis not present

## 2018-03-23 DIAGNOSIS — N342 Other urethritis: Secondary | ICD-10-CM | POA: Diagnosis not present

## 2018-03-23 DIAGNOSIS — N184 Chronic kidney disease, stage 4 (severe): Secondary | ICD-10-CM | POA: Diagnosis not present

## 2018-03-23 DIAGNOSIS — E1142 Type 2 diabetes mellitus with diabetic polyneuropathy: Secondary | ICD-10-CM | POA: Diagnosis not present

## 2018-03-23 DIAGNOSIS — E119 Type 2 diabetes mellitus without complications: Secondary | ICD-10-CM | POA: Diagnosis not present

## 2018-03-25 DIAGNOSIS — R079 Chest pain, unspecified: Secondary | ICD-10-CM | POA: Diagnosis not present

## 2018-03-25 DIAGNOSIS — K859 Acute pancreatitis without necrosis or infection, unspecified: Secondary | ICD-10-CM | POA: Diagnosis not present

## 2018-03-25 DIAGNOSIS — Z202 Contact with and (suspected) exposure to infections with a predominantly sexual mode of transmission: Secondary | ICD-10-CM | POA: Diagnosis not present

## 2018-03-25 DIAGNOSIS — I700 Atherosclerosis of aorta: Secondary | ICD-10-CM | POA: Diagnosis not present

## 2018-03-25 DIAGNOSIS — I10 Essential (primary) hypertension: Secondary | ICD-10-CM | POA: Diagnosis not present

## 2018-03-25 DIAGNOSIS — R0789 Other chest pain: Secondary | ICD-10-CM | POA: Diagnosis not present

## 2018-03-25 DIAGNOSIS — E1140 Type 2 diabetes mellitus with diabetic neuropathy, unspecified: Secondary | ICD-10-CM | POA: Diagnosis not present

## 2018-03-25 DIAGNOSIS — R51 Headache: Secondary | ICD-10-CM | POA: Diagnosis not present

## 2018-03-25 DIAGNOSIS — R1013 Epigastric pain: Secondary | ICD-10-CM | POA: Diagnosis not present

## 2018-03-25 DIAGNOSIS — N289 Disorder of kidney and ureter, unspecified: Secondary | ICD-10-CM | POA: Diagnosis not present

## 2018-03-25 DIAGNOSIS — R748 Abnormal levels of other serum enzymes: Secondary | ICD-10-CM | POA: Diagnosis not present

## 2018-03-25 DIAGNOSIS — M1990 Unspecified osteoarthritis, unspecified site: Secondary | ICD-10-CM | POA: Diagnosis not present

## 2018-03-25 DIAGNOSIS — R000 Tachycardia, unspecified: Secondary | ICD-10-CM | POA: Diagnosis not present

## 2018-03-30 DIAGNOSIS — R809 Proteinuria, unspecified: Secondary | ICD-10-CM | POA: Diagnosis not present

## 2018-03-30 DIAGNOSIS — I509 Heart failure, unspecified: Secondary | ICD-10-CM | POA: Diagnosis not present

## 2018-03-30 DIAGNOSIS — E1129 Type 2 diabetes mellitus with other diabetic kidney complication: Secondary | ICD-10-CM | POA: Diagnosis not present

## 2018-03-30 DIAGNOSIS — I10 Essential (primary) hypertension: Secondary | ICD-10-CM | POA: Diagnosis not present

## 2018-03-30 DIAGNOSIS — N183 Chronic kidney disease, stage 3 (moderate): Secondary | ICD-10-CM | POA: Diagnosis not present

## 2018-04-13 DIAGNOSIS — N183 Chronic kidney disease, stage 3 (moderate): Secondary | ICD-10-CM | POA: Diagnosis not present

## 2018-04-13 DIAGNOSIS — I129 Hypertensive chronic kidney disease with stage 1 through stage 4 chronic kidney disease, or unspecified chronic kidney disease: Secondary | ICD-10-CM | POA: Diagnosis not present

## 2018-04-13 DIAGNOSIS — Z79899 Other long term (current) drug therapy: Secondary | ICD-10-CM | POA: Diagnosis not present

## 2018-04-30 DIAGNOSIS — N183 Chronic kidney disease, stage 3 (moderate): Secondary | ICD-10-CM | POA: Diagnosis not present

## 2018-04-30 DIAGNOSIS — E1122 Type 2 diabetes mellitus with diabetic chronic kidney disease: Secondary | ICD-10-CM | POA: Diagnosis not present

## 2018-04-30 DIAGNOSIS — Z8546 Personal history of malignant neoplasm of prostate: Secondary | ICD-10-CM | POA: Diagnosis not present

## 2018-04-30 DIAGNOSIS — M1990 Unspecified osteoarthritis, unspecified site: Secondary | ICD-10-CM | POA: Diagnosis not present

## 2018-04-30 DIAGNOSIS — E7800 Pure hypercholesterolemia, unspecified: Secondary | ICD-10-CM | POA: Diagnosis not present

## 2018-04-30 DIAGNOSIS — J209 Acute bronchitis, unspecified: Secondary | ICD-10-CM | POA: Diagnosis not present

## 2018-04-30 DIAGNOSIS — R0789 Other chest pain: Secondary | ICD-10-CM | POA: Diagnosis not present

## 2018-04-30 DIAGNOSIS — E1140 Type 2 diabetes mellitus with diabetic neuropathy, unspecified: Secondary | ICD-10-CM | POA: Diagnosis not present

## 2018-04-30 DIAGNOSIS — I129 Hypertensive chronic kidney disease with stage 1 through stage 4 chronic kidney disease, or unspecified chronic kidney disease: Secondary | ICD-10-CM | POA: Diagnosis not present

## 2018-04-30 DIAGNOSIS — I10 Essential (primary) hypertension: Secondary | ICD-10-CM | POA: Diagnosis not present

## 2018-04-30 DIAGNOSIS — Z87891 Personal history of nicotine dependence: Secondary | ICD-10-CM | POA: Diagnosis not present

## 2018-04-30 DIAGNOSIS — N400 Enlarged prostate without lower urinary tract symptoms: Secondary | ICD-10-CM | POA: Diagnosis not present

## 2018-04-30 DIAGNOSIS — E1142 Type 2 diabetes mellitus with diabetic polyneuropathy: Secondary | ICD-10-CM | POA: Diagnosis not present

## 2018-04-30 DIAGNOSIS — N184 Chronic kidney disease, stage 4 (severe): Secondary | ICD-10-CM | POA: Diagnosis not present

## 2018-05-10 DIAGNOSIS — C61 Malignant neoplasm of prostate: Secondary | ICD-10-CM | POA: Diagnosis not present

## 2018-05-10 DIAGNOSIS — E119 Type 2 diabetes mellitus without complications: Secondary | ICD-10-CM | POA: Diagnosis not present

## 2018-05-10 DIAGNOSIS — N184 Chronic kidney disease, stage 4 (severe): Secondary | ICD-10-CM | POA: Diagnosis not present

## 2018-05-10 DIAGNOSIS — Z1331 Encounter for screening for depression: Secondary | ICD-10-CM | POA: Diagnosis not present

## 2018-05-10 DIAGNOSIS — N342 Other urethritis: Secondary | ICD-10-CM | POA: Diagnosis not present

## 2018-05-10 DIAGNOSIS — Z1389 Encounter for screening for other disorder: Secondary | ICD-10-CM | POA: Diagnosis not present

## 2018-05-10 DIAGNOSIS — E1142 Type 2 diabetes mellitus with diabetic polyneuropathy: Secondary | ICD-10-CM | POA: Diagnosis not present

## 2018-05-10 DIAGNOSIS — Z0001 Encounter for general adult medical examination with abnormal findings: Secondary | ICD-10-CM | POA: Diagnosis not present

## 2018-05-10 DIAGNOSIS — Z0000 Encounter for general adult medical examination without abnormal findings: Secondary | ICD-10-CM | POA: Diagnosis not present

## 2018-05-10 DIAGNOSIS — I10 Essential (primary) hypertension: Secondary | ICD-10-CM | POA: Diagnosis not present

## 2018-05-10 DIAGNOSIS — M109 Gout, unspecified: Secondary | ICD-10-CM | POA: Diagnosis not present

## 2018-05-11 ENCOUNTER — Emergency Department (HOSPITAL_COMMUNITY)
Admission: EM | Admit: 2018-05-11 | Discharge: 2018-05-11 | Disposition: A | Discharge status: Home / Self Care | Payer: Medicare HMO | Attending: Emergency Medicine | Admitting: Emergency Medicine

## 2018-05-11 ENCOUNTER — Emergency Department (HOSPITAL_COMMUNITY): Payer: Medicare HMO

## 2018-05-11 ENCOUNTER — Encounter (HOSPITAL_COMMUNITY): Payer: Self-pay | Admitting: Emergency Medicine

## 2018-05-11 ENCOUNTER — Other Ambulatory Visit: Payer: Self-pay

## 2018-05-11 DIAGNOSIS — Z87891 Personal history of nicotine dependence: Secondary | ICD-10-CM | POA: Insufficient documentation

## 2018-05-11 DIAGNOSIS — R0789 Other chest pain: Secondary | ICD-10-CM | POA: Diagnosis not present

## 2018-05-11 DIAGNOSIS — I129 Hypertensive chronic kidney disease with stage 1 through stage 4 chronic kidney disease, or unspecified chronic kidney disease: Secondary | ICD-10-CM | POA: Insufficient documentation

## 2018-05-11 DIAGNOSIS — Z79899 Other long term (current) drug therapy: Secondary | ICD-10-CM | POA: Insufficient documentation

## 2018-05-11 DIAGNOSIS — E1122 Type 2 diabetes mellitus with diabetic chronic kidney disease: Secondary | ICD-10-CM | POA: Insufficient documentation

## 2018-05-11 DIAGNOSIS — R519 Headache, unspecified: Secondary | ICD-10-CM

## 2018-05-11 DIAGNOSIS — R51 Headache: Secondary | ICD-10-CM | POA: Diagnosis not present

## 2018-05-11 DIAGNOSIS — Z8546 Personal history of malignant neoplasm of prostate: Secondary | ICD-10-CM | POA: Insufficient documentation

## 2018-05-11 DIAGNOSIS — Z7984 Long term (current) use of oral hypoglycemic drugs: Secondary | ICD-10-CM | POA: Insufficient documentation

## 2018-05-11 DIAGNOSIS — R079 Chest pain, unspecified: Secondary | ICD-10-CM | POA: Diagnosis not present

## 2018-05-11 DIAGNOSIS — Z7982 Long term (current) use of aspirin: Secondary | ICD-10-CM | POA: Insufficient documentation

## 2018-05-11 DIAGNOSIS — R0600 Dyspnea, unspecified: Secondary | ICD-10-CM | POA: Diagnosis present

## 2018-05-11 DIAGNOSIS — E875 Hyperkalemia: Secondary | ICD-10-CM | POA: Diagnosis not present

## 2018-05-11 DIAGNOSIS — N183 Chronic kidney disease, stage 3 (moderate): Secondary | ICD-10-CM | POA: Insufficient documentation

## 2018-05-11 LAB — I-STAT CHEM 8, ED
BUN: 28 mg/dL — ABNORMAL HIGH (ref 8–23)
Calcium, Ion: 1.18 mmol/L (ref 1.15–1.40)
Chloride: 108 mmol/L (ref 98–111)
Creatinine, Ser: 2.6 mg/dL — ABNORMAL HIGH (ref 0.61–1.24)
Glucose, Bld: 108 mg/dL — ABNORMAL HIGH (ref 70–99)
HCT: 32 % — ABNORMAL LOW (ref 39.0–52.0)
Hemoglobin: 10.9 g/dL — ABNORMAL LOW (ref 13.0–17.0)
Potassium: 4.3 mmol/L (ref 3.5–5.1)
Sodium: 142 mmol/L (ref 135–145)
TCO2: 21 mmol/L — ABNORMAL LOW (ref 22–32)

## 2018-05-11 LAB — URINALYSIS, ROUTINE W REFLEX MICROSCOPIC
Bacteria, UA: NONE SEEN
Bilirubin Urine: NEGATIVE
Glucose, UA: 50 mg/dL — AB
Hgb urine dipstick: NEGATIVE
Ketones, ur: NEGATIVE mg/dL
Leukocytes, UA: NEGATIVE
Nitrite: NEGATIVE
Protein, ur: 100 mg/dL — AB
Specific Gravity, Urine: 1.004 — ABNORMAL LOW (ref 1.005–1.030)
pH: 6 (ref 5.0–8.0)

## 2018-05-11 LAB — CBC WITH DIFFERENTIAL/PLATELET
Basophils Absolute: 0 10*3/uL (ref 0.0–0.1)
Basophils Relative: 0 %
Eosinophils Absolute: 0.1 10*3/uL (ref 0.0–0.7)
Eosinophils Relative: 2 %
HCT: 32.1 % — ABNORMAL LOW (ref 39.0–52.0)
Hemoglobin: 10.6 g/dL — ABNORMAL LOW (ref 13.0–17.0)
Lymphocytes Relative: 17 %
Lymphs Abs: 1 10*3/uL (ref 0.7–4.0)
MCH: 26 pg (ref 26.0–34.0)
MCHC: 33 g/dL (ref 30.0–36.0)
MCV: 78.9 fL (ref 78.0–100.0)
Monocytes Absolute: 0.7 10*3/uL (ref 0.1–1.0)
Monocytes Relative: 12 %
Neutro Abs: 4 10*3/uL (ref 1.7–7.7)
Neutrophils Relative %: 69 %
Platelets: 186 10*3/uL (ref 150–400)
RBC: 4.07 MIL/uL — ABNORMAL LOW (ref 4.22–5.81)
RDW: 13.8 % (ref 11.5–15.5)
WBC: 5.9 10*3/uL (ref 4.0–10.5)

## 2018-05-11 LAB — COMPREHENSIVE METABOLIC PANEL
ALT: 28 U/L (ref 0–44)
AST: 41 U/L (ref 15–41)
Albumin: 3.6 g/dL (ref 3.5–5.0)
Alkaline Phosphatase: 74 U/L (ref 38–126)
Anion gap: 10 (ref 5–15)
BUN: 31 mg/dL — ABNORMAL HIGH (ref 8–23)
CO2: 20 mmol/L — ABNORMAL LOW (ref 22–32)
Calcium: 8.8 mg/dL — ABNORMAL LOW (ref 8.9–10.3)
Chloride: 103 mmol/L (ref 98–111)
Creatinine, Ser: 2.78 mg/dL — ABNORMAL HIGH (ref 0.61–1.24)
GFR calc Af Amer: 23 mL/min — ABNORMAL LOW (ref >60)
GFR calc non Af Amer: 20 mL/min — ABNORMAL LOW (ref >60)
Glucose, Bld: 217 mg/dL — ABNORMAL HIGH (ref 70–99)
Potassium: 6 mmol/L — ABNORMAL HIGH (ref 3.5–5.1)
Sodium: 133 mmol/L — ABNORMAL LOW (ref 135–145)
Total Bilirubin: 1 mg/dL (ref 0.3–1.2)
Total Protein: 7.3 g/dL (ref 6.5–8.1)

## 2018-05-11 LAB — TROPONIN I: Troponin I: <0.03 ng/mL (ref <0.03)

## 2018-05-11 LAB — CBG MONITORING, ED: Glucose-Capillary: 206 mg/dL — ABNORMAL HIGH (ref 70–99)

## 2018-05-11 LAB — MAGNESIUM: Magnesium: 2.6 mg/dL — ABNORMAL HIGH (ref 1.7–2.4)

## 2018-05-11 LAB — BRAIN NATRIURETIC PEPTIDE: B Natriuretic Peptide: 91 pg/mL (ref 0.0–100.0)

## 2018-05-11 MED ORDER — INSULIN ASPART 100 UNIT/ML IV SOLN
5.0000 [IU] | Freq: Once | INTRAVENOUS | Status: AC
  Administered 2018-05-11: 5 [IU] via INTRAVENOUS

## 2018-05-11 MED ORDER — ALBUTEROL SULFATE HFA 108 (90 BASE) MCG/ACT IN AERS
2.0000 | INHALATION_SPRAY | Freq: Four times a day (QID) | RESPIRATORY_TRACT | Status: DC
  Administered 2018-05-11: 2 via RESPIRATORY_TRACT
  Filled 2018-05-11 (×2): qty 6.7

## 2018-05-11 MED ORDER — DEXTROSE 50 % IV SOLN
1.0000 | Freq: Once | INTRAVENOUS | Status: AC
  Administered 2018-05-11: 50 mL via INTRAVENOUS

## 2018-05-11 MED ORDER — SODIUM BICARBONATE 8.4 % IV SOLN
50.0000 meq | Freq: Once | INTRAVENOUS | Status: AC
  Administered 2018-05-11: 50 meq via INTRAVENOUS
  Filled 2018-05-11: qty 50

## 2018-05-11 MED ORDER — DEXTROSE 50 % IV SOLN
INTRAVENOUS | Status: AC
  Filled 2018-05-11: qty 50

## 2018-05-11 MED ORDER — SODIUM CHLORIDE 0.9 % IV BOLUS
500.0000 mL | Freq: Once | INTRAVENOUS | Status: AC
  Administered 2018-05-11: 500 mL via INTRAVENOUS

## 2018-05-11 NOTE — ED Triage Notes (Signed)
PT states he was seen at his PCP office yesterday for SOB and prescribed an inhaler. PT states he went back to the office today due to chest pain and high heart rate and was told to come to ED for eval. PT states center chest tightness x1 week.

## 2018-05-11 NOTE — Progress Notes (Signed)
**Note De-Identified Steve [name] Obfuscation** Airflow measurement for MDI WNL

## 2018-05-11 NOTE — Discharge Instructions (Signed)
As discussed, your evaluation today has been largely reassuring.  But, it is important that you monitor your condition carefully, and do not hesitate to return to the ED if you develop new, or concerning changes in your condition.  Please drink plenty of fluids.  Otherwise, please follow-up with your physician for appropriate ongoing care.

## 2018-05-11 NOTE — ED Provider Notes (Signed)
[facility] EMERGENCY DEPARTMENT Provider Note   CSN: [id number] Arrival date & time: 05/11/18  1022     History   Chief Complaint Chief Complaint  Patient presents with  . Chest Pain    HPI Steve [name] is a 82 y.o. male.  HPI Presents with concern of dyspnea, chest tightness, headache. Patient has multiple medical issues including prior cancer, and known CKD. He notes that over the past few weeks he has had some dyspnea, fatigue, and palpitations. He saw his physician yesterday, none after recurrence overnight went back to his doctor's office today and was sent here for evaluation. He denies specific pain in the chest, acknowledges headache, sensation of palpitations, dyspnea, without wheezing, without cough, without fever, nausea, vomiting, anorexia, diarrhea. No recent medication change, diet change.  When he denies a history of pulmonary disease. Patient continues to work episodically, as a Administrator.  Past Medical History:  Diagnosis Date  . Arthritis   . CKD (chronic kidney disease) stage 3, GFR 30-59 ml/min (HCC)   . Essential hypertension   . Prostate cancer ([place])   . Type 2 diabetes mellitus [facility])     Patient Active Problem List   Diagnosis Date Noted  . Elevated troponin 07/02/2017  . DM (diabetes mellitus), type 2 with renal complications ([place]) 43/88/8757  . IBS (irritable bowel syndrome) 01/28/2017  . Prostate cancer ([place]) 07/10/2014  . Abnormal ECG 04/12/2013  . HTN (hypertension) 04/12/2013  . SOB (shortness of breath) 04/12/2013  . CHEST PAIN-UNSPECIFIED 06/12/2009    Past Surgical History:  Procedure Laterality Date  . CATARACT EXTRACTION W/PHACO Right 04/28/2016   Procedure: CATARACT EXTRACTION PHACO AND INTRAOCULAR LENS PLACEMENT (IOC);  Surgeon: Williams Che, MD;  Location: [facility];  Service: Ophthalmology;  Laterality: Right;  CDE: 4.28  . COLONOSCOPY     about 2 years in [place]   . CYSTECTOMY          Home Medications    Prior  to Admission medications   Medication Sig Start Date End Date Taking? Authorizing Provider  acetaminophen (TYLENOL) 650 MG CR tablet Take 650 mg by mouth every 8 (eight) hours as needed for pain.   Yes [provider]  albuterol (PROVENTIL HFA;VENTOLIN HFA) 108 (90 Base) MCG/ACT inhaler Inhale 1-2 puffs into the lungs every 6 (six) hours as needed for wheezing or shortness of breath.   Yes [provider]  aspirin 81 MG tablet Take 81 mg by mouth daily.   Yes [provider]  Cholecalciferol (VITAMIN D-3) 1000 UNITS CAPS Take 1 capsule by mouth once a week.    Yes [provider]  diazepam (VALIUM) 5 MG tablet Take 5 mg by mouth 2 (two) times daily as needed (for pelvic pain).   Yes [provider]  docusate sodium (COLACE) 100 MG capsule Take 100 mg by mouth daily.    Yes [provider]  gabapentin (NEURONTIN) 300 MG capsule Take 1 capsule by mouth 2 (two) times daily. 01/19/15  Yes [provider]  hydrALAZINE (APRESOLINE) 25 MG tablet Take 1 tablet (25 mg total) by mouth 3 (three) times daily. Patient taking differently: Take 50 mg by mouth 3 (three) times daily.  10/21/17 05/11/18 Yes Herminio Commons, MD  linagliptin (TRADJENTA) 5 MG TABS tablet Take 5 mg by mouth daily.   Yes [provider]  Lysine 500 MG CAPS Take 500 mg by mouth daily.   Yes [provider]  NIFEdipine (PROCARDIA XL/ADALAT-CC) 90 MG 24  hr tablet Take 1 tablet (90 mg total) by mouth daily. 10/16/17  Yes Herminio Commons, MD  omeprazole (PRILOSEC) 20 MG capsule Take 1 capsule by mouth 2 (two) times daily. 04/06/13  Yes [provider]  pravastatin (PRAVACHOL) 40 MG tablet Take 1 tablet by mouth every evening.  04/06/13  Yes [provider]  tamsulosin (FLOMAX) 0.4 MG CAPS Take 1 capsule by mouth 2 (two) times daily.  04/06/13  Yes [provider]    Family History Family History  Problem Relation Age of Onset  .  Heart disease Mother        cause of death  . Diabetes Sister   . Colon cancer Neg Hx     Social History Social History   Tobacco Use  . Smoking status: Former Smoker    Packs/day: 2.00    Years: 25.00    Pack years: 50.00    Types: Pipe, Cigars    Start date: 11/08/1954    Last attempt to quit: 10/14/1987    Years since quitting: 30.5  . Smokeless tobacco: Never Used  . Tobacco comment: About 2 pipes a day  Substance Use Topics  . Alcohol use: No    Alcohol/week: 0.0 oz  . Drug use: No     Allergies   Patient has no known allergies.   Review of Systems Review of Systems  Constitutional:       Per HPI, otherwise negative  HENT:       Per HPI, otherwise negative  Respiratory:       Per HPI, otherwise negative  Cardiovascular:       Per HPI, otherwise negative  Gastrointestinal: Negative for vomiting.  Endocrine:       Negative aside from HPI  Genitourinary:       Neg aside from HPI   Musculoskeletal:       Per HPI, otherwise negative  Skin: Negative.   Neurological: Negative for syncope.     Physical Exam Updated Vital Signs BP (!) 163/76   Pulse 90   Temp 97.6 F (36.4 C) (Oral)   Resp 13   Ht 5\' 5"  (1.651 m)   Wt 83.9 kg (185 lb)   SpO2 97%   BMI 30.79 kg/m   Physical Exam  Constitutional: He is oriented to person, place, and time. He appears well-developed. No distress.  HENT:  Head: Normocephalic and atraumatic.  Eyes: Conjunctivae and EOM are normal.  Neck: Hepatojugular reflux: .now.  Cardiovascular: Regular rhythm. Tachycardia present.  Pulmonary/Chest: Effort normal. No stridor. No respiratory distress.  Abdominal: He exhibits no distension.  Musculoskeletal: He exhibits no edema.  Neurological: He is alert and oriented to person, place, and time.  Skin: Skin is warm and dry.  Psychiatric: He has a normal mood and affect.  Nursing note and vitals reviewed.    ED Treatments / Results  Labs (all labs ordered are listed, but only  abnormal results are displayed) Labs Reviewed  CBC WITH DIFFERENTIAL/PLATELET - Abnormal; Notable for the following components:      Result Value   RBC 4.07 (*)    Hemoglobin 10.6 (*)    HCT 32.1 (*)    All other components within normal limits  COMPREHENSIVE METABOLIC PANEL - Abnormal; Notable for the following components:   Sodium 133 (*)    Potassium 6.0 (*)    CO2 20 (*)    Glucose, Bld 217 (*)    BUN 31 (*)    Creatinine, Ser 2.78 (*)  Calcium 8.8 (*)    GFR calc non Af Amer 20 (*)    GFR calc Af Amer 23 (*)    All other components within normal limits  MAGNESIUM - Abnormal; Notable for the following components:   Magnesium 2.6 (*)    All other components within normal limits  URINALYSIS, ROUTINE W REFLEX MICROSCOPIC - Abnormal; Notable for the following components:   Color, Urine STRAW (*)    Specific Gravity, Urine 1.004 (*)    Glucose, UA 50 (*)    Protein, ur 100 (*)    All other components within normal limits  CBG MONITORING, ED - Abnormal; Notable for the following components:   Glucose-Capillary 206 (*)    All other components within normal limits  I-STAT CHEM 8, ED - Abnormal; Notable for the following components:   BUN 28 (*)    Creatinine, Ser 2.60 (*)    Glucose, Bld 108 (*)    TCO2 21 (*)    Hemoglobin 10.9 (*)    HCT 32.0 (*)    All other components within normal limits  TROPONIN I  BRAIN NATRIURETIC PEPTIDE    EKG EKG Interpretation  Date/Time:  Tuesday May 11 2018 10:35:31 EDT Ventricular Rate:  105 PR Interval:    QRS Duration: 99 QT Interval:  317 QTC Calculation: 419 R Axis:   -36 Text Interpretation:  Sinus tachycardia Ventricular premature complex Probable left atrial enlargement Left axis deviation Borderline repolarization abnormality T wave abnormality Abnormal ekg Confirmed by Carmin Muskrat [phone]) on 05/11/2018 11:02:38 AM   Radiology Dg Chest 2 View  Result Date: 05/11/2018 CLINICAL DATA:  Chest pain. EXAM: CHEST - 2 VIEW  COMPARISON:  Radiographs of April 30, 2018. FINDINGS: The heart size and mediastinal contours are within normal limits. Both lungs are clear. No pneumothorax or pleural effusion is noted. The visualized skeletal structures are unremarkable. IMPRESSION: No active cardiopulmonary disease. Electronically Signed   By: Marijo Conception, M.D.   On: 05/11/2018 10:54    Procedures Procedures (including critical care time)  Medications Ordered in ED Medications  albuterol (PROVENTIL HFA;VENTOLIN HFA) 108 (90 Base) MCG/ACT inhaler 2 puff (2 puffs Inhalation Given 05/11/18 1336)  sodium chloride 0.9 % bolus 500 mL (0 mLs Intravenous Stopped 05/11/18 1334)  insulin aspart (novoLOG) injection 5 Units (5 Units Intravenous Given 05/11/18 1218)  dextrose 50 % solution 50 mL (50 mLs Intravenous Given 05/11/18 1221)  sodium bicarbonate injection 50 mEq (50 mEq Intravenous Given 05/11/18 1219)     Initial Impression / Assessment and Plan / ED Course  I have reviewed the triage vital signs and the nursing notes.  Pertinent labs & imaging results that were available during my care of the patient were reviewed by me and considered in my medical decision making (see chart for details).   Labs notable for hyperkalemia, 6. Patient also found to have persistent renal dysfunction.   Concern for the patient's hyperkalemia, he received albuterol, insulin, dextrose, bicarbonate.  2:40 PM Awake and alert, states that his headache is entirely resolved, he feels better, no ongoing pain. Heart rate has diminished. Repeat labs notable for potassium of 4.3, and given his improvement, absence of ongoing symptoms, patient is appropriate for discharge with nephrology follow-up, primary care follow-up. Patient has an appointment in 1 week with nephrology, will schedule and with primary care as well. Some suspicion for the patient's chronic kidney disease progressive, though with dehydration considered as well given his worsening  function. Given improvement after his  critically abnormal potassium value was found, he was discharged in stable condition.  Final Clinical Impressions(s) / ED Diagnoses  Hyperkalemia Chest pain Dyspnea   CRITICAL CARE Performed by: Carmin Muskrat Total critical care time: 35 minutes Critical care time was exclusive of separately billable procedures and treating other patients. Critical care was necessary to treat or prevent imminent or life-threatening deterioration. Critical care was time spent personally by me on the following activities: development of treatment plan with patient and/or surrogate as well as nursing, discussions with consultants, evaluation of patient's response to treatment, examination of patient, obtaining history from patient or surrogate, ordering and performing treatments and interventions, ordering and review of laboratory studies, ordering and review of radiographic studies, pulse oximetry and re-evaluation of patient's condition.    Carmin Muskrat, MD 05/11/18 [phone]

## 2018-05-12 ENCOUNTER — Observation Stay (HOSPITAL_COMMUNITY)
Admission: EM | Admit: 2018-05-12 | Discharge: 2018-05-13 | Disposition: A | Discharge status: Home / Self Care | Payer: Medicare HMO | Attending: Internal Medicine | Admitting: Internal Medicine

## 2018-05-12 ENCOUNTER — Emergency Department (HOSPITAL_COMMUNITY): Payer: Medicare HMO

## 2018-05-12 ENCOUNTER — Other Ambulatory Visit: Payer: Self-pay

## 2018-05-12 ENCOUNTER — Telehealth (INDEPENDENT_AMBULATORY_CARE_PROVIDER_SITE_OTHER): Payer: Medicare HMO | Admitting: Cardiovascular Disease

## 2018-05-12 ENCOUNTER — Encounter (HOSPITAL_COMMUNITY): Payer: Self-pay

## 2018-05-12 DIAGNOSIS — C61 Malignant neoplasm of prostate: Secondary | ICD-10-CM | POA: Diagnosis not present

## 2018-05-12 DIAGNOSIS — R0602 Shortness of breath: Secondary | ICD-10-CM | POA: Diagnosis not present

## 2018-05-12 DIAGNOSIS — R079 Chest pain, unspecified: Secondary | ICD-10-CM | POA: Diagnosis not present

## 2018-05-12 DIAGNOSIS — E1169 Type 2 diabetes mellitus with other specified complication: Secondary | ICD-10-CM | POA: Diagnosis present

## 2018-05-12 DIAGNOSIS — D649 Anemia, unspecified: Secondary | ICD-10-CM | POA: Diagnosis not present

## 2018-05-12 DIAGNOSIS — I129 Hypertensive chronic kidney disease with stage 1 through stage 4 chronic kidney disease, or unspecified chronic kidney disease: Secondary | ICD-10-CM | POA: Diagnosis not present

## 2018-05-12 DIAGNOSIS — E1129 Type 2 diabetes mellitus with other diabetic kidney complication: Secondary | ICD-10-CM | POA: Diagnosis present

## 2018-05-12 DIAGNOSIS — R0789 Other chest pain: Secondary | ICD-10-CM | POA: Diagnosis not present

## 2018-05-12 DIAGNOSIS — E1122 Type 2 diabetes mellitus with diabetic chronic kidney disease: Secondary | ICD-10-CM | POA: Insufficient documentation

## 2018-05-12 DIAGNOSIS — Z79899 Other long term (current) drug therapy: Secondary | ICD-10-CM | POA: Diagnosis not present

## 2018-05-12 DIAGNOSIS — Z87891 Personal history of nicotine dependence: Secondary | ICD-10-CM | POA: Insufficient documentation

## 2018-05-12 DIAGNOSIS — N189 Chronic kidney disease, unspecified: Secondary | ICD-10-CM | POA: Diagnosis not present

## 2018-05-12 DIAGNOSIS — N184 Chronic kidney disease, stage 4 (severe): Secondary | ICD-10-CM | POA: Diagnosis present

## 2018-05-12 DIAGNOSIS — N183 Chronic kidney disease, stage 3 (moderate): Secondary | ICD-10-CM | POA: Insufficient documentation

## 2018-05-12 DIAGNOSIS — I10 Essential (primary) hypertension: Secondary | ICD-10-CM | POA: Diagnosis present

## 2018-05-12 DIAGNOSIS — Z7984 Long term (current) use of oral hypoglycemic drugs: Secondary | ICD-10-CM | POA: Diagnosis not present

## 2018-05-12 DIAGNOSIS — Z7982 Long term (current) use of aspirin: Secondary | ICD-10-CM | POA: Diagnosis not present

## 2018-05-12 DIAGNOSIS — D631 Anemia in chronic kidney disease: Secondary | ICD-10-CM | POA: Diagnosis not present

## 2018-05-12 HISTORY — DX: Disorder of kidney and ureter, unspecified: N28.9

## 2018-05-12 HISTORY — DX: Chronic kidney disease, stage 4 (severe): N18.4

## 2018-05-12 LAB — CBC
HCT: 31.7 % — ABNORMAL LOW (ref 39.0–52.0)
Hemoglobin: 10.7 g/dL — ABNORMAL LOW (ref 13.0–17.0)
MCH: 26.6 pg (ref 26.0–34.0)
MCHC: 33.8 g/dL (ref 30.0–36.0)
MCV: 78.7 fL (ref 78.0–100.0)
Platelets: 189 10*3/uL (ref 150–400)
RBC: 4.03 MIL/uL — ABNORMAL LOW (ref 4.22–5.81)
RDW: 13.9 % (ref 11.5–15.5)
WBC: 6.6 10*3/uL (ref 4.0–10.5)

## 2018-05-12 LAB — BASIC METABOLIC PANEL
Anion gap: 8 (ref 5–15)
BUN: 28 mg/dL — ABNORMAL HIGH (ref 8–23)
CO2: 20 mmol/L — ABNORMAL LOW (ref 22–32)
Calcium: 9 mg/dL (ref 8.9–10.3)
Chloride: 109 mmol/L (ref 98–111)
Creatinine, Ser: 2.78 mg/dL — ABNORMAL HIGH (ref 0.61–1.24)
GFR calc Af Amer: 23 mL/min — ABNORMAL LOW (ref >60)
GFR calc non Af Amer: 20 mL/min — ABNORMAL LOW (ref >60)
Glucose, Bld: 138 mg/dL — ABNORMAL HIGH (ref 70–99)
Potassium: 4.8 mmol/L (ref 3.5–5.1)
Sodium: 137 mmol/L (ref 135–145)

## 2018-05-12 LAB — TROPONIN I
Troponin I: 0.03 ng/mL (ref <0.03)
Troponin I: 0.03 ng/mL (ref <0.03)

## 2018-05-12 LAB — GLUCOSE, CAPILLARY: Glucose-Capillary: 172 mg/dL — ABNORMAL HIGH (ref 70–99)

## 2018-05-12 MED ORDER — DOCUSATE SODIUM 100 MG PO CAPS
100.0000 mg | ORAL_CAPSULE | Freq: Every day | ORAL | Status: DC
  Administered 2018-05-13: 100 mg via ORAL
  Filled 2018-05-12: qty 1

## 2018-05-12 MED ORDER — ACETAMINOPHEN 325 MG PO TABS: 650.0000 mg | ORAL_TABLET | Freq: Four times a day (QID) | ORAL | Status: DC | PRN

## 2018-05-12 MED ORDER — MORPHINE SULFATE (PF) 4 MG/ML IV SOLN: 4.0000 mg | INTRAVENOUS | Status: DC | PRN

## 2018-05-12 MED ORDER — ASPIRIN 81 MG PO CHEW
324.0000 mg | CHEWABLE_TABLET | Freq: Once | ORAL | Status: AC
  Administered 2018-05-12: 324 mg via ORAL
  Filled 2018-05-12: qty 4

## 2018-05-12 MED ORDER — HEPARIN BOLUS VIA INFUSION
4000.0000 [IU] | Freq: Once | INTRAVENOUS | Status: AC
  Administered 2018-05-12: 4000 [IU] via INTRAVENOUS

## 2018-05-12 MED ORDER — HYDRALAZINE HCL 25 MG PO TABS
50.0000 mg | ORAL_TABLET | Freq: Three times a day (TID) | ORAL | Status: DC
  Administered 2018-05-12 – 2018-05-13 (×2): 50 mg via ORAL
  Filled 2018-05-12 (×2): qty 2

## 2018-05-12 MED ORDER — ASPIRIN 81 MG PO TABS
81.0000 mg | ORAL_TABLET | Freq: Every day | ORAL | Status: DC
  Filled 2018-05-12 (×2): qty 1

## 2018-05-12 MED ORDER — PANTOPRAZOLE SODIUM 40 MG PO TBEC
40.0000 mg | DELAYED_RELEASE_TABLET | Freq: Every day | ORAL | Status: DC
  Administered 2018-05-12 – 2018-05-13 (×2): 40 mg via ORAL
  Filled 2018-05-12 (×2): qty 1

## 2018-05-12 MED ORDER — ALBUTEROL SULFATE HFA 108 (90 BASE) MCG/ACT IN AERS: 1.0000 | INHALATION_SPRAY | Freq: Four times a day (QID) | RESPIRATORY_TRACT | Status: DC | PRN

## 2018-05-12 MED ORDER — NITROGLYCERIN 0.4 MG SL SUBL
0.4000 mg | SUBLINGUAL_TABLET | SUBLINGUAL | Status: DC | PRN
  Administered 2018-05-12: 0.4 mg via SUBLINGUAL
  Filled 2018-05-12: qty 1

## 2018-05-12 MED ORDER — PRAVASTATIN SODIUM 40 MG PO TABS
40.0000 mg | ORAL_TABLET | Freq: Every evening | ORAL | Status: DC
  Administered 2018-05-12: 40 mg via ORAL
  Filled 2018-05-12: qty 1

## 2018-05-12 MED ORDER — ONDANSETRON HCL 4 MG/2ML IJ SOLN: 4.0000 mg | Freq: Four times a day (QID) | INTRAMUSCULAR | Status: DC | PRN

## 2018-05-12 MED ORDER — GABAPENTIN 300 MG PO CAPS
300.0000 mg | ORAL_CAPSULE | Freq: Two times a day (BID) | ORAL | Status: DC
  Administered 2018-05-12 – 2018-05-13 (×2): 300 mg via ORAL
  Filled 2018-05-12 (×2): qty 1

## 2018-05-12 MED ORDER — HEPARIN (PORCINE) IN NACL 100-0.45 UNIT/ML-% IJ SOLN
850.0000 [IU]/h | INTRAMUSCULAR | Status: DC
  Administered 2018-05-12: 1000 [IU]/h via INTRAVENOUS
  Filled 2018-05-12: qty 250

## 2018-05-12 MED ORDER — TAMSULOSIN HCL 0.4 MG PO CAPS
0.4000 mg | ORAL_CAPSULE | Freq: Every day | ORAL | Status: DC
  Administered 2018-05-12: 0.4 mg via ORAL
  Filled 2018-05-12: qty 1

## 2018-05-12 MED ORDER — MORPHINE SULFATE (PF) 2 MG/ML IV SOLN: 2.0000 mg | INTRAVENOUS | Status: DC | PRN

## 2018-05-12 MED ORDER — ACETAMINOPHEN 650 MG RE SUPP: 650.0000 mg | Freq: Four times a day (QID) | RECTAL | Status: DC | PRN

## 2018-05-12 MED ORDER — ONDANSETRON HCL 4 MG PO TABS: 4.0000 mg | ORAL_TABLET | Freq: Four times a day (QID) | ORAL | Status: DC | PRN

## 2018-05-12 MED ORDER — ALBUTEROL SULFATE (2.5 MG/3ML) 0.083% IN NEBU
2.5000 mg | INHALATION_SOLUTION | Freq: Four times a day (QID) | RESPIRATORY_TRACT | Status: DC | PRN
Start: 2018-05-12 — End: 2018-05-13

## 2018-05-12 MED ORDER — INSULIN ASPART 100 UNIT/ML ~~LOC~~ SOLN
0.0000 [IU] | Freq: Three times a day (TID) | SUBCUTANEOUS | Status: DC
  Administered 2018-05-13: 5 [IU] via SUBCUTANEOUS

## 2018-05-12 MED ORDER — NIFEDIPINE ER OSMOTIC RELEASE 30 MG PO TB24
90.0000 mg | ORAL_TABLET | Freq: Every day | ORAL | Status: DC
  Administered 2018-05-12 – 2018-05-13 (×2): 90 mg via ORAL
  Filled 2018-05-12 (×2): qty 3

## 2018-05-12 NOTE — H&P (Signed)
History and Physical    Steve [name] TOI:[id number] DOB: 10-12-1936 DOA: 05/12/2018  PCP: Rosita Fire, MD   Patient coming from: Home  Chief Complaint: Chest pain  HPI: Steve [name] is a 82 y.o. male with medical history significant for HTN, DM, IBS prostate cancer, presented with constant chest pain, started yesterday, came to ED had meds that improved his chest pain, so was discharged home.  Patient called his cardiologist office this morning with complaints of even worse chest pain that started again last night and persisted to this morning.  Chest pain is described as a tightness, mid chest, involving right side of neck.  Chest pain is associated with shortness of breath, palpitations, lightheadedness, without nausea or vomiting. Patient denies recent travel, no history of blood clots, no prior cardiac history, no leg swelling redness or pain.  At baseline he is quite active.  He lives alone.  Chart was reviewed by cardiologist today-considering nonspecific T wave changes especially inferior leads, recommended Ed evaluation, and likely hospitalization.  ED Course: Heart rates 93-105.  Troponin- 0.03. Cr-elevated but about baseline 2.78.  Chest x-ray clear.  EKG nonspecific T wave changes inferiorly. Consult note by Dr. Harl Bowie- heparin drip was started, recommendations to admit here at [facility], echocardiogram.   Review of Systems: As per HPI all other systems reviewed and negative.  Past Medical History:  Diagnosis Date  . Arthritis   . CKD (chronic kidney disease) stage 3, GFR 30-59 ml/min (HCC)   . Essential hypertension   . Prostate cancer ([place])   . Type 2 diabetes mellitus ([place])     Past Surgical History:  Procedure Laterality Date  . CATARACT EXTRACTION W/PHACO Right 04/28/2016   Procedure: CATARACT EXTRACTION PHACO AND INTRAOCULAR LENS PLACEMENT (IOC);  Surgeon: Williams Che, MD;  Location: [facility];  Service: Ophthalmology;  Laterality: Right;  CDE: 4.28  .  COLONOSCOPY     about 2 years in [place]   . CYSTECTOMY       reports that he quit smoking about 30 years ago. His smoking use included pipe and cigars. He started smoking about 63 years ago. He has a 50.00 pack-year smoking history. He has never used smokeless tobacco. He reports that he does not drink alcohol or use drugs.  No Known Allergies  Family History  Problem Relation Age of Onset  . Heart disease Mother        cause of death  . Diabetes Sister   . Colon cancer Neg Hx     Prior to Admission medications   Medication Sig Start Date End Date Taking? Authorizing Provider  acetaminophen (TYLENOL) 650 MG CR tablet Take 650 mg by mouth every 8 (eight) hours as needed for pain.   Yes [provider]  albuterol (PROVENTIL HFA;VENTOLIN HFA) 108 (90 Base) MCG/ACT inhaler Inhale 1-2 puffs into the lungs every 6 (six) hours as needed for wheezing or shortness of breath.   Yes [provider]  aspirin 81 MG tablet Take 81 mg by mouth daily.   Yes [provider]  Cholecalciferol (VITAMIN D-3) 1000 UNITS CAPS Take 1 capsule by mouth once a week.    Yes [provider]  diazepam (VALIUM) 5 MG tablet Take 5 mg by mouth 2 (two) times daily as needed (for pelvic pain).   Yes [provider]  docusate sodium (COLACE) 100 MG capsule Take 100 mg by mouth daily.    Yes [provider]  gabapentin (NEURONTIN) 300 MG capsule Take  1 capsule by mouth 2 (two) times daily. 01/19/15  Yes [provider]  hydrALAZINE (APRESOLINE) 25 MG tablet Take 1 tablet (25 mg total) by mouth 3 (three) times daily. Patient taking differently: Take 50 mg by mouth 3 (three) times daily.  10/21/17 05/12/18 Yes Herminio Commons, MD  linagliptin (TRADJENTA) 5 MG TABS tablet Take 5 mg by mouth daily.   Yes [provider]  Lysine 500 MG CAPS Take 500 mg by mouth daily.   Yes [provider]  NIFEdipine (PROCARDIA XL/ADALAT-CC) 90 MG 24 hr tablet Take  1 tablet (90 mg total) by mouth daily. 10/16/17  Yes Herminio Commons, MD  omeprazole (PRILOSEC) 20 MG capsule Take 1 capsule by mouth 2 (two) times daily. 04/06/13  Yes [provider]  pravastatin (PRAVACHOL) 40 MG tablet Take 1 tablet by mouth every evening.  04/06/13  Yes [provider]  tamsulosin (FLOMAX) 0.4 MG CAPS Take 1 capsule by mouth 2 (two) times daily.  04/06/13  Yes [provider]    Physical Exam: Vitals:   05/12/18 1334 05/12/18 1600 05/12/18 1630 05/12/18 1700  BP: (!) 173/67 (!) 172/78 (!) 166/81 (!) 158/94  Pulse: 100 93  98  Resp: 14 10 (!) 25 17  Temp: 98.1 F (36.7 C)     TempSrc: Oral     SpO2: 100% 98%  99%  Weight:        Constitutional: NAD, calm, comfortable Vitals:   05/12/18 1334 05/12/18 1600 05/12/18 1630 05/12/18 1700  BP: (!) 173/67 (!) 172/78 (!) 166/81 (!) 158/94  Pulse: 100 93  98  Resp: 14 10 (!) 25 17  Temp: 98.1 F (36.7 C)     TempSrc: Oral     SpO2: 100% 98%  99%  Weight:       Eyes: PERRL, lids and conjunctivae normal ENMT: Mucous membranes are moist. Posterior pharynx clear of any exudate or lesions. Neck: normal, supple, no masses, no thyromegaly Respiratory: clear to auscultation bilaterally, no wheezing, no crackles. Normal respiratory effort. No accessory muscle use.  Cardiovascular: Regular rate and rhythm, no murmurs / rubs / gallops. No extremity edema. 2+ pedal pulses. Abdomen: no tenderness, no masses palpated. No hepatosplenomegaly. Bowel sounds positive.  Musculoskeletal: no clubbing / cyanosis. No joint deformity upper and lower extremities. Good ROM, no contractures. Normal muscle tone.  Skin: no rashes, lesions, ulcers. No induration Neurologic: CN 2-12 grossly intact. Strength 5/5 in all 4.  Psychiatric: Normal judgment and insight. Alert and oriented x 3. Normal mood.   Labs on Admission: I have personally reviewed following labs and imaging studies  CBC: Recent Labs  Lab  05/11/18 1037 05/11/18 1428 05/12/18 1407  WBC 5.9  --  6.6  NEUTROABS 4.0  --   --   HGB 10.6* 10.9* 10.7*  HCT 32.1* 32.0* 31.7*  MCV 78.9  --  78.7  PLT 186  --  585   Basic Metabolic Panel: Recent Labs  Lab 05/11/18 1037 05/11/18 1049 05/11/18 1428 05/12/18 1407  NA 133*  --  142 137  K 6.0*  --  4.3 4.8  CL 103  --  108 109  CO2 20*  --   --  20*  GLUCOSE 217*  --  108* 138*  BUN 31*  --  28* 28*  CREATININE 2.78*  --  2.60* 2.78*  CALCIUM 8.8*  --   --  9.0  MG  --  2.6*  --   --  GFR: Estimated Creatinine Clearance: 21.1 mL/min (A) (by C-G formula based on SCr of 2.78 mg/dL (H)). Liver Function Tests: Recent Labs  Lab 05/11/18 1037  AST 41  ALT 28  ALKPHOS 74  BILITOT 1.0  PROT 7.3  ALBUMIN 3.6    Cardiac Enzymes: Recent Labs  Lab 05/11/18 1037 05/12/18 1407  TROPONINI <0.03 0.03*   CBG: Recent Labs  Lab 05/11/18 1312  GLUCAP 206*    Radiological Exams on Admission: Dg Chest 2 View  Result Date: 05/12/2018 CLINICAL DATA:  Chest pain with shortness of breath EXAM: CHEST - 2 VIEW COMPARISON:  05/11/2018, 04/30/2018, 03/25/2018 FINDINGS: Cardiomegaly. Minimal basilar atelectasis. No pleural effusion. Aortic atherosclerosis. No pneumothorax. IMPRESSION: No active cardiopulmonary disease. Cardiomegaly with minimal basilar atelectasis Electronically Signed   By: Donavan Foil M.D.   On: 05/12/2018 17:12   Dg Chest 2 View  Result Date: 05/11/2018 CLINICAL DATA:  Chest pain. EXAM: CHEST - 2 VIEW COMPARISON:  Radiographs of April 30, 2018. FINDINGS: The heart size and mediastinal contours are within normal limits. Both lungs are clear. No pneumothorax or pleural effusion is noted. The visualized skeletal structures are unremarkable. IMPRESSION: No active cardiopulmonary disease. Electronically Signed   By: Marijo Conception, M.D.   On: 05/11/2018 10:54    EKG: Independently reviewed.  T waves lead III aVF appear flipped.  QTc 429.    Assessment/Plan Principal Problem:   CHEST PAIN-UNSPECIFIED Active Problems:   HTN (hypertension)   Prostate cancer (HCC)   DM (diabetes mellitus), type 2 with renal complications ([place])    Chest pain-constant.  Remote smoking history.  Cardiac history.  Heart score ~5.  EKG with T wave inversions inferior leads III and aVF.  Trop X 1 0.03.  -Follow-up cardiology recommendations in a.m. -Trend troponins x3 -Echocardiogram -EKG a.m. - Continue heparin drip pharmacy ACS protocol - Lipid panel -N.p.o. Midnight, for possible stress test  HTN-elevated.   - Hydralazine dose recently increased by nephrologist 1 week ago, to 50mg  TID, will continue, continue nifedipine  DM-glucose 138. - SSI, hold home linagliptin at this time - HgbA1c  CKD 4-creatinine stable 2.7, baseline 2.5-2.7.  Prostate cancer-completed radiation treatment.- 2015.  Follows with urologist [facility]. Patient does not remember the name.  Last visit 01/2018.   DVT prophylaxis: Heparin Code Status: Full Family Communication: None at bedside Disposition Plan: per rounding team Consults called: Cardiology Admission status: obs, tele   Bethena Roys MD [facility] Pager [phone] From 6PM-2AM.  Otherwise please contact night-coverage [email or web address] Password [facility]  05/12/2018, 5:53 PM

## 2018-05-12 NOTE — Telephone Encounter (Signed)
Patient walked into the office stating that he was seen in [place] on 05-11-2018 for CP. States that he continues to have tightness in chest with shortness of breath.

## 2018-05-12 NOTE — Telephone Encounter (Signed)
Pt walked in c/o SOB and chest tightness and pain although says not as bad as yesterday - was seen in the ED yesterday for same symptoms and potassium was 6 - potassium went down to 4 and pt was discharged without symptoms - says when he home last night symptoms of SOB/chest tightness reoccurred and have continued through last and today and pt decided to come by office today - say HR last night was 119-123 then came back down - says nephrologist recently changed hydralazine 50 mg tid - BP today is 150/78 HR 102

## 2018-05-12 NOTE — ED Notes (Signed)
EKG given to Dr. By Nurse tech

## 2018-05-12 NOTE — ED Triage Notes (Signed)
Pt was seen at a PCP today and was sent here due to chest tightness. Central chest. SOB upon exertion. Pt has no history of cardiac issues he states.

## 2018-05-12 NOTE — ED Provider Notes (Signed)
[facility] EMERGENCY DEPARTMENT Provider Note   CSN: [id number] Arrival date & time: 05/12/18  1322     History   Chief Complaint Chief Complaint  Patient presents with  . Chest Pain    HPI Steve [name] is a 82 y.o. male.  HPI Pt was seen at 1540. Per pt, c/o gradual onset and persistence of constant chest "tightness" that began last night approximately 2100. States his chest "tightness" has been constant, "somewhat better" today, but still present.  Has been associated with SOB, esp on exertion, and intermittent palpitations, with HR to "120's." Pt was evaluated in the ED yesterday for his symptoms with reassuring work up. Pt states he "felt better after the meds they gave me," but his symptoms returned last night. Pt called his Cards MD and was told to come to the ED for further evaluation and admission.  Denies cough, no back pain, no abd pain, no N/V/D, no injury.   Past Medical History:  Diagnosis Date  . Arthritis   . CKD (chronic kidney disease) stage 3, GFR 30-59 ml/min (HCC)   . Essential hypertension   . Prostate cancer ([place])   . Type 2 diabetes mellitus [facility])     Patient Active Problem List   Diagnosis Date Noted  . Elevated troponin 07/02/2017  . DM (diabetes mellitus), type 2 with renal complications ([place]) 60/07/9322  . IBS (irritable bowel syndrome) 01/28/2017  . Prostate cancer ([place]) 07/10/2014  . Abnormal ECG 04/12/2013  . HTN (hypertension) 04/12/2013  . SOB (shortness of breath) 04/12/2013  . CHEST PAIN-UNSPECIFIED 06/12/2009    Past Surgical History:  Procedure Laterality Date  . CATARACT EXTRACTION W/PHACO Right 04/28/2016   Procedure: CATARACT EXTRACTION PHACO AND INTRAOCULAR LENS PLACEMENT (IOC);  Surgeon: Williams Che, MD;  Location: [facility];  Service: Ophthalmology;  Laterality: Right;  CDE: 4.28  . COLONOSCOPY     about 2 years in [place]   . CYSTECTOMY          Home Medications    Prior to Admission medications   Medication Sig  Start Date End Date Taking? Authorizing Provider  acetaminophen (TYLENOL) 650 MG CR tablet Take 650 mg by mouth every 8 (eight) hours as needed for pain.    [provider]  albuterol (PROVENTIL HFA;VENTOLIN HFA) 108 (90 Base) MCG/ACT inhaler Inhale 1-2 puffs into the lungs every 6 (six) hours as needed for wheezing or shortness of breath.    [provider]  aspirin 81 MG tablet Take 81 mg by mouth daily.    [provider]  Cholecalciferol (VITAMIN D-3) 1000 UNITS CAPS Take 1 capsule by mouth once a week.     [provider]  diazepam (VALIUM) 5 MG tablet Take 5 mg by mouth 2 (two) times daily as needed (for pelvic pain).    [provider]  docusate sodium (COLACE) 100 MG capsule Take 100 mg by mouth daily.     [provider]  gabapentin (NEURONTIN) 300 MG capsule Take 1 capsule by mouth 2 (two) times daily. 01/19/15   [provider]  hydrALAZINE (APRESOLINE) 25 MG tablet Take 1 tablet (25 mg total) by mouth 3 (three) times daily. Patient taking differently: Take 50 mg by mouth 3 (three) times daily.  10/21/17 05/11/18  Herminio Commons, MD  linagliptin (TRADJENTA) 5 MG TABS tablet Take 5 mg by mouth daily.    [provider]  Lysine 500 MG CAPS Take 500 mg by mouth daily.  [provider]  NIFEdipine (PROCARDIA XL/ADALAT-CC) 90 MG 24 hr tablet Take 1 tablet (90 mg total) by mouth daily. 10/16/17   Herminio Commons, MD  omeprazole (PRILOSEC) 20 MG capsule Take 1 capsule by mouth 2 (two) times daily. 04/06/13   [provider]  pravastatin (PRAVACHOL) 40 MG tablet Take 1 tablet by mouth every evening.  04/06/13   [provider]  tamsulosin (FLOMAX) 0.4 MG CAPS Take 1 capsule by mouth 2 (two) times daily.  04/06/13   [provider]    Family History Family History  Problem Relation Age of Onset  . Heart disease Mother        cause of death  . Diabetes Sister   . Colon cancer Neg Hx      Social History Social History   Tobacco Use  . Smoking status: Former Smoker    Packs/day: 2.00    Years: 25.00    Pack years: 50.00    Types: Pipe, Cigars    Start date: 11/08/1954    Last attempt to quit: 10/14/1987    Years since quitting: 30.5  . Smokeless tobacco: Never Used  . Tobacco comment: About 2 pipes a day  Substance Use Topics  . Alcohol use: No    Alcohol/week: 0.0 oz  . Drug use: No     Allergies   Patient has no known allergies.   Review of Systems Review of Systems ROS: Statement: All systems negative except as marked or noted in the HPI; Constitutional: Negative for fever and chills. ; ; Eyes: Negative for eye pain, redness and discharge. ; ; ENMT: Negative for ear pain, hoarseness, nasal congestion, sinus pressure and sore throat. ; ; Cardiovascular: +CP, SOB, palpitations. Negative for diaphoresis, and peripheral edema. ; ; Respiratory: Negative for cough, wheezing and stridor. ; ; Gastrointestinal: Negative for nausea, vomiting, diarrhea, abdominal pain, blood in stool, hematemesis, jaundice and rectal bleeding. . ; ; Genitourinary: Negative for dysuria, flank pain and hematuria. ; ; Musculoskeletal: Negative for back pain and neck pain. Negative for swelling and trauma.; ; Skin: Negative for pruritus, rash, abrasions, blisters, bruising and skin lesion.; ; Neuro: Negative for headache, lightheadedness and neck stiffness. Negative for weakness, altered level of consciousness, altered mental status, extremity weakness, paresthesias, involuntary movement, seizure and syncope.      Physical Exam Updated Vital Signs BP (!) 172/78   Pulse 93   Temp 98.1 F (36.7 C) (Oral)   Resp 10   Wt 83.9 kg (185 lb)   SpO2 98%   BMI 30.79 kg/m    Patient Vitals for the past 24 hrs:  BP Temp Temp src Pulse Resp SpO2 Weight  05/12/18 1700 (!) 158/94 - - 98 17 99 % -  05/12/18 1630 (!) 166/81 - - - (!) 25 - -  05/12/18 1600 (!) 172/78 - - 93 10 98 % -  05/12/18  1334 (!) 173/67 98.1 F (36.7 C) Oral 100 14 100 % -  05/12/18 1332 - - - - - - 83.9 kg (185 lb)     Physical Exam 1540; Physical examination:  Nursing notes reviewed; Vital signs and O2 SAT reviewed;  Constitutional: Well developed, Well nourished, Well hydrated, In no acute distress; Head:  Normocephalic, atraumatic; Eyes: EOMI, PERRL, No scleral icterus; ENMT: Mouth and pharynx normal, Mucous membranes moist; Neck: Supple, Full range of motion, No lymphadenopathy; Cardiovascular: Tachycardic rate and rhythm, No gallop; Respiratory: Breath sounds clear & equal bilaterally, No wheezes.  Speaking full  sentences with ease, Normal respiratory effort/excursion; Chest: Nontender, Movement normal; Abdomen: Soft, Nontender, Nondistended, Normal bowel sounds; Genitourinary: No CVA tenderness; Extremities: Peripheral pulses normal, No tenderness, No edema, No calf edema or asymmetry.; Neuro: AA&Ox3, Major CN grossly intact.  Speech clear. No gross focal motor or sensory deficits in extremities.; Skin: Color normal, Warm, Dry.   ED Treatments / Results  Labs (all labs ordered are listed, but only abnormal results are displayed)   EKG EKG Interpretation  Date/Time:  Wednesday May 12 2018 13:34:17 EDT Ventricular Rate:  103 PR Interval:  156 QRS Duration: 90 QT Interval:  328 QTC Calculation: 429 R Axis:   112 Text Interpretation:  Sinus tachycardia Right axis deviation ST & T wave abnormality, consider inferolateral ischemia When compared with ECG of 05/11/2018 Nonspecific ST and T wave abnormality Inferior leads is now Present Confirmed by Francine Graven [phone]) on 05/12/2018 3:40:50 PM   Radiology   Procedures Procedures (including critical care time)  Medications Ordered in ED Medications  nitroGLYCERIN (NITROSTAT) SL tablet 0.4 mg (has no administration in time range)  morphine 4 MG/ML injection 4 mg (has no administration in time range)  heparin bolus via infusion 4,000 Units (has  no administration in time range)  heparin ADULT infusion 100 units/mL (25000 units/255mL sodium chloride 0.45%) (has no administration in time range)  aspirin chewable tablet 324 mg (324 mg Oral Given 05/12/18 1559)     Initial Impression / Assessment and Plan / ED Course  I have reviewed the triage vital signs and the nursing notes.  Pertinent labs & imaging results that were available during my care of the patient were reviewed by me and considered in my medical decision making (see chart for details).  MDM Reviewed: previous chart, nursing note and vitals Reviewed previous: labs and ECG Interpretation: labs, ECG and x-ray Total time providing critical care: 30-74 minutes. This excludes time spent performing separately reportable procedures and services. Consults: cardiology and admitting MD   CRITICAL CARE Performed by: Francine Graven Total critical care time: 35 minutes Critical care time was exclusive of separately billable procedures and treating other patients. Critical care was necessary to treat or prevent imminent or life-threatening deterioration. Critical care was time spent personally by me on the following activities: development of treatment plan with patient and/or surrogate as well as nursing, discussions with consultants, evaluation of patient's response to treatment, examination of patient, obtaining history from patient or surrogate, ordering and performing treatments and interventions, ordering and review of laboratory studies, ordering and review of radiographic studies, pulse oximetry and re-evaluation of patient's condition.  . Results for orders placed or performed during the hospital encounter of 28/41/32  Basic metabolic panel  Result Value Ref Range   Sodium 137 135 - 145 mmol/L   Potassium 4.8 3.5 - 5.1 mmol/L   Chloride 109 98 - 111 mmol/L   CO2 20 (L) 22 - 32 mmol/L   Glucose, Bld 138 (H) 70 - 99 mg/dL   BUN 28 (H) 8 - 23 mg/dL   Creatinine, Ser  2.78 (H) 0.61 - 1.24 mg/dL   Calcium 9.0 8.9 - 10.3 mg/dL   GFR calc non Af Amer 20 (L) >60 mL/min   GFR calc Af Amer 23 (L) >60 mL/min   Anion gap 8 5 - 15  CBC  Result Value Ref Range   WBC 6.6 4.0 - 10.5 K/uL   RBC 4.03 (L) 4.22 - 5.81 MIL/uL   Hemoglobin 10.7 (L) 13.0 - 17.0 g/dL  HCT 31.7 (L) 39.0 - 52.0 %   MCV 78.7 78.0 - 100.0 fL   MCH 26.6 26.0 - 34.0 pg   MCHC 33.8 30.0 - 36.0 g/dL   RDW 13.9 11.5 - 15.5 %   Platelets 189 150 - 400 K/uL  Troponin I  Result Value Ref Range   Troponin I 0.03 (HH) <0.03 ng/mL   Dg Chest 2 View Result Date: 05/12/2018 CLINICAL DATA:  Chest pain with shortness of breath EXAM: CHEST - 2 VIEW COMPARISON:  05/11/2018, 04/30/2018, 03/25/2018 FINDINGS: Cardiomegaly. Minimal basilar atelectasis. No pleural effusion. Aortic atherosclerosis. No pneumothorax. IMPRESSION: No active cardiopulmonary disease. Cardiomegaly with minimal basilar atelectasis Electronically Signed   By: Donavan Foil M.D.   On: 05/12/2018 17:12    1545:  BUN/Cr per baseline. Troponin high normal, EKG today with NS STTW changes inferior leads that appear more prominent/new since immediate previous tracing.  T/C returned from Cards Dr. Harl Bowie, case discussed, including:  HPI, pertinent PM/SHx, VS/PE, dx testing, ED course and treatment:  Agreeable to consult, no acute intervention needed at this time, requests to admit to medicine service, OK to stay at [facility].   1750:  Pt given ASA, SL ntg with improvement in CP. IV heparin started per Cards recommendation. T/C returned from [facility] Dr. Denton Brick, case discussed, including:  HPI, pertinent PM/SHx, VS/PE, dx testing, ED course and treatment:  Agreeable to admit.   Final Clinical Impressions(s) / ED Diagnoses   Final diagnoses:  None    ED Discharge Orders    None       Francine Graven, DO 05/16/18 0820

## 2018-05-12 NOTE — Telephone Encounter (Signed)
I reviewed the chart.  Patient of Dr. Bronson Ing.  He was seen in the ER last night, at that time troponin I level was negative, ECG with nonspecific ST-T wave abnormalities.  He walked into the office today stating that he developed shortness of breath and chest discomfort after being discharged home from the ER, also indicating a sense of palpitations and heart rate up to 120 bpm.  He continues to report chest discomfort in the office, follow-up ECG obtained, again showing overall nonspecific ST-T wave abnormalities, however more prominent in the inferior leads compared to last tracing.  He follows with Dr. Bronson Ing for management of hypertension, last ischemic evaluation was approximately 2 years ago.  He does have CKD stage III-IV with recent creatinine 2.6, has not undergone invasive cardiac evaluation.  Recommendation is for him to be reevaluated in the ER anticipating admission to the hospital for further evaluation of his chest discomfort and medication adjustments.

## 2018-05-12 NOTE — ED Notes (Signed)
Patient transported to X-ray 

## 2018-05-12 NOTE — Progress Notes (Signed)
Patient discussed with ER staff, Dr McDowell's clinic note reviewed. Recommendation for admission to medicine at [facility] to cycle cardiac enzymes and EKGs, echo, npo at midnight for likely stress test tomorrow. Given his renal function would have to be very high risk findings from further workup to even consider possible cath. He has had prior admission with nonspecific troponon elevation. Full consult to follow tomorrow AM once more data is back. He has had intermittent inferior/lateral ST/T changes on prior EKGs (see 06/2017), more prominent today. Given EKG, very mild trop would anticoagulate at this time. Symptoms fairly nonspecific of palpitations and SOB.    Zandra Abts MD

## 2018-05-12 NOTE — Progress Notes (Signed)
ANTICOAGULATION CONSULT NOTE - Initial Consult  Pharmacy Consult for heparin Indication: ACS/STEMI  No Known Allergies  Patient Measurements: Weight: 185 lb (83.9 kg) Heparin Dosing Weight: 79 kg  Vital Signs: Temp: 98.1 F (36.7 C) (07/31 1334) Temp Source: Oral (07/31 1334) BP: 173/67 (07/31 1334) Pulse Rate: 100 (07/31 1334)  Labs: Recent Labs    05/11/18 1037 05/11/18 1428 05/12/18 1407  HGB 10.6* 10.9* 10.7*  HCT 32.1* 32.0* 31.7*  PLT 186  --  189  CREATININE 2.78* 2.60* 2.78*  TROPONINI <0.03  --  0.03*    Estimated Creatinine Clearance: 21.1 mL/min (A) (by C-G formula based on SCr of 2.78 mg/dL (H)).   Medical History: Past Medical History:  Diagnosis Date  . Arthritis   . CKD (chronic kidney disease) stage 3, GFR 30-59 ml/min (HCC)   . Essential hypertension   . Prostate cancer ([place])   . Type 2 diabetes mellitus (HCC)     Medications:   (Not in a hospital admission)  Assessment: Pharmacy consulted to dose heparin in patient for ACS/STEMI.  Patient has no prior to admission anticoagulants listed.  Goal of Therapy:  Heparin level 0.3-0.7 units/ml Monitor platelets by anticoagulation protocol: Yes   Plan:  Give 4000 units bolus x 1 Start heparin infusion at 1000 units/hr Check anti-Xa level in 8 hours and daily while on heparin Continue to monitor H&H and platelets  Revonda Standard Monifah Freehling 05/12/2018,3:58 PM

## 2018-05-12 NOTE — Telephone Encounter (Signed)
Per Dr Domenic Polite DOD [place] Noss today - pt will go back to [facility] ED has wife with him that will take him.

## 2018-05-12 NOTE — ED Notes (Signed)
CRITICAL VALUE ALERT  Critical Value:  Troponin 0.03   Date & Time Notied:  05/12/18 1527   Provider Notified: Yes   Orders Received/Actions taken: No orders yet

## 2018-05-12 NOTE — Telephone Encounter (Signed)
EKG done with pt having active chest tightness and pain (pt doesn't appear to be in any distress) and will have provider review

## 2018-05-13 ENCOUNTER — Observation Stay (HOSPITAL_BASED_OUTPATIENT_CLINIC_OR_DEPARTMENT_OTHER): Payer: Medicare HMO

## 2018-05-13 ENCOUNTER — Encounter (HOSPITAL_COMMUNITY): Payer: Self-pay

## 2018-05-13 DIAGNOSIS — I10 Essential (primary) hypertension: Secondary | ICD-10-CM

## 2018-05-13 DIAGNOSIS — N184 Chronic kidney disease, stage 4 (severe): Secondary | ICD-10-CM | POA: Insufficient documentation

## 2018-05-13 DIAGNOSIS — E1122 Type 2 diabetes mellitus with diabetic chronic kidney disease: Secondary | ICD-10-CM

## 2018-05-13 DIAGNOSIS — C61 Malignant neoplasm of prostate: Secondary | ICD-10-CM | POA: Diagnosis not present

## 2018-05-13 DIAGNOSIS — Z794 Long term (current) use of insulin: Secondary | ICD-10-CM | POA: Diagnosis not present

## 2018-05-13 DIAGNOSIS — R079 Chest pain, unspecified: Secondary | ICD-10-CM

## 2018-05-13 DIAGNOSIS — I361 Nonrheumatic tricuspid (valve) insufficiency: Secondary | ICD-10-CM

## 2018-05-13 LAB — LIPID PANEL
Cholesterol: 144 mg/dL (ref 0–200)
HDL: 52 mg/dL (ref >40)
LDL Cholesterol: 83 mg/dL (ref 0–99)
Total CHOL/HDL Ratio: 2.8 RATIO
Triglycerides: 46 mg/dL (ref <150)
VLDL: 9 mg/dL (ref 0–40)

## 2018-05-13 LAB — ECHOCARDIOGRAM COMPLETE
Height: 65 in
Weight: 2960 oz

## 2018-05-13 LAB — NM MYOCAR MULTI W/SPECT W/WALL MOTION / EF
LV dias vol: 85 mL (ref 62–150)
LV sys vol: 28 mL
Peak HR: 123 {beats}/min
RATE: 0.35
Rest HR: 80 {beats}/min
SDS: 1
SRS: 0
SSS: 1
TID: 1.01

## 2018-05-13 LAB — TROPONIN I: Troponin I: 0.03 ng/mL (ref <0.03)

## 2018-05-13 LAB — CBC
HCT: 30.4 % — ABNORMAL LOW (ref 39.0–52.0)
Hemoglobin: 10.2 g/dL — ABNORMAL LOW (ref 13.0–17.0)
MCH: 26.5 pg (ref 26.0–34.0)
MCHC: 33.6 g/dL (ref 30.0–36.0)
MCV: 79 fL (ref 78.0–100.0)
Platelets: 181 10*3/uL (ref 150–400)
RBC: 3.85 MIL/uL — ABNORMAL LOW (ref 4.22–5.81)
RDW: 13.9 % (ref 11.5–15.5)
WBC: 5.8 10*3/uL (ref 4.0–10.5)

## 2018-05-13 LAB — GLUCOSE, CAPILLARY
Glucose-Capillary: 126 mg/dL — ABNORMAL HIGH (ref 70–99)
Glucose-Capillary: 289 mg/dL — ABNORMAL HIGH (ref 70–99)

## 2018-05-13 LAB — HEPARIN LEVEL (UNFRACTIONATED)
Heparin Unfractionated: 0.77 IU/mL — ABNORMAL HIGH (ref 0.30–0.70)
Heparin Unfractionated: 0.63 IU/mL (ref 0.30–0.70)

## 2018-05-13 LAB — HEMOGLOBIN A1C
Hgb A1c MFr Bld: 7 % — ABNORMAL HIGH (ref 4.8–5.6)
Mean Plasma Glucose: 154.2 mg/dL

## 2018-05-13 LAB — TSH: TSH: 1.416 u[IU]/mL (ref 0.350–4.500)

## 2018-05-13 MED ORDER — METOPROLOL TARTRATE 25 MG PO TABS: 12.5000 mg | ORAL_TABLET | Freq: Two times a day (BID) | ORAL | 0 refills | Status: DC

## 2018-05-13 MED ORDER — SODIUM CHLORIDE 0.9% FLUSH
INTRAVENOUS | Status: AC
  Administered 2018-05-13: 10 mL via INTRAVENOUS
  Filled 2018-05-13: qty 10

## 2018-05-13 MED ORDER — REGADENOSON 0.4 MG/5ML IV SOLN
INTRAVENOUS | Status: AC
  Administered 2018-05-13: 0.4 mg via INTRAVENOUS
  Filled 2018-05-13: qty 5

## 2018-05-13 MED ORDER — METOPROLOL TARTRATE 25 MG PO TABS: 12.5000 mg | ORAL_TABLET | Freq: Two times a day (BID) | ORAL | Status: DC

## 2018-05-13 MED ORDER — TECHNETIUM TC 99M TETROFOSMIN IV KIT
30.0000 | PACK | Freq: Once | INTRAVENOUS | Status: AC | PRN
  Administered 2018-05-13: 32 via INTRAVENOUS

## 2018-05-13 MED ORDER — HYDRALAZINE HCL 25 MG PO TABS: 50.0000 mg | ORAL_TABLET | Freq: Three times a day (TID) | ORAL | Status: DC

## 2018-05-13 MED ORDER — ASPIRIN EC 81 MG PO TBEC
81.0000 mg | DELAYED_RELEASE_TABLET | Freq: Every day | ORAL | Status: DC
  Administered 2018-05-13: 81 mg via ORAL
  Filled 2018-05-13: qty 1

## 2018-05-13 MED ORDER — TECHNETIUM TC 99M TETROFOSMIN IV KIT
10.0000 | PACK | Freq: Once | INTRAVENOUS | Status: AC | PRN
  Administered 2018-05-13: 11 via INTRAVENOUS

## 2018-05-13 NOTE — Progress Notes (Signed)
IV removed and discharge instructions reviewed.  To call primary for f/u appt and labs.  Appt with cardiology arranged.  Script sent to pharmacy.  Family at bedside and to drive home

## 2018-05-13 NOTE — Progress Notes (Signed)
*  PRELIMINARY RESULTS* Echocardiogram 2D Echocardiogram has been performed.  Leavy Cella 05/13/2018, 10:59 AM

## 2018-05-13 NOTE — Progress Notes (Addendum)
Lexiscan is normal without evidence of ischemi. Echo with normal LVEF, no wall motion abnormalities. No further cardiac testing planned at this time. D/c heparin   [facility] will sign off.   Medication Recommendations:  Continue current therapy including lopressor Other recommendations (labs, testing, etc):  none Follow up as an outpatient:  We will arrange in 3 weeks   Carlyle Dolly MD

## 2018-05-13 NOTE — Consult Note (Addendum)
Cardiology Consult    Patient ID: Steve [name]; [id number]; [birth year]   Admit date: 05/12/2018 Date of Consult: 05/13/2018  Primary Care Provider: Rosita Fire, MD Primary Cardiologist: Kate Sable, MD   Patient Profile    Steve [name] is a 82 y.o. male with past medical history of HTN, HLD, Type 2 DM, prostate cancer (s/p radiation) and Stage 4 CKD who is being seen today for the evaluation of chest pain at the request of Dr. Denton Brick.   History of Present Illness    Steve [name] was last examined by Dr. Bronson Ing in 10/2017 and denied any recent chest discomfort or dyspnea on exertion at that time. He reported having elevated blood pressure when checked in the ambulatory setting for the past several months. BP was elevated to 150/66 at the time of his visit, therefore he was started on Hydralazine 25 mg 3 times daily and continued on Losartan 100 mg daily and Nifedipine 90 mg daily.  In the interim, he presented to [facility] ED on 05/11/2018 for worsening shortness of breath and chest discomfort. He was found to be hyperkalemic with K+ at 6.0 which improved to 4.3 following administration of Albuterol, Insulin, Dextrose, and Bicarbonate. He was discharged from the ED and informed to follow-up with Cardiology in the outpatient setting.  He therefore walked into the [place] office on 7/31 for recurrent chest discomfort and shortness of breath. Also reported intermittent palpitations with heart rate becoming elevated to 120 bpm when checked at home. It was recommended that he return to the ED for reevaluation and likely admission.   In talking with the patient today, he reports having intermittent chest tightness for the past 3-4 weeks. Was hospitalized in [place] several weeks ago for dehydration and says symptoms started after this. He describes episodes of chest tightness occurring at both rest and with activity, typically lasting for several minutes then spontaneously  resolving. He reports intermittent episodes of palpitations as well which typically occurs at night. His chest discomfort can occur simultaneously with the palpitations or by itself. He has chronic dyspnea on exertion but denies any acute changes in his respiratory status. No recent orthopnea, PND, or lower extremity edema. 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   Initial labs show WBC 6.6, Hgb 10.7, platelets 189, Na+ 137, K+ 4.8, and creatinine 2.78 (close to baseline). Initial and cyclic troponin values have been flat at 0.03. CXR shows cardiomegaly with minimal basilar atelectasis and no active cardiopulmonary disease. EKG shows sinus tachycardia, heart rate 103, with diffuse T wave inversion along inferolateral leads which is similar to prior tracings but more prominent at this time. Telemetry reviewed which shows NSR with occasional PAC's and PVC's. One episode of sinus tachycardia with HR in the 110's. No significant arrhythmias.    Past Medical History:  Diagnosis Date  . Arthritis   . CKD (chronic kidney disease) stage 4, GFR 15-29 ml/min (HCC)   . Essential hypertension   . Prostate cancer ([place])   . Renal insufficiency   . Type 2 diabetes mellitus ([place])     Past Surgical History:  Procedure Laterality Date  . CATARACT EXTRACTION W/PHACO Right 04/28/2016   Procedure: CATARACT EXTRACTION PHACO AND INTRAOCULAR LENS PLACEMENT (IOC);  Surgeon: Williams Che, MD;  Location: [facility];  Service: Ophthalmology;  Laterality: Right;  CDE: 4.28  . COLONOSCOPY     about 2 years in [place]   . CYSTECTOMY  Home Medications:  Prior to Admission medications   Medication Sig Start Date End Date Taking? Authorizing Provider  acetaminophen (TYLENOL) 650 MG CR tablet Take 650 mg by mouth every 8 (eight) hours as needed for pain.   Yes [provider]  albuterol (PROVENTIL HFA;VENTOLIN HFA) 108 (90 Base) MCG/ACT inhaler Inhale 1-2 puffs into the lungs  every 6 (six) hours as needed for wheezing or shortness of breath.   Yes [provider]  aspirin 81 MG tablet Take 81 mg by mouth daily.   Yes [provider]  Cholecalciferol (VITAMIN D-3) 1000 UNITS CAPS Take 1 capsule by mouth once a week.    Yes [provider]  diazepam (VALIUM) 5 MG tablet Take 5 mg by mouth 2 (two) times daily as needed (for pelvic pain).   Yes [provider]  docusate sodium (COLACE) 100 MG capsule Take 100 mg by mouth daily.    Yes [provider]  gabapentin (NEURONTIN) 300 MG capsule Take 1 capsule by mouth 2 (two) times daily. 01/19/15  Yes [provider]  hydrALAZINE (APRESOLINE) 25 MG tablet Take 1 tablet (25 mg total) by mouth 3 (three) times daily. Patient taking differently: Take 50 mg by mouth 3 (three) times daily.  10/21/17 05/12/18 Yes Herminio Commons, MD  linagliptin (TRADJENTA) 5 MG TABS tablet Take 5 mg by mouth daily.   Yes [provider]  Lysine 500 MG CAPS Take 500 mg by mouth daily.   Yes [provider]  NIFEdipine (PROCARDIA XL/ADALAT-CC) 90 MG 24 hr tablet Take 1 tablet (90 mg total) by mouth daily. 10/16/17  Yes Herminio Commons, MD  omeprazole (PRILOSEC) 20 MG capsule Take 1 capsule by mouth 2 (two) times daily. 04/06/13  Yes [provider]  pravastatin (PRAVACHOL) 40 MG tablet Take 1 tablet by mouth every evening.  04/06/13  Yes [provider]  tamsulosin (FLOMAX) 0.4 MG CAPS Take 1 capsule by mouth 2 (two) times daily.  04/06/13  Yes [provider]    Inpatient Medications: Scheduled Meds: . aspirin EC  81 mg Oral Daily  . docusate sodium  100 mg Oral Daily  . gabapentin  300 mg Oral BID  . hydrALAZINE  50 mg Oral TID  . insulin aspart  0-9 Units Subcutaneous TID WC  . metoprolol tartrate  12.5 mg Oral BID  . NIFEdipine  90 mg Oral Daily  . pantoprazole  40 mg Oral Daily  . pravastatin  40 mg Oral QPM  . tamsulosin  0.4 mg Oral QPC  supper   Continuous Infusions:  PRN Meds: acetaminophen **OR** acetaminophen, albuterol, morphine injection, nitroGLYCERIN, ondansetron **OR** ondansetron (ZOFRAN) IV, technetium tetrofosmin  Allergies:   No Known Allergies  Social History:   Social History   Socioeconomic History  . Marital status: Divorced    Spouse name: Not on file  . Number of children: Not on file  . Years of education: Not on file  . Highest education level: Not on file  Occupational History  . Occupation: Retired  Scientific laboratory technician  . Financial resource strain: Not on file  . Food insecurity:    Worry: Not on file    Inability: Not on file  . Transportation needs:    Medical: Not on file    Non-medical: Not on file  Tobacco Use  . Smoking status: Former Smoker    Packs/day: 2.00    Years: 25.00    Pack years: 50.00    Types: Pipe,  Cigars    Start date: 11/08/1954    Last attempt to quit: 10/14/1987    Years since quitting: 30.6  . Smokeless tobacco: Never Used  . Tobacco comment: About 2 pipes a day  Substance and Sexual Activity  . Alcohol use: No    Alcohol/week: 0.0 oz  . Drug use: No  . Sexual activity: Not on file  Lifestyle  . Physical activity:    Days per week: Not on file    Minutes per session: Not on file  . Stress: Not on file  Relationships  . Social connections:    Talks on phone: Not on file    Gets together: Not on file    Attends religious service: Not on file    Active member of club or organization: Not on file    Attends meetings of clubs or organizations: Not on file    Relationship status: Not on file  . Intimate partner violence:    Fear of current or ex partner: Not on file    Emotionally abused: Not on file    Physically abused: Not on file    Forced sexual activity: Not on file  Other Topics Concern  . Not on file  Social History Narrative  . Not on file     Family History:    Family History  Problem Relation Age of Onset  . Heart disease Mother         cause of death  . Diabetes Sister   . Colon cancer Neg Hx       Review of Systems    General:  No chills, fever, night sweats or weight changes.  Cardiovascular:  No edema, orthopnea,  paroxysmal nocturnal dyspnea. Positive for chest pain and palpitations.  Dermatological: No rash, lesions/masses Respiratory: No cough, dyspnea Urologic: No hematuria, dysuria Abdominal:   No nausea, vomiting, diarrhea, bright red blood per rectum, melena, or hematemesis Neurologic:  No visual changes, wkns, changes in mental status. All other systems reviewed and are otherwise negative except as noted above.  Physical Exam/Data    Vitals:   05/12/18 1907 05/12/18 2010 05/12/18 2111 05/13/18 0644  BP: (!) 171/84  (!) 178/81 132/61  Pulse: 89  98 92  Resp: 16  18   Temp: 97.6 F (36.4 C)  97.8 F (36.6 C) 98.3 F (36.8 C)  TempSrc: Oral  Oral Oral  SpO2: 98%  99% 100%  Weight:      Height:  5\' 5"  (1.651 m)      Intake/Output Summary (Last 24 hours) at 05/13/2018 0825 Last data filed at 05/13/2018 0400 Gross per 24 hour  Intake 113.89 ml  Output -  Net 113.89 ml   Filed Weights   05/12/18 1332  Weight: 185 lb (83.9 kg)   Body mass index is 30.79 kg/m.   General: Pleasant, African American male appearing in NAD Psych: Normal affect. Neuro: Alert and oriented X 3. Moves all extremities spontaneously. HEENT: Normal  Neck: Supple without bruits or JVD. Lungs:  Resp regular and unlabored, CTA without wheezing or rales. Heart: RRR no s3, s4, or murmurs. Abdomen: Soft, non-tender, non-distended, BS + x 4.  Extremities: No clubbing, cyanosis or edema. DP/PT/Radials 2+ and equal bilaterally.   EKG:  The EKG was personally reviewed and demonstrates: Sinus tachycardia, HR 103, with diffuse T wave inversion along inferolateral leads which is similar to prior tracings but more prominent at this time.  Telemetry:  Telemetry was personally reviewed and demonstrates: NSR with  occasional PAC's and  PVC's. One episode of sinus tachycardia with HR in the 110's. No significant arrhythmias.    Labs/Studies     Relevant CV Studies:  Echocardiogram: 06/2017 Study Conclusions  - Left ventricle: The cavity size was normal. Wall thickness was   increased in a pattern of mild LVH. Systolic function was normal.   The estimated ejection fraction was in the range of 60% to 65%.   Wall motion was normal; there were no regional wall motion   abnormalities. Doppler parameters are consistent with abnormal   left ventricular relaxation (grade 1 diastolic dysfunction). - Aortic valve: Trileaflet; mildly calcified leaflets. - Mitral valve: Mildly calcified annulus. There was trivial   regurgitation. - Right atrium: Central venous pressure (est): 3 mm Hg. - Atrial septum: No defect or patent foramen ovale was identified. - Tricuspid valve: There was mild regurgitation. - Pulmonary arteries: PA peak pressure: 42 mm Hg (S). - Pericardium, extracardiac: There was no pericardial effusion.  Impressions:  - Mild LVH with LVEF 60-65% and grade 1 diastolic dysfunction.   Mildly calcified mitral annulus with trivial mitral   regurgitation. Mildly sclerotic aortic valve. Mild tricuspid   regurgitation with PASP estimated 42 mmHg.   NST: 10/2015  No diagnostic ST segment changes to indicate ischemia.  Trivial region of possible anteroapical ischemia noted. No large ischemic territories or scar however.  This is a low risk study.  Nuclear stress EF: 58%.  Laboratory Data:  Chemistry Recent Labs  Lab 05/11/18 1037 05/11/18 1428 05/12/18 1407  NA 133* 142 137  K 6.0* 4.3 4.8  CL 103 108 109  CO2 20*  --  20*  GLUCOSE 217* 108* 138*  BUN 31* 28* 28*  CREATININE 2.78* 2.60* 2.78*  CALCIUM 8.8*  --  9.0  GFRNONAA 20*  --  20*  GFRAA 23*  --  23*  ANIONGAP 10  --  8    Recent Labs  Lab 05/11/18 1037  PROT 7.3  ALBUMIN 3.6  AST 41  ALT 28  ALKPHOS 74  BILITOT 1.0    Hematology Recent Labs  Lab 05/11/18 1037 05/11/18 1428 05/12/18 1407 05/13/18 0731  WBC 5.9  --  6.6 5.8  RBC 4.07*  --  4.03* 3.85*  HGB 10.6* 10.9* 10.7* 10.2*  HCT 32.1* 32.0* 31.7* 30.4*  MCV 78.9  --  78.7 79.0  MCH 26.0  --  26.6 26.5  MCHC 33.0  --  33.8 33.6  RDW 13.8  --  13.9 13.9  PLT 186  --  189 181   Cardiac Enzymes Recent Labs  Lab 05/11/18 1037 05/12/18 1407 05/12/18 2013 05/13/18 0113  TROPONINI <0.03 0.03* 0.03* 0.03*   No results for input(s): TROPIPOC in the last 168 hours.  BNP Recent Labs  Lab 05/11/18 1049  BNP 91.0    DDimer No results for input(s): DDIMER in the last 168 hours.  Radiology/Studies:  Dg Chest 2 View  Result Date: 05/12/2018 CLINICAL DATA:  Chest pain with shortness of breath EXAM: CHEST - 2 VIEW COMPARISON:  05/11/2018, 04/30/2018, 03/25/2018 FINDINGS: Cardiomegaly. Minimal basilar atelectasis. No pleural effusion. Aortic atherosclerosis. No pneumothorax. IMPRESSION: No active cardiopulmonary disease. Cardiomegaly with minimal basilar atelectasis Electronically Signed   By: Donavan Foil M.D.   On: 05/12/2018 17:12   Dg Chest 2 View  Result Date: 05/11/2018 CLINICAL DATA:  Chest pain. EXAM: CHEST - 2 VIEW COMPARISON:  Radiographs of April 30, 2018. FINDINGS: The heart size and mediastinal contours are within  normal limits. Both lungs are clear. No pneumothorax or pleural effusion is noted. The visualized skeletal structures are unremarkable. IMPRESSION: No active cardiopulmonary disease. Electronically Signed   By: Marijo Conception, M.D.   On: 05/11/2018 10:54     Assessment & Plan    1. Chest Pain with Mixed Typical and Atypical Features - He reports intermittent episodes of chest discomfort over the past 4 weeks which can occur at rest or with activity and typically lasts for several minutes and then spontaneously resolves. Does report intermittent associated palpitations but symptoms sometimes occur without this.   Respiratory status has been at baseline. - his EKG does show diffuse T wave inversion along inferolateral leads which is similar to prior tracings but more prominent. Cyclic troponin values have been flat at 0.03 (will stop Heparin due to the flat trend and in the setting of his anemia).  - His last ischemic evaluation was an NST in 10/2015 which showed a trivial region of possible ischemia with no large ischemic territories and was overall a low risk study. He does have multiple cardiac risk factors including HTN, HLD, Type 2 DM, and family history of CAD.  - Will plan for a repeat Lexiscan Myoview this morning. He has been NPO since midnight. Would not plan to pursue a cardiac catheterization unless very high risk given his Stage IV CKD and the high risk for contrast-induced nephropathy. Medical management would be preferred. - continue ASA and statin therapy. Add BB as outlined below.   2. Palpitations - He reports intermittent palpitations over the past several months and notes his episodes of chest tightness sometimes occurs simultaneously. He has been noted to have occasional PAC's and PVC's by review of telemetry with no significant arrhythmias. Electrolytes have been within normal limits this admission. Will add-on TSH to AM labs.  - Plan to start low-dose Lopressor 12.5 mg twice daily which can be further titrated as HR and BP allows. If symptoms persist, would consider an event monitor as an outpatient.  3. HTN - BP was initially elevated to 173/67 at the time of admission, improved to 132/61 on most recent check. Continue Hydralazine 50 mg TID and Procardia-XL 90 mg daily. Will add low-dose beta-blocker as outlined above.  4. HLD - FLP pending. Followed by PCP. Remains on Pravastatin 40mg  daily.   5. Type 2 DM - Hgb A1c pending (at 7.3 in 06/2017). Fasting glucose at 126 this AM.  - per admitting team.   6. Stage 4 CKD/ Anemia of Chronic Disease - creatinine at 2.78 upon admission  which is close to baseline. Followed by Dr. Lowanda Foster as an outpatient.  - Hgb at 10.2. He denies any evidence of active bleeding.    For questions or updates, please contact [place] Please consult [email or web address] for contact info under Cardiology/STEMI.  Signed, Erma Heritage, PA-C 05/13/2018, 8:25 AM Pager: [phone]  Attending note Patient seen and discussed with PA Ahmed Prima, I agree with her documentation above. 82 yo male history of HTN, DM2, CKD IV admitted with chest pain and palpitations   K 4.8 Cr 2.78 BUN 28 CrCl 21 WBC 6.6 Hgb 10.7 Plt 189  Trop 0.03-->0.03-->0.03 CXR no acute process EKG SR, inferior and lateral precordial TWIs   Patient presents with chest pain and palpitations. Multiple CAD risk factors. EKG with inferior and lateral TWIs, very mild nonspecifc trop of 0.03. We will further risk stratify him today for ischemic heart disease with a lexicscan and echocardiogram. His CKD IV  is limiting in regards to invasive evaluation, would have to be very high risk findings to even consider. If low or intermediate risk would plan on medical therapy alone. Currently on ASA, lopressor, pravastatin. No ACE/ARB due to poor renal function. Further recs pending todays cardiac studies.    Carlyle Dolly MD

## 2018-05-13 NOTE — Discharge Instructions (Signed)
SEE PRIMARY MD IN ONE TO TWO WEEKS LABS    CBC , BMP IN ONE WEEK KEEP CARDIOLOGY APPT

## 2018-05-13 NOTE — Discharge Summary (Signed)
Physician Discharge Summary  Steve [name] QQV:[id number] DOB: 1936/08/17 DOA: 05/12/2018  PCP: Rosita Fire, MD  Admit date: 05/12/2018 Discharge date: 05/13/2018  Admitted From: Home Disposition: Home  Recommendations for Outpatient Follow-up:  1. Follow up with PCP in 1-2 weeks 2. Please obtain BMP/CBC in one week 3. Follow-up with cardiology in 3 weeks   Discharge Condition: Stable CODE STATUS: Full code Diet recommendation: Heart healthy, carb modified  Brief/Interim Summary: 82 year old male with history of hypertension, diabetes, chronic kidney disease stage IV, admitted to the hospital with complaints of chest discomfort and shortness of breath.  To have multiple CAD risk factors.  EKG showed inferior and lateral T wave inversions, he also had very mild nonspecific troponin of 0.03.  He was admitted to the hospital overnight and monitored on telemetry.  He was empirically started on intravenous heparin.  He did not have any significant rise of troponin.  He underwent stress testing that was found to be a low risk study.  Echo showed preserved ejection fraction.  It was recommended to continue the patient on beta-blockers and his chronic medications including hydralazine, nifedipine, aspirin and statin.  He is not having any further symptoms at this time.  He will follow-up with cardiology in the next 3 weeks.  Discharge Diagnoses:  Principal Problem:   CHEST PAIN-UNSPECIFIED Active Problems:   HTN (hypertension)   Prostate cancer ([place])   DM (diabetes mellitus), type 2 with renal complications (HCC)   Chest pain   CKD (chronic kidney disease), stage IV [facility])    Discharge Instructions  Discharge Instructions    Diet - low sodium heart healthy   Complete by:  As directed    Increase activity slowly   Complete by:  As directed      Allergies as of 05/13/2018   No Known Allergies     Medication List    TAKE these medications   acetaminophen 650 MG CR tablet Commonly  known as:  TYLENOL Take 650 mg by mouth every 8 (eight) hours as needed for pain.   albuterol 108 (90 Base) MCG/ACT inhaler Commonly known as:  PROVENTIL HFA;VENTOLIN HFA Inhale 1-2 puffs into the lungs every 6 (six) hours as needed for wheezing or shortness of breath.   aspirin 81 MG tablet Take 81 mg by mouth daily.   diazepam 5 MG tablet Commonly known as:  VALIUM Take 5 mg by mouth 2 (two) times daily as needed (for pelvic pain).   docusate sodium 100 MG capsule Commonly known as:  COLACE Take 100 mg by mouth daily.   gabapentin 300 MG capsule Commonly known as:  NEURONTIN Take 1 capsule by mouth 2 (two) times daily.   hydrALAZINE 25 MG tablet Commonly known as:  APRESOLINE Take 2 tablets (50 mg total) by mouth 3 (three) times daily.   Lysine 500 MG Caps Take 500 mg by mouth daily.   metoprolol tartrate 25 MG tablet Commonly known as:  LOPRESSOR Take 0.5 tablets (12.5 mg total) by mouth 2 (two) times daily.   NIFEdipine 90 MG 24 hr tablet Commonly known as:  PROCARDIA XL/ADALAT-CC Take 1 tablet (90 mg total) by mouth daily.   omeprazole 20 MG capsule Commonly known as:  PRILOSEC Take 1 capsule by mouth 2 (two) times daily.   pravastatin 40 MG tablet Commonly known as:  PRAVACHOL Take 1 tablet by mouth every evening.   tamsulosin 0.4 MG Caps capsule Commonly known as:  FLOMAX Take 1 capsule by mouth 2 (two) times daily.  TRADJENTA 5 MG Tabs tablet Generic drug:  linagliptin Take 5 mg by mouth daily.   Vitamin D-3 1000 units Caps Take 1 capsule by mouth once a week.      Follow-up Information    Erma Heritage, PA-C Follow up on 06/15/2018.   Specialties:  Physician Assistant, Cardiology Why:  [place] on 06/15/2018 at 2:30 PM with Bernerd Pho, PA-C (works with Dr. Bronson Ing). Appointment will be at the [facility].  Contact information: [address] [place] [address] [phone]          No Known  Allergies  Consultations:  Cardiology   Procedures/Studies: Dg Chest 2 View  Result Date: 05/12/2018 CLINICAL DATA:  Chest pain with shortness of breath EXAM: CHEST - 2 VIEW COMPARISON:  05/11/2018, 04/30/2018, 03/25/2018 FINDINGS: Cardiomegaly. Minimal basilar atelectasis. No pleural effusion. Aortic atherosclerosis. No pneumothorax. IMPRESSION: No active cardiopulmonary disease. Cardiomegaly with minimal basilar atelectasis Electronically Signed   By: Donavan Foil M.D.   On: 05/12/2018 17:12   Dg Chest 2 View  Result Date: 05/11/2018 CLINICAL DATA:  Chest pain. EXAM: CHEST - 2 VIEW COMPARISON:  Radiographs of April 30, 2018. FINDINGS: The heart size and mediastinal contours are within normal limits. Both lungs are clear. No pneumothorax or pleural effusion is noted. The visualized skeletal structures are unremarkable. IMPRESSION: No active cardiopulmonary disease. Electronically Signed   By: Marijo Conception, M.D.   On: 05/11/2018 10:54   Nm Myocar Multi W/spect W/wall Motion / Ef  Result Date: 05/13/2018  There was no ST segment deviation noted during stress.  The study is normal. There are no perfusion defects  This is a low risk study.  The left ventricular ejection fraction is hyperdynamic (>65%).       Subjective: No chest pain or shortness of breath  Discharge Exam: Vitals:   05/13/18 0644 05/13/18 1018  BP: 132/61 (!) 148/82  Pulse: 92 (!) 105  Resp:  18  Temp: 98.3 F (36.8 C) 97.8 F (36.6 C)  SpO2: 100% 100%   Vitals:   05/12/18 2010 05/12/18 2111 05/13/18 0644 05/13/18 1018  BP:  (!) 178/81 132/61 (!) 148/82  Pulse:  98 92 (!) 105  Resp:  18  18  Temp:  97.8 F (36.6 C) 98.3 F (36.8 C) 97.8 F (36.6 C)  TempSrc:  Oral Oral Oral  SpO2:  99% 100% 100%  Weight:      Height: 5\' 5"  (1.651 m)       General: Pt is alert, awake, not in acute distress Cardiovascular: RRR, S1/S2 +, no rubs, no gallops Respiratory: CTA bilaterally, no wheezing, no  rhonchi Abdominal: Soft, NT, ND, bowel sounds + Extremities: no edema, no cyanosis    The results of significant diagnostics from this hospitalization (including imaging, microbiology, ancillary and laboratory) are listed below for reference.     Microbiology: No results found for this or any previous visit (from the past 240 hour(s)).   Labs: BNP (last 3 results) Recent Labs    05/11/18 1049  BNP 54.5   Basic Metabolic Panel: Recent Labs  Lab 05/11/18 1037 05/11/18 1049 05/11/18 1428 05/12/18 1407  NA 133*  --  142 137  K 6.0*  --  4.3 4.8  CL 103  --  108 109  CO2 20*  --   --  20*  GLUCOSE 217*  --  108* 138*  BUN 31*  --  28* 28*  CREATININE 2.78*  --  2.60* 2.78*  CALCIUM 8.8*  --   --  9.0  MG  --  2.6*  --   --    Liver Function Tests: Recent Labs  Lab 05/11/18 1037  AST 41  ALT 28  ALKPHOS 74  BILITOT 1.0  PROT 7.3  ALBUMIN 3.6   No results for input(s): LIPASE, AMYLASE in the last 168 hours. No results for input(s): AMMONIA in the last 168 hours. CBC: Recent Labs  Lab 05/11/18 1037 05/11/18 1428 05/12/18 1407 05/13/18 0731  WBC 5.9  --  6.6 5.8  NEUTROABS 4.0  --   --   --   HGB 10.6* 10.9* 10.7* 10.2*  HCT 32.1* 32.0* 31.7* 30.4*  MCV 78.9  --  78.7 79.0  PLT 186  --  189 181   Cardiac Enzymes: Recent Labs  Lab 05/11/18 1037 05/12/18 1407 05/12/18 2013 05/13/18 0113  TROPONINI <0.03 0.03* 0.03* 0.03*   BNP: Invalid input(s): POCBNP CBG: Recent Labs  Lab 05/11/18 1312 05/12/18 2109 05/13/18 0754 05/13/18 1116  GLUCAP 206* 172* 126* 289*   D-Dimer No results for input(s): DDIMER in the last 72 hours. Hgb A1c No results for input(s): HGBA1C in the last 72 hours. Lipid Profile Recent Labs    05/13/18 0731  CHOL 144  HDL 52  LDLCALC 83  TRIG 46  CHOLHDL 2.8   Thyroid function studies Recent Labs    05/12/18 2013  TSH 1.416   Anemia work up No results for input(s): VITAMINB12, FOLATE, FERRITIN, TIBC, IRON,  RETICCTPCT in the last 72 hours. Urinalysis    Component Value Date/Time   COLORURINE STRAW (A) 05/11/2018 1151   APPEARANCEUR CLEAR 05/11/2018 1151   LABSPEC 1.004 (L) 05/11/2018 1151   PHURINE 6.0 05/11/2018 1151   GLUCOSEU 50 (A) 05/11/2018 1151   HGBUR NEGATIVE 05/11/2018 1151   BILIRUBINUR NEGATIVE 05/11/2018 1151   KETONESUR NEGATIVE 05/11/2018 1151   PROTEINUR 100 (A) 05/11/2018 1151   NITRITE NEGATIVE 05/11/2018 1151   LEUKOCYTESUR NEGATIVE 05/11/2018 1151   Sepsis Labs Invalid input(s): PROCALCITONIN,  WBC,  LACTICIDVEN Microbiology No results found for this or any previous visit (from the past 240 hour(s)).   Time coordinating discharge: 41mins  SIGNED:   Kathie Dike, MD  [facility] 05/13/2018, 12:13 PM Pager   If 7PM-7AM, please contact night-coverage [email or web address] Password [email or web address]

## 2018-05-13 NOTE — Progress Notes (Signed)
Has denied chest pain this morning.  Stress test completed.

## 2018-05-13 NOTE — Progress Notes (Signed)
ANTICOAGULATION CONSULT NOTE - Initial Consult  Pharmacy Consult for heparin Indication: ACS/STEMI  No Known Allergies  Patient Measurements: Height: 5\' 5"  (165.1 cm) Weight: 185 lb (83.9 kg) IBW/kg (Calculated) : 61.5 Heparin Dosing Weight: 79 kg  Vital Signs: Temp: 97.8 F (36.6 C) (07/31 2111) Temp Source: Oral (07/31 2111) BP: 178/81 (07/31 2111) Pulse Rate: 98 (07/31 2111)  Labs: Recent Labs    05/11/18 1037 05/11/18 1428 05/12/18 1407 05/12/18 2013 05/13/18 0113  HGB 10.6* 10.9* 10.7*  --   --   HCT 32.1* 32.0* 31.7*  --   --   PLT 186  --  189  --   --   HEPARINUNFRC  --   --   --   --  0.77*  CREATININE 2.78* 2.60* 2.78*  --   --   TROPONINI <0.03  --  0.03* 0.03* 0.03*    Estimated Creatinine Clearance: 21.1 mL/min (A) (by C-G formula based on SCr of 2.78 mg/dL (H)).   Medical History: Past Medical History:  Diagnosis Date  . Arthritis   . CKD (chronic kidney disease) stage 3, GFR 30-59 ml/min (HCC)   . Essential hypertension   . Prostate cancer ([place])   . Type 2 diabetes mellitus (HCC)     Medications:  Medications Prior to Admission  Medication Sig Dispense Refill Last Dose  . acetaminophen (TYLENOL) 650 MG CR tablet Take 650 mg by mouth every 8 (eight) hours as needed for pain.   Past Week at Unknown time  . albuterol (PROVENTIL HFA;VENTOLIN HFA) 108 (90 Base) MCG/ACT inhaler Inhale 1-2 puffs into the lungs every 6 (six) hours as needed for wheezing or shortness of breath.   Past Week at Unknown time  . aspirin 81 MG tablet Take 81 mg by mouth daily.   Past Week at Unknown time  . Cholecalciferol (VITAMIN D-3) 1000 UNITS CAPS Take 1 capsule by mouth once a week.    Past Week at Unknown time  . diazepam (VALIUM) 5 MG tablet Take 5 mg by mouth 2 (two) times daily as needed (for pelvic pain).   Past Week at Unknown time  . docusate sodium (COLACE) 100 MG capsule Take 100 mg by mouth daily.    Past Week at Unknown time  . gabapentin (NEURONTIN) 300 MG  capsule Take 1 capsule by mouth 2 (two) times daily.   Past Week at Unknown time  . hydrALAZINE (APRESOLINE) 25 MG tablet Take 1 tablet (25 mg total) by mouth 3 (three) times daily. (Patient taking differently: Take 50 mg by mouth 3 (three) times daily. ) 90 tablet 6 Past Week at Unknown time  . linagliptin (TRADJENTA) 5 MG TABS tablet Take 5 mg by mouth daily.   Past Week at Unknown time  . Lysine 500 MG CAPS Take 500 mg by mouth daily.   Past Week at Unknown time  . NIFEdipine (PROCARDIA XL/ADALAT-CC) 90 MG 24 hr tablet Take 1 tablet (90 mg total) by mouth daily. 30 tablet 6 Past Week at Unknown time  . omeprazole (PRILOSEC) 20 MG capsule Take 1 capsule by mouth 2 (two) times daily.   Past Week at Unknown time  . pravastatin (PRAVACHOL) 40 MG tablet Take 1 tablet by mouth every evening.    Past Week at Unknown time  . tamsulosin (FLOMAX) 0.4 MG CAPS Take 1 capsule by mouth 2 (two) times daily.    Past Week at Unknown time    Assessment: Pharmacy consulted to dose heparin in patient for  ACS/STEMI.  Patient has no prior to admission anticoagulants listed.  Goal of Therapy:  Heparin level 0.3-0.7 units/ml Monitor platelets by anticoagulation protocol: Yes   Plan:  Give 4000 units bolus x 1 Start heparin infusion at 1000 units/hr Check anti-Xa level in 8 hours and daily while on heparin Continue to monitor H&H and platelets   8/1@0200  HL 0.77, decrease heparin iv 1000units/hr to heparin iv 850units/hr and recheck level in 6 hours per protocol.   Jackye Dever 05/13/2018,2:07 AM

## 2018-05-19 DIAGNOSIS — E119 Type 2 diabetes mellitus without complications: Secondary | ICD-10-CM | POA: Diagnosis not present

## 2018-05-19 DIAGNOSIS — R079 Chest pain, unspecified: Secondary | ICD-10-CM | POA: Diagnosis not present

## 2018-05-19 DIAGNOSIS — N183 Chronic kidney disease, stage 3 (moderate): Secondary | ICD-10-CM | POA: Diagnosis not present

## 2018-05-19 DIAGNOSIS — E1129 Type 2 diabetes mellitus with other diabetic kidney complication: Secondary | ICD-10-CM | POA: Diagnosis not present

## 2018-05-19 DIAGNOSIS — M109 Gout, unspecified: Secondary | ICD-10-CM | POA: Diagnosis not present

## 2018-05-19 DIAGNOSIS — E1142 Type 2 diabetes mellitus with diabetic polyneuropathy: Secondary | ICD-10-CM | POA: Diagnosis not present

## 2018-05-19 DIAGNOSIS — I509 Heart failure, unspecified: Secondary | ICD-10-CM | POA: Diagnosis not present

## 2018-05-19 DIAGNOSIS — I10 Essential (primary) hypertension: Secondary | ICD-10-CM | POA: Diagnosis not present

## 2018-05-19 DIAGNOSIS — R809 Proteinuria, unspecified: Secondary | ICD-10-CM | POA: Diagnosis not present

## 2018-05-19 DIAGNOSIS — N342 Other urethritis: Secondary | ICD-10-CM | POA: Diagnosis not present

## 2018-05-19 DIAGNOSIS — E872 Acidosis: Secondary | ICD-10-CM | POA: Diagnosis not present

## 2018-05-19 DIAGNOSIS — N184 Chronic kidney disease, stage 4 (severe): Secondary | ICD-10-CM | POA: Diagnosis not present

## 2018-05-19 DIAGNOSIS — C61 Malignant neoplasm of prostate: Secondary | ICD-10-CM | POA: Diagnosis not present

## 2018-06-14 NOTE — Progress Notes (Signed)
Cardiology Office Note    Date:  06/15/2018   ID:  Peggye Form, DOB [birth year], MRN [id number]  PCP:  Rosita Fire, MD  Cardiologist: Kate Sable, MD    Chief Complaint  Patient presents with  . Hospitalization Follow-up    History of Present Illness:    Steve [name] is a 82 y.o. male with past medical history of HTN, HLD, Type 2 DM, prostate cancer (s/p radiation) and Stage 4 CKD who presents to the office today for hospital follow-up.   He was recently admitted to [facility] from 7/31 - 05/13/2018 for evaluation of chest pain. He reported having  intermittent chest tightness for the past 3-4 weeks and had recently been hospitalized in [place] several weeks ago for dehydration and says symptoms started after this.Initial and cyclic troponin values were flat at 0.03 and his EKG showed sinus tachycardia, HR 103, with diffuse TWI along inferolateral leads which was similar to prior tracings. Did report occasional palpitations and telemetry showed occasional PAC's and PVC's. Given his Stage 4 CKD, noninvasive evaluation was pursued with an echocardiogram and NST. Echocardiogram showed a preserved EF of 65-70%, Grade 1 DD, no regional WMA, trivial MR, and mild TR. NST showed no perfusion defects and was overall a low-risk study. No further testing was pursued and he was discharged home on his PTA medication regimen with the addition of Lopressor 12.5mg  BID for his frequent ectopy.    In talking with the patient today, he reports overall doing well from a cardiac perspective since his recent hospitalization. He denies any repeat episodes of chest pain. No recent dyspnea on exertion, orthopnea, PND, or lower extremity edema. Does report occasional palpitations when waking up from a "nightmare" but denies any other symptoms.  He has been following his blood pressure at home and says this has overall been well controlled. BP is at 124/74 during today's visit.   Past Medical  History:  Diagnosis Date  . Arthritis   . CKD (chronic kidney disease) stage 4, GFR 15-29 ml/min (HCC)   . Essential hypertension   . Prostate cancer ([place])   . Renal insufficiency   . Type 2 diabetes mellitus ([place])     Past Surgical History:  Procedure Laterality Date  . CATARACT EXTRACTION W/PHACO Right 04/28/2016   Procedure: CATARACT EXTRACTION PHACO AND INTRAOCULAR LENS PLACEMENT (IOC);  Surgeon: Williams Che, MD;  Location: [facility];  Service: Ophthalmology;  Laterality: Right;  CDE: 4.28  . COLONOSCOPY     about 2 years in [place]   . CYSTECTOMY      Current Medications: Outpatient Medications Prior to Visit  Medication Sig Dispense Refill  . acetaminophen (TYLENOL) 650 MG CR tablet Take 650 mg by mouth every 8 (eight) hours as needed for pain.    Marland Kitchen albuterol (PROVENTIL HFA;VENTOLIN HFA) 108 (90 Base) MCG/ACT inhaler Inhale 1-2 puffs into the lungs every 6 (six) hours as needed for wheezing or shortness of breath.    Marland Kitchen aspirin 81 MG tablet Take 81 mg by mouth daily.    . Cholecalciferol (VITAMIN D-3) 1000 UNITS CAPS Take 1 capsule by mouth once a week.     . diazepam (VALIUM) 5 MG tablet Take 5 mg by mouth 2 (two) times daily as needed (for pelvic pain).    Marland Kitchen docusate sodium (COLACE) 100 MG capsule Take 100 mg by mouth daily.     Marland Kitchen gabapentin (NEURONTIN) 300 MG capsule Take 1 capsule by mouth 2 (two) times daily.    Marland Kitchen  hydrALAZINE (APRESOLINE) 25 MG tablet Take 2 tablets (50 mg total) by mouth 3 (three) times daily.    Marland Kitchen linagliptin (TRADJENTA) 5 MG TABS tablet Take 5 mg by mouth daily.    Marland Kitchen Lysine 500 MG CAPS Take 500 mg by mouth daily.    Marland Kitchen NIFEdipine (PROCARDIA XL/ADALAT-CC) 90 MG 24 hr tablet Take 1 tablet (90 mg total) by mouth daily. 30 tablet 6  . omeprazole (PRILOSEC) 20 MG capsule Take 1 capsule by mouth 2 (two) times daily.    . pravastatin (PRAVACHOL) 40 MG tablet Take 1 tablet by mouth every evening.     . tamsulosin (FLOMAX) 0.4 MG CAPS Take 1 capsule by mouth 2  (two) times daily.     . metoprolol tartrate (LOPRESSOR) 25 MG tablet Take 0.5 tablets (12.5 mg total) by mouth 2 (two) times daily. 60 tablet 0   No facility-administered medications prior to visit.      Allergies:   Patient has no known allergies.   Social History   Socioeconomic History  . Marital status: Divorced    Spouse name: Not on file  . Number of children: Not on file  . Years of education: Not on file  . Highest education level: Not on file  Occupational History  . Occupation: Retired  Scientific laboratory technician  . Financial resource strain: Not on file  . Food insecurity:    Worry: Not on file    Inability: Not on file  . Transportation needs:    Medical: Not on file    Non-medical: Not on file  Tobacco Use  . Smoking status: Former Smoker    Packs/day: 2.00    Years: 25.00    Pack years: 50.00    Types: Pipe, Cigars    Start date: 11/08/1954    Last attempt to quit: 10/14/1987    Years since quitting: 30.6  . Smokeless tobacco: Never Used  . Tobacco comment: About 2 pipes a day  Substance and Sexual Activity  . Alcohol use: No    Alcohol/week: 0.0 standard drinks  . Drug use: No  . Sexual activity: Not on file  Lifestyle  . Physical activity:    Days per week: Not on file    Minutes per session: Not on file  . Stress: Not on file  Relationships  . Social connections:    Talks on phone: Not on file    Gets together: Not on file    Attends religious service: Not on file    Active member of club or organization: Not on file    Attends meetings of clubs or organizations: Not on file    Relationship status: Not on file  Other Topics Concern  . Not on file  Social History Narrative  . Not on file     Family History:  The patient's family history includes Diabetes in his sister; Heart disease in his mother.   Review of Systems:   Please see the history of present illness.     General:  No chills, fever, night sweats or weight changes.  Cardiovascular:  No  chest pain, dyspnea on exertion, edema, orthopnea, paroxysmal nocturnal dyspnea. Positive for palpitations.  Dermatological: No rash, lesions/masses Respiratory: No cough, dyspnea Urologic: No hematuria, dysuria Abdominal:   No nausea, vomiting, diarrhea, bright red blood per rectum, melena, or hematemesis Neurologic:  No visual changes, wkns, changes in mental status. All other systems reviewed and are otherwise negative except as noted above.   Physical Exam:  VS:  BP 124/74 (BP Location: Left Arm)   Pulse 64   Ht 5\' 5"  (1.651 m)   Wt 176 lb (79.8 kg)   SpO2 98%   BMI 29.29 kg/m    General: Well developed, well nourished [place] American male appearing in no acute distress. Head: Normocephalic, atraumatic, sclera non-icteric, no xanthomas, nares are without discharge.  Neck: No carotid bruits. JVD not elevated.  Lungs: Respirations regular and unlabored, without wheezes or rales.  Heart: Regular rate and rhythm. No S3 or S4.  No murmur, no rubs, or gallops appreciated. Abdomen: Soft, non-tender, non-distended with normoactive bowel sounds. No hepatomegaly. No rebound/guarding. No obvious abdominal masses. Msk:  Strength and tone appear normal for age. No joint deformities or effusions. Extremities: No clubbing or cyanosis. No lower extremity edema.  Distal pedal pulses are 2+ bilaterally. Neuro: Alert and oriented X 3. Moves all extremities spontaneously. No focal deficits noted. Psych:  Responds to questions appropriately with a normal affect. Skin: No rashes or lesions noted  Wt Readings from Last 3 Encounters:  06/15/18 176 lb (79.8 kg)  05/12/18 185 lb (83.9 kg)  05/11/18 185 lb (83.9 kg)     Studies/Labs Reviewed:   EKG:  EKG is not ordered today.   Recent Labs: 05/11/2018: ALT 28; B Natriuretic Peptide 91.0; Magnesium 2.6 05/12/2018: BUN 28; Creatinine, Ser 2.78; Potassium 4.8; Sodium 137; TSH 1.416 05/13/2018: Hemoglobin 10.2; Platelets 181   Lipid Panel      Component Value Date/Time   CHOL 144 05/13/2018 0731   TRIG 46 05/13/2018 0731   HDL 52 05/13/2018 0731   CHOLHDL 2.8 05/13/2018 0731   VLDL 9 05/13/2018 0731   LDLCALC 83 05/13/2018 0731    Additional studies/ records that were reviewed today include:   Echocardiogram: 05/13/2018 Study Conclusions  - Left ventricle: The cavity size was normal. Wall thickness was   increased in a pattern of moderate LVH. Systolic function was   vigorous. The estimated ejection fraction was in the range of 65%   to 70%. Wall motion was normal; there were no regional wall   motion abnormalities. Doppler parameters are consistent with   abnormal left ventricular relaxation (grade 1 diastolic   dysfunction). - Aortic valve: Mildly to moderately calcified annulus. Trileaflet;   moderately thickened leaflets. Valve area (VTI): 2.89 cm^2. Valve   area (Vmax): 2.66 cm^2. - Mitral valve: Mildly calcified annulus. Mildly thickened leaflets   . - Atrial septum: No defect or patent foramen ovale was identified. - Pulmonary arteries: Systolic pressure was moderately increased.   PA peak pressure: 56 mm Hg (S). - Technically adequate study.  NST: 05/13/2018  There was no ST segment deviation noted during stress.  The study is normal. There are no perfusion defects  This is a low risk study.  The left ventricular ejection fraction is hyperdynamic (>65%).  Assessment:    1. Atypical chest pain   2. PVC (premature ventricular contraction)   3. Essential hypertension   4. Hyperlipidemia, unspecified hyperlipidemia type   5. CKD (chronic kidney disease), stage IV ([place])      Plan:   In order of problems listed above:  1. Atypical Chest Pain  - recently admitted for evaluation of chest pain and cyclic troponin values were flat at 0.03 and his EKG showed diffuse TWI along inferolateral leads which was similar to prior tracings. Echocardiogram showed a preserved EF of 65-70%, Grade 1 DD, no regional  WMA, trivial MR, and mild TR. NST showed no  perfusion defects and was overall a low-risk study. - He denies any recurrent episodes of chest pain since hospital discharge. No indication for further ischemic evaluation at this time. Continue with risk factor modification.   2. PVC's - his palpitations have improved since being started on BB therapy. Would continue on Lopressor 12.5mg  BID.   3. HTN - BP is well-controlled at 124/74 during today's visit. - continue current medication regimen with Hydralazine, Lopressor, and Nifedipine.   4. HLD - Followed by PCP. FLP on 05/13/2018 showed total cholesterol of 144, HDL 52, and LDL 83. Remains on Pravastatin 40mg  daily.   5. Stage 4 CKD - creatinine at 2.78 in 04/2018 which is close to baseline. Followed by Dr. Lowanda Foster.    Medication Adjustments/Labs and Tests Ordered: Current medicines are reviewed at length with the patient today.  Concerns regarding medicines are outlined above.  Medication changes, Labs and Tests ordered today are listed in the Patient Instructions below. Patient Instructions  Medication Instructions:  Your physician recommends that you continue on your current medications as directed. Please refer to the Current Medication list given to you today.  May take an extra Lopressor for palpitations.   Labwork: NONE   Testing/Procedures: NONE   Follow-Up: Your physician wants you to follow-up in: 6 Months with Dr. Bronson Ing. You will receive a reminder letter in the mail two months in advance. If you don't receive a letter, please call our office to schedule the follow-up appointment.  Any Other Special Instructions Will Be Listed Below (If Applicable).  If you need a refill on your cardiac medications before your next appointment, please call your pharmacy. Thank you for choosing [facility]!     Signed, Steve Heritage, PA-C  06/15/2018 8:26 PM    [place] [address]  [place],  [address] Phone: [phone]

## 2018-06-15 ENCOUNTER — Ambulatory Visit (INDEPENDENT_AMBULATORY_CARE_PROVIDER_SITE_OTHER): Payer: Medicare HMO | Admitting: Student

## 2018-06-15 ENCOUNTER — Encounter: Payer: Self-pay | Admitting: Student

## 2018-06-15 VITALS — BP 124/74 | HR 64 | Ht 65.0 in | Wt 176.0 lb

## 2018-06-15 DIAGNOSIS — R0789 Other chest pain: Secondary | ICD-10-CM

## 2018-06-15 DIAGNOSIS — E785 Hyperlipidemia, unspecified: Secondary | ICD-10-CM

## 2018-06-15 DIAGNOSIS — I493 Ventricular premature depolarization: Secondary | ICD-10-CM | POA: Diagnosis not present

## 2018-06-15 DIAGNOSIS — I10 Essential (primary) hypertension: Secondary | ICD-10-CM

## 2018-06-15 DIAGNOSIS — N184 Chronic kidney disease, stage 4 (severe): Secondary | ICD-10-CM | POA: Diagnosis not present

## 2018-06-15 MED ORDER — METOPROLOL TARTRATE 25 MG PO TABS: 12.5000 mg | ORAL_TABLET | Freq: Two times a day (BID) | ORAL | 3 refills | Status: DC

## 2018-06-15 NOTE — Patient Instructions (Addendum)
Medication Instructions:  Your physician recommends that you continue on your current medications as directed. Please refer to the Current Medication list given to you today.  May take an extra Lopressor for palpitations.   Labwork: NONE   Testing/Procedures: NONE   Follow-Up: Your physician wants you to follow-up in: 6 Months with Dr. Bronson Ing. You will receive a reminder letter in the mail two months in advance. If you don't receive a letter, please call our office to schedule the follow-up appointment.   Any Other Special Instructions Will Be Listed Below (If Applicable).     If you need a refill on your cardiac medications before your next appointment, please call your pharmacy. Thank you for choosing [facility]!

## 2018-06-17 ENCOUNTER — Other Ambulatory Visit: Payer: Self-pay | Admitting: Cardiovascular Disease

## 2018-06-21 ENCOUNTER — Other Ambulatory Visit (HOSPITAL_COMMUNITY): Payer: Self-pay | Admitting: Internal Medicine

## 2018-06-21 ENCOUNTER — Ambulatory Visit (HOSPITAL_COMMUNITY)
Admission: RE | Admit: 2018-06-21 | Discharge: 2018-06-21 | Disposition: A | Discharge status: Home / Self Care | Payer: Medicare HMO | Source: Ambulatory Visit | Attending: Internal Medicine | Admitting: Internal Medicine

## 2018-06-21 DIAGNOSIS — I10 Essential (primary) hypertension: Secondary | ICD-10-CM | POA: Diagnosis not present

## 2018-06-21 DIAGNOSIS — J42 Unspecified chronic bronchitis: Secondary | ICD-10-CM

## 2018-06-21 DIAGNOSIS — N184 Chronic kidney disease, stage 4 (severe): Secondary | ICD-10-CM | POA: Diagnosis not present

## 2018-06-21 DIAGNOSIS — I517 Cardiomegaly: Secondary | ICD-10-CM | POA: Diagnosis not present

## 2018-06-21 DIAGNOSIS — R05 Cough: Secondary | ICD-10-CM | POA: Diagnosis not present

## 2018-06-21 DIAGNOSIS — J984 Other disorders of lung: Secondary | ICD-10-CM | POA: Diagnosis not present

## 2018-06-21 DIAGNOSIS — I129 Hypertensive chronic kidney disease with stage 1 through stage 4 chronic kidney disease, or unspecified chronic kidney disease: Secondary | ICD-10-CM | POA: Diagnosis not present

## 2018-06-21 DIAGNOSIS — E1142 Type 2 diabetes mellitus with diabetic polyneuropathy: Secondary | ICD-10-CM | POA: Diagnosis not present

## 2018-07-07 DIAGNOSIS — D509 Iron deficiency anemia, unspecified: Secondary | ICD-10-CM | POA: Diagnosis not present

## 2018-07-07 DIAGNOSIS — Z79899 Other long term (current) drug therapy: Secondary | ICD-10-CM | POA: Diagnosis not present

## 2018-07-07 DIAGNOSIS — N183 Chronic kidney disease, stage 3 (moderate): Secondary | ICD-10-CM | POA: Diagnosis not present

## 2018-07-07 DIAGNOSIS — E559 Vitamin D deficiency, unspecified: Secondary | ICD-10-CM | POA: Diagnosis not present

## 2018-07-07 DIAGNOSIS — R809 Proteinuria, unspecified: Secondary | ICD-10-CM | POA: Diagnosis not present

## 2018-07-07 DIAGNOSIS — I129 Hypertensive chronic kidney disease with stage 1 through stage 4 chronic kidney disease, or unspecified chronic kidney disease: Secondary | ICD-10-CM | POA: Diagnosis not present

## 2018-07-09 DIAGNOSIS — R0602 Shortness of breath: Secondary | ICD-10-CM | POA: Diagnosis not present

## 2018-07-09 DIAGNOSIS — E1142 Type 2 diabetes mellitus with diabetic polyneuropathy: Secondary | ICD-10-CM | POA: Diagnosis not present

## 2018-07-09 DIAGNOSIS — N184 Chronic kidney disease, stage 4 (severe): Secondary | ICD-10-CM | POA: Diagnosis not present

## 2018-07-09 DIAGNOSIS — I10 Essential (primary) hypertension: Secondary | ICD-10-CM | POA: Diagnosis not present

## 2018-07-09 DIAGNOSIS — Z23 Encounter for immunization: Secondary | ICD-10-CM | POA: Diagnosis not present

## 2018-07-13 DIAGNOSIS — I10 Essential (primary) hypertension: Secondary | ICD-10-CM | POA: Diagnosis not present

## 2018-07-13 DIAGNOSIS — E872 Acidosis: Secondary | ICD-10-CM | POA: Diagnosis not present

## 2018-07-13 DIAGNOSIS — N184 Chronic kidney disease, stage 4 (severe): Secondary | ICD-10-CM | POA: Diagnosis not present

## 2018-07-13 DIAGNOSIS — R809 Proteinuria, unspecified: Secondary | ICD-10-CM | POA: Diagnosis not present

## 2018-07-13 DIAGNOSIS — I509 Heart failure, unspecified: Secondary | ICD-10-CM | POA: Diagnosis not present

## 2018-07-13 DIAGNOSIS — E1129 Type 2 diabetes mellitus with other diabetic kidney complication: Secondary | ICD-10-CM | POA: Diagnosis not present

## 2018-07-20 ENCOUNTER — Ambulatory Visit: Payer: Medicare HMO | Admitting: Urology

## 2018-07-20 DIAGNOSIS — Z8546 Personal history of malignant neoplasm of prostate: Secondary | ICD-10-CM | POA: Diagnosis not present

## 2018-07-24 ENCOUNTER — Emergency Department (HOSPITAL_COMMUNITY): Payer: Medicare Other

## 2018-07-24 ENCOUNTER — Encounter (HOSPITAL_COMMUNITY): Payer: Self-pay | Admitting: Emergency Medicine

## 2018-07-24 ENCOUNTER — Inpatient Hospital Stay (HOSPITAL_COMMUNITY)
Admission: EM | Admit: 2018-07-24 | Discharge: 2018-07-26 | DRG: 683 | Disposition: A | Discharge status: Home Health | Payer: Medicare Other | Attending: Internal Medicine | Admitting: Internal Medicine

## 2018-07-24 ENCOUNTER — Other Ambulatory Visit: Payer: Self-pay

## 2018-07-24 DIAGNOSIS — I25119 Atherosclerotic heart disease of native coronary artery with unspecified angina pectoris: Secondary | ICD-10-CM | POA: Diagnosis not present

## 2018-07-24 DIAGNOSIS — G3189 Other specified degenerative diseases of nervous system: Secondary | ICD-10-CM | POA: Diagnosis present

## 2018-07-24 DIAGNOSIS — Z9841 Cataract extraction status, right eye: Secondary | ICD-10-CM | POA: Diagnosis not present

## 2018-07-24 DIAGNOSIS — Z79899 Other long term (current) drug therapy: Secondary | ICD-10-CM | POA: Diagnosis not present

## 2018-07-24 DIAGNOSIS — N184 Chronic kidney disease, stage 4 (severe): Secondary | ICD-10-CM | POA: Diagnosis present

## 2018-07-24 DIAGNOSIS — Z7982 Long term (current) use of aspirin: Secondary | ICD-10-CM

## 2018-07-24 DIAGNOSIS — Z87891 Personal history of nicotine dependence: Secondary | ICD-10-CM | POA: Diagnosis not present

## 2018-07-24 DIAGNOSIS — S299XXA Unspecified injury of thorax, initial encounter: Secondary | ICD-10-CM | POA: Diagnosis not present

## 2018-07-24 DIAGNOSIS — I6523 Occlusion and stenosis of bilateral carotid arteries: Secondary | ICD-10-CM | POA: Diagnosis present

## 2018-07-24 DIAGNOSIS — I129 Hypertensive chronic kidney disease with stage 1 through stage 4 chronic kidney disease, or unspecified chronic kidney disease: Secondary | ICD-10-CM | POA: Diagnosis not present

## 2018-07-24 DIAGNOSIS — E1169 Type 2 diabetes mellitus with other specified complication: Secondary | ICD-10-CM | POA: Diagnosis present

## 2018-07-24 DIAGNOSIS — W010XXA Fall on same level from slipping, tripping and stumbling without subsequent striking against object, initial encounter: Secondary | ICD-10-CM | POA: Diagnosis present

## 2018-07-24 DIAGNOSIS — I672 Cerebral atherosclerosis: Secondary | ICD-10-CM | POA: Diagnosis present

## 2018-07-24 DIAGNOSIS — Y92003 Bedroom of unspecified non-institutional (private) residence as the place of occurrence of the external cause: Secondary | ICD-10-CM

## 2018-07-24 DIAGNOSIS — R29818 Other symptoms and signs involving the nervous system: Secondary | ICD-10-CM | POA: Diagnosis not present

## 2018-07-24 DIAGNOSIS — Z833 Family history of diabetes mellitus: Secondary | ICD-10-CM | POA: Diagnosis not present

## 2018-07-24 DIAGNOSIS — Z8546 Personal history of malignant neoplasm of prostate: Secondary | ICD-10-CM

## 2018-07-24 DIAGNOSIS — W19XXXA Unspecified fall, initial encounter: Secondary | ICD-10-CM

## 2018-07-24 DIAGNOSIS — Y92009 Unspecified place in unspecified non-institutional (private) residence as the place of occurrence of the external cause: Secondary | ICD-10-CM | POA: Diagnosis not present

## 2018-07-24 DIAGNOSIS — M25512 Pain in left shoulder: Secondary | ICD-10-CM | POA: Diagnosis not present

## 2018-07-24 DIAGNOSIS — I700 Atherosclerosis of aorta: Secondary | ICD-10-CM | POA: Diagnosis not present

## 2018-07-24 DIAGNOSIS — R079 Chest pain, unspecified: Secondary | ICD-10-CM | POA: Diagnosis not present

## 2018-07-24 DIAGNOSIS — M549 Dorsalgia, unspecified: Secondary | ICD-10-CM | POA: Diagnosis present

## 2018-07-24 DIAGNOSIS — Z8249 Family history of ischemic heart disease and other diseases of the circulatory system: Secondary | ICD-10-CM | POA: Diagnosis not present

## 2018-07-24 DIAGNOSIS — R29701 NIHSS score 1: Secondary | ICD-10-CM | POA: Diagnosis not present

## 2018-07-24 DIAGNOSIS — K5730 Diverticulosis of large intestine without perforation or abscess without bleeding: Secondary | ICD-10-CM | POA: Diagnosis not present

## 2018-07-24 DIAGNOSIS — Z794 Long term (current) use of insulin: Secondary | ICD-10-CM

## 2018-07-24 DIAGNOSIS — M1990 Unspecified osteoarthritis, unspecified site: Secondary | ICD-10-CM | POA: Diagnosis not present

## 2018-07-24 DIAGNOSIS — E785 Hyperlipidemia, unspecified: Secondary | ICD-10-CM | POA: Diagnosis not present

## 2018-07-24 DIAGNOSIS — S199XXA Unspecified injury of neck, initial encounter: Secondary | ICD-10-CM | POA: Diagnosis not present

## 2018-07-24 DIAGNOSIS — Z961 Presence of intraocular lens: Secondary | ICD-10-CM | POA: Diagnosis not present

## 2018-07-24 DIAGNOSIS — M546 Pain in thoracic spine: Secondary | ICD-10-CM | POA: Diagnosis not present

## 2018-07-24 DIAGNOSIS — R911 Solitary pulmonary nodule: Secondary | ICD-10-CM | POA: Diagnosis not present

## 2018-07-24 DIAGNOSIS — M6281 Muscle weakness (generalized): Secondary | ICD-10-CM | POA: Diagnosis not present

## 2018-07-24 DIAGNOSIS — K589 Irritable bowel syndrome without diarrhea: Secondary | ICD-10-CM | POA: Diagnosis present

## 2018-07-24 DIAGNOSIS — R531 Weakness: Secondary | ICD-10-CM | POA: Diagnosis present

## 2018-07-24 DIAGNOSIS — R2681 Unsteadiness on feet: Secondary | ICD-10-CM | POA: Diagnosis present

## 2018-07-24 DIAGNOSIS — R590 Localized enlarged lymph nodes: Secondary | ICD-10-CM | POA: Diagnosis not present

## 2018-07-24 DIAGNOSIS — I10 Essential (primary) hypertension: Secondary | ICD-10-CM | POA: Diagnosis present

## 2018-07-24 DIAGNOSIS — E1122 Type 2 diabetes mellitus with diabetic chronic kidney disease: Secondary | ICD-10-CM | POA: Diagnosis not present

## 2018-07-24 DIAGNOSIS — E1129 Type 2 diabetes mellitus with other diabetic kidney complication: Secondary | ICD-10-CM | POA: Diagnosis present

## 2018-07-24 HISTORY — DX: Hyperlipidemia, unspecified: E78.5

## 2018-07-24 LAB — CBC
HCT: 36.2 % — ABNORMAL LOW (ref 39.0–52.0)
Hemoglobin: 11.8 g/dL — ABNORMAL LOW (ref 13.0–17.0)
MCH: 25.5 pg — ABNORMAL LOW (ref 26.0–34.0)
MCHC: 32.6 g/dL (ref 30.0–36.0)
MCV: 78.4 fL — ABNORMAL LOW (ref 80.0–100.0)
Platelets: 227 10*3/uL (ref 150–400)
RBC: 4.62 MIL/uL (ref 4.22–5.81)
RDW: 12.7 % (ref 11.5–15.5)
WBC: 8.7 10*3/uL (ref 4.0–10.5)
nRBC: 0 % (ref 0.0–0.2)

## 2018-07-24 LAB — COMPREHENSIVE METABOLIC PANEL
ALT: 20 U/L (ref 0–44)
AST: 21 U/L (ref 15–41)
Albumin: 4 g/dL (ref 3.5–5.0)
Alkaline Phosphatase: 71 U/L (ref 38–126)
Anion gap: 9 (ref 5–15)
BUN: 33 mg/dL — ABNORMAL HIGH (ref 8–23)
CO2: 21 mmol/L — ABNORMAL LOW (ref 22–32)
Calcium: 9.2 mg/dL (ref 8.9–10.3)
Chloride: 105 mmol/L (ref 98–111)
Creatinine, Ser: 2.74 mg/dL — ABNORMAL HIGH (ref 0.61–1.24)
GFR calc Af Amer: 24 mL/min — ABNORMAL LOW (ref >60)
GFR calc non Af Amer: 20 mL/min — ABNORMAL LOW (ref >60)
Glucose, Bld: 152 mg/dL — ABNORMAL HIGH (ref 70–99)
Potassium: 4.2 mmol/L (ref 3.5–5.1)
Sodium: 135 mmol/L (ref 135–145)
Total Bilirubin: 0.5 mg/dL (ref 0.3–1.2)
Total Protein: 8 g/dL (ref 6.5–8.1)

## 2018-07-24 LAB — I-STAT CHEM 8, ED
BUN: 31 mg/dL — ABNORMAL HIGH (ref 8–23)
Calcium, Ion: 1.17 mmol/L (ref 1.15–1.40)
Chloride: 105 mmol/L (ref 98–111)
Creatinine, Ser: 3 mg/dL — ABNORMAL HIGH (ref 0.61–1.24)
Glucose, Bld: 151 mg/dL — ABNORMAL HIGH (ref 70–99)
HCT: 35 % — ABNORMAL LOW (ref 39.0–52.0)
Hemoglobin: 11.9 g/dL — ABNORMAL LOW (ref 13.0–17.0)
Potassium: 4.2 mmol/L (ref 3.5–5.1)
Sodium: 136 mmol/L (ref 135–145)
TCO2: 22 mmol/L (ref 22–32)

## 2018-07-24 LAB — DIFFERENTIAL
Abs Immature Granulocytes: 0.06 10*3/uL (ref 0.00–0.07)
Basophils Absolute: 0 10*3/uL (ref 0.0–0.1)
Basophils Relative: 0 %
Eosinophils Absolute: 0.1 10*3/uL (ref 0.0–0.5)
Eosinophils Relative: 1 %
Immature Granulocytes: 1 %
Lymphocytes Relative: 11 %
Lymphs Abs: 1 10*3/uL (ref 0.7–4.0)
Monocytes Absolute: 0.9 10*3/uL (ref 0.1–1.0)
Monocytes Relative: 11 %
Neutro Abs: 6.7 10*3/uL (ref 1.7–7.7)
Neutrophils Relative %: 76 %

## 2018-07-24 LAB — PROTIME-INR
INR: 0.96
Prothrombin Time: 12.7 seconds (ref 11.4–15.2)

## 2018-07-24 LAB — APTT: aPTT: 32 seconds (ref 24–36)

## 2018-07-24 LAB — ETHANOL: Alcohol, Ethyl (B): <10 mg/dL (ref <10)

## 2018-07-24 LAB — I-STAT TROPONIN, ED: Troponin i, poc: 0.03 ng/mL (ref 0.00–0.08)

## 2018-07-24 NOTE — ED Triage Notes (Signed)
Pt reports he was walking into his bedroom and tripped. Fell onto the floor and now c/o L shoulder and back pain. Denies LOC, hitting head, or blood thinner use.

## 2018-07-24 NOTE — ED Provider Notes (Addendum)
[facility] EMERGENCY DEPARTMENT Provider Note   CSN: [id number] Arrival date & time: 07/24/18  2217     History   Chief Complaint Chief Complaint  Patient presents with  . Fall    HPI Steve [name] is a 82 y.o. male.  Pt is an 82 y/o male who presents to the ED following a fall. The patient states he was going from his bedroom to the bathroom, only a few steps, and he got "tripped up" and fell. He had to stay on the floor for a while because he could not get up. His friend states that he was unstable on his feet and it was a problem to get him up from the floor. Pt denies hitting head or face. He c/o of pins and needles from the elbow to the shoulders bilat. He has weakness of the left hand and left of the left leg. He has pain of the left shoulder and the mid-back. No LOC. No loss of bowel or bladder function. Pt was last seen normal at about 10:00pm.  The history is provided by the patient and a friend.    Past Medical History:  Diagnosis Date  . Arthritis   . CKD (chronic kidney disease) stage 4, GFR 15-29 ml/min (HCC)   . Essential hypertension   . Prostate cancer ([place])   . Renal insufficiency   . Type 2 diabetes mellitus [facility])     Patient Active Problem List   Diagnosis Date Noted  . CKD (chronic kidney disease), stage IV ([place]) 05/13/2018  . Chest pain 05/12/2018  . Elevated troponin 07/02/2017  . DM (diabetes mellitus), type 2 with renal complications ([place]) 63/10/6008  . IBS (irritable bowel syndrome) 01/28/2017  . Prostate cancer ([place]) 07/10/2014  . Abnormal ECG 04/12/2013  . HTN (hypertension) 04/12/2013  . SOB (shortness of breath) 04/12/2013  . CHEST PAIN-UNSPECIFIED 06/12/2009    Past Surgical History:  Procedure Laterality Date  . CATARACT EXTRACTION W/PHACO Right 04/28/2016   Procedure: CATARACT EXTRACTION PHACO AND INTRAOCULAR LENS PLACEMENT (IOC);  Surgeon: Williams Che, MD;  Location: [facility];  Service: Ophthalmology;  Laterality: Right;  CDE:  4.28  . COLONOSCOPY     about 2 years in [place]   . CYSTECTOMY          Home Medications    Prior to Admission medications   Medication Sig Start Date End Date Taking? Authorizing Provider  albuterol (PROVENTIL HFA;VENTOLIN HFA) 108 (90 Base) MCG/ACT inhaler Inhale 1-2 puffs into the lungs every 6 (six) hours as needed for wheezing or shortness of breath.   Yes [provider]  aspirin 81 MG tablet Take 81 mg by mouth daily.   Yes [provider]  aspirin-acetaminophen-caffeine (EXCEDRIN MIGRAINE) [phone] MG tablet Take 2 tablets by mouth every 6 (six) hours as needed for headache.   Yes [provider]  Cholecalciferol (VITAMIN D-3) 1000 UNITS CAPS Take 1 capsule by mouth once a week.    Yes [provider]  docusate sodium (COLACE) 100 MG capsule Take 100 mg by mouth daily.    Yes [provider]  gabapentin (NEURONTIN) 300 MG capsule Take 1 capsule by mouth 2 (two) times daily. 01/19/15  Yes [provider]  hydrALAZINE (APRESOLINE) 25 MG tablet Take 2 tablets (50 mg total) by mouth 3 (three) times daily. 05/13/18 08/11/18 Yes Kathie Dike, MD  linagliptin (TRADJENTA) 5 MG TABS tablet Take 5 mg by mouth daily.   Yes [provider]  Lysine 500  MG CAPS Take 500 mg by mouth daily.   Yes [provider]  metoprolol tartrate (LOPRESSOR) 25 MG tablet Take 0.5 tablets (12.5 mg total) by mouth 2 (two) times daily. 06/15/18  Yes Strader, [place], PA-C  NIFEdipine (PROCARDIA XL/ADALAT-CC) 90 MG 24 hr tablet TAKE (1) TABLET BY MOUTH ONCE DAILY. Patient taking differently: Take 90 mg by mouth daily.  06/17/18  Yes Herminio Commons, MD  omeprazole (PRILOSEC) 20 MG capsule Take 1 capsule by mouth 2 (two) times daily. 04/06/13  Yes [provider]  pravastatin (PRAVACHOL) 40 MG tablet Take 1 tablet by mouth every evening.  04/06/13  Yes [provider]  tamsulosin (FLOMAX) 0.4 MG CAPS Take 1 capsule by mouth 2  (two) times daily.  04/06/13  Yes [provider]    Family History Family History  Problem Relation Age of Onset  . Heart disease Mother        cause of death  . Diabetes Sister   . Colon cancer Neg Hx     Social History Social History   Tobacco Use  . Smoking status: Former Smoker    Packs/day: 2.00    Years: 25.00    Pack years: 50.00    Types: Pipe, Cigars    Start date: 11/08/1954    Last attempt to quit: 10/14/1987    Years since quitting: 30.7  . Smokeless tobacco: Never Used  . Tobacco comment: About 2 pipes a day  Substance Use Topics  . Alcohol use: No    Alcohol/week: 0.0 standard drinks  . Drug use: No     Allergies   Patient has no known allergies.   Review of Systems Review of Systems  Constitutional: Negative for activity change.       All ROS Neg except as noted in HPI  HENT: Negative for nosebleeds.   Eyes: Negative for photophobia and discharge.  Respiratory: Negative for cough, shortness of breath and wheezing.   Cardiovascular: Negative for chest pain and palpitations.  Gastrointestinal: Negative for abdominal pain and blood in stool.  Genitourinary: Negative for dysuria, frequency and hematuria.  Musculoskeletal: Positive for arthralgias and back pain. Negative for neck pain.  Skin: Negative.   Neurological: Positive for weakness and numbness. Negative for dizziness, seizures, syncope, facial asymmetry, speech difficulty and headaches.  Psychiatric/Behavioral: Negative for confusion and hallucinations.     Physical Exam Updated Vital Signs BP (!) 163/73 (BP Location: Right Arm)   Pulse 90   Temp 98.2 F (36.8 C) (Temporal)   Resp 14   Ht 5\' 5"  (1.651 m)   Wt 81.6 kg   SpO2 100%   BMI 29.95 kg/m   Physical Exam  Constitutional: He is oriented to person, place, and time. He appears well-developed and well-nourished.  Non-toxic appearance.  HENT:  Head: Normocephalic and atraumatic.  Right Ear: Tympanic membrane and  external ear normal.  Left Ear: Tympanic membrane and external ear normal.  No scalp or facial hematoma.  Eyes: Pupils are equal, round, and reactive to light. EOM and lids are normal.  Neck: Normal range of motion. Neck supple. Carotid bruit is not present.  Cardiovascular: Normal rate, regular rhythm, normal heart sounds, intact distal pulses and normal pulses. Exam reveals no gallop and no friction rub.  Pulmonary/Chest: Effort normal and breath sounds normal. No stridor. No respiratory distress. He has no wheezes.  Abdominal: Soft. Bowel sounds are normal. He exhibits no mass. There is no tenderness. There is no guarding.  No  mass or pulsating mass.  Musculoskeletal: Normal range of motion. He exhibits tenderness. He exhibits no edema.       Left shoulder: He exhibits tenderness.       Thoracic back: He exhibits pain.       Back:       Arms: Lymphadenopathy:       Head (right side): No submandibular adenopathy present.       Head (left side): No submandibular adenopathy present.    He has no cervical adenopathy.  Neurological: He is alert and oriented to person, place, and time. He has normal strength. No cranial nerve deficit or sensory deficit.  Speech is understandable. Pt c/o pins and needle sensation from the elbow to the shoulders. He c/o difficulty making a fist with the left hand, and difficulty raising the left arm due to weakness. Pt can lift the left lower extremity, but with difficulty due to weakness.. Good ROM of right upper and lower extremity.  Skin: Skin is warm and dry.  Psychiatric: He has a normal mood and affect. His speech is normal.  Nursing note and vitals reviewed.    ED Treatments / Results  Labs (all labs ordered are listed, but only abnormal results are displayed) Labs Reviewed - No data to display  EKG None  Radiology No results found.  Procedures Procedures (including critical care time) CRITICAL CARE Performed by: Lily Kocher Total  critical care time: *35 minutes Critical care time was exclusive of separately billable procedures and treating other patients. Critical care was necessary to treat or prevent imminent or life-threatening deterioration. Critical care was time spent personally by me on the following activities: development of treatment plan with patient and/or surrogate as well as nursing, discussions with consultants, evaluation of patient's response to treatment, examination of patient, obtaining history from patient or surrogate, ordering and performing treatments and interventions, ordering and review of laboratory studies, ordering and review of radiographic studies, pulse oximetry and re-evaluation of patient's condition. Medications Ordered in ED Medications - No data to display   Initial Impression / Assessment and Plan / ED Course  I have reviewed the triage vital signs and the nursing notes.  Pertinent labs & imaging results that were available during my care of the patient were reviewed by me and considered in my medical decision making (see chart for details).       Final Clinical Impressions(s) / ED Diagnoses MDM  11:04 I am concerned for CODE STROKe. Pt seen with me by Dr Wyvonnia Dusky.  CODE STROKE CALLED BY DR Wyvonnia Dusky. There is also question of AAA due to thoracic area back pain.  Code stroke CT head is negative for intracranial abnormality. 11:40pm - Pt being evaluated by Tele-Neurologist.  Pt not a candidate for TPA per neurologist. Neurology feels the symptoms are getting better. Recheck. Strength in the left arm and lower extremity improved from admission. Pt states he feels better.  Urine drug screen is negative.  Urine analysis shows greater than 300 mg/daL of protein. Complete blood count shows white blood cells to be 8700, the hemoglobin 11.8, the hematocrit 36.2, the MCV 78.4, there is no shift to the left.  The platelets are 227,000. Complaints of metabolic panel shows the BUN to  be 33, the creatinine is elevated at 2.74.  Patient has a history of chronic renal disease.  The glomerular filtration rate is 24.  CT of the cervical spine shows no acute displaced fractures.  The alignment of the cervical spine is unchanged  from previous studies.  There are noted chronic degenerative changes in the cervical spine. CT of the abdomen shows no evidence of significant acute traumatic injury to the thorax.  There is aortic atherosclerosis noted at the left main and to vessels of the coronary arteries.  There is also calcification noted at the aortic valve.  There was a problem obtaining IV access.  Patient has chronic renal disease with elevations in the BUN and creatinine.  Question if the patient has an aneurysmal problem given the pain between the shoulder blades and the thoracic area.  A bedside ultrasound will be attempted by Dr. Wyvonnia Dusky before completing a CT scan with contrast.  Patient's care to be completed by Dr. Wyvonnia Dusky  at end of shift.    Final diagnoses:  Left-sided weakness    ED Discharge Orders    None       Lily Kocher, PA-C 07/25/18 1709    Lily Kocher, PA-C 07/25/18 1710    Ezequiel Essex, MD 07/26/18 [phone]

## 2018-07-25 ENCOUNTER — Observation Stay (HOSPITAL_BASED_OUTPATIENT_CLINIC_OR_DEPARTMENT_OTHER): Payer: Medicare Other

## 2018-07-25 ENCOUNTER — Emergency Department (HOSPITAL_COMMUNITY): Payer: Medicare Other

## 2018-07-25 ENCOUNTER — Observation Stay (HOSPITAL_COMMUNITY): Payer: Medicare Other

## 2018-07-25 ENCOUNTER — Encounter (HOSPITAL_COMMUNITY): Payer: Self-pay | Admitting: Internal Medicine

## 2018-07-25 DIAGNOSIS — R531 Weakness: Secondary | ICD-10-CM

## 2018-07-25 DIAGNOSIS — R590 Localized enlarged lymph nodes: Secondary | ICD-10-CM | POA: Diagnosis present

## 2018-07-25 DIAGNOSIS — Y92009 Unspecified place in unspecified non-institutional (private) residence as the place of occurrence of the external cause: Secondary | ICD-10-CM | POA: Diagnosis not present

## 2018-07-25 DIAGNOSIS — W010XXA Fall on same level from slipping, tripping and stumbling without subsequent striking against object, initial encounter: Secondary | ICD-10-CM | POA: Diagnosis present

## 2018-07-25 DIAGNOSIS — Z7982 Long term (current) use of aspirin: Secondary | ICD-10-CM | POA: Diagnosis not present

## 2018-07-25 DIAGNOSIS — M1990 Unspecified osteoarthritis, unspecified site: Secondary | ICD-10-CM | POA: Diagnosis not present

## 2018-07-25 DIAGNOSIS — E785 Hyperlipidemia, unspecified: Secondary | ICD-10-CM | POA: Diagnosis present

## 2018-07-25 DIAGNOSIS — N184 Chronic kidney disease, stage 4 (severe): Secondary | ICD-10-CM | POA: Diagnosis not present

## 2018-07-25 DIAGNOSIS — K5730 Diverticulosis of large intestine without perforation or abscess without bleeding: Secondary | ICD-10-CM | POA: Diagnosis not present

## 2018-07-25 DIAGNOSIS — G3189 Other specified degenerative diseases of nervous system: Secondary | ICD-10-CM | POA: Diagnosis present

## 2018-07-25 DIAGNOSIS — I340 Nonrheumatic mitral (valve) insufficiency: Secondary | ICD-10-CM | POA: Diagnosis not present

## 2018-07-25 DIAGNOSIS — M549 Dorsalgia, unspecified: Secondary | ICD-10-CM | POA: Diagnosis not present

## 2018-07-25 DIAGNOSIS — S199XXA Unspecified injury of neck, initial encounter: Secondary | ICD-10-CM | POA: Diagnosis not present

## 2018-07-25 DIAGNOSIS — R29701 NIHSS score 1: Secondary | ICD-10-CM | POA: Diagnosis present

## 2018-07-25 DIAGNOSIS — E1122 Type 2 diabetes mellitus with diabetic chronic kidney disease: Secondary | ICD-10-CM | POA: Diagnosis not present

## 2018-07-25 DIAGNOSIS — I25119 Atherosclerotic heart disease of native coronary artery with unspecified angina pectoris: Secondary | ICD-10-CM | POA: Diagnosis present

## 2018-07-25 DIAGNOSIS — Y92003 Bedroom of unspecified non-institutional (private) residence as the place of occurrence of the external cause: Secondary | ICD-10-CM | POA: Diagnosis not present

## 2018-07-25 DIAGNOSIS — S299XXA Unspecified injury of thorax, initial encounter: Secondary | ICD-10-CM | POA: Diagnosis not present

## 2018-07-25 DIAGNOSIS — Z961 Presence of intraocular lens: Secondary | ICD-10-CM | POA: Diagnosis present

## 2018-07-25 DIAGNOSIS — I129 Hypertensive chronic kidney disease with stage 1 through stage 4 chronic kidney disease, or unspecified chronic kidney disease: Secondary | ICD-10-CM | POA: Diagnosis not present

## 2018-07-25 DIAGNOSIS — I672 Cerebral atherosclerosis: Secondary | ICD-10-CM | POA: Diagnosis not present

## 2018-07-25 DIAGNOSIS — K589 Irritable bowel syndrome without diarrhea: Secondary | ICD-10-CM | POA: Diagnosis present

## 2018-07-25 DIAGNOSIS — R911 Solitary pulmonary nodule: Secondary | ICD-10-CM | POA: Diagnosis present

## 2018-07-25 DIAGNOSIS — I700 Atherosclerosis of aorta: Secondary | ICD-10-CM | POA: Diagnosis not present

## 2018-07-25 DIAGNOSIS — R29818 Other symptoms and signs involving the nervous system: Secondary | ICD-10-CM | POA: Diagnosis not present

## 2018-07-25 DIAGNOSIS — R079 Chest pain, unspecified: Secondary | ICD-10-CM | POA: Diagnosis not present

## 2018-07-25 LAB — LIPID PANEL
Cholesterol: 149 mg/dL (ref 0–200)
HDL: 46 mg/dL (ref >40)
LDL Cholesterol: 89 mg/dL (ref 0–99)
Total CHOL/HDL Ratio: 3.2 RATIO
Triglycerides: 71 mg/dL (ref <150)
VLDL: 14 mg/dL (ref 0–40)

## 2018-07-25 LAB — URINALYSIS, ROUTINE W REFLEX MICROSCOPIC
Bilirubin Urine: NEGATIVE
Glucose, UA: NEGATIVE mg/dL
Hgb urine dipstick: NEGATIVE
Ketones, ur: NEGATIVE mg/dL
Leukocytes, UA: NEGATIVE
Nitrite: NEGATIVE
Protein, ur: >=300 mg/dL — AB
Specific Gravity, Urine: 1.005 (ref 1.005–1.030)
pH: 7 (ref 5.0–8.0)

## 2018-07-25 LAB — RAPID URINE DRUG SCREEN, HOSP PERFORMED
Amphetamines: NOT DETECTED
Barbiturates: NOT DETECTED
Benzodiazepines: NOT DETECTED
Cocaine: NOT DETECTED
Opiates: NOT DETECTED
Tetrahydrocannabinol: NOT DETECTED

## 2018-07-25 LAB — ECHOCARDIOGRAM LIMITED
Height: 65 in
Weight: 2962.98 oz

## 2018-07-25 LAB — GLUCOSE, CAPILLARY
Glucose-Capillary: 102 mg/dL — ABNORMAL HIGH (ref 70–99)
Glucose-Capillary: 105 mg/dL — ABNORMAL HIGH (ref 70–99)
Glucose-Capillary: 231 mg/dL — ABNORMAL HIGH (ref 70–99)
Glucose-Capillary: 118 mg/dL — ABNORMAL HIGH (ref 70–99)

## 2018-07-25 LAB — HEMOGLOBIN A1C
Hgb A1c MFr Bld: 6.8 % — ABNORMAL HIGH (ref 4.8–5.6)
Mean Plasma Glucose: 148.46 mg/dL

## 2018-07-25 MED ORDER — METOPROLOL TARTRATE 12.5 MG HALF TABLET
12.5000 mg | ORAL_TABLET | Freq: Two times a day (BID) | ORAL | Status: DC
  Administered 2018-07-25 – 2018-07-26 (×3): 12.5 mg via ORAL
  Filled 2018-07-25 (×3): qty 1

## 2018-07-25 MED ORDER — NIFEDIPINE ER OSMOTIC RELEASE 90 MG PO TB24
90.0000 mg | ORAL_TABLET | Freq: Every day | ORAL | Status: DC
  Administered 2018-07-25 – 2018-07-26 (×2): 90 mg via ORAL
  Filled 2018-07-25 (×2): qty 1

## 2018-07-25 MED ORDER — ACETAMINOPHEN 160 MG/5ML PO SOLN: 650.0000 mg | ORAL | Status: DC | PRN

## 2018-07-25 MED ORDER — ACETAMINOPHEN 325 MG PO TABS: 650.0000 mg | ORAL_TABLET | ORAL | Status: DC | PRN

## 2018-07-25 MED ORDER — ONDANSETRON HCL 4 MG/2ML IJ SOLN: 4.0000 mg | Freq: Four times a day (QID) | INTRAMUSCULAR | Status: DC | PRN

## 2018-07-25 MED ORDER — SENNOSIDES-DOCUSATE SODIUM 8.6-50 MG PO TABS: 1.0000 | ORAL_TABLET | Freq: Every evening | ORAL | Status: DC | PRN

## 2018-07-25 MED ORDER — VITAMIN D 1000 UNITS PO TABS
1000.0000 [IU] | ORAL_TABLET | ORAL | Status: DC
  Administered 2018-07-26: 1000 [IU] via ORAL
  Filled 2018-07-25: qty 1

## 2018-07-25 MED ORDER — STROKE: EARLY STAGES OF RECOVERY BOOK
Freq: Once | Status: AC
  Administered 2018-07-25: 19:00:00
  Filled 2018-07-25: qty 1

## 2018-07-25 MED ORDER — ENOXAPARIN SODIUM 30 MG/0.3ML ~~LOC~~ SOLN
30.0000 mg | SUBCUTANEOUS | Status: DC
  Administered 2018-07-25 – 2018-07-26 (×2): 30 mg via SUBCUTANEOUS
  Filled 2018-07-25 (×2): qty 0.3

## 2018-07-25 MED ORDER — ACETAMINOPHEN 650 MG RE SUPP: 650.0000 mg | RECTAL | Status: DC | PRN

## 2018-07-25 MED ORDER — ASPIRIN 81 MG PO CHEW
324.0000 mg | CHEWABLE_TABLET | Freq: Once | ORAL | Status: AC
  Administered 2018-07-25: 324 mg via ORAL
  Filled 2018-07-25: qty 4

## 2018-07-25 MED ORDER — ONDANSETRON HCL 4 MG PO TABS: 4.0000 mg | ORAL_TABLET | Freq: Four times a day (QID) | ORAL | Status: DC | PRN

## 2018-07-25 MED ORDER — ACETAMINOPHEN 325 MG PO TABS: 650.0000 mg | ORAL_TABLET | Freq: Four times a day (QID) | ORAL | Status: DC | PRN

## 2018-07-25 MED ORDER — DOCUSATE SODIUM 100 MG PO CAPS
100.0000 mg | ORAL_CAPSULE | Freq: Every day | ORAL | Status: DC
  Administered 2018-07-25 – 2018-07-26 (×2): 100 mg via ORAL
  Filled 2018-07-25 (×2): qty 1

## 2018-07-25 MED ORDER — INSULIN ASPART 100 UNIT/ML ~~LOC~~ SOLN: 0.0000 [IU] | Freq: Every day | SUBCUTANEOUS | Status: DC

## 2018-07-25 MED ORDER — LYSINE 500 MG PO CAPS: 500.0000 mg | ORAL_CAPSULE | Freq: Every day | ORAL | Status: DC

## 2018-07-25 MED ORDER — GABAPENTIN 300 MG PO CAPS
300.0000 mg | ORAL_CAPSULE | Freq: Two times a day (BID) | ORAL | Status: DC
  Administered 2018-07-25 – 2018-07-26 (×3): 300 mg via ORAL
  Filled 2018-07-25 (×3): qty 1

## 2018-07-25 MED ORDER — IOPAMIDOL (ISOVUE-370) INJECTION 76%
80.0000 mL | Freq: Once | INTRAVENOUS | Status: AC | PRN
  Administered 2018-07-25: 80 mL via INTRAVENOUS

## 2018-07-25 MED ORDER — PANTOPRAZOLE SODIUM 40 MG PO TBEC
40.0000 mg | DELAYED_RELEASE_TABLET | Freq: Every day | ORAL | Status: DC
  Administered 2018-07-25 – 2018-07-26 (×2): 40 mg via ORAL
  Filled 2018-07-25 (×2): qty 1

## 2018-07-25 MED ORDER — ACETAMINOPHEN 650 MG RE SUPP: 650.0000 mg | Freq: Four times a day (QID) | RECTAL | Status: DC | PRN

## 2018-07-25 MED ORDER — INSULIN ASPART 100 UNIT/ML ~~LOC~~ SOLN
0.0000 [IU] | Freq: Three times a day (TID) | SUBCUTANEOUS | Status: DC
  Administered 2018-07-25: 3 [IU] via SUBCUTANEOUS

## 2018-07-25 MED ORDER — HYDRALAZINE HCL 50 MG PO TABS
50.0000 mg | ORAL_TABLET | Freq: Three times a day (TID) | ORAL | Status: DC
  Administered 2018-07-25 – 2018-07-26 (×4): 50 mg via ORAL
  Filled 2018-07-25 (×4): qty 1

## 2018-07-25 MED ORDER — FAMOTIDINE 20 MG PO TABS
20.0000 mg | ORAL_TABLET | Freq: Two times a day (BID) | ORAL | Status: DC
  Administered 2018-07-25 – 2018-07-26 (×4): 20 mg via ORAL
  Filled 2018-07-25 (×4): qty 1

## 2018-07-25 MED ORDER — ASPIRIN-ACETAMINOPHEN-CAFFEINE 250-250-65 MG PO TABS
2.0000 | ORAL_TABLET | Freq: Four times a day (QID) | ORAL | Status: DC | PRN
  Filled 2018-07-25: qty 2

## 2018-07-25 MED ORDER — SODIUM CHLORIDE 0.9 % IV BOLUS
500.0000 mL | Freq: Once | INTRAVENOUS | Status: AC
  Administered 2018-07-25: 500 mL via INTRAVENOUS

## 2018-07-25 MED ORDER — LINAGLIPTIN 5 MG PO TABS
5.0000 mg | ORAL_TABLET | Freq: Every day | ORAL | Status: DC
  Administered 2018-07-25 – 2018-07-26 (×2): 5 mg via ORAL
  Filled 2018-07-25 (×2): qty 1

## 2018-07-25 MED ORDER — TAMSULOSIN HCL 0.4 MG PO CAPS
0.4000 mg | ORAL_CAPSULE | Freq: Two times a day (BID) | ORAL | Status: DC
  Administered 2018-07-25 – 2018-07-26 (×3): 0.4 mg via ORAL
  Filled 2018-07-25 (×3): qty 1

## 2018-07-25 MED ORDER — ASPIRIN EC 81 MG PO TBEC
81.0000 mg | DELAYED_RELEASE_TABLET | Freq: Every day | ORAL | Status: DC
  Administered 2018-07-25 – 2018-07-26 (×2): 81 mg via ORAL
  Filled 2018-07-25 (×2): qty 1

## 2018-07-25 MED ORDER — ALBUTEROL SULFATE (2.5 MG/3ML) 0.083% IN NEBU: 3.0000 mL | INHALATION_SOLUTION | Freq: Four times a day (QID) | RESPIRATORY_TRACT | Status: DC | PRN

## 2018-07-25 MED ORDER — LABETALOL HCL 5 MG/ML IV SOLN
20.0000 mg | Freq: Once | INTRAVENOUS | Status: AC
  Administered 2018-07-25: 20 mg via INTRAVENOUS
  Filled 2018-07-25: qty 4

## 2018-07-25 MED ORDER — ENOXAPARIN SODIUM 30 MG/0.3ML ~~LOC~~ SOLN: 30.0000 mg | SUBCUTANEOUS | Status: DC

## 2018-07-25 MED ORDER — PRAVASTATIN SODIUM 40 MG PO TABS
40.0000 mg | ORAL_TABLET | Freq: Every evening | ORAL | Status: DC
  Administered 2018-07-25: 40 mg via ORAL
  Filled 2018-07-25: qty 1

## 2018-07-25 NOTE — Progress Notes (Signed)
[facility] TeleNeurology Consult Services    Date of Service:   07/24/2018 23:30:05  Impression:      .  RO Acute Ischemic Stroke     .  Right Hemispheric  Comments: Patient with hx of HTN, HLD, DM, CKD4, who presents after a fall and subsequent left sided weakness. Concern for right hemispheric stroke. no contraindications to tpa. no hematuria, GI malignancies, no CVA, no anticoagulants, was complaining of back pain, will need to exclude dissection. CT head unremarkable. NIHSS 1 Given that he is rapidly improving and symptoms are very subtle and nondisabling, and concern for dissection not an ideal tpa candidate. Also he is 82 years old and hx of DM, LSN 3 hrs ago. Patient is in agreement. Per exam by ED physician, grip is strong on teh left, he is markedly improved. denies chest pain and back pain. given that mild left leg drift, NIHSS 1, no clinical indication for CTA head and neck as no dense hemiparesis, aphasia, neglect, ataxia  Mechanism of Stroke: Possible Thromboembolic Possible Cardioembolic Small Vessel Disease  Metrics: Last Known Well: 07/25/2018 21:00:00 [facility] Notification Time: 07/24/2018 23:30:05 Arrival Time: 07/25/2018 22:17:00 Stamp Time: 07/24/2018 23:30:05 Time First Login Attempt: 07/25/2018 23:36:00 Video Start Time: 07/25/2018 23:36:00  Symptoms: s/p fall and left sided weakness. NIHSS Start Assessment Time: 07/25/2018 23:39:00 Patient is not a candidate for tPA. Patient was not deemed candidate for tPA thrombolytics because of mild and nondisabling symptoms. Video End Time: 07/25/2018 00:02:00  CT head showed no acute hemorrhage or acute core infarct.  Advanced imaging was not obtained as the presentation was not suggestive of Large Vessel Occlusive Disease.  ER physician notified of the decision on thrombolytics management.  Our recommendations are outlined below.  Recommendations:     .  Antiplatelet Therapy Recommended     .   routine stroke workup MRI brain continue asa permissive HTN NPO stroke labs tight glycemic control  Routine Consultation with [facility] for Follow up Care  Sign Out:     .  Discussed with Emergency Department Provider    ------------------------------------------------------------------------------  History of Present Illness:  Patient is a 82 years old Male.  Patient was brought by EMS for symptoms of s/p fall and left sided weakness.  Patient is a pleasant gentelman with CKD 4, HTN, HLD, DM, prostate cancer who fell as he was walking from the bathroom and fell. He could not get up due to weakness n the left side. He did not have LOC, did not hit his head. No incontinence, denies chest pain, no neck pain. He does have stinging in his back between his shoulder blades.  CT head showed no acute hemorrhage or acute core infarct.  Last seen normal was within 4.5 hours. There is no history of hemorrhagic complications or intracranial hemorrhage. There is no history of Recent Anticoagulants. There is no history of recent major surgery. There is no history of recent stroke.  Examination:  1A: Level of Consciousness - Alert; keenly responsive + 0 1B: Ask Month and Age - Both Questions Right + 0 1C: Blink Eyes & Squeeze Hands - Performs Both Tasks + 0 2: Test Horizontal Extraocular Movements - Normal + 0 3: Test Visual Fields - No Visual Loss + 0 4: Test Facial Palsy (Use Grimace if Obtunded) - Normal symmetry + 0 5A: Test Left Arm Motor Drift - No Drift for 10 Seconds + 0 5B: Test Right Arm Motor Drift - No Drift for 10 Seconds + 0  6A: Test Left Leg Motor Drift - Drift, but doesn't hit bed + 1 6B: Test Right Leg Motor Drift - No Drift for 5 Seconds + 0 7: Test Limb Ataxia (FNF/Heel-Shin) - No Ataxia + 0 8: Test Sensation - Normal; No sensory loss + 0 9: Test Language/Aphasia - Normal; No aphasia + 0 10: Test Dysarthria - Normal + 0 11: Test Extinction/Inattention - No  abnormality + 0  NIHSS Score: 1  Patient was informed the Neurology Consult would happen via TeleHealth consult by way of interactive audio and video telecommunications and consented to receiving care in this manner.  Due to the immediate potential for life-threatening deterioration due to underlying acute neurologic illness, I spent 35 minutes providing critical care. This time includes time for face to face visit via telemedicine, review of medical records, imaging studies and discussion of findings with providers, the patient and/or family.   Dr Jacqulynn Cadet Brean [place]   [facility] [phone]

## 2018-07-25 NOTE — Progress Notes (Signed)
Code stroke  Beeper  2257 Call  None In room 2310 Out of ct [place]

## 2018-07-25 NOTE — ED Notes (Signed)
Pt up at bedside to use urinal with one assist. Unsteady on feet.

## 2018-07-25 NOTE — Progress Notes (Signed)
  Echocardiogram 2D Echocardiogram has been performed.  Steve [name] 07/25/2018, 8:49 AM

## 2018-07-25 NOTE — Progress Notes (Signed)
VASCULAR LAB PRELIMINARY  PRELIMINARY  PRELIMINARY  PRELIMINARY  Carotid duplex completed.    Preliminary report:  1-39% ICA stenosis. Vertebral artery flow is antegrade.   Nilan Iddings, RVT 07/25/2018, 9:20 AM

## 2018-07-25 NOTE — Evaluation (Signed)
Physical Therapy Evaluation Patient Details Name: Steve [name] MRN: [id number] DOB: 02-02-1936 Today's Date: 07/25/2018   History of Present Illness    82 y.o. male with medical history significant of OA, CKD, HTN, Grade I diastolic dysfunction, hyperlipidemia, prostate cancer, type 2 diabetes who is coming to the hospital due to having a fall after he got up from bed to go to the bathroom, hitting his chest wall, left shoulder and and back. Reported mild L side weakness.  MRI negative.    Clinical Impression  Pt presents with mild limitations to functional mobility due to weakness, lack of endurance, mild instability on standing, resulting in dependency assistive device for ambulation.  Strength 5/5 X4 except bilateral hamstrings.  Pt does endorse transient woozy feeling on standing, resolves quickly.  Question cardiovascular vs. Medication related?  Recommend up with nursing using RW for in/out of room walking, check orthstatics, and HHPT follow up at d/c. PT will initiate therapy in hospital.      Follow Up Recommendations Home health PT    Equipment Recommendations  Rolling walker with 5" wheels    Recommendations for Other Services       Precautions / Restrictions Precautions Precautions: Fall Precaution Comments: use RW, supervision when up Restrictions Weight Bearing Restrictions: No      Mobility  Bed Mobility Overal bed mobility: Independent                Transfers Overall transfer level: Needs assistance Equipment used: Rolling walker (2 wheeled) Transfers: Sit to/from Stand Sit to Stand: Supervision;Min guard         General transfer comment: initially stood without device and tranisent symptoms of instability.  Walked with hand held assist around foot of bed, pt hanging onto footrest.  Applied RW, pt with increase confidence, able to walk supervision in room, needs instruction on best use of device, but agrees it's helpful  currently  Ambulation/Gait Ambulation/Gait assistance: Min assist;Supervision Gait Distance (Feet): 30 Feet Assistive device: Rolling walker (2 wheeled);1 person hand held assist Gait Pattern/deviations: Step-through pattern;Trunk flexed     General Gait Details: see above, pt unstedy with gait, better with RW though reluctant to try agrees it helped him feel safer.    Stairs            Wheelchair Mobility    Modified Rankin (Stroke Patients Only)       Balance Overall balance assessment: Mild deficits observed, not formally tested(transient lightheaded, could be orthostatic, unable to asses)                                           Pertinent Vitals/Pain Pain Assessment: No/denies pain    Home Living Family/patient expects to be discharged to:: Private residence Living Arrangements: Alone Available Help at Discharge: Friend(s);Neighbor;Available PRN/intermittently Type of Home: Apartment Home Access: Level entry     Home Layout: One level Home Equipment: None      Prior Function Level of Independence: Independent               Hand Dominance   Dominant Hand: Right    Extremity/Trunk Assessment   Upper Extremity Assessment Upper Extremity Assessment: Defer to OT evaluation;Overall WFL for tasks assessed    Lower Extremity Assessment Lower Extremity Assessment: Overall WFL for tasks assessed(mild bil hamstring weakness, else equal X4)    Cervical / Trunk Assessment Cervical / Trunk Assessment:  Normal  Communication   Communication: No difficulties  Cognition Arousal/Alertness: Awake/alert Behavior During Therapy: WFL for tasks assessed/performed Overall Cognitive Status: Within Functional Limits for tasks assessed                                        General Comments General comments (skin integrity, edema, etc.): attempted to assess ortho BP, pt fatigued and requested return to bed "I've not slept at  all"  Please assess next visit    Exercises     Assessment/Plan    PT Assessment Patient needs continued PT services  PT Problem List Decreased activity tolerance;Decreased balance;Decreased mobility;Decreased knowledge of use of DME;Cardiopulmonary status limiting activity       PT Treatment Interventions DME instruction;Gait training;Functional mobility training;Balance training    PT Goals (Current goals can be found in the Care Plan section)  Acute Rehab PT Goals Patient Stated Goal: get strength back PT Goal Formulation: With patient Time For Goal Achievement: 08/08/18 Potential to Achieve Goals: Good    Frequency Min 3X/week   Barriers to discharge Decreased caregiver support has nearby help    Co-evaluation               AM-PAC PT "6 Clicks" Daily Activity  Outcome Measure Difficulty turning over in bed (including adjusting bedclothes, sheets and blankets)?: None Difficulty moving from lying on back to sitting on the side of the bed? : None Difficulty sitting down on and standing up from a chair with arms (e.g., wheelchair, bedside commode, etc,.)?: None Help needed moving to and from a bed to chair (including a wheelchair)?: A Little Help needed walking in hospital room?: A Little Help needed climbing 3-5 steps with a railing? : A Little 6 Click Score: 21    End of Session Equipment Utilized During Treatment: Gait belt Activity Tolerance: Patient tolerated treatment well Patient left: in bed;with call bell/phone within reach Nurse Communication: Mobility status PT Visit Diagnosis: Unsteadiness on feet (R26.81);History of falling (Z91.81)    Time: 6503-5465 PT Time Calculation (min) (ACUTE ONLY): 24 min   Charges:   PT Evaluation $PT Eval Low Complexity: 1 Low          Kearney Hard, PT, DPT, MS Board Certified Geriatric Clinical Specialist  Steve [name] 07/25/2018, 2:28 PM

## 2018-07-25 NOTE — H&P (Signed)
History and Physical    Steve [name] JHE:[id number] DOB: 12-12-1935 DOA: 07/24/2018  PCP: Rosita Fire, MD   Patient coming from: Home.  I have personally briefly reviewed patient's old medical records in [place]  Chief Complaint: Fall and left-sided weakness.  HPI: Steve [name] is a 82 y.o. male with medical history significant of OA, CKD, HTN, Grade I diastolic dysfunction, hyperlipidemia, prostate cancer, type 2 diabetes who is coming to the hospital due to having a fall after he got up from bed to go to the bathroom.  He states that he was not sleeping, but as he was returning back to bed he fell hitting his chest wall, left shoulder and and back.  When he tried to get up, he fell and then noticed that his left side was weak.  He states that up earlier in the evening prior to going to bed he did not notice any weakness.  He denies left-sided numbness, headache, blurred vision, slurred speech, difficulty with language comprehension or finding words or dizziness.  He denies fever, chills, sore throat, rhinorrhea, dyspnea, wheezing, anginal chest pain, palpitations, diaphoresis, PND, orthopnea or pitting edema lower extremities.  No abdominal pain, nausea, emesis, diarrhea, constipation, melena or hematochezia.  Denies dysuria, frequency or hematuria.  No polyuria, polydipsia or polyphagia.  No heat or cold intolerance per  ED Course: Initial vital signs in the ED temperature 98.2 F, pulse 90, respirations 14, blood pressure 163/73 mmHg and O2 sat 100% on room air. He received 20 mg of labetalol IVP x1 dose and aspirin 324 mg p.o. X1.  His UDS was negative.  Urinalysis shows straw-colored with more than 300 mg/dL of protein and rare bacteria.  CBC shows a white count of 8.7, hemoglobin 11.8 g/dL and platelets 227 with a normal differential.  PT/INR/PTT within normal limits.  Troponin was normal.  LFTs within normal limits.  Electrolytes are unremarkable.  BUN was 33, creatinine 2.74  and glucose 152 mg/dL.  Alcohol level was normal.  CT head was negative for acute intracranial pathology.  CTA chest abdomen and pelvis did not show any acute abnormality.  However there were areas of ulcerated plaque on the thoracic aorta among other findings.  Please see images and full radiology report for further detail.  Review of Systems: As per HPI otherwise 10 point review of systems negative.  Past Medical History:  Diagnosis Date  . Arthritis   . CKD (chronic kidney disease) stage 4, GFR 15-29 ml/min (HCC)   . Essential hypertension Grade I diastolic dysfunction   . Hyperlipidemia   . Prostate cancer ([place])   . Renal insufficiency   . Type 2 diabetes mellitus ([place])     Past Surgical History:  Procedure Laterality Date  . CATARACT EXTRACTION W/PHACO Right 04/28/2016   Procedure: CATARACT EXTRACTION PHACO AND INTRAOCULAR LENS PLACEMENT (IOC);  Surgeon: Williams Che, MD;  Location: [facility];  Service: Ophthalmology;  Laterality: Right;  CDE: 4.28  . COLONOSCOPY     about 2 years in [place]   . CYSTECTOMY       reports that he quit smoking about 30 years ago. His smoking use included pipe and cigars. He started smoking about 63 years ago. He has a 50.00 pack-year smoking history. He has never used smokeless tobacco. He reports that he does not drink alcohol or use drugs.  No Known Allergies  Family History  Problem Relation Age of Onset  . Heart disease Mother  cause of death  . Diabetes Sister   . Colon cancer Neg Hx    Prior to Admission medications   Medication Sig Start Date End Date Taking? Authorizing Provider  albuterol (PROVENTIL HFA;VENTOLIN HFA) 108 (90 Base) MCG/ACT inhaler Inhale 1-2 puffs into the lungs every 6 (six) hours as needed for wheezing or shortness of breath.   Yes [provider]  aspirin 81 MG tablet Take 81 mg by mouth daily.   Yes [provider]  aspirin-acetaminophen-caffeine (EXCEDRIN MIGRAINE) [id number] MG tablet  Take 2 tablets by mouth every 6 (six) hours as needed for headache.   Yes [provider]  Cholecalciferol (VITAMIN D-3) 1000 UNITS CAPS Take 1 capsule by mouth once a week.    Yes [provider]  docusate sodium (COLACE) 100 MG capsule Take 100 mg by mouth daily.    Yes [provider]  gabapentin (NEURONTIN) 300 MG capsule Take 1 capsule by mouth 2 (two) times daily. 01/19/15  Yes [provider]  hydrALAZINE (APRESOLINE) 25 MG tablet Take 2 tablets (50 mg total) by mouth 3 (three) times daily. 05/13/18 08/11/18 Yes Kathie Dike, MD  linagliptin (TRADJENTA) 5 MG TABS tablet Take 5 mg by mouth daily.   Yes [provider]  Lysine 500 MG CAPS Take 500 mg by mouth daily.   Yes [provider]  metoprolol tartrate (LOPRESSOR) 25 MG tablet Take 0.5 tablets (12.5 mg total) by mouth 2 (two) times daily. 06/15/18  Yes Strader, [place], PA-C  NIFEdipine (PROCARDIA XL/ADALAT-CC) 90 MG 24 hr tablet TAKE (1) TABLET BY MOUTH ONCE DAILY. Patient taking differently: Take 90 mg by mouth daily.  06/17/18  Yes Herminio Commons, MD  omeprazole (PRILOSEC) 20 MG capsule Take 1 capsule by mouth 2 (two) times daily. 04/06/13  Yes [provider]  pravastatin (PRAVACHOL) 40 MG tablet Take 1 tablet by mouth every evening.  04/06/13  Yes [provider]  tamsulosin (FLOMAX) 0.4 MG CAPS Take 1 capsule by mouth 2 (two) times daily.  04/06/13  Yes [provider]    Physical Exam: Vitals:   07/25/18 0200 07/25/18 0205 07/25/18 0253 07/25/18 0300  BP: 137/83   140/78  Pulse:  90  87  Resp: (!) 24 20  17   Temp:   98 F (36.7 C)   TempSrc:      SpO2:  98%  98%  Weight:      Height:        Constitutional: NAD, calm, comfortable Eyes: PERRL, lids and conjunctivae normal ENMT: Mucous membranes are moist. Posterior pharynx clear of any exudate or lesions.Normal dentition.  Neck: normal, supple, no masses, no thyromegaly Respiratory:  clear to auscultation bilaterally, no wheezing, no crackles. Normal respiratory effort. No accessory muscle use.  Cardiovascular: Regular rate and rhythm, no murmurs / rubs / gallops. No extremity edema. 2+ pedal pulses. No carotid bruits.  Abdomen: Soft, no tenderness, no masses palpated. No hepatosplenomegaly. Bowel sounds positive.  Musculoskeletal: no clubbing / cyanosis. Good ROM, but mild tenderness of chest wall and shoulder. no contractures. Normal muscle tone.  Skin: no rashes, lesions, ulcers. No induration Neurologic: CN 2-12 grossly intact. Sensation intact, DTR normal. Strength 4.5/5 left sided hemiparesis. Psychiatric: Normal judgment and insight. Alert and oriented x 3. Normal mood.   Labs on Admission: I have personally reviewed following labs and imaging studies  CBC: Recent Labs  Lab 07/24/18 2326 07/24/18 2334  WBC 8.7  --   NEUTROABS 6.7  --  HGB 11.8* 11.9*  HCT 36.2* 35.0*  MCV 78.4*  --   PLT 227  --    Basic Metabolic Panel: Recent Labs  Lab 07/24/18 2326 07/24/18 2334  NA 135 136  K 4.2 4.2  CL 105 105  CO2 21*  --   GLUCOSE 152* 151*  BUN 33* 31*  CREATININE 2.74* 3.00*  CALCIUM 9.2  --    GFR: Estimated Creatinine Clearance: 19.3 mL/min (A) (by C-G formula based on SCr of 3 mg/dL (H)). Liver Function Tests: Recent Labs  Lab 07/24/18 2326  AST 21  ALT 20  ALKPHOS 71  BILITOT 0.5  PROT 8.0  ALBUMIN 4.0   No results for input(s): LIPASE, AMYLASE in the last 168 hours. No results for input(s): AMMONIA in the last 168 hours. Coagulation Profile: Recent Labs  Lab 07/24/18 2326  INR 0.96   Cardiac Enzymes: No results for input(s): CKTOTAL, CKMB, CKMBINDEX, TROPONINI in the last 168 hours. BNP (last 3 results) No results for input(s): PROBNP in the last 8760 hours. HbA1C: No results for input(s): HGBA1C in the last 72 hours. CBG: No results for input(s): GLUCAP in the last 168 hours. Lipid Profile: No results for input(s): CHOL,  HDL, LDLCALC, TRIG, CHOLHDL, LDLDIRECT in the last 72 hours. Thyroid Function Tests: No results for input(s): TSH, T4TOTAL, FREET4, T3FREE, THYROIDAB in the last 72 hours. Anemia Panel: No results for input(s): VITAMINB12, FOLATE, FERRITIN, TIBC, IRON, RETICCTPCT in the last 72 hours. Urine analysis:    Component Value Date/Time   COLORURINE STRAW (A) 07/25/2018 0019   APPEARANCEUR CLEAR 07/25/2018 0019   LABSPEC 1.005 07/25/2018 0019   PHURINE 7.0 07/25/2018 0019   GLUCOSEU NEGATIVE 07/25/2018 0019   HGBUR NEGATIVE 07/25/2018 0019   BILIRUBINUR NEGATIVE 07/25/2018 0019   KETONESUR NEGATIVE 07/25/2018 0019   PROTEINUR >=300 (A) 07/25/2018 0019   NITRITE NEGATIVE 07/25/2018 0019   LEUKOCYTESUR NEGATIVE 07/25/2018 0019    Radiological Exams on Admission: Ct Chest Wo Contrast  Result Date: 07/25/2018 CLINICAL DATA:  82 year old male with history of left-sided weakness. Fall yesterday. EXAM: CT CHEST WITHOUT CONTRAST TECHNIQUE: Multidetector CT imaging of the chest was performed following the standard protocol without IV contrast. COMPARISON:  Chest CT 07/05/2007. FINDINGS: Cardiovascular: Heart size is borderline enlarged. Small amount of anterior pericardial fluid and/or thickening, unlikely to be of any hemodynamic significance at this time. No associated pericardial calcification. There is aortic atherosclerosis, as well as atherosclerosis of the great vessels of the mediastinum and the coronary arteries, including calcified atherosclerotic plaque in the left main, left anterior descending and right coronary arteries. Calcifications of the aortic valve and mitral annulus. Mediastinum/Nodes: No pathologically enlarged mediastinal or hilar lymph nodes. Please note that accurate exclusion of hilar adenopathy is limited on noncontrast CT scans. Some densely calcified mediastinal lymph nodes are incidentally noted. Esophagus is unremarkable in appearance. No axillary lymphadenopathy.  Lungs/Pleura: Small calcified granulomas in the left lung. 5 mm subpleural nodule in the periphery of the left upper lobe (axial image 40 of series 4), stable dating back to 2008, considered definitively benign. No other suspicious appearing pulmonary nodules are noted. No acute consolidative airspace disease. No pleural effusions. Diffuse bronchial wall thickening. Widespread areas of septal thickening and mild subpleural reticulation throughout the mid to lower lungs bilaterally. No frank honeycombing. Upper Abdomen: Aortic atherosclerosis. Low-attenuation lesions in the right kidney, incompletely characterized on today's noncontrast CT examination, but statistically likely to represent cysts, largest of which measures up to 3.2 cm in the  upper pole. Musculoskeletal: There are no acute displaced fractures or aggressive appearing lytic or blastic lesions noted in the visualized portions of the skeleton. IMPRESSION: 1. No evidence of significant acute traumatic injury to the thorax. 2. The appearance of the lungs suggests interstitial lung disease. Outpatient referral to Pulmonology for further evaluation is suggested in the near future. 3. Aortic atherosclerosis, in addition to left main and 2 vessel coronary artery disease. 4. There are calcifications of the aortic valve and mitral annulus. Echocardiographic correlation for evaluation of potential valvular dysfunction may be warranted if clinically indicated. Aortic Atherosclerosis (ICD10-I70.0). Electronically Signed   By: Vinnie Langton M.D.   On: 07/25/2018 00:58   Ct Cervical Spine Wo Contrast  Result Date: 07/25/2018 CLINICAL DATA:  Left-sided weakness after a fall yesterday. EXAM: CT CERVICAL SPINE WITHOUT CONTRAST TECHNIQUE: Multidetector CT imaging of the cervical spine was performed without intravenous contrast. Multiplanar CT image reconstructions were also generated. COMPARISON:  Cervical spine radiographs 06/13/2009 FINDINGS: Alignment: There is  mild retrolisthesis of C3 on C4 with mild anterolisthesis of C4 on C5 and C5 on C6. This alignment appears unchanged since the previous study and likely represents chronic degenerative change. C1-2 articulation appears intact. Skull base and vertebrae: Skull base appears intact. No vertebral compression deformities. No focal bone lesion or bone destruction. Bone cortex appears intact. Soft tissues and spinal canal: No prevertebral soft tissue swelling. No abnormal paraspinal soft tissue mass or infiltration. Ligamentous calcification posterior to the spinous processes. Disc levels: Degenerative changes in the cervical spine with narrowed disc spaces and endplate hypertrophic changes most prominent at C3-4, C6-7, and C7-T1 levels. Uncovertebral spurring causes some bone encroachment upon neural foramina at multiple levels bilaterally. Upper chest: Lung apices are clear. Other: None. IMPRESSION: No acute displaced fractures identified in the cervical spine. Alignment of the cervical spine is unchanged since previous study suggesting chronic degenerative changes. Degenerative changes in the cervical spine. Electronically Signed   By: Lucienne Capers M.D.   On: 07/25/2018 00:48   Ct Angio Chest/abd/pel For Dissection W And/or Wo Contrast  Result Date: 07/25/2018 CLINICAL DATA:  Chest pain. EXAM: CT ANGIOGRAPHY CHEST, ABDOMEN AND PELVIS TECHNIQUE: Multidetector CT imaging through the chest, abdomen and pelvis was performed using the standard protocol during bolus administration of intravenous contrast. Multiplanar reconstructed images and MIPs were obtained and reviewed to evaluate the vascular anatomy. CONTRAST:  37mL ISOVUE-370 IOPAMIDOL (ISOVUE-370) INJECTION 76% COMPARISON:  Noncontrast CT chest 07/25/2018. CT abdomen and pelvis 03/25/2018. FINDINGS: CTA CHEST FINDINGS Cardiovascular: Images obtained during the arterial phase after contrast administration demonstrates normal caliber thoracic aorta. No evidence  of aortic dissection. Scattered aortic calcifications. Mild mural thrombus in the descending aorta with mild ulcerating plaque formation. Mild cardiac enlargement. No pericardial effusions. Central pulmonary arteries are well opacified without evidence of significant pulmonary embolus. Mediastinum/Nodes: Esophageal hiatal hernia. Esophagus is decompressed. Scattered mediastinal lymph nodes are mildly enlarged. Largest pretracheal node measures about 9 mm in diameter. Lungs/Pleura: Interstitial changes in the lung bases, likely fibrosis. Subpleural nodule in the left upper lung measuring 5 mm diameter. No change since prior study. No consolidation or airspace disease. No pleural effusions. No pneumothorax. Airways are patent. Musculoskeletal: No chest wall abnormality. No acute or significant osseous findings. Review of the MIP images confirms the above findings. CTA ABDOMEN AND PELVIS FINDINGS VASCULAR Aorta: Normal caliber aorta without aneurysm, dissection, vasculitis or significant stenosis. Scattered aortic calcifications. Celiac: Patent without evidence of aneurysm, dissection, vasculitis or significant stenosis. SMA: Patent without evidence  of aneurysm, dissection, vasculitis or significant stenosis. Renals: Both renal arteries are patent without evidence of aneurysm, dissection, vasculitis, fibromuscular dysplasia or significant stenosis. IMA: Patent without evidence of aneurysm, dissection, vasculitis or significant stenosis. Inflow: Patent without evidence of aneurysm, dissection, vasculitis or significant stenosis. Veins: No obvious venous abnormality within the limitations of this arterial phase study. Review of the MIP images confirms the above findings. NON-VASCULAR Hepatobiliary: No focal liver abnormality is seen. No gallstones, gallbladder wall thickening, or biliary dilatation. Pancreas: Unremarkable. No pancreatic ductal dilatation or surrounding inflammatory changes. Spleen: Normal in size without  focal abnormality. Adrenals/Urinary Tract: No adrenal gland nodules. Renal nephrograms are symmetrical. Bilateral renal cysts. No hydronephrosis or hydroureter. Bladder is unremarkable. Stomach/Bowel: Stomach, small bowel, and colon are not abnormally distended. No wall thickening or inflammatory infiltration. Stool throughout the colon. Diverticulosis of the sigmoid colon without evidence of diverticulitis. Appendix is normal. Lymphatic: No significant lymphadenopathy. Reproductive: Surgical clips versus seed implants in the prostate gland. Correlation with prior history suggested. Other: No free air or free fluid in the abdomen. Abdominal wall musculature appears intact. Musculoskeletal: Degenerative changes in the spine. No destructive bone lesions. Review of the MIP images confirms the above findings. IMPRESSION: 1. No evidence of aortic dissection or pulmonary embolus. 2. Areas of ulcerated plaque formation in the descending thoracic aorta. Aortic calcifications. 3. Stable 5 mm subpleural nodule in the left upper lung. 4. Mild mediastinal adenopathy, nonspecific. 5. No evidence of abdominal aortic dissection or aneurysm. 6. No acute process demonstrated in the abdomen or pelvis. 7. Diverticulosis without evidence of diverticulitis. Electronically Signed   By: Lucienne Capers M.D.   On: 07/25/2018 02:42   Ct Head Code Stroke Wo Contrast  Result Date: 07/24/2018 CLINICAL DATA:  Code stroke.  Fall with muscle weakness EXAM: CT HEAD WITHOUT CONTRAST TECHNIQUE: Contiguous axial images were obtained from the base of the skull through the vertex without intravenous contrast. COMPARISON:  Head CT 06/07/2016 FINDINGS: Brain: There is no mass, hemorrhage or extra-axial collection. Mild generalized volume loss. There is no acute or chronic infarction. There is hypoattenuation of the periventricular white matter, most commonly indicating chronic ischemic microangiopathy. Vascular: No abnormal hyperdensity of the  major intracranial arteries or dural venous sinuses. No intracranial atherosclerosis. Skull: The visualized skull base, calvarium and extracranial soft tissues are normal. Sinuses/Orbits: No fluid levels or advanced mucosal thickening of the visualized paranasal sinuses. No mastoid or middle ear effusion. The orbits are normal. ASPECTS [facility] Stroke Program Early CT Score) - Ganglionic level infarction (caudate, lentiform nuclei, internal capsule, insula, M1-M3 cortex): 7 - Supraganglionic infarction (M4-M6 cortex): 3 Total score (0-10 with 10 being normal): 10 IMPRESSION: 1. No acute intracranial abnormality. 2. ASPECTS is 10. These results were called by telephone at the time of interpretation on 07/24/2018 at 11:31 pm to Dr. Ezequiel Essex , who verbally acknowledged these results. Electronically Signed   By: Ulyses Jarred M.D.   On: 07/24/2018 23:32   Echo 05/13/2018  ------------------------------------------------------------------- LV EF: 65% -   70%  ------------------------------------------------------------------- Indications:      Chest pain 786.51.  ------------------------------------------------------------------- History:   PMH:  Former Smoker. Prostate Cancer. Elevated Troponin.  Angina pectoris.  Risk factors:  Hypertension. Diabetes mellitus.   ------------------------------------------------------------------- Study Conclusions  - Left ventricle: The cavity size was normal. Wall thickness was   increased in a pattern of moderate LVH. Systolic function was   vigorous. The estimated ejection fraction was in the range of 65%   to 70%. Wall  motion was normal; there were no regional wall   motion abnormalities. Doppler parameters are consistent with   abnormal left ventricular relaxation (grade 1 diastolic   dysfunction). - Aortic valve: Mildly to moderately calcified annulus. Trileaflet;   moderately thickened leaflets. Valve area (VTI): 2.89 cm^2. Valve   area (Vmax):  2.66 cm^2. - Mitral valve: Mildly calcified annulus. Mildly thickened leaflets   . - Atrial septum: No defect or patent foramen ovale was identified. - Pulmonary arteries: Systolic pressure was moderately increased.   PA peak pressure: 56 mm Hg (S). - Technically adequate study.  EKG: Independently reviewed. Vent. rate 88 BPM PR interval * ms QRS duration 98 ms QT/QTc 365/442 ms P-R-T axes 53 -23 -15 Sinus rhythm Probable left atrial enlargement Borderline left axis deviation Nonspecific repol abnormality, diffuse leads Baseline wander in lead(s) V2 V5 V6  Assessment/Plan Principal Problem:   Acute left-sided weakness Observation/telemetry.  Frequent neuro checks. Swallow evaluation. PT/OT/SLP. Check hemoglobin A1c and fasting lipids. Check carotid Dopplers and echocardiogram. Check MR of brain. Consult neurology.  Active Problems:   HTN (hypertension) Continue hydralazine 50 mg p.o. 3 times daily. Continue metoprolol 12.5 mg p.o. twice daily. Continue nifedipine 90 mg p.o. daily.    DM (diabetes mellitus), type 2 with renal complications (HCC) Carbohydrate modified diet. On Tradjenta 5 mg p.o. daily. CBG monitoring before meals and bedtime.    CKD (chronic kidney disease), stage IV (HCC) Monitor renal function electrolytes.    Hyperlipidemia Continue pravastatin 40 mg p.o. daily    DVT prophylaxis: Lovenox SQ. Code Status: Full code. Family Communication:  Disposition Plan: Observation for CVA work-up. Consults called: Tele-neurology was consulted by the emergency department. Admission status: Observation/telemetry.   Reubin Milan MD [facility] Pager [phone].  If 7PM-7AM, please contact night-coverage [email or web address] Password [email or web address]  07/25/2018, 4:20 AM

## 2018-07-25 NOTE — ED Provider Notes (Signed)
Medical screening examination/treatment/procedure(s) were conducted as a shared visit with non-physician practitioner(s) and myself.  I personally evaluated the patient during the encounter.  Patient seen with Mitzi Davenport.  Patient presents after falling in his bedroom.  Does not recall what made him fall but states he "became "tripped up" and fell and could not get up. Thinks he fell forward onto outstretched hands. He had to call his friend to help him up and his friend noticed weakness on the L side was well.  Denies hitting head or losing consciousness.  No bowel or bladder incontinence.  Complaining of "tingling" between his shoulder blades but no chest pain or back pain.  Found to have left-sided weakness in arm and leg on arrival and code stroke activated.  Last seen normal about 10 PM.  Patient does have pronator drift of left arm and left leg with decreased grip strength on the left.  Cranial nerves II to XII intact. Intact DP pulses but somewhat diminished on L. Feet warm and well perfused.  CT head is negative.  Seen by tele-neurology. Dr. Barbee Shropshire.   His deficits are minimal and they are improving and she does not feel he is a candidate for TPA.  Does not recommend CT angiogram as he is VAN negative and his deficits are improving. Dr. Barbee Shropshire feels patient's exam does not represent large vessel occlusion.  Patient with persistent tachycardia as well as discomfort between the shoulder blades.  There is no pulse deficits or blood pressure deficits.  There is some concern for possible aortic dissection as possible dissection flap is seen on bedside ultrasound.  Delay in obtaining appropriate IV access.  Patient with creatinine of 3 and CKD.  Discussed with patient risks and benefits of giving IV contrast to rule out life-threatening aortic dissection.  And he agrees to proceed.  D/w Dr. Servando Snare of thoracic surgery regarding US findings.  He recommends CT angiogram for definitive  proof.  Again discussed risks and benefits of IV contrast with the patient. he agrees to proceed.  He understands this is the only way to effectively rule out aortic dissection which can be life-threatening.  He understands that giving him IV contrast may theoretically put risk to his kidneys which may not be reversible.  CT angiogram performed.  This was negative for acute aortic pathology. Questionable dissection flap seen on bedside ultrasound not appreciated on CT.  No evidence of aortic dissection.  Patient remains comfortable.  He did receive 1 dose of labetalol when there was concern for possible dissection.  Will need to allow permissive hypertension given his possible stroke.  Admission discussed with Dr. Olevia Bowens.  Patient transferred to [facility] as MRI is not available.  EKG Interpretation  Date/Time:  Saturday July 24 2018 23:07:40 EDT Ventricular Rate:  88 PR Interval:    QRS Duration: 98 QT Interval:  365 QTC Calculation: 442 R Axis:   -23 Text Interpretation:  Sinus rhythm Probable left atrial enlargement Borderline left axis deviation Nonspecific repol abnormality, diffuse leads Baseline wander in lead(s) V2 V5 V6 No significant change was found Confirmed by Ezequiel Essex [phone]) on 07/24/2018 11:15:22 PM    EMERGENCY DEPARTMENT [place] CARDIAC EXAM "Study: Limited Ultrasound of the Heart and Pericardium"  INDICATIONS:back pain Multiple views of the heart and pericardium were obtained in real-time with a multi-frequency probe.  PERFORMED QI:WLNLGX IMAGES ARCHIVED?: Yes LIMITATIONS:  Body habitus and Emergent procedure VIEWS USED: Subcostal 4 chamber and Parasternal long axis INTERPRETATION: Cardiac activity present, Pericardial effusion  present, Cardiac tamponade absent and Normal contractility questionable dissection flap in aortic root.  CRITICAL CARE Performed by: Ezequiel Essex Total critical care time:60 minutes Critical care time was exclusive of  separately billable procedures and treating other patients. Critical care was necessary to treat or prevent imminent or life-threatening deterioration. Critical care was time spent personally by me on the following activities: development of treatment plan with patient and/or surrogate as well as nursing, discussions with consultants, evaluation of patient's response to treatment, examination of patient, obtaining history from patient or surrogate, ordering and performing treatments and interventions, ordering and review of laboratory studies, ordering and review of radiographic studies, pulse oximetry and re-evaluation of patient's condition.      Ezequiel Essex, MD 07/25/18 [phone]

## 2018-07-25 NOTE — Progress Notes (Signed)
Patient admitted after midnight, please see H&P by Dr. Olevia Bowens. MRI negative so no CVA.  ? Mechanical fall vs arrhythmia/TIA.  Monitor on tele.  Echo/carotids unrevealing.  Orthos negative.  PT/OT eval and hope for d/c after.  Per chart review does have "palpitations" and was recently started on a BB.  ?need for event monitor. Eulogio Bear DO

## 2018-07-25 NOTE — Progress Notes (Signed)
OT Cancellation Note  Patient Details Name: Steve [name] MRN: [id number] DOB: 1936/05/04  Pt off unit at procedure. OT will continue to follow up as schedule allows.   Cancelled Treatment:    Reason Eval/Treat Not Completed: Patient at procedure or test/ unavailable  Curtis Sites OTR/L 07/25/2018, 8:29 AM

## 2018-07-26 DIAGNOSIS — I700 Atherosclerosis of aorta: Secondary | ICD-10-CM | POA: Diagnosis not present

## 2018-07-26 DIAGNOSIS — R531 Weakness: Secondary | ICD-10-CM | POA: Diagnosis not present

## 2018-07-26 DIAGNOSIS — Z794 Long term (current) use of insulin: Secondary | ICD-10-CM | POA: Diagnosis not present

## 2018-07-26 DIAGNOSIS — M1990 Unspecified osteoarthritis, unspecified site: Secondary | ICD-10-CM | POA: Diagnosis not present

## 2018-07-26 DIAGNOSIS — Y92009 Unspecified place in unspecified non-institutional (private) residence as the place of occurrence of the external cause: Secondary | ICD-10-CM

## 2018-07-26 DIAGNOSIS — I672 Cerebral atherosclerosis: Secondary | ICD-10-CM | POA: Diagnosis not present

## 2018-07-26 DIAGNOSIS — I129 Hypertensive chronic kidney disease with stage 1 through stage 4 chronic kidney disease, or unspecified chronic kidney disease: Secondary | ICD-10-CM | POA: Diagnosis not present

## 2018-07-26 DIAGNOSIS — M549 Dorsalgia, unspecified: Secondary | ICD-10-CM | POA: Diagnosis not present

## 2018-07-26 DIAGNOSIS — E785 Hyperlipidemia, unspecified: Secondary | ICD-10-CM | POA: Diagnosis not present

## 2018-07-26 DIAGNOSIS — E1122 Type 2 diabetes mellitus with diabetic chronic kidney disease: Secondary | ICD-10-CM | POA: Diagnosis not present

## 2018-07-26 DIAGNOSIS — N184 Chronic kidney disease, stage 4 (severe): Secondary | ICD-10-CM

## 2018-07-26 DIAGNOSIS — W19XXXA Unspecified fall, initial encounter: Secondary | ICD-10-CM

## 2018-07-26 DIAGNOSIS — I10 Essential (primary) hypertension: Secondary | ICD-10-CM | POA: Diagnosis not present

## 2018-07-26 LAB — GLUCOSE, CAPILLARY
Glucose-Capillary: 125 mg/dL — ABNORMAL HIGH (ref 70–99)
Glucose-Capillary: 117 mg/dL — ABNORMAL HIGH (ref 70–99)

## 2018-07-26 NOTE — Discharge Summary (Addendum)
Physician Discharge Summary  Steve [name] IDP:[id number] DOB: 1936-09-17 DOA: 07/24/2018  PCP: Rosita Fire, MD  Admit date: 07/24/2018 Discharge date: 07/26/2018  Admitted From: home Discharge disposition: home   Recommendations for Outpatient Follow-Up:   1. Home health PT/OT 2. Appears to be mechanical fall plus possible orthostatics-- seems to be dizzy with standing--  Need for BID flomax? 3. ? 30 day event monitor-- no pauses or fib seen on tele in hospital 4. Cbc, bmp 1 week 5. ? Interstitial lung disease on CT scan-- need for pulmonology   Discharge Diagnosis:   Active Problems:   HTN (hypertension)   DM (diabetes mellitus), type 2 with renal complications ([place])   CKD (chronic kidney disease), stage IV ([place])   Hyperlipidemia   Fall at home, initial encounter    Discharge Condition: Improved.  Diet recommendation: Low sodium, heart healthy.  Carbohydrate-modified  Wound care: None.  Code status: Full.   History of Present Illness:  Steve [name] is a 82 y.o. male with medical history significant of OA, CKD, HTN, Grade I diastolic dysfunction, hyperlipidemia, prostate cancer, type 2 diabetes who is coming to the hospital due to having a fall after he got up from bed to go to the bathroom.  He states that he was not sleeping, but as he was returning back to bed he fell hitting his chest wall, left shoulder and and back.  When he tried to get up, he fell and then noticed that his left side was weak.  He states that up earlier in the evening prior to going to bed he did not notice any weakness.  He denies left-sided numbness, headache, blurred vision, slurred speech, difficulty with language comprehension or finding words or dizziness.  He denies fever, chills, sore throat, rhinorrhea, dyspnea, wheezing, anginal chest pain, palpitations, diaphoresis, PND, orthopnea or pitting edema lower extremities.  No abdominal pain, nausea, emesis, diarrhea, constipation,  melena or hematochezia.  Denies dysuria, frequency or hematuria.  No polyuria, polydipsia or polyphagia.  No heat or cold intolerance per   Hospital Course by Problem:   Fall with b/l weakness -story clarified with patient and he denies one side being weaker than another, he did have more pain on the left side from his shoulder -MRI negative for CVA -echo similar to last month -carotids negative -PT/OT- home health -orthos negative but done after IVF and has transient dizziness with PT when getting up-- on flomax BID-- defer to PCP to change -spoke with patient about getting up slowly before walking -tele w/o a fib and no pauses    HTN (hypertension) Continue hydralazine 50 mg p.o. 3 times daily. Continue metoprolol 12.5 mg p.o. twice daily. Continue nifedipine 90 mg p.o. daily.    DM (diabetes mellitus), type 2 with renal complications ([facility]) Carbohydrate modified diet. On Tradjenta 5 mg p.o. daily.    CKD (chronic kidney disease), stage IV ([facility]) Monitor renal function electrolytes.    Hyperlipidemia Continue pravastatin 40 mg p.o. daily  Had some back pain and got a CTA to r/o dissection.  negative for acute aortic pathology.    Medical Consultants:   none   Discharge Exam:   Vitals:   07/25/18 2140 07/26/18 0454  BP: (!) 148/87 (!) 134/55  Pulse: 73 61  Resp: 17 17  Temp: 98.1 F (36.7 C) 98.6 F (37 C)  SpO2: 100% 100%   Vitals:   07/25/18 1602 07/25/18 1800 07/25/18 2140 07/26/18 0454  BP: (!) 156/57 (!) 146/75 Marland Kitchen)  148/87 (!) 134/55  Pulse: 71 77 73 61  Resp: 18 18 17 17   Temp: 98.7 F (37.1 C) 98.9 F (37.2 C) 98.1 F (36.7 C) 98.6 F (37 C)  TempSrc: Oral Oral Oral   SpO2: 100% 100% 100% 100%  Weight:      Height:        General exam: Appears calm and comfortable.   The results of significant diagnostics from this hospitalization (including imaging, microbiology, ancillary and laboratory) are listed below for reference.      Procedures and Diagnostic Studies:   Ct Chest Wo Contrast  Result Date: 07/25/2018 CLINICAL DATA:  82 year old male with history of left-sided weakness. Fall yesterday. EXAM: CT CHEST WITHOUT CONTRAST TECHNIQUE: Multidetector CT imaging of the chest was performed following the standard protocol without IV contrast. COMPARISON:  Chest CT 07/05/2007. FINDINGS: Cardiovascular: Heart size is borderline enlarged. Small amount of anterior pericardial fluid and/or thickening, unlikely to be of any hemodynamic significance at this time. No associated pericardial calcification. There is aortic atherosclerosis, as well as atherosclerosis of the great vessels of the mediastinum and the coronary arteries, including calcified atherosclerotic plaque in the left main, left anterior descending and right coronary arteries. Calcifications of the aortic valve and mitral annulus. Mediastinum/Nodes: No pathologically enlarged mediastinal or hilar lymph nodes. Please note that accurate exclusion of hilar adenopathy is limited on noncontrast CT scans. Some densely calcified mediastinal lymph nodes are incidentally noted. Esophagus is unremarkable in appearance. No axillary lymphadenopathy. Lungs/Pleura: Small calcified granulomas in the left lung. 5 mm subpleural nodule in the periphery of the left upper lobe (axial image 40 of series 4), stable dating back to 2008, considered definitively benign. No other suspicious appearing pulmonary nodules are noted. No acute consolidative airspace disease. No pleural effusions. Diffuse bronchial wall thickening. Widespread areas of septal thickening and mild subpleural reticulation throughout the mid to lower lungs bilaterally. No frank honeycombing. Upper Abdomen: Aortic atherosclerosis. Low-attenuation lesions in the right kidney, incompletely characterized on today's noncontrast CT examination, but statistically likely to represent cysts, largest of which measures up to 3.2 cm in the  upper pole. Musculoskeletal: There are no acute displaced fractures or aggressive appearing lytic or blastic lesions noted in the visualized portions of the skeleton. IMPRESSION: 1. No evidence of significant acute traumatic injury to the thorax. 2. The appearance of the lungs suggests interstitial lung disease. Outpatient referral to Pulmonology for further evaluation is suggested in the near future. 3. Aortic atherosclerosis, in addition to left main and 2 vessel coronary artery disease. 4. There are calcifications of the aortic valve and mitral annulus. Echocardiographic correlation for evaluation of potential valvular dysfunction may be warranted if clinically indicated. Aortic Atherosclerosis (ICD10-I70.0). Electronically Signed   By: Vinnie Langton M.D.   On: 07/25/2018 00:58   Ct Cervical Spine Wo Contrast  Result Date: 07/25/2018 CLINICAL DATA:  Left-sided weakness after a fall yesterday. EXAM: CT CERVICAL SPINE WITHOUT CONTRAST TECHNIQUE: Multidetector CT imaging of the cervical spine was performed without intravenous contrast. Multiplanar CT image reconstructions were also generated. COMPARISON:  Cervical spine radiographs 06/13/2009 FINDINGS: Alignment: There is mild retrolisthesis of C3 on C4 with mild anterolisthesis of C4 on C5 and C5 on C6. This alignment appears unchanged since the previous study and likely represents chronic degenerative change. C1-2 articulation appears intact. Skull base and vertebrae: Skull base appears intact. No vertebral compression deformities. No focal bone lesion or bone destruction. Bone cortex appears intact. Soft tissues and spinal canal: No prevertebral soft tissue  swelling. No abnormal paraspinal soft tissue mass or infiltration. Ligamentous calcification posterior to the spinous processes. Disc levels: Degenerative changes in the cervical spine with narrowed disc spaces and endplate hypertrophic changes most prominent at C3-4, C6-7, and C7-T1 levels.  Uncovertebral spurring causes some bone encroachment upon neural foramina at multiple levels bilaterally. Upper chest: Lung apices are clear. Other: None. IMPRESSION: No acute displaced fractures identified in the cervical spine. Alignment of the cervical spine is unchanged since previous study suggesting chronic degenerative changes. Degenerative changes in the cervical spine. Electronically Signed   By: Lucienne Capers M.D.   On: 07/25/2018 00:48   Mr Brain Wo Contrast  Result Date: 07/25/2018 CLINICAL DATA:  Focal neuro deficit greater than 6 hours. Suspect stroke EXAM: MRI HEAD WITHOUT CONTRAST MRA HEAD WITHOUT CONTRAST TECHNIQUE: Multiplanar, multiecho pulse sequences of the brain and surrounding structures were obtained without intravenous contrast. Angiographic images of the head were obtained using MRA technique without contrast. COMPARISON:  CT head 07/24/2018 FINDINGS: MRI HEAD FINDINGS Brain: Negative for acute infarct. Mild atrophy and mild chronic microvascular ischemic changes in the white matter. Negative for hemorrhage or mass. Vascular: Normal arterial flow voids Skull and upper cervical spine: Disc degeneration and spondylosis C3-4 and C4-5. No bone lesion identified. Sinuses/Orbits: Negative Other: None MRA HEAD FINDINGS Both vertebral arteries patent to the basilar. Basilar widely patent. PICA, superior cerebellar, and posterior cerebral arteries patent bilaterally without stenosis. Atherosclerotic irregularity and mild stenosis in the cavernous carotid bilaterally. Mild atherosclerotic disease in the anterior middle cerebral arteries bilaterally. No flow limiting stenosis or large vessel occlusion. Negative for cerebral aneurysm. IMPRESSION: 1. Negative for acute infarct. Mild atrophy and mild chronic microvascular ischemic changes in the white matter 2. Mild intracranial atherosclerotic disease. Negative for emergent large vessel occlusion. Electronically Signed   By: Franchot Gallo M.D.    On: 07/25/2018 11:21   Mr Jodene Nam Head/brain DJ Cm  Result Date: 07/25/2018 CLINICAL DATA:  Focal neuro deficit greater than 6 hours. Suspect stroke EXAM: MRI HEAD WITHOUT CONTRAST MRA HEAD WITHOUT CONTRAST TECHNIQUE: Multiplanar, multiecho pulse sequences of the brain and surrounding structures were obtained without intravenous contrast. Angiographic images of the head were obtained using MRA technique without contrast. COMPARISON:  CT head 07/24/2018 FINDINGS: MRI HEAD FINDINGS Brain: Negative for acute infarct. Mild atrophy and mild chronic microvascular ischemic changes in the white matter. Negative for hemorrhage or mass. Vascular: Normal arterial flow voids Skull and upper cervical spine: Disc degeneration and spondylosis C3-4 and C4-5. No bone lesion identified. Sinuses/Orbits: Negative Other: None MRA HEAD FINDINGS Both vertebral arteries patent to the basilar. Basilar widely patent. PICA, superior cerebellar, and posterior cerebral arteries patent bilaterally without stenosis. Atherosclerotic irregularity and mild stenosis in the cavernous carotid bilaterally. Mild atherosclerotic disease in the anterior middle cerebral arteries bilaterally. No flow limiting stenosis or large vessel occlusion. Negative for cerebral aneurysm. IMPRESSION: 1. Negative for acute infarct. Mild atrophy and mild chronic microvascular ischemic changes in the white matter 2. Mild intracranial atherosclerotic disease. Negative for emergent large vessel occlusion. Electronically Signed   By: Franchot Gallo M.D.   On: 07/25/2018 11:21   Ct Angio Chest/abd/pel For Dissection W And/or Wo Contrast  Result Date: 07/25/2018 CLINICAL DATA:  Chest pain. EXAM: CT ANGIOGRAPHY CHEST, ABDOMEN AND PELVIS TECHNIQUE: Multidetector CT imaging through the chest, abdomen and pelvis was performed using the standard protocol during bolus administration of intravenous contrast. Multiplanar reconstructed images and MIPs were obtained and reviewed to  evaluate the vascular anatomy.  CONTRAST:  48mL ISOVUE-370 IOPAMIDOL (ISOVUE-370) INJECTION 76% COMPARISON:  Noncontrast CT chest 07/25/2018. CT abdomen and pelvis 03/25/2018. FINDINGS: CTA CHEST FINDINGS Cardiovascular: Images obtained during the arterial phase after contrast administration demonstrates normal caliber thoracic aorta. No evidence of aortic dissection. Scattered aortic calcifications. Mild mural thrombus in the descending aorta with mild ulcerating plaque formation. Mild cardiac enlargement. No pericardial effusions. Central pulmonary arteries are well opacified without evidence of significant pulmonary embolus. Mediastinum/Nodes: Esophageal hiatal hernia. Esophagus is decompressed. Scattered mediastinal lymph nodes are mildly enlarged. Largest pretracheal node measures about 9 mm in diameter. Lungs/Pleura: Interstitial changes in the lung bases, likely fibrosis. Subpleural nodule in the left upper lung measuring 5 mm diameter. No change since prior study. No consolidation or airspace disease. No pleural effusions. No pneumothorax. Airways are patent. Musculoskeletal: No chest wall abnormality. No acute or significant osseous findings. Review of the MIP images confirms the above findings. CTA ABDOMEN AND PELVIS FINDINGS VASCULAR Aorta: Normal caliber aorta without aneurysm, dissection, vasculitis or significant stenosis. Scattered aortic calcifications. Celiac: Patent without evidence of aneurysm, dissection, vasculitis or significant stenosis. SMA: Patent without evidence of aneurysm, dissection, vasculitis or significant stenosis. Renals: Both renal arteries are patent without evidence of aneurysm, dissection, vasculitis, fibromuscular dysplasia or significant stenosis. IMA: Patent without evidence of aneurysm, dissection, vasculitis or significant stenosis. Inflow: Patent without evidence of aneurysm, dissection, vasculitis or significant stenosis. Veins: No obvious venous abnormality within the  limitations of this arterial phase study. Review of the MIP images confirms the above findings. NON-VASCULAR Hepatobiliary: No focal liver abnormality is seen. No gallstones, gallbladder wall thickening, or biliary dilatation. Pancreas: Unremarkable. No pancreatic ductal dilatation or surrounding inflammatory changes. Spleen: Normal in size without focal abnormality. Adrenals/Urinary Tract: No adrenal gland nodules. Renal nephrograms are symmetrical. Bilateral renal cysts. No hydronephrosis or hydroureter. Bladder is unremarkable. Stomach/Bowel: Stomach, small bowel, and colon are not abnormally distended. No wall thickening or inflammatory infiltration. Stool throughout the colon. Diverticulosis of the sigmoid colon without evidence of diverticulitis. Appendix is normal. Lymphatic: No significant lymphadenopathy. Reproductive: Surgical clips versus seed implants in the prostate gland. Correlation with prior history suggested. Other: No free air or free fluid in the abdomen. Abdominal wall musculature appears intact. Musculoskeletal: Degenerative changes in the spine. No destructive bone lesions. Review of the MIP images confirms the above findings. IMPRESSION: 1. No evidence of aortic dissection or pulmonary embolus. 2. Areas of ulcerated plaque formation in the descending thoracic aorta. Aortic calcifications. 3. Stable 5 mm subpleural nodule in the left upper lung. 4. Mild mediastinal adenopathy, nonspecific. 5. No evidence of abdominal aortic dissection or aneurysm. 6. No acute process demonstrated in the abdomen or pelvis. 7. Diverticulosis without evidence of diverticulitis. Electronically Signed   By: Lucienne Capers M.D.   On: 07/25/2018 02:42   Ct Head Code Stroke Wo Contrast  Result Date: 07/24/2018 CLINICAL DATA:  Code stroke.  Fall with muscle weakness EXAM: CT HEAD WITHOUT CONTRAST TECHNIQUE: Contiguous axial images were obtained from the base of the skull through the vertex without intravenous  contrast. COMPARISON:  Head CT 06/07/2016 FINDINGS: Brain: There is no mass, hemorrhage or extra-axial collection. Mild generalized volume loss. There is no acute or chronic infarction. There is hypoattenuation of the periventricular white matter, most commonly indicating chronic ischemic microangiopathy. Vascular: No abnormal hyperdensity of the major intracranial arteries or dural venous sinuses. No intracranial atherosclerosis. Skull: The visualized skull base, calvarium and extracranial soft tissues are normal. Sinuses/Orbits: No fluid levels or advanced mucosal thickening of  the visualized paranasal sinuses. No mastoid or middle ear effusion. The orbits are normal. ASPECTS [facility] Stroke Program Early CT Score) - Ganglionic level infarction (caudate, lentiform nuclei, internal capsule, insula, M1-M3 cortex): 7 - Supraganglionic infarction (M4-M6 cortex): 3 Total score (0-10 with 10 being normal): 10 IMPRESSION: 1. No acute intracranial abnormality. 2. ASPECTS is 10. These results were called by telephone at the time of interpretation on 07/24/2018 at 11:31 pm to Dr. Ezequiel Essex , who verbally acknowledged these results. Electronically Signed   By: Ulyses Jarred M.D.   On: 07/24/2018 23:32     Labs:   Basic Metabolic Panel: Recent Labs  Lab 07/24/18 2326 07/24/18 2334  NA 135 136  K 4.2 4.2  CL 105 105  CO2 21*  --   GLUCOSE 152* 151*  BUN 33* 31*  CREATININE 2.74* 3.00*  CALCIUM 9.2  --    GFR Estimated Creatinine Clearance: 19.6 mL/min (A) (by C-G formula based on SCr of 3 mg/dL (H)). Liver Function Tests: Recent Labs  Lab 07/24/18 2326  AST 21  ALT 20  ALKPHOS 71  BILITOT 0.5  PROT 8.0  ALBUMIN 4.0   No results for input(s): LIPASE, AMYLASE in the last 168 hours. No results for input(s): AMMONIA in the last 168 hours. Coagulation profile Recent Labs  Lab 07/24/18 2326  INR 0.96    CBC: Recent Labs  Lab 07/24/18 2326 07/24/18 2334  WBC 8.7  --   NEUTROABS  6.7  --   HGB 11.8* 11.9*  HCT 36.2* 35.0*  MCV 78.4*  --   PLT 227  --    Cardiac Enzymes: No results for input(s): CKTOTAL, CKMB, CKMBINDEX, TROPONINI in the last 168 hours. BNP: Invalid input(s): POCBNP CBG: Recent Labs  Lab 07/25/18 0735 07/25/18 1153 07/25/18 1642 07/25/18 2140 07/26/18 0814  GLUCAP 102* 105* 231* 118* 125*   D-Dimer No results for input(s): DDIMER in the last 72 hours. Hgb A1c Recent Labs    07/25/18 0715  HGBA1C 6.8*   Lipid Profile Recent Labs    07/25/18 0715  CHOL 149  HDL 46  LDLCALC 89  TRIG 71  CHOLHDL 3.2   Thyroid function studies No results for input(s): TSH, T4TOTAL, T3FREE, THYROIDAB in the last 72 hours.  Invalid input(s): FREET3 Anemia work up No results for input(s): VITAMINB12, FOLATE, FERRITIN, TIBC, IRON, RETICCTPCT in the last 72 hours. Microbiology No results found for this or any previous visit (from the past 240 hour(s)).   Discharge Instructions:   Discharge Instructions    Diet - low sodium heart healthy   Complete by:  As directed    Diet Carb Modified   Complete by:  As directed    Discharge instructions   Complete by:  As directed    Home health   Increase activity slowly   Complete by:  As directed      Allergies as of 07/26/2018   No Known Allergies     Medication List    TAKE these medications   albuterol 108 (90 Base) MCG/ACT inhaler Commonly known as:  PROVENTIL HFA;VENTOLIN HFA Inhale 1-2 puffs into the lungs every 6 (six) hours as needed for wheezing or shortness of breath.   aspirin 81 MG tablet Take 81 mg by mouth daily.   aspirin-acetaminophen-caffeine 250-250-65 MG tablet Commonly known as:  EXCEDRIN MIGRAINE Take 2 tablets by mouth every 6 (six) hours as needed for headache.   docusate sodium 100 MG capsule Commonly known as:  COLACE  Take 100 mg by mouth daily.   gabapentin 300 MG capsule Commonly known as:  NEURONTIN Take 1 capsule by mouth 2 (two) times daily.     hydrALAZINE 25 MG tablet Commonly known as:  APRESOLINE Take 2 tablets (50 mg total) by mouth 3 (three) times daily.   Lysine 500 MG Caps Take 500 mg by mouth daily.   metoprolol tartrate 25 MG tablet Commonly known as:  LOPRESSOR Take 0.5 tablets (12.5 mg total) by mouth 2 (two) times daily.   NIFEdipine 90 MG 24 hr tablet Commonly known as:  PROCARDIA XL/NIFEDICAL-XL TAKE (1) TABLET BY MOUTH ONCE DAILY. What changed:  See the new instructions.   omeprazole 20 MG capsule Commonly known as:  PRILOSEC Take 1 capsule by mouth 2 (two) times daily.   pravastatin 40 MG tablet Commonly known as:  PRAVACHOL Take 1 tablet by mouth every evening.   tamsulosin 0.4 MG Caps capsule Commonly known as:  FLOMAX Take 1 capsule by mouth 2 (two) times daily.   TRADJENTA 5 MG Tabs tablet Generic drug:  linagliptin Take 5 mg by mouth daily.   Vitamin D-3 1000 units Caps Take 1 capsule by mouth once a week. Notes to patient:  Take 1 capsule by mouth once a week      Follow-up Information    Rosita Fire, MD Follow up in 1 week(s).   Specialty:  Internal Medicine Why:  BMP Contact information: [place] [address] [phone]        Herminio Commons, MD .   Specialty:  Cardiology Contact information: [place] [address] [phone]        [facility] Follow up.   Specialty:  [place] Why:  home health services arranged Contact information: [address] [place] [address] [phone]            Time coordinating discharge: 35 min  Signed:  Geradine Girt  [facility] 07/26/2018, 12:45 PM

## 2018-07-26 NOTE — Care Management Obs Status (Signed)
[facility] NOTIFICATION   Patient Details  Name: Steve [name] MRN: [id number] Date of Birth: [birth year]   Medicare Observation Status Notification Given:  Yes    Sharin Mons, RN 07/26/2018, 11:02 AM

## 2018-07-26 NOTE — Evaluation (Signed)
Occupational Therapy Evaluation and Discharge Patient Details Name: Steve [name] MRN: [id number] DOB: 05-18-1936 Today's Date: 07/26/2018    History of Present Illness 82 y.o. male with medical history significant of OA, CKD, HTN, Grade I diastolic dysfunction, hyperlipidemia, prostate cancer, type 2 diabetes who is coming to the hospital due to having a fall after he got up from bed to go to the bathroom, hitting his chest wall, left shoulder and and back. Reported mild L side weakness.  MRI negative and CT of c-spine without change from previous study   Clinical Impression   This 82 yo male admitted with above presents to acute OT at a min guard A level when up on his feet in open spaces due to mild balance deficit and stiff walking--he was very conscious of this and took his time when walking in hallway . He did furniture walk in room and feel he will probably do this in his home for now due to he has been as mobile as normal since being in hospital. [place]. Pt with D/C order for today, so will D/C him from caseload today.     Follow Up Recommendations  Home health OT;Supervision - Intermittent    Equipment Recommendations  None recommended by OT       Precautions / Restrictions Precautions Precautions: Fall Precaution Comments: reports that the fall that brought him in was his only fall Restrictions Weight Bearing Restrictions: No      Mobility Bed Mobility Overal bed mobility: Independent                Transfers Overall transfer level: Needs assistance Equipment used: None Transfers: Sit to/from Stand Sit to Stand: Supervision         General transfer comment: Pt mildly off balance/stiff in his walking with amulating up and down hallway without AD but no LOB (balance not challenged), in room he did do some "furniture" walking    Balance Overall balance assessment: Mild deficits observed, not formally tested                                         ADL either performed or assessed with clinical judgement   ADL Overall ADL's : Needs assistance/impaired Eating/Feeding: Independent;Sitting   Grooming: Supervision/safety;Set up;Standing   Upper Body Bathing: Supervision/ safety;Set up;Standing;Sitting   Lower Body Bathing: Set up;Supervison/ safety;Sit to/from stand   Upper Body Dressing : Set up;Supervision/safety;Sitting   Lower Body Dressing: Supervision/safety;Set up;Sit to/from stand   Toilet Transfer: Min guard;Ambulation;Regular Toilet;Grab bars   Toileting- Clothing Manipulation and Hygiene: Supervision/safety;Sit to/from stand               Vision Patient Visual Report: No change from baseline              Pertinent Vitals/Pain Pain Assessment: (reports still feels a little prickly tingling in across my shoulder blades)     Hand Dominance Right   Extremity/Trunk Assessment Upper Extremity Assessment Upper Extremity Assessment: Overall WFL for tasks assessed           Communication Communication Communication: No difficulties   Cognition Arousal/Alertness: Awake/alert Behavior During Therapy: WFL for tasks assessed/performed Overall Cognitive Status: Within Functional Limits for tasks assessed  Home Living Family/patient expects to be discharged to:: Private residence Living Arrangements: Alone Available Help at Discharge: Friend(s);Neighbor;Available PRN/intermittently Type of Home: Apartment Home Access: Level entry     Home Layout: One level     Bathroom Shower/Tub: Tub/shower unit;Curtain   Biochemist, clinical: Standard         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      Prior Functioning/Environment Level of Independence: Independent                 OT Problem List: Impaired balance (sitting and/or standing)         OT Goals(Current goals can be found in the care plan  section) Acute Rehab OT Goals Patient Stated Goal: to get up and moving to get my strength back  OT Frequency:                AM-PAC PT "6 Clicks" Daily Activity     Outcome Measure Help from another person eating meals?: None Help from another person taking care of personal grooming?: None Help from another person toileting, which includes using toliet, bedpan, or urinal?: A Little Help from another person bathing (including washing, rinsing, drying)?: None Help from another person to put on and taking off regular upper body clothing?: None Help from another person to put on and taking off regular lower body clothing?: None 6 Click Score: 23   End of Session    Activity Tolerance: Patient tolerated treatment well Patient left: in chair;with call bell/phone within reach;with chair alarm set  OT Visit Diagnosis: Unsteadiness on feet (R26.81)                Time: 2395-3202 OT Time Calculation (min): 20 min Charges:  OT General Charges $OT Visit: 1 Visit OT Evaluation $OT Eval Moderate Complexity: 1 Mod  [place], OTR/L Acute [facility] [phone] Office [phone]

## 2018-07-26 NOTE — Discharge Instructions (Signed)
Be sure to get up slowly when changing positions

## 2018-07-26 NOTE — Progress Notes (Signed)
SLP Cancellation Note  Patient Details Name: Steve [name] MRN: [id number] DOB: 30-Aug-1936   Cancelled treatment:       Reason Eval/Treat Not Completed: SLP screened, no needs identified, will sign off (MRI negative for acute changes, pt denies any subjective complaints)   Germain Osgood 07/26/2018, 2:46 PM  Germain Osgood, M.A. [place] Acute Environmental education officer [phone] Office [phone]

## 2018-07-26 NOTE — Care Management CC44 (Signed)
Condition Code 44 Documentation Completed  Patient Details  Name: Steve [name] MRN: [id number] Date of Birth: Aug 05, 1936   Condition Code 44 given:  Yes Patient signature on Condition Code 44 notice:  Yes Documentation of 2 MD's agreement:  Yes Code 44 added to claim:  Yes    Sharin Mons, RN 07/26/2018, 11:02 AM

## 2018-07-26 NOTE — Care Management Note (Signed)
Case Management Note  Patient Details  Name: Steve [name] MRN: [id number] Date of Birth: [birth year]  Subjective/Objective:                  Mechanical fall vs arrhythmia/TIA  Peggye Form (Son) Tommy blackstock [phone]     [facility] [place]  Action/Plan: Transition to home. States son to assist if needed once d/c. Home health services arranged (PT,OT). [facility] will provide home health services. Pt states has transportation to home.  Expected Discharge Date:  07/26/18               Expected Discharge Plan:  [place]  In-House Referral:     Discharge planning Services  CM Consult  Post Acute Care Choice:    Choice offered to:  Patient  DME Arranged:  (declined rolling walker) DME Agency:     HH Arranged:  PT, OT HH Agency:  [facility]  Status of Service:  Completed, signed off  If discussed at [place] of Stay Meetings, dates discussed:    Additional Comments:  Sharin Mons, RN 07/26/2018, 11:07 AM

## 2018-07-28 DIAGNOSIS — Z8546 Personal history of malignant neoplasm of prostate: Secondary | ICD-10-CM | POA: Diagnosis not present

## 2018-07-28 DIAGNOSIS — Z87891 Personal history of nicotine dependence: Secondary | ICD-10-CM | POA: Diagnosis not present

## 2018-07-28 DIAGNOSIS — Z9181 History of falling: Secondary | ICD-10-CM | POA: Diagnosis not present

## 2018-07-28 DIAGNOSIS — I1310 Hypertensive heart and chronic kidney disease without heart failure, with stage 1 through stage 4 chronic kidney disease, or unspecified chronic kidney disease: Secondary | ICD-10-CM | POA: Diagnosis not present

## 2018-07-28 DIAGNOSIS — M1990 Unspecified osteoarthritis, unspecified site: Secondary | ICD-10-CM | POA: Diagnosis not present

## 2018-07-28 DIAGNOSIS — E1122 Type 2 diabetes mellitus with diabetic chronic kidney disease: Secondary | ICD-10-CM | POA: Diagnosis not present

## 2018-07-28 DIAGNOSIS — I672 Cerebral atherosclerosis: Secondary | ICD-10-CM | POA: Diagnosis not present

## 2018-07-28 DIAGNOSIS — N184 Chronic kidney disease, stage 4 (severe): Secondary | ICD-10-CM | POA: Diagnosis not present

## 2018-07-28 DIAGNOSIS — W19XXXD Unspecified fall, subsequent encounter: Secondary | ICD-10-CM | POA: Diagnosis not present

## 2018-07-29 DIAGNOSIS — I1310 Hypertensive heart and chronic kidney disease without heart failure, with stage 1 through stage 4 chronic kidney disease, or unspecified chronic kidney disease: Secondary | ICD-10-CM | POA: Diagnosis not present

## 2018-07-29 DIAGNOSIS — N184 Chronic kidney disease, stage 4 (severe): Secondary | ICD-10-CM | POA: Diagnosis not present

## 2018-07-29 DIAGNOSIS — E1122 Type 2 diabetes mellitus with diabetic chronic kidney disease: Secondary | ICD-10-CM | POA: Diagnosis not present

## 2018-07-29 DIAGNOSIS — W19XXXD Unspecified fall, subsequent encounter: Secondary | ICD-10-CM | POA: Diagnosis not present

## 2018-07-29 DIAGNOSIS — Z9181 History of falling: Secondary | ICD-10-CM | POA: Diagnosis not present

## 2018-07-29 DIAGNOSIS — Z87891 Personal history of nicotine dependence: Secondary | ICD-10-CM | POA: Diagnosis not present

## 2018-07-29 DIAGNOSIS — I672 Cerebral atherosclerosis: Secondary | ICD-10-CM | POA: Diagnosis not present

## 2018-07-29 DIAGNOSIS — M1990 Unspecified osteoarthritis, unspecified site: Secondary | ICD-10-CM | POA: Diagnosis not present

## 2018-07-29 DIAGNOSIS — Z8546 Personal history of malignant neoplasm of prostate: Secondary | ICD-10-CM | POA: Diagnosis not present

## 2018-08-03 DIAGNOSIS — N184 Chronic kidney disease, stage 4 (severe): Secondary | ICD-10-CM | POA: Diagnosis not present

## 2018-08-03 DIAGNOSIS — W19XXXD Unspecified fall, subsequent encounter: Secondary | ICD-10-CM | POA: Diagnosis not present

## 2018-08-03 DIAGNOSIS — R55 Syncope and collapse: Secondary | ICD-10-CM | POA: Diagnosis not present

## 2018-08-03 DIAGNOSIS — E1142 Type 2 diabetes mellitus with diabetic polyneuropathy: Secondary | ICD-10-CM | POA: Diagnosis not present

## 2018-08-04 DIAGNOSIS — M1990 Unspecified osteoarthritis, unspecified site: Secondary | ICD-10-CM | POA: Diagnosis not present

## 2018-08-04 DIAGNOSIS — Z9181 History of falling: Secondary | ICD-10-CM | POA: Diagnosis not present

## 2018-08-04 DIAGNOSIS — N184 Chronic kidney disease, stage 4 (severe): Secondary | ICD-10-CM | POA: Diagnosis not present

## 2018-08-04 DIAGNOSIS — I1310 Hypertensive heart and chronic kidney disease without heart failure, with stage 1 through stage 4 chronic kidney disease, or unspecified chronic kidney disease: Secondary | ICD-10-CM | POA: Diagnosis not present

## 2018-08-04 DIAGNOSIS — Z87891 Personal history of nicotine dependence: Secondary | ICD-10-CM | POA: Diagnosis not present

## 2018-08-04 DIAGNOSIS — Z8546 Personal history of malignant neoplasm of prostate: Secondary | ICD-10-CM | POA: Diagnosis not present

## 2018-08-04 DIAGNOSIS — E1122 Type 2 diabetes mellitus with diabetic chronic kidney disease: Secondary | ICD-10-CM | POA: Diagnosis not present

## 2018-08-04 DIAGNOSIS — I672 Cerebral atherosclerosis: Secondary | ICD-10-CM | POA: Diagnosis not present

## 2018-08-04 DIAGNOSIS — W19XXXD Unspecified fall, subsequent encounter: Secondary | ICD-10-CM | POA: Diagnosis not present

## 2018-08-06 DIAGNOSIS — W19XXXD Unspecified fall, subsequent encounter: Secondary | ICD-10-CM | POA: Diagnosis not present

## 2018-08-06 DIAGNOSIS — E1122 Type 2 diabetes mellitus with diabetic chronic kidney disease: Secondary | ICD-10-CM | POA: Diagnosis not present

## 2018-08-06 DIAGNOSIS — Z8546 Personal history of malignant neoplasm of prostate: Secondary | ICD-10-CM | POA: Diagnosis not present

## 2018-08-06 DIAGNOSIS — N184 Chronic kidney disease, stage 4 (severe): Secondary | ICD-10-CM | POA: Diagnosis not present

## 2018-08-06 DIAGNOSIS — M1990 Unspecified osteoarthritis, unspecified site: Secondary | ICD-10-CM | POA: Diagnosis not present

## 2018-08-06 DIAGNOSIS — I1310 Hypertensive heart and chronic kidney disease without heart failure, with stage 1 through stage 4 chronic kidney disease, or unspecified chronic kidney disease: Secondary | ICD-10-CM | POA: Diagnosis not present

## 2018-08-06 DIAGNOSIS — Z9181 History of falling: Secondary | ICD-10-CM | POA: Diagnosis not present

## 2018-08-06 DIAGNOSIS — I672 Cerebral atherosclerosis: Secondary | ICD-10-CM | POA: Diagnosis not present

## 2018-08-06 DIAGNOSIS — Z87891 Personal history of nicotine dependence: Secondary | ICD-10-CM | POA: Diagnosis not present

## 2018-08-11 DIAGNOSIS — Z87891 Personal history of nicotine dependence: Secondary | ICD-10-CM | POA: Diagnosis not present

## 2018-08-11 DIAGNOSIS — Z8546 Personal history of malignant neoplasm of prostate: Secondary | ICD-10-CM | POA: Diagnosis not present

## 2018-08-11 DIAGNOSIS — M1990 Unspecified osteoarthritis, unspecified site: Secondary | ICD-10-CM | POA: Diagnosis not present

## 2018-08-11 DIAGNOSIS — Z9181 History of falling: Secondary | ICD-10-CM | POA: Diagnosis not present

## 2018-08-11 DIAGNOSIS — W19XXXD Unspecified fall, subsequent encounter: Secondary | ICD-10-CM | POA: Diagnosis not present

## 2018-08-11 DIAGNOSIS — I672 Cerebral atherosclerosis: Secondary | ICD-10-CM | POA: Diagnosis not present

## 2018-08-11 DIAGNOSIS — N184 Chronic kidney disease, stage 4 (severe): Secondary | ICD-10-CM | POA: Diagnosis not present

## 2018-08-11 DIAGNOSIS — I1310 Hypertensive heart and chronic kidney disease without heart failure, with stage 1 through stage 4 chronic kidney disease, or unspecified chronic kidney disease: Secondary | ICD-10-CM | POA: Diagnosis not present

## 2018-08-11 DIAGNOSIS — E1122 Type 2 diabetes mellitus with diabetic chronic kidney disease: Secondary | ICD-10-CM | POA: Diagnosis not present

## 2018-08-13 DIAGNOSIS — I1310 Hypertensive heart and chronic kidney disease without heart failure, with stage 1 through stage 4 chronic kidney disease, or unspecified chronic kidney disease: Secondary | ICD-10-CM | POA: Diagnosis not present

## 2018-08-13 DIAGNOSIS — I672 Cerebral atherosclerosis: Secondary | ICD-10-CM | POA: Diagnosis not present

## 2018-08-13 DIAGNOSIS — Z87891 Personal history of nicotine dependence: Secondary | ICD-10-CM | POA: Diagnosis not present

## 2018-08-13 DIAGNOSIS — M1990 Unspecified osteoarthritis, unspecified site: Secondary | ICD-10-CM | POA: Diagnosis not present

## 2018-08-13 DIAGNOSIS — Z8546 Personal history of malignant neoplasm of prostate: Secondary | ICD-10-CM | POA: Diagnosis not present

## 2018-08-13 DIAGNOSIS — E1122 Type 2 diabetes mellitus with diabetic chronic kidney disease: Secondary | ICD-10-CM | POA: Diagnosis not present

## 2018-08-13 DIAGNOSIS — W19XXXD Unspecified fall, subsequent encounter: Secondary | ICD-10-CM | POA: Diagnosis not present

## 2018-08-13 DIAGNOSIS — Z9181 History of falling: Secondary | ICD-10-CM | POA: Diagnosis not present

## 2018-08-13 DIAGNOSIS — N184 Chronic kidney disease, stage 4 (severe): Secondary | ICD-10-CM | POA: Diagnosis not present

## 2018-08-20 ENCOUNTER — Emergency Department (HOSPITAL_COMMUNITY)
Admission: EM | Admit: 2018-08-20 | Discharge: 2018-08-20 | Disposition: A | Discharge status: Home / Self Care | Payer: Medicare HMO | Attending: Emergency Medicine | Admitting: Emergency Medicine

## 2018-08-20 ENCOUNTER — Other Ambulatory Visit: Payer: Self-pay

## 2018-08-20 ENCOUNTER — Emergency Department (HOSPITAL_COMMUNITY): Payer: Medicare HMO

## 2018-08-20 ENCOUNTER — Encounter (HOSPITAL_COMMUNITY): Payer: Self-pay | Admitting: Emergency Medicine

## 2018-08-20 DIAGNOSIS — I129 Hypertensive chronic kidney disease with stage 1 through stage 4 chronic kidney disease, or unspecified chronic kidney disease: Secondary | ICD-10-CM | POA: Diagnosis not present

## 2018-08-20 DIAGNOSIS — Z87891 Personal history of nicotine dependence: Secondary | ICD-10-CM | POA: Diagnosis not present

## 2018-08-20 DIAGNOSIS — Z8546 Personal history of malignant neoplasm of prostate: Secondary | ICD-10-CM | POA: Diagnosis not present

## 2018-08-20 DIAGNOSIS — N184 Chronic kidney disease, stage 4 (severe): Secondary | ICD-10-CM | POA: Insufficient documentation

## 2018-08-20 DIAGNOSIS — R600 Localized edema: Secondary | ICD-10-CM | POA: Diagnosis not present

## 2018-08-20 DIAGNOSIS — Z7982 Long term (current) use of aspirin: Secondary | ICD-10-CM | POA: Diagnosis not present

## 2018-08-20 DIAGNOSIS — Z79899 Other long term (current) drug therapy: Secondary | ICD-10-CM | POA: Diagnosis not present

## 2018-08-20 DIAGNOSIS — E119 Type 2 diabetes mellitus without complications: Secondary | ICD-10-CM | POA: Diagnosis not present

## 2018-08-20 DIAGNOSIS — J984 Other disorders of lung: Secondary | ICD-10-CM | POA: Diagnosis not present

## 2018-08-20 LAB — CBC WITH DIFFERENTIAL/PLATELET
Abs Immature Granulocytes: 0.04 10*3/uL (ref 0.00–0.07)
Basophils Absolute: 0 10*3/uL (ref 0.0–0.1)
Basophils Relative: 1 %
Eosinophils Absolute: 0.1 10*3/uL (ref 0.0–0.5)
Eosinophils Relative: 1 %
HCT: 33.4 % — ABNORMAL LOW (ref 39.0–52.0)
Hemoglobin: 10.9 g/dL — ABNORMAL LOW (ref 13.0–17.0)
Immature Granulocytes: 1 %
Lymphocytes Relative: 19 %
Lymphs Abs: 1.1 10*3/uL (ref 0.7–4.0)
MCH: 26 pg (ref 26.0–34.0)
MCHC: 32.6 g/dL (ref 30.0–36.0)
MCV: 79.7 fL — ABNORMAL LOW (ref 80.0–100.0)
Monocytes Absolute: 0.7 10*3/uL (ref 0.1–1.0)
Monocytes Relative: 12 %
Neutro Abs: 3.8 10*3/uL (ref 1.7–7.7)
Neutrophils Relative %: 66 %
Platelets: 219 10*3/uL (ref 150–400)
RBC: 4.19 MIL/uL — ABNORMAL LOW (ref 4.22–5.81)
RDW: 13.2 % (ref 11.5–15.5)
WBC: 5.8 10*3/uL (ref 4.0–10.5)
nRBC: 0 % (ref 0.0–0.2)

## 2018-08-20 LAB — BASIC METABOLIC PANEL
Anion gap: 9 (ref 5–15)
BUN: 25 mg/dL — ABNORMAL HIGH (ref 8–23)
CO2: 18 mmol/L — ABNORMAL LOW (ref 22–32)
Calcium: 9 mg/dL (ref 8.9–10.3)
Chloride: 109 mmol/L (ref 98–111)
Creatinine, Ser: 2.67 mg/dL — ABNORMAL HIGH (ref 0.61–1.24)
GFR calc Af Amer: 24 mL/min — ABNORMAL LOW (ref >60)
GFR calc non Af Amer: 21 mL/min — ABNORMAL LOW (ref >60)
Glucose, Bld: 181 mg/dL — ABNORMAL HIGH (ref 70–99)
Potassium: 4.3 mmol/L (ref 3.5–5.1)
Sodium: 136 mmol/L (ref 135–145)

## 2018-08-20 NOTE — ED Provider Notes (Signed)
[facility] EMERGENCY DEPARTMENT Provider Note   CSN: [id number] Arrival date & time: 08/20/18  1744     History   Chief Complaint Chief Complaint  Patient presents with  . Leg Swelling    HPI Steve [name] is a 82 y.o. male.  HPI Patient presents with concern of ongoing bilateral lower extremity swelling. He notes that it is been present for at least the past 2 weeks, and although he initially states that swelling began after minor trauma for which she was evaluated here, it seems as though the swelling began prior to that, has been persistent. No notable change today, but he notes that with the persistency of the swelling over the past 2 weeks, he would like to be evaluated. He denies new chest pain, new syncope, new fever, no chills. No recent medication changes.   Past Medical History:  Diagnosis Date  . Arthritis   . CKD (chronic kidney disease) stage 4, GFR 15-29 ml/min (HCC)   . Essential hypertension   . Hyperlipidemia   . Prostate cancer ([place])   . Renal insufficiency   . Type 2 diabetes mellitus [facility])     Patient Active Problem List   Diagnosis Date Noted  . Fall at home, initial encounter 07/26/2018  . Acute left-sided weakness 07/25/2018  . Hyperlipidemia 07/25/2018  . CKD (chronic kidney disease), stage IV ([place]) 05/13/2018  . Chest pain 05/12/2018  . Elevated troponin 07/02/2017  . DM (diabetes mellitus), type 2 with renal complications ([place]) 27/03/2375  . IBS (irritable bowel syndrome) 01/28/2017  . Prostate cancer ([place]) 07/10/2014  . Abnormal ECG 04/12/2013  . HTN (hypertension) 04/12/2013  . SOB (shortness of breath) 04/12/2013  . CHEST PAIN-UNSPECIFIED 06/12/2009    Past Surgical History:  Procedure Laterality Date  . CATARACT EXTRACTION W/PHACO Right 04/28/2016   Procedure: CATARACT EXTRACTION PHACO AND INTRAOCULAR LENS PLACEMENT (IOC);  Surgeon: Williams Che, MD;  Location: [facility];  Service: Ophthalmology;  Laterality: Right;  CDE: 4.28    . COLONOSCOPY     about 2 years in [place]   . CYSTECTOMY          Home Medications    Prior to Admission medications   Medication Sig Start Date End Date Taking? Authorizing Provider  albuterol (PROVENTIL HFA;VENTOLIN HFA) 108 (90 Base) MCG/ACT inhaler Inhale 1-2 puffs into the lungs every 6 (six) hours as needed for wheezing or shortness of breath.    [provider]  aspirin 81 MG tablet Take 81 mg by mouth daily.    [provider]  aspirin-acetaminophen-caffeine (EXCEDRIN MIGRAINE) [phone] MG tablet Take 2 tablets by mouth every 6 (six) hours as needed for headache.    [provider]  Cholecalciferol (VITAMIN D-3) 1000 UNITS CAPS Take 1 capsule by mouth once a week.     [provider]  docusate sodium (COLACE) 100 MG capsule Take 100 mg by mouth daily.     [provider]  gabapentin (NEURONTIN) 300 MG capsule Take 1 capsule by mouth 2 (two) times daily. 01/19/15   [provider]  hydrALAZINE (APRESOLINE) 25 MG tablet Take 2 tablets (50 mg total) by mouth 3 (three) times daily. 05/13/18 08/11/18  Kathie Dike, MD  linagliptin (TRADJENTA) 5 MG TABS tablet Take 5 mg by mouth daily.    [provider]  Lysine 500 MG CAPS Take 500 mg by mouth daily.    [provider]  metoprolol tartrate (LOPRESSOR) 25 MG tablet Take 0.5 tablets (12.5 mg total)  by mouth 2 (two) times daily. 06/15/18   Strader, Fransisco Hertz, PA-C  NIFEdipine (PROCARDIA XL/ADALAT-CC) 90 MG 24 hr tablet TAKE (1) TABLET BY MOUTH ONCE DAILY. Patient taking differently: Take 90 mg by mouth daily.  06/17/18   Herminio Commons, MD  omeprazole (PRILOSEC) 20 MG capsule Take 1 capsule by mouth 2 (two) times daily. 04/06/13   [provider]  pravastatin (PRAVACHOL) 40 MG tablet Take 1 tablet by mouth every evening.  04/06/13   [provider]  tamsulosin (FLOMAX) 0.4 MG CAPS Take 1 capsule by mouth 2 (two) times daily.  04/06/13   [provider]    Family History Family History  Problem Relation Age of Onset  . Heart disease Mother        cause of death  . Diabetes Sister   . Colon cancer Neg Hx     Social History Social History   Tobacco Use  . Smoking status: Former Smoker    Packs/day: 2.00    Years: 25.00    Pack years: 50.00    Types: Pipe, Cigars    Start date: 11/08/1954    Last attempt to quit: 10/14/1987    Years since quitting: 30.8  . Smokeless tobacco: Never Used  . Tobacco comment: About 2 pipes a day  Substance Use Topics  . Alcohol use: No    Alcohol/week: 0.0 standard drinks  . Drug use: No     Allergies   Patient has no known allergies.   Review of Systems Review of Systems  Constitutional:       Per HPI, otherwise negative  HENT:       Per HPI, otherwise negative  Respiratory:       Per HPI, otherwise negative  Cardiovascular:       Per HPI, otherwise negative  Gastrointestinal: Negative for vomiting.  Endocrine:       Negative aside from HPI  Genitourinary:       Neg aside from HPI   Musculoskeletal:       Per HPI, otherwise negative  Skin: Negative.   Neurological: Negative for syncope.     Physical Exam Updated Vital Signs BP (!) 158/72   Pulse 82   Temp 97.7 F (36.5 C) (Oral)   Resp 20   Ht 5\' 5"  (1.651 m)   Wt 80.3 kg   SpO2 99%   BMI 29.45 kg/m   Physical Exam  Constitutional: He is oriented to person, place, and time. He appears well-developed. No distress.  HENT:  Head: Normocephalic and atraumatic.  Eyes: Conjunctivae and EOM are normal.  Cardiovascular: Normal rate and regular rhythm.  Pulmonary/Chest: Effort normal. No stridor. No respiratory distress.  Abdominal: He exhibits no distension.  Musculoskeletal: He exhibits edema.  Symmetric bilateral lower extremity edema. Patient also complains of right shoulder pain, but moves the elbow and shoulder without apparent limitation throughout his conversation.  Neurological: He is alert and  oriented to person, place, and time.  Skin: Skin is warm and dry.  Psychiatric: He has a normal mood and affect.  Nursing note and vitals reviewed.    ED Treatments / Results  Labs (all labs ordered are listed, but only abnormal results are displayed) Labs Reviewed  CBC WITH DIFFERENTIAL/PLATELET - Abnormal; Notable for the following components:      Result Value   RBC 4.19 (*)    Hemoglobin 10.9 (*)    HCT 33.4 (*)    MCV 79.7 (*)  All other components within normal limits  BASIC METABOLIC PANEL - Abnormal; Notable for the following components:   CO2 18 (*)    Glucose, Bld 181 (*)    BUN 25 (*)    Creatinine, Ser 2.67 (*)    GFR calc non Af Amer 21 (*)    GFR calc Af Amer 24 (*)    All other components within normal limits     Radiology Dg Chest 2 View  Result Date: 08/20/2018 CLINICAL DATA:  Swelling in the legs and ankles.  Fall 1 month ago. EXAM: CHEST - 2 VIEW COMPARISON:  08/12/2018 CT chest FINDINGS: Atherosclerotic calcification of the aortic arch. Low lung volumes are present, causing crowding of the pulmonary vasculature. Mild enlargement of the cardiopericardial silhouette, without edema. No pleural effusion. The lungs appear clear. Thoracic spondylosis noted. IMPRESSION: 1. Mild enlargement of the cardiopericardial silhouette, without edema. 2.  Aortic Atherosclerosis (ICD10-I70.0). 3. Low lung volumes. Electronically Signed   By: Van Clines M.D.   On: 08/20/2018 19:53    Procedures Procedures (including critical care time)  Medications Ordered in ED Medications - No data to display   Initial Impression / Assessment and Plan / ED Course  I have reviewed the triage vital signs and the nursing notes.  Pertinent labs & imaging results that were available during my care of the patient were reviewed by me and considered in my medical decision making (see chart for details).    Review after the initial evaluation notable for hospitalization 1 month ago  for episode of fall. Evaluation reassuring, there was demonstration of CKD, hypertension, diabetes, otherwise reassuring findings.  8:52 PM Patient in no distress, awake, alert. Findings today consistent with multiple prior lab studies, with initiation of chronic kidney disease.  There is no evidence for new asymmetry, DVT, no chest pain, no dyspnea, no evidence for PE, no evidence for infection.  I we discussed all findings with the patient and his granddaughter at length. Patient will expedite follow-up with his nephrologist, but is a Profore discharge with close outpatient follow-up.  Final Clinical Impressions(s) / ED Diagnoses  Bilateral lower extremity swelling   Carmin Muskrat, MD 08/20/18 2052

## 2018-08-20 NOTE — Discharge Instructions (Addendum)
As discussed, your evaluation today has been largely reassuring.  But, it is important that you monitor your condition carefully, and do not hesitate to return to the ED if you develop new, or concerning changes in your condition. ? ?Otherwise, please follow-up with your physician for appropriate ongoing care. ? ?

## 2018-08-20 NOTE — ED Triage Notes (Addendum)
Patient complaining of right leg pain and swelling after a fall 4 weeks ago. Patient ambulatory at triage.

## 2018-08-24 ENCOUNTER — Ambulatory Visit (INDEPENDENT_AMBULATORY_CARE_PROVIDER_SITE_OTHER): Payer: Medicare HMO | Admitting: *Deleted

## 2018-08-24 ENCOUNTER — Telehealth: Payer: Self-pay | Admitting: Cardiovascular Disease

## 2018-08-24 VITALS — BP 160/78 | HR 74

## 2018-08-24 DIAGNOSIS — I493 Ventricular premature depolarization: Secondary | ICD-10-CM

## 2018-08-24 NOTE — Progress Notes (Signed)
Patient walked into office with c/o headache, felt like it was due to his blood pressure.  No c/o chest pain or dizziness.  Does occasionally have SOB, but this is normal for him.  States that he feels a thumping on the right side of head / ear area.  EKG done as well (see scanned into epic).  Stated that he has taken several extra tabs of his Lopressor last evening.  Headache has been off/on x 1 week.    Did get Dr. Harl Bowie to quickly review EKG - appears to be very similar to last one on file.    All info fwd to Dr. Bronson Ing for his review.

## 2018-08-24 NOTE — Progress Notes (Signed)
I reviewed the ECG and it indeed looks similar to previous ECG.  He underwent a low risk nuclear stress test earlier this year when he was hospitalized for chest pain. I reviewed the ED notes as well.  What have his blood pressures been at home?

## 2018-08-24 NOTE — Telephone Encounter (Signed)
Patient walk in   walkin complaining of really bad headache - went to emergency room Friday and still has not gone away. Steve [name] he was told it is from his Blood Pressure

## 2018-08-24 NOTE — Telephone Encounter (Signed)
See nurse visit notes

## 2018-08-25 NOTE — Progress Notes (Signed)
Patient notified and verbalized understanding.  Patient stated that he has not been taking at home recently.  Advised him to take BP readings & HR 3-4 x weeks for 2 week and return log to office for MD review.

## 2018-08-28 DIAGNOSIS — K625 Hemorrhage of anus and rectum: Secondary | ICD-10-CM | POA: Diagnosis not present

## 2018-08-28 DIAGNOSIS — E7800 Pure hypercholesterolemia, unspecified: Secondary | ICD-10-CM | POA: Diagnosis not present

## 2018-08-28 DIAGNOSIS — K648 Other hemorrhoids: Secondary | ICD-10-CM | POA: Diagnosis not present

## 2018-08-28 DIAGNOSIS — Z87891 Personal history of nicotine dependence: Secondary | ICD-10-CM | POA: Diagnosis not present

## 2018-08-28 DIAGNOSIS — Z923 Personal history of irradiation: Secondary | ICD-10-CM | POA: Diagnosis not present

## 2018-08-28 DIAGNOSIS — R195 Other fecal abnormalities: Secondary | ICD-10-CM | POA: Diagnosis not present

## 2018-08-28 DIAGNOSIS — I10 Essential (primary) hypertension: Secondary | ICD-10-CM | POA: Diagnosis not present

## 2018-08-28 DIAGNOSIS — E1140 Type 2 diabetes mellitus with diabetic neuropathy, unspecified: Secondary | ICD-10-CM | POA: Diagnosis not present

## 2018-08-28 DIAGNOSIS — Z8546 Personal history of malignant neoplasm of prostate: Secondary | ICD-10-CM | POA: Diagnosis not present

## 2018-09-07 DIAGNOSIS — R809 Proteinuria, unspecified: Secondary | ICD-10-CM | POA: Diagnosis not present

## 2018-09-07 DIAGNOSIS — Z79899 Other long term (current) drug therapy: Secondary | ICD-10-CM | POA: Diagnosis not present

## 2018-09-07 DIAGNOSIS — D509 Iron deficiency anemia, unspecified: Secondary | ICD-10-CM | POA: Diagnosis not present

## 2018-09-07 DIAGNOSIS — E559 Vitamin D deficiency, unspecified: Secondary | ICD-10-CM | POA: Diagnosis not present

## 2018-09-07 DIAGNOSIS — I129 Hypertensive chronic kidney disease with stage 1 through stage 4 chronic kidney disease, or unspecified chronic kidney disease: Secondary | ICD-10-CM | POA: Diagnosis not present

## 2018-09-07 DIAGNOSIS — N183 Chronic kidney disease, stage 3 (moderate): Secondary | ICD-10-CM | POA: Diagnosis not present

## 2018-09-08 DIAGNOSIS — E1142 Type 2 diabetes mellitus with diabetic polyneuropathy: Secondary | ICD-10-CM | POA: Diagnosis not present

## 2018-09-08 DIAGNOSIS — N184 Chronic kidney disease, stage 4 (severe): Secondary | ICD-10-CM | POA: Diagnosis not present

## 2018-09-14 DIAGNOSIS — E872 Acidosis: Secondary | ICD-10-CM | POA: Diagnosis not present

## 2018-09-14 DIAGNOSIS — D649 Anemia, unspecified: Secondary | ICD-10-CM | POA: Diagnosis not present

## 2018-09-14 DIAGNOSIS — I10 Essential (primary) hypertension: Secondary | ICD-10-CM | POA: Diagnosis not present

## 2018-09-14 DIAGNOSIS — N184 Chronic kidney disease, stage 4 (severe): Secondary | ICD-10-CM | POA: Diagnosis not present

## 2018-09-14 DIAGNOSIS — I509 Heart failure, unspecified: Secondary | ICD-10-CM | POA: Diagnosis not present

## 2018-09-14 DIAGNOSIS — R809 Proteinuria, unspecified: Secondary | ICD-10-CM | POA: Diagnosis not present

## 2018-09-14 DIAGNOSIS — E1129 Type 2 diabetes mellitus with other diabetic kidney complication: Secondary | ICD-10-CM | POA: Diagnosis not present

## 2018-09-24 ENCOUNTER — Telehealth: Payer: Self-pay | Admitting: *Deleted

## 2018-09-24 NOTE — Telephone Encounter (Signed)
Patient notified and verbalized understanding. 

## 2018-09-24 NOTE — Telephone Encounter (Signed)
Herminio Commons, MD  Laurine Blazer, LPN        I reviewed the patient's blood pressure log. I recommend he continue taking hydralazine 3 times a day rather than twice daily.

## 2018-09-27 DIAGNOSIS — J209 Acute bronchitis, unspecified: Secondary | ICD-10-CM | POA: Diagnosis not present

## 2018-09-27 DIAGNOSIS — E1142 Type 2 diabetes mellitus with diabetic polyneuropathy: Secondary | ICD-10-CM | POA: Diagnosis not present

## 2018-09-27 DIAGNOSIS — N184 Chronic kidney disease, stage 4 (severe): Secondary | ICD-10-CM | POA: Diagnosis not present

## 2018-10-07 DIAGNOSIS — K5900 Constipation, unspecified: Secondary | ICD-10-CM | POA: Diagnosis not present

## 2018-10-07 DIAGNOSIS — I129 Hypertensive chronic kidney disease with stage 1 through stage 4 chronic kidney disease, or unspecified chronic kidney disease: Secondary | ICD-10-CM | POA: Diagnosis not present

## 2018-10-07 DIAGNOSIS — K649 Unspecified hemorrhoids: Secondary | ICD-10-CM | POA: Diagnosis not present

## 2018-10-07 DIAGNOSIS — Z87891 Personal history of nicotine dependence: Secondary | ICD-10-CM | POA: Diagnosis not present

## 2018-10-07 DIAGNOSIS — R1084 Generalized abdominal pain: Secondary | ICD-10-CM | POA: Diagnosis not present

## 2018-10-07 DIAGNOSIS — Z79899 Other long term (current) drug therapy: Secondary | ICD-10-CM | POA: Diagnosis not present

## 2018-10-07 DIAGNOSIS — N189 Chronic kidney disease, unspecified: Secondary | ICD-10-CM | POA: Diagnosis not present

## 2018-10-07 DIAGNOSIS — Z7982 Long term (current) use of aspirin: Secondary | ICD-10-CM | POA: Diagnosis not present

## 2018-10-07 DIAGNOSIS — C61 Malignant neoplasm of prostate: Secondary | ICD-10-CM | POA: Diagnosis not present

## 2018-10-07 DIAGNOSIS — K8020 Calculus of gallbladder without cholecystitis without obstruction: Secondary | ICD-10-CM | POA: Diagnosis not present

## 2018-10-07 DIAGNOSIS — R109 Unspecified abdominal pain: Secondary | ICD-10-CM | POA: Diagnosis not present

## 2018-10-07 DIAGNOSIS — N184 Chronic kidney disease, stage 4 (severe): Secondary | ICD-10-CM | POA: Diagnosis not present

## 2018-11-14 ENCOUNTER — Encounter (HOSPITAL_COMMUNITY): Payer: Self-pay

## 2018-11-14 ENCOUNTER — Emergency Department (HOSPITAL_COMMUNITY): Payer: Medicare Other

## 2018-11-14 ENCOUNTER — Other Ambulatory Visit: Payer: Self-pay

## 2018-11-14 ENCOUNTER — Emergency Department (HOSPITAL_COMMUNITY)
Admission: EM | Admit: 2018-11-14 | Discharge: 2018-11-14 | Disposition: A | Discharge status: Home / Self Care | Payer: Medicare Other | Attending: Emergency Medicine | Admitting: Emergency Medicine

## 2018-11-14 DIAGNOSIS — Z87891 Personal history of nicotine dependence: Secondary | ICD-10-CM | POA: Insufficient documentation

## 2018-11-14 DIAGNOSIS — N184 Chronic kidney disease, stage 4 (severe): Secondary | ICD-10-CM | POA: Insufficient documentation

## 2018-11-14 DIAGNOSIS — Z79899 Other long term (current) drug therapy: Secondary | ICD-10-CM | POA: Diagnosis not present

## 2018-11-14 DIAGNOSIS — Z7982 Long term (current) use of aspirin: Secondary | ICD-10-CM | POA: Insufficient documentation

## 2018-11-14 DIAGNOSIS — N189 Chronic kidney disease, unspecified: Secondary | ICD-10-CM

## 2018-11-14 DIAGNOSIS — R0602 Shortness of breath: Secondary | ICD-10-CM | POA: Insufficient documentation

## 2018-11-14 DIAGNOSIS — E1122 Type 2 diabetes mellitus with diabetic chronic kidney disease: Secondary | ICD-10-CM | POA: Diagnosis not present

## 2018-11-14 DIAGNOSIS — I129 Hypertensive chronic kidney disease with stage 1 through stage 4 chronic kidney disease, or unspecified chronic kidney disease: Secondary | ICD-10-CM | POA: Diagnosis not present

## 2018-11-14 DIAGNOSIS — N289 Disorder of kidney and ureter, unspecified: Secondary | ICD-10-CM | POA: Diagnosis not present

## 2018-11-14 LAB — CBC WITH DIFFERENTIAL/PLATELET
Abs Immature Granulocytes: 0.02 10*3/uL (ref 0.00–0.07)
Basophils Absolute: 0 10*3/uL (ref 0.0–0.1)
Basophils Relative: 0 %
Eosinophils Absolute: 0.1 10*3/uL (ref 0.0–0.5)
Eosinophils Relative: 1 %
HCT: 34.4 % — ABNORMAL LOW (ref 39.0–52.0)
Hemoglobin: 11.1 g/dL — ABNORMAL LOW (ref 13.0–17.0)
Immature Granulocytes: 0 %
Lymphocytes Relative: 12 %
Lymphs Abs: 0.6 10*3/uL — ABNORMAL LOW (ref 0.7–4.0)
MCH: 26.1 pg (ref 26.0–34.0)
MCHC: 32.3 g/dL (ref 30.0–36.0)
MCV: 80.8 fL (ref 80.0–100.0)
Monocytes Absolute: 1.1 10*3/uL — ABNORMAL HIGH (ref 0.1–1.0)
Monocytes Relative: 22 %
Neutro Abs: 3.1 10*3/uL (ref 1.7–7.7)
Neutrophils Relative %: 65 %
Platelets: 184 10*3/uL (ref 150–400)
RBC: 4.26 MIL/uL (ref 4.22–5.81)
RDW: 13.6 % (ref 11.5–15.5)
WBC: 4.8 10*3/uL (ref 4.0–10.5)
nRBC: 0 % (ref 0.0–0.2)

## 2018-11-14 LAB — COMPREHENSIVE METABOLIC PANEL
ALT: 26 U/L (ref 0–44)
AST: 22 U/L (ref 15–41)
Albumin: 3.8 g/dL (ref 3.5–5.0)
Alkaline Phosphatase: 56 U/L (ref 38–126)
Anion gap: 9 (ref 5–15)
BUN: 32 mg/dL — ABNORMAL HIGH (ref 8–23)
CO2: 22 mmol/L (ref 22–32)
Calcium: 9.1 mg/dL (ref 8.9–10.3)
Chloride: 110 mmol/L (ref 98–111)
Creatinine, Ser: 3.16 mg/dL — ABNORMAL HIGH (ref 0.61–1.24)
GFR calc Af Amer: 20 mL/min — ABNORMAL LOW (ref >60)
GFR calc non Af Amer: 17 mL/min — ABNORMAL LOW (ref >60)
Glucose, Bld: 118 mg/dL — ABNORMAL HIGH (ref 70–99)
Potassium: 4.4 mmol/L (ref 3.5–5.1)
Sodium: 141 mmol/L (ref 135–145)
Total Bilirubin: 0.5 mg/dL (ref 0.3–1.2)
Total Protein: 7.1 g/dL (ref 6.5–8.1)

## 2018-11-14 LAB — BRAIN NATRIURETIC PEPTIDE: B Natriuretic Peptide: 138 pg/mL — ABNORMAL HIGH (ref 0.0–100.0)

## 2018-11-14 LAB — TROPONIN I: Troponin I: <0.03 ng/mL (ref <0.03)

## 2018-11-14 LAB — CBG MONITORING, ED: Glucose-Capillary: 154 mg/dL — ABNORMAL HIGH (ref 70–99)

## 2018-11-14 MED ORDER — FUROSEMIDE 20 MG PO TABS: 20.0000 mg | ORAL_TABLET | Freq: Every day | ORAL | 0 refills | Status: DC

## 2018-11-14 MED ORDER — FUROSEMIDE 40 MG PO TABS
20.0000 mg | ORAL_TABLET | Freq: Once | ORAL | Status: AC
  Administered 2018-11-14: 20 mg via ORAL
  Filled 2018-11-14: qty 1

## 2018-11-14 NOTE — Discharge Instructions (Signed)
Your testing today was reassuring however it looks like you have extra fluid that needs to be taken off, please take the medication called Lasix, 1 tablet daily for the next 5 days, seek medical exam for increasing shortness of breath or chest pain however I would encourage you to follow-up with your doctor within the next 48 to 72 hours please

## 2018-11-14 NOTE — ED Provider Notes (Signed)
[facility] Provider Note   CSN: [id number] Arrival date & time: 11/14/18  1023     History   Chief Complaint Chief Complaint  Patient presents with  . Shortness of Breath    HPI Steve [name] is a 83 y.o. male.  HPI  The patient is an 83 year old male, he has a known history of chronic kidney disease stage IV as well as essential hypertension, history of prostate cancer, type 2 diabetes and presents today with a complaint of shortness of breath.  He reports that this is been going on for a couple of days, gradually worsening, he had difficulty last night because he could hardly breathe at all.  He denies fevers or chills, he does endorse swelling of the legs which is been progressive over the last couple of days as well.  He denies any prior history of cardiac disease that he is aware of including congestive heart failure.  The patient had been admitted to the hospital with a transient ischemic attack type illness approximately 3 months ago during which time he underwent an echocardiogram showing an ejection fraction of 65 to 70% with grade 1 diastolic dysfunction.  Past Medical History:  Diagnosis Date  . Arthritis   . CKD (chronic kidney disease) stage 4, GFR 15-29 ml/min (HCC)   . Essential hypertension   . Hyperlipidemia   . Prostate cancer ([place])   . Renal insufficiency   . Type 2 diabetes mellitus [facility])     Patient Active Problem List   Diagnosis Date Noted  . Fall at home, initial encounter 07/26/2018  . Acute left-sided weakness 07/25/2018  . Hyperlipidemia 07/25/2018  . CKD (chronic kidney disease), stage IV ([place]) 05/13/2018  . Chest pain 05/12/2018  . Elevated troponin 07/02/2017  . DM (diabetes mellitus), type 2 with renal complications ([place]) 50/27/7412  . IBS (irritable bowel syndrome) 01/28/2017  . Prostate cancer ([place]) 07/10/2014  . Abnormal ECG 04/12/2013  . HTN (hypertension) 04/12/2013  . SOB (shortness of breath) 04/12/2013  .  CHEST PAIN-UNSPECIFIED 06/12/2009    Past Surgical History:  Procedure Laterality Date  . CATARACT EXTRACTION W/PHACO Right 04/28/2016   Procedure: CATARACT EXTRACTION PHACO AND INTRAOCULAR LENS PLACEMENT (IOC);  Surgeon: Williams Che, MD;  Location: [facility];  Service: Ophthalmology;  Laterality: Right;  CDE: 4.28  . COLONOSCOPY     about 2 years in [place]   . CYSTECTOMY          Home Medications    Prior to Admission medications   Medication Sig Start Date End Date Taking? Authorizing Provider  albuterol (PROVENTIL HFA;VENTOLIN HFA) 108 (90 Base) MCG/ACT inhaler Inhale 1-2 puffs into the lungs every 6 (six) hours as needed for wheezing or shortness of breath.    [provider]  aspirin 81 MG tablet Take 81 mg by mouth daily.    [provider]  aspirin-acetaminophen-caffeine (EXCEDRIN MIGRAINE) [phone] MG tablet Take 2 tablets by mouth every 6 (six) hours as needed for headache.    [provider]  Cholecalciferol (VITAMIN D-3) 1000 UNITS CAPS Take 1 capsule by mouth once a week.     [provider]  docusate sodium (COLACE) 100 MG capsule Take 100 mg by mouth daily.     [provider]  gabapentin (NEURONTIN) 300 MG capsule Take 1 capsule by mouth 2 (two) times daily. 01/19/15   [provider]  hydrALAZINE (APRESOLINE) 25 MG tablet Take 2 tablets (50 mg total) by mouth 3 (three) times  daily. 05/13/18 08/11/18  Kathie Dike, MD  linagliptin (TRADJENTA) 5 MG TABS tablet Take 5 mg by mouth daily.    [provider]  Lysine 500 MG CAPS Take 500 mg by mouth daily.    [provider]  metoprolol tartrate (LOPRESSOR) 25 MG tablet Take 0.5 tablets (12.5 mg total) by mouth 2 (two) times daily. 06/15/18   Strader, Fransisco Hertz, PA-C  NIFEdipine (PROCARDIA XL/ADALAT-CC) 90 MG 24 hr tablet TAKE (1) TABLET BY MOUTH ONCE DAILY. Patient taking differently: Take 90 mg by mouth daily.  06/17/18   Herminio Commons, MD    omeprazole (PRILOSEC) 20 MG capsule Take 1 capsule by mouth 2 (two) times daily. 04/06/13   [provider]  pravastatin (PRAVACHOL) 40 MG tablet Take 1 tablet by mouth every evening.  04/06/13   [provider]  tamsulosin (FLOMAX) 0.4 MG CAPS Take 1 capsule by mouth 2 (two) times daily.  04/06/13   [provider]    Family History Family History  Problem Relation Age of Onset  . Heart disease Mother        cause of death  . Diabetes Sister   . Colon cancer Neg Hx     Social History Social History   Tobacco Use  . Smoking status: Former Smoker    Packs/day: 2.00    Years: 25.00    Pack years: 50.00    Types: Pipe, Cigars    Start date: 11/08/1954    Last attempt to quit: 10/14/1987    Years since quitting: 31.1  . Smokeless tobacco: Never Used  . Tobacco comment: About 2 pipes a day  Substance Use Topics  . Alcohol use: No    Alcohol/week: 0.0 standard drinks  . Drug use: No     Allergies   Patient has no known allergies.   Review of Systems Review of Systems  All other systems reviewed and are negative.    Physical Exam Updated Vital Signs BP (!) 169/81 (BP Location: Left Arm)   Pulse 90   Temp 97.7 F (36.5 C) (Oral)   Ht 1.651 m (5\' 5" )   Wt 82.1 kg   SpO2 98%   BMI 30.12 kg/m   Physical Exam Vitals signs and nursing note reviewed.  Constitutional:      General: He is not in acute distress.    Appearance: He is well-developed.  HENT:     Head: Normocephalic and atraumatic.     Mouth/Throat:     Pharynx: No oropharyngeal exudate.  Eyes:     General: No scleral icterus.       Right eye: No discharge.        Left eye: No discharge.     Conjunctiva/sclera: Conjunctivae normal.     Pupils: Pupils are equal, round, and reactive to light.  Neck:     Musculoskeletal: Normal range of motion and neck supple.     Thyroid: No thyromegaly.     Vascular: JVD present.  Cardiovascular:     Rate and Rhythm: Normal rate and  regular rhythm.     Heart sounds: Normal heart sounds. No murmur. No friction rub. No gallop.   Pulmonary:     Effort: Pulmonary effort is normal. No respiratory distress.     Breath sounds: Normal breath sounds. No wheezing or rales.  Abdominal:     General: Bowel sounds are normal. There is no distension.     Palpations: Abdomen is soft. There is no mass.  Tenderness: There is no abdominal tenderness.  Musculoskeletal: Normal range of motion.        General: No tenderness.     Right lower leg: Edema present.     Left lower leg: Edema present.  Lymphadenopathy:     Cervical: No cervical adenopathy.  Skin:    General: Skin is warm and dry.     Findings: No erythema or rash.  Neurological:     Mental Status: He is alert.     Coordination: Coordination normal.  Psychiatric:        Behavior: Behavior normal.      ED Treatments / Results  Labs (all labs ordered are listed, but only abnormal results are displayed) Labs Reviewed  CBG MONITORING, ED - Abnormal; Notable for the following components:      Result Value   Glucose-Capillary 154 (*)    All other components within normal limits  CBC WITH DIFFERENTIAL/PLATELET  COMPREHENSIVE METABOLIC PANEL  BRAIN NATRIURETIC PEPTIDE  TROPONIN I    EKG EKG Interpretation  Date/Time:  Sunday November 14 2018 10:42:22 EST Ventricular Rate:  88 PR Interval:    QRS Duration: 103 QT Interval:  361 QTC Calculation: 432 R Axis:   -21 Text Interpretation:  Sinus rhythm Atrial premature complexes Borderline left axis deviation Repol abnrm suggests ischemia, lateral leads Baseline wander in lead(s) II III aVF V6 since last tracing no significant change Confirmed by Betzabe Bevans ([id number]) on 11/14/2018 11:06:51 AM   Radiology No results found.  Procedures Procedures (including critical care time)  Medications Ordered in ED Medications - No data to display   Initial Impression / Assessment and Plan / ED Course  I have reviewed  the triage vital signs and the nursing notes.  Pertinent labs & imaging results that were available during my care of the patient were reviewed by me and considered in my medical decision making (see chart for details).  Clinical Course as of Nov 14 1402  Sun Nov 14, 2018  1359 WBC: 4.8 [BM]    Clinical Course User Index [BM] Tanazia Achee, MD    BNP is negative, blood counts are reassuring, metabolic panel is reassuring, the creatinine is close to baseline and the BNP is less than 200 suggestive that this is not congestive heart failure.  I see no definite reason that the patient would have some shortness of breath, at this time his vital signs are reassuring, he is not hypoxic, his x-ray shows no pneumonia pneumothorax or pulmonary edema and he will be treated with a small amount of Lasix daily for 5 days to help diurese.  He can follow-up in the outpatient setting, the patient is agreeable.  Vitals:   11/14/18 1100 11/14/18 1130 11/14/18 1200 11/14/18 1230  BP: (!) 151/84 (!) 159/77 (!) 160/74 128/70  Pulse: 75 78 74 85  Resp: 11 18 13 20  Temp:      TempSrc:      SpO2: 95% 99% 99% 96%  Weight:      Height:         Final Clinical Impressions(s) / ED Diagnoses   Final diagnoses:  Shortness of breath  Chronic renal impairment, unspecified CKD stage    ED Discharge Orders         Ordered    furosemide (LASIX) 20 MG tablet  Daily     02 /02/20 1403           Noemi Chapel, MD 11/14/18 1404

## 2018-11-14 NOTE — ED Triage Notes (Signed)
Pt c/o shortness of breath with bilateral ankle swelling with Hx of renal problems.

## 2018-11-16 DIAGNOSIS — N184 Chronic kidney disease, stage 4 (severe): Secondary | ICD-10-CM | POA: Diagnosis not present

## 2018-11-16 DIAGNOSIS — D509 Iron deficiency anemia, unspecified: Secondary | ICD-10-CM | POA: Diagnosis not present

## 2018-11-16 DIAGNOSIS — R809 Proteinuria, unspecified: Secondary | ICD-10-CM | POA: Diagnosis not present

## 2018-11-16 DIAGNOSIS — I10 Essential (primary) hypertension: Secondary | ICD-10-CM | POA: Diagnosis not present

## 2018-11-17 ENCOUNTER — Ambulatory Visit: Payer: Medicare HMO | Admitting: Orthopaedic Surgery

## 2018-11-17 DIAGNOSIS — N184 Chronic kidney disease, stage 4 (severe): Secondary | ICD-10-CM | POA: Diagnosis not present

## 2018-11-17 DIAGNOSIS — E1142 Type 2 diabetes mellitus with diabetic polyneuropathy: Secondary | ICD-10-CM | POA: Diagnosis not present

## 2018-11-17 DIAGNOSIS — R0600 Dyspnea, unspecified: Secondary | ICD-10-CM | POA: Diagnosis not present

## 2018-11-17 DIAGNOSIS — I10 Essential (primary) hypertension: Secondary | ICD-10-CM | POA: Diagnosis not present

## 2018-11-25 ENCOUNTER — Encounter: Payer: Self-pay | Admitting: Orthopaedic Surgery

## 2018-11-25 ENCOUNTER — Ambulatory Visit: Payer: Medicare Other | Admitting: Orthopaedic Surgery

## 2018-11-25 VITALS — BP 124/59 | HR 85 | Ht 65.0 in | Wt 178.0 lb

## 2018-11-25 DIAGNOSIS — E1042 Type 1 diabetes mellitus with diabetic polyneuropathy: Secondary | ICD-10-CM

## 2018-11-25 DIAGNOSIS — E1142 Type 2 diabetes mellitus with diabetic polyneuropathy: Secondary | ICD-10-CM

## 2018-11-25 NOTE — Progress Notes (Signed)
Subjective:    Patient ID: Steve [name], male    DOB: 1936/09/04, 83 y.o.   MRN: [id number]  HPI He comes in complaining of weakness of his legs.  He fell several weeks ago and was seen at the ER here and then at [facility]. He was evaluated for possible stroke.  He was told he did not have one. He has seen Dr. Legrand Rams for leg pain.    He shuffles his feet and often falls.  He says he has diabetes and peripheral neuropathy.  He has seen Dr. Legrand Rams for this.   I have reviewed the notes from Dr. Legrand Rams.  I do not see any record of EMGs.   Review of Systems  Constitutional: Positive for activity change.  Musculoskeletal: Positive for arthralgias and gait problem.  All other systems reviewed and are negative.  For Review of Systems, all other systems reviewed and are negative.  The following is a summary of the past history medically, past history surgically, known current medicines, social history and family history.  This information is gathered electronically by the computer from prior information and documentation.  I review this each visit and have found including this information at this point in the chart is beneficial and informative.   Past Medical History:  Diagnosis Date  . Arthritis   . CKD (chronic kidney disease) stage 4, GFR 15-29 ml/min (HCC)   . Essential hypertension   . Hyperlipidemia   . Prostate cancer ([place])   . Renal insufficiency   . Type 2 diabetes mellitus ([place])     Past Surgical History:  Procedure Laterality Date  . CATARACT EXTRACTION W/PHACO Right 04/28/2016   Procedure: CATARACT EXTRACTION PHACO AND INTRAOCULAR LENS PLACEMENT (IOC);  Surgeon: Williams Che, MD;  Location: [facility];  Service: Ophthalmology;  Laterality: Right;  CDE: 4.28  . COLONOSCOPY     about 2 years in [place]   . CYSTECTOMY      Current Outpatient Medications on File Prior to Visit  Medication Sig Dispense Refill  . albuterol (PROVENTIL HFA;VENTOLIN HFA) 108 (90 Base) MCG/ACT inhaler  Inhale 1-2 puffs into the lungs every 6 (six) hours as needed for wheezing or shortness of breath.    . Ascorbic Acid (VITAMIN C) 1000 MG tablet Take by mouth.    Marland Kitchen aspirin 81 MG tablet Take 81 mg by mouth daily.    Marland Kitchen aspirin-acetaminophen-caffeine (EXCEDRIN MIGRAINE) 250-250-65 MG tablet Take 2 tablets by mouth every 6 (six) hours as needed for headache.    . Cholecalciferol (VITAMIN D-3) 1000 UNITS CAPS Take 1 capsule by mouth once a week.     . docusate sodium (COLACE) 100 MG capsule Take 100 mg by mouth daily.     . furosemide (LASIX) 20 MG tablet Take 1 tablet (20 mg total) by mouth daily for 5 days. 5 tablet 0  . gabapentin (NEURONTIN) 300 MG capsule Take 1 capsule by mouth 2 (two) times daily.    . hydrALAZINE (APRESOLINE) 50 MG tablet     . linagliptin (TRADJENTA) 5 MG TABS tablet Take 5 mg by mouth daily.    Marland Kitchen Lysine 500 MG CAPS Take 500 mg by mouth daily.    . metoprolol tartrate (LOPRESSOR) 25 MG tablet Take 0.5 tablets (12.5 mg total) by mouth 2 (two) times daily. 90 tablet 3  . NIFEdipine (PROCARDIA XL/ADALAT-CC) 90 MG 24 hr tablet TAKE (1) TABLET BY MOUTH ONCE DAILY. 30 tablet 6  . omeprazole (PRILOSEC) 20 MG capsule Take 1  capsule by mouth 2 (two) times daily.    . pravastatin (PRAVACHOL) 40 MG tablet Take 1 tablet by mouth every evening.     . tamsulosin (FLOMAX) 0.4 MG CAPS Take 1 capsule by mouth daily.     . hydrALAZINE (APRESOLINE) 25 MG tablet Take 2 tablets (50 mg total) by mouth 3 (three) times daily.     No current facility-administered medications on file prior to visit.     Social History   Socioeconomic History  . Marital status: Divorced    Spouse name: Not on file  . Number of children: Not on file  . Years of education: Not on file  . Highest education level: Not on file  Occupational History  . Occupation: Retired  Scientific laboratory technician  . Financial resource strain: Not on file  . Food insecurity:    Worry: Not on file    Inability: Not on file  .  Transportation needs:    Medical: Not on file    Non-medical: Not on file  Tobacco Use  . Smoking status: Former Smoker    Packs/day: 2.00    Years: 25.00    Pack years: 50.00    Types: Pipe, Cigars    Start date: 11/08/1954    Last attempt to quit: 10/14/1987    Years since quitting: 31.1  . Smokeless tobacco: Never Used  . Tobacco comment: About 2 pipes a day  Substance and Sexual Activity  . Alcohol use: No    Alcohol/week: 0.0 standard drinks  . Drug use: No  . Sexual activity: Not on file  Lifestyle  . Physical activity:    Days per week: Not on file    Minutes per session: Not on file  . Stress: Not on file  Relationships  . Social connections:    Talks on phone: Not on file    Gets together: Not on file    Attends religious service: Not on file    Active member of club or organization: Not on file    Attends meetings of clubs or organizations: Not on file    Relationship status: Not on file  . Intimate partner violence:    Fear of current or ex partner: Not on file    Emotionally abused: Not on file    Physically abused: Not on file    Forced sexual activity: Not on file  Other Topics Concern  . Not on file  Social History Narrative  . Not on file    Family History  Problem Relation Age of Onset  . Heart disease Mother        cause of death  . Diabetes Sister   . Colon cancer Neg Hx     BP (!) 124/59   Pulse 85   Ht 5\' 5"  (1.651 m)   Wt 178 lb (80.7 kg)   BMI 29.62 kg/m   Body mass index is 29.62 kg/m.      Objective:   Physical Exam Constitutional:      Appearance: He is well-developed.  HENT:     Head: Normocephalic and atraumatic.  Eyes:     Conjunctiva/sclera: Conjunctivae normal.     Pupils: Pupils are equal, round, and reactive to light.  Neck:     Musculoskeletal: Normal range of motion and neck supple.  Cardiovascular:     Rate and Rhythm: Normal rate and regular rhythm.  Pulmonary:     Effort: Pulmonary effort is normal.    Abdominal:  Palpations: Abdomen is soft.  Skin:    General: Skin is warm and dry.  Neurological:     Mental Status: He is alert and oriented to person, place, and time.     Cranial Nerves: No cranial nerve deficit.     Motor: No abnormal muscle tone.     Coordination: Coordination normal.     Deep Tendon Reflexes: Reflexes are normal and symmetric. Reflexes normal.  Psychiatric:        Behavior: Behavior normal.        Thought Content: Thought content normal.        Judgment: Judgment normal.   He shuffles his feet when walking.  He has a slow gait.  His peroneal nerve function is normal sitting but after walking a while it is weak.  I observed him walking up and down our long hallway today.  I am concerned he has peroneal nerve weakness as part of the peripheral neuropathy.  He may need AFOs.  I will get EMGs.  I have explained this to the patient.        Assessment & Plan:   Encounter Diagnoses  Name Primary?  . Diabetic peripheral neuropathy associated with type 1 diabetes mellitus ([place]) Yes  . Diabetic peripheral neuropathy ([place])    Return after the EMGs.  Consider the AFO then.  Call if any problem.  Precautions discussed.   Electronically Signed Sanjuana Kava, MD 2/13/202010:15 AM

## 2018-12-01 ENCOUNTER — Emergency Department (HOSPITAL_COMMUNITY)
Admission: EM | Admit: 2018-12-01 | Discharge: 2018-12-01 | Disposition: A | Discharge status: Home / Self Care | Payer: Medicare Other | Attending: Emergency Medicine | Admitting: Emergency Medicine

## 2018-12-01 ENCOUNTER — Other Ambulatory Visit: Payer: Self-pay

## 2018-12-01 ENCOUNTER — Emergency Department (HOSPITAL_COMMUNITY): Payer: Medicare Other

## 2018-12-01 ENCOUNTER — Encounter (HOSPITAL_COMMUNITY): Payer: Self-pay | Admitting: Emergency Medicine

## 2018-12-01 ENCOUNTER — Encounter: Payer: Self-pay | Admitting: *Deleted

## 2018-12-01 DIAGNOSIS — E119 Type 2 diabetes mellitus without complications: Secondary | ICD-10-CM | POA: Insufficient documentation

## 2018-12-01 DIAGNOSIS — I129 Hypertensive chronic kidney disease with stage 1 through stage 4 chronic kidney disease, or unspecified chronic kidney disease: Secondary | ICD-10-CM | POA: Diagnosis not present

## 2018-12-01 DIAGNOSIS — N184 Chronic kidney disease, stage 4 (severe): Secondary | ICD-10-CM | POA: Diagnosis not present

## 2018-12-01 DIAGNOSIS — Z87891 Personal history of nicotine dependence: Secondary | ICD-10-CM | POA: Insufficient documentation

## 2018-12-01 DIAGNOSIS — Z79899 Other long term (current) drug therapy: Secondary | ICD-10-CM | POA: Insufficient documentation

## 2018-12-01 DIAGNOSIS — E785 Hyperlipidemia, unspecified: Secondary | ICD-10-CM | POA: Diagnosis not present

## 2018-12-01 DIAGNOSIS — R079 Chest pain, unspecified: Secondary | ICD-10-CM | POA: Diagnosis not present

## 2018-12-01 LAB — BASIC METABOLIC PANEL
Anion gap: 11 (ref 5–15)
BUN: 41 mg/dL — ABNORMAL HIGH (ref 8–23)
CO2: 20 mmol/L — ABNORMAL LOW (ref 22–32)
Calcium: 9.2 mg/dL (ref 8.9–10.3)
Chloride: 107 mmol/L (ref 98–111)
Creatinine, Ser: 3.78 mg/dL — ABNORMAL HIGH (ref 0.61–1.24)
GFR calc Af Amer: 16 mL/min — ABNORMAL LOW (ref >60)
GFR calc non Af Amer: 14 mL/min — ABNORMAL LOW (ref >60)
Glucose, Bld: 112 mg/dL — ABNORMAL HIGH (ref 70–99)
Potassium: 4.6 mmol/L (ref 3.5–5.1)
Sodium: 138 mmol/L (ref 135–145)

## 2018-12-01 LAB — CBC
HCT: 36.5 % — ABNORMAL LOW (ref 39.0–52.0)
Hemoglobin: 11.7 g/dL — ABNORMAL LOW (ref 13.0–17.0)
MCH: 25.8 pg — ABNORMAL LOW (ref 26.0–34.0)
MCHC: 32.1 g/dL (ref 30.0–36.0)
MCV: 80.6 fL (ref 80.0–100.0)
Platelets: 253 10*3/uL (ref 150–400)
RBC: 4.53 MIL/uL (ref 4.22–5.81)
RDW: 13.5 % (ref 11.5–15.5)
WBC: 6 10*3/uL (ref 4.0–10.5)
nRBC: 0 % (ref 0.0–0.2)

## 2018-12-01 LAB — TROPONIN I
Troponin I: <0.03 ng/mL (ref <0.03)
Troponin I: <0.03 ng/mL (ref <0.03)

## 2018-12-01 MED ORDER — SODIUM CHLORIDE 0.9% FLUSH: 3.0000 mL | Freq: Once | INTRAVENOUS | Status: DC

## 2018-12-01 NOTE — ED Triage Notes (Signed)
CP on LT side of chest on and off for past 2 days.  Today pain would not go away.  Took nitro sub lingual x 2 with no relief.

## 2018-12-01 NOTE — Progress Notes (Signed)
Patient walked into office with c/o active pain/cramping in chest rated 7/10 and SOB that started last night. Patient said he took 2 nitroglycerin with no relief. No c/o dizziness. Patient advised to go to the ED now. Patient was accompanied by a family member/friend and he was also advised to take patient to ED now. Verbalized understanding of plan. DOD aware.

## 2018-12-02 NOTE — Progress Notes (Signed)
Agree with ER evaluation   J Merrillyn Ackerley MD 

## 2018-12-07 NOTE — ED Provider Notes (Signed)
[facility] EMERGENCY DEPARTMENT Provider Note   CSN: [id number] Arrival date & time: 12/01/18  [place]    History   Chief Complaint Chief Complaint  Patient presents with  . Chest Pain    HPI Steve [name] is a 83 y.o. male.     HPI   53yM with CP. L sided. Onset about ten days ago. Cannot remember what was doing when he first noticed it. Initially intermittent but has been constant since this morning. No appreciable exacerbating or relieving factors. Tried nitroglycerin w/o noticeable change. No acute respiratory complaints. Denies nausea, palpitations or diaphoresis. No unusual leg pain or swelling.   Past Medical History:  Diagnosis Date  . Arthritis   . CKD (chronic kidney disease) stage 4, GFR 15-29 ml/min (HCC)   . Essential hypertension   . Hyperlipidemia   . Prostate cancer ([place])   . Renal insufficiency   . Type 2 diabetes mellitus [facility])     Patient Active Problem List   Diagnosis Date Noted  . Fall at home, initial encounter 07/26/2018  . Acute left-sided weakness 07/25/2018  . Hyperlipidemia 07/25/2018  . CKD (chronic kidney disease), stage IV ([place]) 05/13/2018  . Chronic kidney disease, stage 4 (severe) ([place]) 05/13/2018  . Chest pain 05/12/2018  . Elevated troponin 07/02/2017  . DM (diabetes mellitus), type 2 with renal complications ([place]) 65/53/7482  . IBS (irritable bowel syndrome) 01/28/2017  . Prostate cancer ([place]) 07/10/2014  . Abnormal ECG 04/12/2013  . HTN (hypertension) 04/12/2013  . SOB (shortness of breath) 04/12/2013  . Type 2 diabetes mellitus with diabetic nephropathy ([place]) 01/11/2013  . CHEST PAIN-UNSPECIFIED 06/12/2009    Past Surgical History:  Procedure Laterality Date  . CATARACT EXTRACTION W/PHACO Right 04/28/2016   Procedure: CATARACT EXTRACTION PHACO AND INTRAOCULAR LENS PLACEMENT (IOC);  Surgeon: Williams Che, MD;  Location: [facility] ORS;  Service: Ophthalmology;  Laterality: Right;  CDE: 4.28  . COLONOSCOPY     about 2 years in  [place]   . CYSTECTOMY          Home Medications    Prior to Admission medications   Medication Sig Start Date End Date Taking? Authorizing Provider  albuterol (PROVENTIL HFA;VENTOLIN HFA) 108 (90 Base) MCG/ACT inhaler Inhale 1-2 puffs into the lungs every 6 (six) hours as needed for wheezing or shortness of breath.    [provider]  Ascorbic Acid (VITAMIN C) 1000 MG tablet Take by mouth.    [provider]  aspirin 81 MG tablet Take 81 mg by mouth daily.    [provider]  aspirin-acetaminophen-caffeine (EXCEDRIN MIGRAINE) [phone] MG tablet Take 2 tablets by mouth every 6 (six) hours as needed for headache.    [provider]  Cholecalciferol (VITAMIN D-3) 1000 UNITS CAPS Take 1 capsule by mouth once a week.     [provider]  docusate sodium (COLACE) 100 MG capsule Take 100 mg by mouth daily.     [provider]  furosemide (LASIX) 20 MG tablet Take 1 tablet (20 mg total) by mouth daily for 5 days. 11/14/18 11/25/18  Noemi Chapel, MD  gabapentin (NEURONTIN) 300 MG capsule Take 1 capsule by mouth 2 (two) times daily. 01/19/15   [provider]  hydrALAZINE (APRESOLINE) 25 MG tablet Take 2 tablets (50 mg total) by mouth 3 (three) times daily. 05/13/18 11/14/18  Kathie Dike, MD  hydrALAZINE (APRESOLINE) 50 MG tablet  11/17/18   [provider]  linagliptin (TRADJENTA) 5 MG TABS tablet Take 5 mg  by mouth daily.    [provider]  Lysine 500 MG CAPS Take 500 mg by mouth daily.    [provider]  metoprolol tartrate (LOPRESSOR) 25 MG tablet Take 0.5 tablets (12.5 mg total) by mouth 2 (two) times daily. 06/15/18   Strader, Fransisco Hertz, PA-C  NIFEdipine (PROCARDIA XL/ADALAT-CC) 90 MG 24 hr tablet TAKE (1) TABLET BY MOUTH ONCE DAILY. 06/17/18   Herminio Commons, MD  omeprazole (PRILOSEC) 20 MG capsule Take 1 capsule by mouth 2 (two) times daily. 04/06/13   [provider]  pravastatin (PRAVACHOL) 40  MG tablet Take 1 tablet by mouth every evening.  04/06/13   [provider]  tamsulosin (FLOMAX) 0.4 MG CAPS Take 1 capsule by mouth daily.  04/06/13   [provider]    Family History Family History  Problem Relation Age of Onset  . Heart disease Mother        cause of death  . Diabetes Sister   . Colon cancer Neg Hx     Social History Social History   Tobacco Use  . Smoking status: Former Smoker    Packs/day: 2.00    Years: 25.00    Pack years: 50.00    Types: Pipe, Cigars    Start date: 11/08/1954    Last attempt to quit: 10/14/1987    Years since quitting: 31.1  . Smokeless tobacco: Never Used  . Tobacco comment: About 2 pipes a day  Substance Use Topics  . Alcohol use: No    Alcohol/week: 0.0 standard drinks  . Drug use: No     Allergies   Patient has no known allergies.   Review of Systems Review of Systems  All systems reviewed and negative, other than as noted in HPI.  Physical Exam Updated Vital Signs BP (!) 149/111   Pulse 68   Temp 97.6 F (36.4 C)   Resp 15   Ht 5\' 5"  (1.651 m)   Wt 81.6 kg   SpO2 97%   BMI 29.95 kg/m   Physical Exam Vitals signs and nursing note reviewed.  Constitutional:      General: He is not in acute distress.    Appearance: He is well-developed.  HENT:     Head: Normocephalic and atraumatic.  Eyes:     General:        Right eye: No discharge.        Left eye: No discharge.     Conjunctiva/sclera: Conjunctivae normal.  Neck:     Musculoskeletal: Neck supple.  Cardiovascular:     Rate and Rhythm: Normal rate and regular rhythm.     Heart sounds: Normal heart sounds. No murmur. No friction rub. No gallop.   Pulmonary:     Effort: Pulmonary effort is normal. No respiratory distress.     Breath sounds: Normal breath sounds.  Abdominal:     General: There is no distension.     Palpations: Abdomen is soft.     Tenderness: There is no abdominal tenderness.  Musculoskeletal:        General: No  tenderness.  Skin:    General: Skin is warm and dry.  Neurological:     Mental Status: He is alert.  Psychiatric:        Behavior: Behavior normal.        Thought Content: Thought content normal.      ED Treatments / Results  Labs (all labs ordered are listed, but only abnormal results are displayed) Labs  Reviewed  BASIC METABOLIC PANEL - Abnormal; Notable for the following components:      Result Value   CO2 20 (*)    Glucose, Bld 112 (*)    BUN 41 (*)    Creatinine, Ser 3.78 (*)    GFR calc non Af Amer 14 (*)    GFR calc Af Amer 16 (*)    All other components within normal limits  CBC - Abnormal; Notable for the following components:   Hemoglobin 11.7 (*)    HCT 36.5 (*)    MCH 25.8 (*)    All other components within normal limits  TROPONIN I  TROPONIN I    EKG EKG Interpretation  Date/Time:  Wednesday December 01 2018 16:57:19 EST Ventricular Rate:  69 PR Interval:  158 QRS Duration: 96 QT Interval:  384 QTC Calculation: 411 R Axis:   -2 Text Interpretation:  Sinus rhythm with marked sinus arrhythmia Nonspecific ST and T wave abnormality Abnormal ECG No significant change since last tracing Confirmed by Virgel Manifold [phone]) on 12/01/2018 9:00:39 PM   Radiology No results found.   Dg Chest 2 View  Result Date: 12/01/2018 CLINICAL DATA:  Intermittent left-sided chest pain EXAM: CHEST - 2 VIEW COMPARISON:  11/14/2018 FINDINGS: Stable cardiomegaly with mild uncoiling of the thoracic aorta. Aortic atherosclerosis is noted at the arch. Streaky parenchymal opacities are noted at each lung base compatible with atelectasis. No acute pulmonary consolidation or edema. No effusion or pneumothorax. No acute osseous abnormality. IMPRESSION: 1. Stable cardiomegaly with aortic atherosclerosis. Bibasilar atelectasis. 2. Lungs otherwise clear. Electronically Signed   By: Ashley Royalty M.D.   On: 12/01/2018 17:42   Dg Chest 2 View  Result Date: 11/14/2018 CLINICAL DATA:  Short  of breath and weakness today. EXAM: CHEST - 2 VIEW COMPARISON:  08/20/2018 FINDINGS: Cardiac silhouette borderline enlarged. No mediastinal or hilar masses. No evidence of adenopathy. Clear lungs.  No pleural effusion or pneumothorax. Skeletal structures are intact. IMPRESSION: No active cardiopulmonary disease. Electronically Signed   By: Lajean Manes M.D.   On: 11/14/2018 11:54    Procedures Procedures (including critical care time)  Medications Ordered in ED Medications - No data to display   Initial Impression / Assessment and Plan / ED Course  I have reviewed the triage vital signs and the nursing notes.  Pertinent labs & imaging results that were available during my care of the patient were reviewed by me and considered in my medical decision making (see chart for details).  83yM with chest pain. Doubt ACS, PE, dissection or other emergent process. EKG unchanged from prior. Trop normal x2 with constant pain since this morning. Afebrile. Normal o2 sats. No acute respiratory complaints. Aside from BP, vitals are fine.   It has been determined that no acute conditions requiring further emergency intervention are present at this time. The patient has been advised of the diagnosis and plan. I reviewed any labs and imaging including any potential incidental findings. I have reviewed nursing notes and appropriate previous records. We have discussed signs and symptoms that warrant return to the ED and they are listed in the discharge instructions.       Final Clinical Impressions(s) / ED Diagnoses   Final diagnoses:  Chest pain, unspecified type    ED Discharge Orders    None       Virgel Manifold, MD 12/08/18 [phone]

## 2018-12-09 ENCOUNTER — Ambulatory Visit (INDEPENDENT_AMBULATORY_CARE_PROVIDER_SITE_OTHER): Payer: Medicare Other | Admitting: Otolaryngology

## 2018-12-09 ENCOUNTER — Encounter: Payer: Self-pay | Admitting: Orthopaedic Surgery

## 2018-12-09 DIAGNOSIS — G629 Polyneuropathy, unspecified: Secondary | ICD-10-CM | POA: Diagnosis not present

## 2018-12-14 ENCOUNTER — Encounter: Payer: Self-pay | Admitting: Orthopaedic Surgery

## 2018-12-14 ENCOUNTER — Ambulatory Visit: Payer: Medicare Other | Admitting: Orthopaedic Surgery

## 2018-12-14 VITALS — BP 143/73 | HR 53 | Ht 65.0 in | Wt 173.0 lb

## 2018-12-14 DIAGNOSIS — E1042 Type 1 diabetes mellitus with diabetic polyneuropathy: Secondary | ICD-10-CM | POA: Diagnosis not present

## 2018-12-14 NOTE — Progress Notes (Signed)
Patient ID:Steve [name], male DOB:1935/11/13, 83 y.o. FHL:[id number]  Chief Complaint  Patient presents with  . Leg Problem    HPI  Steve [name] is a 83 y.o. male who has weakness of the lower extremities more on the left consistent with peripheral neuropathy secondary to diabetes.  He had EMGs which show a moderate sensory and motor axonal and demyelinating peripheral neuropathy.  I have explained to him what this means.  I watched him walk.  He does not bring the left foot up very well.  I am afraid he may stumble on it.  I will give Rx for a AFO brace.  He understands this.  Body mass index is 28.79 kg/m.  ROS  Review of Systems  Constitutional: Positive for activity change.  Musculoskeletal: Positive for arthralgias and gait problem.  All other systems reviewed and are negative.   All other systems reviewed and are negative.  The following is a summary of the past history medically, past history surgically, known current medicines, social history and family history.  This information is gathered electronically by the computer from prior information and documentation.  I review this each visit and have found including this information at this point in the chart is beneficial and informative.    Past Medical History:  Diagnosis Date  . Arthritis   . CKD (chronic kidney disease) stage 4, GFR 15-29 ml/min (HCC)   . Essential hypertension   . Hyperlipidemia   . Prostate cancer ([place])   . Renal insufficiency   . Type 2 diabetes mellitus ([place])     Past Surgical History:  Procedure Laterality Date  . CATARACT EXTRACTION W/PHACO Right 04/28/2016   Procedure: CATARACT EXTRACTION PHACO AND INTRAOCULAR LENS PLACEMENT (IOC);  Surgeon: Williams Che, MD;  Location: [facility];  Service: Ophthalmology;  Laterality: Right;  CDE: 4.28  . COLONOSCOPY     about 2 years in [place]   . CYSTECTOMY      Family History  Problem Relation Age of Onset  . Heart disease Mother        cause of  death  . Diabetes Sister   . Colon cancer Neg Hx     Social History Social History   Tobacco Use  . Smoking status: Former Smoker    Packs/day: 2.00    Years: 25.00    Pack years: 50.00    Types: Pipe, Cigars    Start date: 11/08/1954    Last attempt to quit: 10/14/1987    Years since quitting: 31.1  . Smokeless tobacco: Never Used  . Tobacco comment: About 2 pipes a day  Substance Use Topics  . Alcohol use: No    Alcohol/week: 0.0 standard drinks  . Drug use: No    No Known Allergies  Current Outpatient Medications  Medication Sig Dispense Refill  . albuterol (PROVENTIL HFA;VENTOLIN HFA) 108 (90 Base) MCG/ACT inhaler Inhale 1-2 puffs into the lungs every 6 (six) hours as needed for wheezing or shortness of breath.    . Ascorbic Acid (VITAMIN C) 1000 MG tablet Take by mouth.    Marland Kitchen aspirin 81 MG tablet Take 81 mg by mouth daily.    Marland Kitchen aspirin-acetaminophen-caffeine (EXCEDRIN MIGRAINE) 250-250-65 MG tablet Take 2 tablets by mouth every 6 (six) hours as needed for headache.    . Cholecalciferol (VITAMIN D-3) 1000 UNITS CAPS Take 1 capsule by mouth once a week.     . docusate sodium (COLACE) 100 MG capsule Take 100 mg by mouth daily.     Marland Kitchen  furosemide (LASIX) 20 MG tablet Take 1 tablet (20 mg total) by mouth daily for 5 days. 5 tablet 0  . gabapentin (NEURONTIN) 300 MG capsule Take 1 capsule by mouth 2 (two) times daily.    . hydrALAZINE (APRESOLINE) 25 MG tablet Take 2 tablets (50 mg total) by mouth 3 (three) times daily.    . hydrALAZINE (APRESOLINE) 50 MG tablet     . linagliptin (TRADJENTA) 5 MG TABS tablet Take 5 mg by mouth daily.    Marland Kitchen Lysine 500 MG CAPS Take 500 mg by mouth daily.    . metoprolol tartrate (LOPRESSOR) 25 MG tablet Take 0.5 tablets (12.5 mg total) by mouth 2 (two) times daily. 90 tablet 3  . NIFEdipine (PROCARDIA XL/ADALAT-CC) 90 MG 24 hr tablet TAKE (1) TABLET BY MOUTH ONCE DAILY. 30 tablet 6  . omeprazole (PRILOSEC) 20 MG capsule Take 1 capsule by mouth 2  (two) times daily.    . pravastatin (PRAVACHOL) 40 MG tablet Take 1 tablet by mouth every evening.     . tamsulosin (FLOMAX) 0.4 MG CAPS Take 1 capsule by mouth daily.      No current facility-administered medications for this visit.      Physical Exam  Blood pressure (!) 143/73, pulse (!) 53, height 5\' 5"  (1.651 m), weight 173 lb (78.5 kg).  Constitutional: overall normal hygiene, normal nutrition, well developed, normal grooming, normal body habitus. Assistive device:none  Musculoskeletal: gait and station Limp left slightly, muscle tone and strength are normal, no tremors or atrophy is present. But he does not raise the left foot up as well as on the right.  Motor is intact and he has normal peroneal function while sitting. .  Neurological: coordination overall normal.  Deep tendon reflex/nerve stretch intact.  Sensation abnormal of the feet, decreased equally.  Cranial nerves II-XII intact.   Skin:   Normal overall no scars, lesions, ulcers or rashes. No psoriasis.  Psychiatric: Alert and oriented x 3.  Recent memory intact, remote memory unclear.  Normal mood and affect. Well groomed.  Good eye contact.  Cardiovascular: overall no swelling, no varicosities, no edema bilaterally, normal temperatures of the legs and arms, no clubbing, cyanosis and good capillary refill.  Lymphatic: palpation is normal.  All other systems reviewed and are negative   The patient has been educated about the nature of the problem(s) and counseled on treatment options.  The patient appeared to understand what I have discussed and is in agreement with it.  Encounter Diagnosis  Name Primary?  . Diabetic peripheral neuropathy associated with type 1 diabetes mellitus ([place]) Yes    PLAN Call if any problems.  Precautions discussed.  Continue current medications.   Return to clinic 1 month   Get AFO brace.  Rx given.  Electronically Signed Sanjuana Kava, MD 3/3/202010:29 AM

## 2019-01-01 DIAGNOSIS — T7840XA Allergy, unspecified, initial encounter: Secondary | ICD-10-CM | POA: Diagnosis not present

## 2019-01-01 DIAGNOSIS — E7800 Pure hypercholesterolemia, unspecified: Secondary | ICD-10-CM | POA: Diagnosis not present

## 2019-01-01 DIAGNOSIS — Z79899 Other long term (current) drug therapy: Secondary | ICD-10-CM | POA: Diagnosis not present

## 2019-01-01 DIAGNOSIS — Z7982 Long term (current) use of aspirin: Secondary | ICD-10-CM | POA: Diagnosis not present

## 2019-01-01 DIAGNOSIS — I10 Essential (primary) hypertension: Secondary | ICD-10-CM | POA: Diagnosis not present

## 2019-01-01 DIAGNOSIS — E1140 Type 2 diabetes mellitus with diabetic neuropathy, unspecified: Secondary | ICD-10-CM | POA: Diagnosis not present

## 2019-01-01 DIAGNOSIS — S40861A Insect bite (nonvenomous) of right upper arm, initial encounter: Secondary | ICD-10-CM | POA: Diagnosis not present

## 2019-01-03 ENCOUNTER — Telehealth: Payer: Self-pay | Admitting: Orthopaedic Surgery

## 2019-01-03 NOTE — Telephone Encounter (Signed)
He is to [facility], he said he has gone to be measured and they were going to call him back, but have not. I have provided him the phone number so he can call and ck on this.

## 2019-01-03 NOTE — Telephone Encounter (Signed)
Patient called to relay that "the place we referred him to in [place]" hasn't called him back yet. I reviewed with him the referral to Dr Orie Rout, for EEG/NCS, and he gave a different date for this appointment, however, he has been seen here for review of those results.  Michela Pitcher he was referred to an office on [address] in [place], and that they had to check with his insurance ([employer]). Is he referring to [facility]? Chart note mentions AFO brace, and his follow up appointment on 01/13/19 refers to AFO brace.

## 2019-01-12 DIAGNOSIS — M21372 Foot drop, left foot: Secondary | ICD-10-CM | POA: Diagnosis not present

## 2019-01-13 ENCOUNTER — Ambulatory Visit: Payer: Medicare Other | Admitting: Orthopaedic Surgery

## 2019-01-14 DIAGNOSIS — N184 Chronic kidney disease, stage 4 (severe): Secondary | ICD-10-CM | POA: Diagnosis not present

## 2019-01-14 DIAGNOSIS — I10 Essential (primary) hypertension: Secondary | ICD-10-CM | POA: Diagnosis not present

## 2019-01-14 DIAGNOSIS — E1142 Type 2 diabetes mellitus with diabetic polyneuropathy: Secondary | ICD-10-CM | POA: Diagnosis not present

## 2019-02-02 ENCOUNTER — Other Ambulatory Visit: Payer: Self-pay

## 2019-02-02 ENCOUNTER — Encounter: Payer: Self-pay | Admitting: Orthopaedic Surgery

## 2019-02-02 ENCOUNTER — Ambulatory Visit: Payer: Medicare Other | Admitting: Orthopaedic Surgery

## 2019-02-02 ENCOUNTER — Ambulatory Visit (INDEPENDENT_AMBULATORY_CARE_PROVIDER_SITE_OTHER): Payer: Medicare Other

## 2019-02-02 VITALS — BP 172/80 | HR 68 | Temp 97.6°F | Ht 65.0 in | Wt 180.0 lb

## 2019-02-02 DIAGNOSIS — E1042 Type 1 diabetes mellitus with diabetic polyneuropathy: Secondary | ICD-10-CM | POA: Diagnosis not present

## 2019-02-02 DIAGNOSIS — M21372 Foot drop, left foot: Secondary | ICD-10-CM | POA: Diagnosis not present

## 2019-02-02 DIAGNOSIS — M25561 Pain in right knee: Secondary | ICD-10-CM | POA: Diagnosis not present

## 2019-02-02 NOTE — Progress Notes (Signed)
Patient ID:Steve [name], male DOB:[birth year], 83 y.o. SFS:[id number]  Chief Complaint  Patient presents with  . Foot Problem    HPI  Steve [name] is a 83 y.o. male who has a left foot drop secondary to diabetes peripheral neuropathy.  He got an AFO brace and is here to check it out.  He is walking much better with it on.  He is getting used to it.  He has problem with the right knee hurting and swelling some.  He has no trauma.  He uses a cane.  He has not really done anything for it.   Body mass index is 29.95 kg/m.  ROS  Review of Systems  Constitutional: Positive for activity change.  Musculoskeletal: Positive for arthralgias and gait problem.  All other systems reviewed and are negative.   All other systems reviewed and are negative.  The following is a summary of the past history medically, past history surgically, known current medicines, social history and family history.  This information is gathered electronically by the computer from prior information and documentation.  I review this each visit and have found including this information at this point in the chart is beneficial and informative.    Past Medical History:  Diagnosis Date  . Arthritis   . CKD (chronic kidney disease) stage 4, GFR 15-29 ml/min (HCC)   . Essential hypertension   . Hyperlipidemia   . Prostate cancer ([place])   . Renal insufficiency   . Type 2 diabetes mellitus ([place])     Past Surgical History:  Procedure Laterality Date  . CATARACT EXTRACTION W/PHACO Right 04/28/2016   Procedure: CATARACT EXTRACTION PHACO AND INTRAOCULAR LENS PLACEMENT (IOC);  Surgeon: Williams Che, MD;  Location: [facility];  Service: Ophthalmology;  Laterality: Right;  CDE: 4.28  . COLONOSCOPY     about 2 years in [place]   . CYSTECTOMY      Family History  Problem Relation Age of Onset  . Heart disease Mother        cause of death  . Diabetes Sister   . Colon cancer Neg Hx     Social History Social History    Tobacco Use  . Smoking status: Former Smoker    Packs/day: 2.00    Years: 25.00    Pack years: 50.00    Types: Pipe, Cigars    Start date: 11/08/1954    Last attempt to quit: 10/14/1987    Years since quitting: 31.3  . Smokeless tobacco: Never Used  . Tobacco comment: About 2 pipes a day  Substance Use Topics  . Alcohol use: No    Alcohol/week: 0.0 standard drinks  . Drug use: No    No Known Allergies  Current Outpatient Medications  Medication Sig Dispense Refill  . albuterol (PROVENTIL HFA;VENTOLIN HFA) 108 (90 Base) MCG/ACT inhaler Inhale 1-2 puffs into the lungs every 6 (six) hours as needed for wheezing or shortness of breath.    . Ascorbic Acid (VITAMIN C) 1000 MG tablet Take by mouth.    Marland Kitchen aspirin 81 MG tablet Take 81 mg by mouth daily.    Marland Kitchen aspirin-acetaminophen-caffeine (EXCEDRIN MIGRAINE) 250-250-65 MG tablet Take 2 tablets by mouth every 6 (six) hours as needed for headache.    . Cholecalciferol (VITAMIN D-3) 1000 UNITS CAPS Take 1 capsule by mouth once a week.     . docusate sodium (COLACE) 100 MG capsule Take 100 mg by mouth daily.     . furosemide (LASIX) 20 MG tablet Take  1 tablet (20 mg total) by mouth daily for 5 days. 5 tablet 0  . gabapentin (NEURONTIN) 300 MG capsule Take 1 capsule by mouth 2 (two) times daily.    . hydrALAZINE (APRESOLINE) 25 MG tablet Take 2 tablets (50 mg total) by mouth 3 (three) times daily.    . hydrALAZINE (APRESOLINE) 50 MG tablet     . linagliptin (TRADJENTA) 5 MG TABS tablet Take 5 mg by mouth daily.    Marland Kitchen Lysine 500 MG CAPS Take 500 mg by mouth daily.    . metoprolol tartrate (LOPRESSOR) 25 MG tablet Take 0.5 tablets (12.5 mg total) by mouth 2 (two) times daily. 90 tablet 3  . NIFEdipine (PROCARDIA XL/ADALAT-CC) 90 MG 24 hr tablet TAKE (1) TABLET BY MOUTH ONCE DAILY. 30 tablet 6  . omeprazole (PRILOSEC) 20 MG capsule Take 1 capsule by mouth 2 (two) times daily.    . pravastatin (PRAVACHOL) 40 MG tablet Take 1 tablet by mouth every  evening.     . tamsulosin (FLOMAX) 0.4 MG CAPS Take 1 capsule by mouth daily.      No current facility-administered medications for this visit.      Physical Exam  Blood pressure (!) 172/80, pulse 68, temperature 97.6 F (36.4 C), height 5\' 5"  (1.651 m), weight 180 lb (81.6 kg).  Constitutional: overall normal hygiene, normal nutrition, well developed, normal grooming, normal body habitus. Assistive device:cane  Musculoskeletal: gait and station Limp right, muscle tone and strength are abnormal with weakness of the left peroneal nerve and foot drop, no tremors or atrophy is present.  .  Neurological: coordination overall abnormal.  Deep tendon reflex/nerve stretch intact.  Sensation normal.  Cranial nerves II-XII intact.   Skin:   Normal overall no scars, lesions, ulcers or rashes. No psoriasis.  Psychiatric: Alert and oriented x 3.  Recent memory intact, remote memory unclear.  Normal mood and affect. Well groomed.  Good eye contact.  Cardiovascular: overall no swelling, no varicosities, no edema bilaterally, normal temperatures of the legs and arms, no clubbing, cyanosis and good capillary refill.  Lymphatic: palpation is normal.  He walks well with the AFO on and he has no tripping on the left side like he did before.  He has a slight limp to the right.  He has a cane but hardly uses it.  He has some swelling of the right knee but no crepitus. ROM is nearly full.  NV on the right intact.  All other systems reviewed and are negative   The patient has been educated about the nature of the problem(s) and counseled on treatment options.  The patient appeared to understand what I have discussed and is in agreement with it.  Encounter Diagnoses  Name Primary?  . Acute pain of right knee Yes  . Diabetic peripheral neuropathy associated with type 1 diabetes mellitus ([place])   . Left foot drop     X-rays were done of the right knee, reported separately.   PLAN Call if any  problems.  Precautions discussed.  Continue current medications.   Return to clinic 1 month   Continue the AFO.  Electronically Signed Sanjuana Kava, MD 4/22/20209:03 AM

## 2019-02-17 ENCOUNTER — Encounter: Payer: Self-pay | Admitting: Orthopaedic Surgery

## 2019-02-17 ENCOUNTER — Ambulatory Visit: Payer: Medicare Other | Admitting: Orthopaedic Surgery

## 2019-02-17 ENCOUNTER — Other Ambulatory Visit: Payer: Self-pay

## 2019-02-17 VITALS — Temp 98.0°F

## 2019-02-17 DIAGNOSIS — M25561 Pain in right knee: Secondary | ICD-10-CM | POA: Diagnosis not present

## 2019-02-17 DIAGNOSIS — G8929 Other chronic pain: Secondary | ICD-10-CM | POA: Diagnosis not present

## 2019-02-17 NOTE — Progress Notes (Signed)
CC:  I have pain of my right knee. I would like an injection.  The patient has chronic pain of the right knee.  There is no recent trauma.  There is no redness.  Injections in the past have helped.  The knee has no redness, has an effusion and crepitus present.  ROM of the right knee is 0-105.  Impression:  Chronic knee pain right  Return: as needed  PROCEDURE NOTE:  The patient requests injections of the right knee , verbal consent was obtained.  The right knee was prepped appropriately after time out was performed.   Sterile technique was observed and injection of 1 cc of Depo-Medrol 40 mg with several cc's of plain xylocaine. Anesthesia was provided by ethyl chloride and a 20-gauge needle was used to inject the knee area. The injection was tolerated well.  A band aid dressing was applied.  The patient was advised to apply ice later today and tomorrow to the injection sight as needed.  Electronically Signed Sanjuana Kava, MD 5/7/20209:08 AM

## 2019-02-21 ENCOUNTER — Other Ambulatory Visit: Payer: Self-pay

## 2019-02-21 DIAGNOSIS — N184 Chronic kidney disease, stage 4 (severe): Secondary | ICD-10-CM | POA: Diagnosis not present

## 2019-02-21 DIAGNOSIS — I10 Essential (primary) hypertension: Secondary | ICD-10-CM | POA: Diagnosis not present

## 2019-02-21 DIAGNOSIS — K219 Gastro-esophageal reflux disease without esophagitis: Secondary | ICD-10-CM | POA: Diagnosis not present

## 2019-02-21 DIAGNOSIS — E1142 Type 2 diabetes mellitus with diabetic polyneuropathy: Secondary | ICD-10-CM | POA: Diagnosis not present

## 2019-02-21 NOTE — Patient Outreach (Signed)
[facility]) Care Management  02/21/2019  Steve [name] Jul 13, 1936 [id number]   Medication Adherence call to Steve [name] HIPPA Compliant Voice message left with a call back number.patient is showing past due on Losartan 100 mg under [place].   [facility] Management Direct Dial [phone]  Fax [phone] [name]

## 2019-03-02 ENCOUNTER — Other Ambulatory Visit: Payer: Self-pay

## 2019-03-02 ENCOUNTER — Encounter: Payer: Self-pay | Admitting: Orthopaedic Surgery

## 2019-03-02 ENCOUNTER — Ambulatory Visit (INDEPENDENT_AMBULATORY_CARE_PROVIDER_SITE_OTHER): Payer: Medicare Other | Admitting: Orthopaedic Surgery

## 2019-03-02 DIAGNOSIS — M25561 Pain in right knee: Secondary | ICD-10-CM | POA: Diagnosis not present

## 2019-03-02 DIAGNOSIS — G8929 Other chronic pain: Secondary | ICD-10-CM | POA: Diagnosis not present

## 2019-03-02 DIAGNOSIS — M21372 Foot drop, left foot: Secondary | ICD-10-CM

## 2019-03-02 DIAGNOSIS — E1042 Type 1 diabetes mellitus with diabetic polyneuropathy: Secondary | ICD-10-CM

## 2019-03-02 NOTE — Progress Notes (Signed)
Virtual Visit via Telephone Note  I connected with Steve [name] on 03/02/19 at  9:00 AM EDT by telephone and verified that I am speaking with the correct person using two identifiers.  Location: Patient: at home Provider: at home   I discussed the limitations, risks, security and privacy concerns of performing an evaluation and management service by telephone and the availability of in person appointments. I also discussed with the patient that there may be a patient responsible charge related to this service. The patient expressed understanding and agreed to proceed.   History of Present Illness: His right knee is better after the injection.  He has no new trauma.  He has swelling and popping.  He is wearing his AFO brace with no problem.   Observations/Objective: Per above  Assessment and Plan: Encounter Diagnoses  Name Primary?  . Chronic pain of right knee Yes  . Diabetic peripheral neuropathy associated with type 1 diabetes mellitus ([place])   . Left foot drop      Follow Up Instructions: As needed.   I discussed the assessment and treatment plan with the patient. The patient was provided an opportunity to ask questions and all were answered. The patient agreed with the plan and demonstrated an understanding of the instructions.   The patient was advised to call back or seek an in-person evaluation if the symptoms worsen or if the condition fails to improve as anticipated.  I provided 7 minutes of non-face-to-face time during this encounter.   Sanjuana Kava, MD

## 2019-03-03 DIAGNOSIS — I10 Essential (primary) hypertension: Secondary | ICD-10-CM | POA: Diagnosis not present

## 2019-03-03 DIAGNOSIS — N184 Chronic kidney disease, stage 4 (severe): Secondary | ICD-10-CM | POA: Diagnosis not present

## 2019-03-03 DIAGNOSIS — K219 Gastro-esophageal reflux disease without esophagitis: Secondary | ICD-10-CM | POA: Diagnosis not present

## 2019-03-03 DIAGNOSIS — E785 Hyperlipidemia, unspecified: Secondary | ICD-10-CM | POA: Diagnosis not present

## 2019-03-03 DIAGNOSIS — E1142 Type 2 diabetes mellitus with diabetic polyneuropathy: Secondary | ICD-10-CM | POA: Diagnosis not present

## 2019-03-03 DIAGNOSIS — I129 Hypertensive chronic kidney disease with stage 1 through stage 4 chronic kidney disease, or unspecified chronic kidney disease: Secondary | ICD-10-CM | POA: Diagnosis not present

## 2019-03-23 IMAGING — DX DG CHEST 2V
2 series · 2 of 2 positions shown · non-contrast
Comparison: 08/12/2018 CT chest

CLINICAL DATA: Swelling in the legs and ankles.  Fall 1 month ago.

EXAM:
CHEST - 2 VIEW

[chest lat]
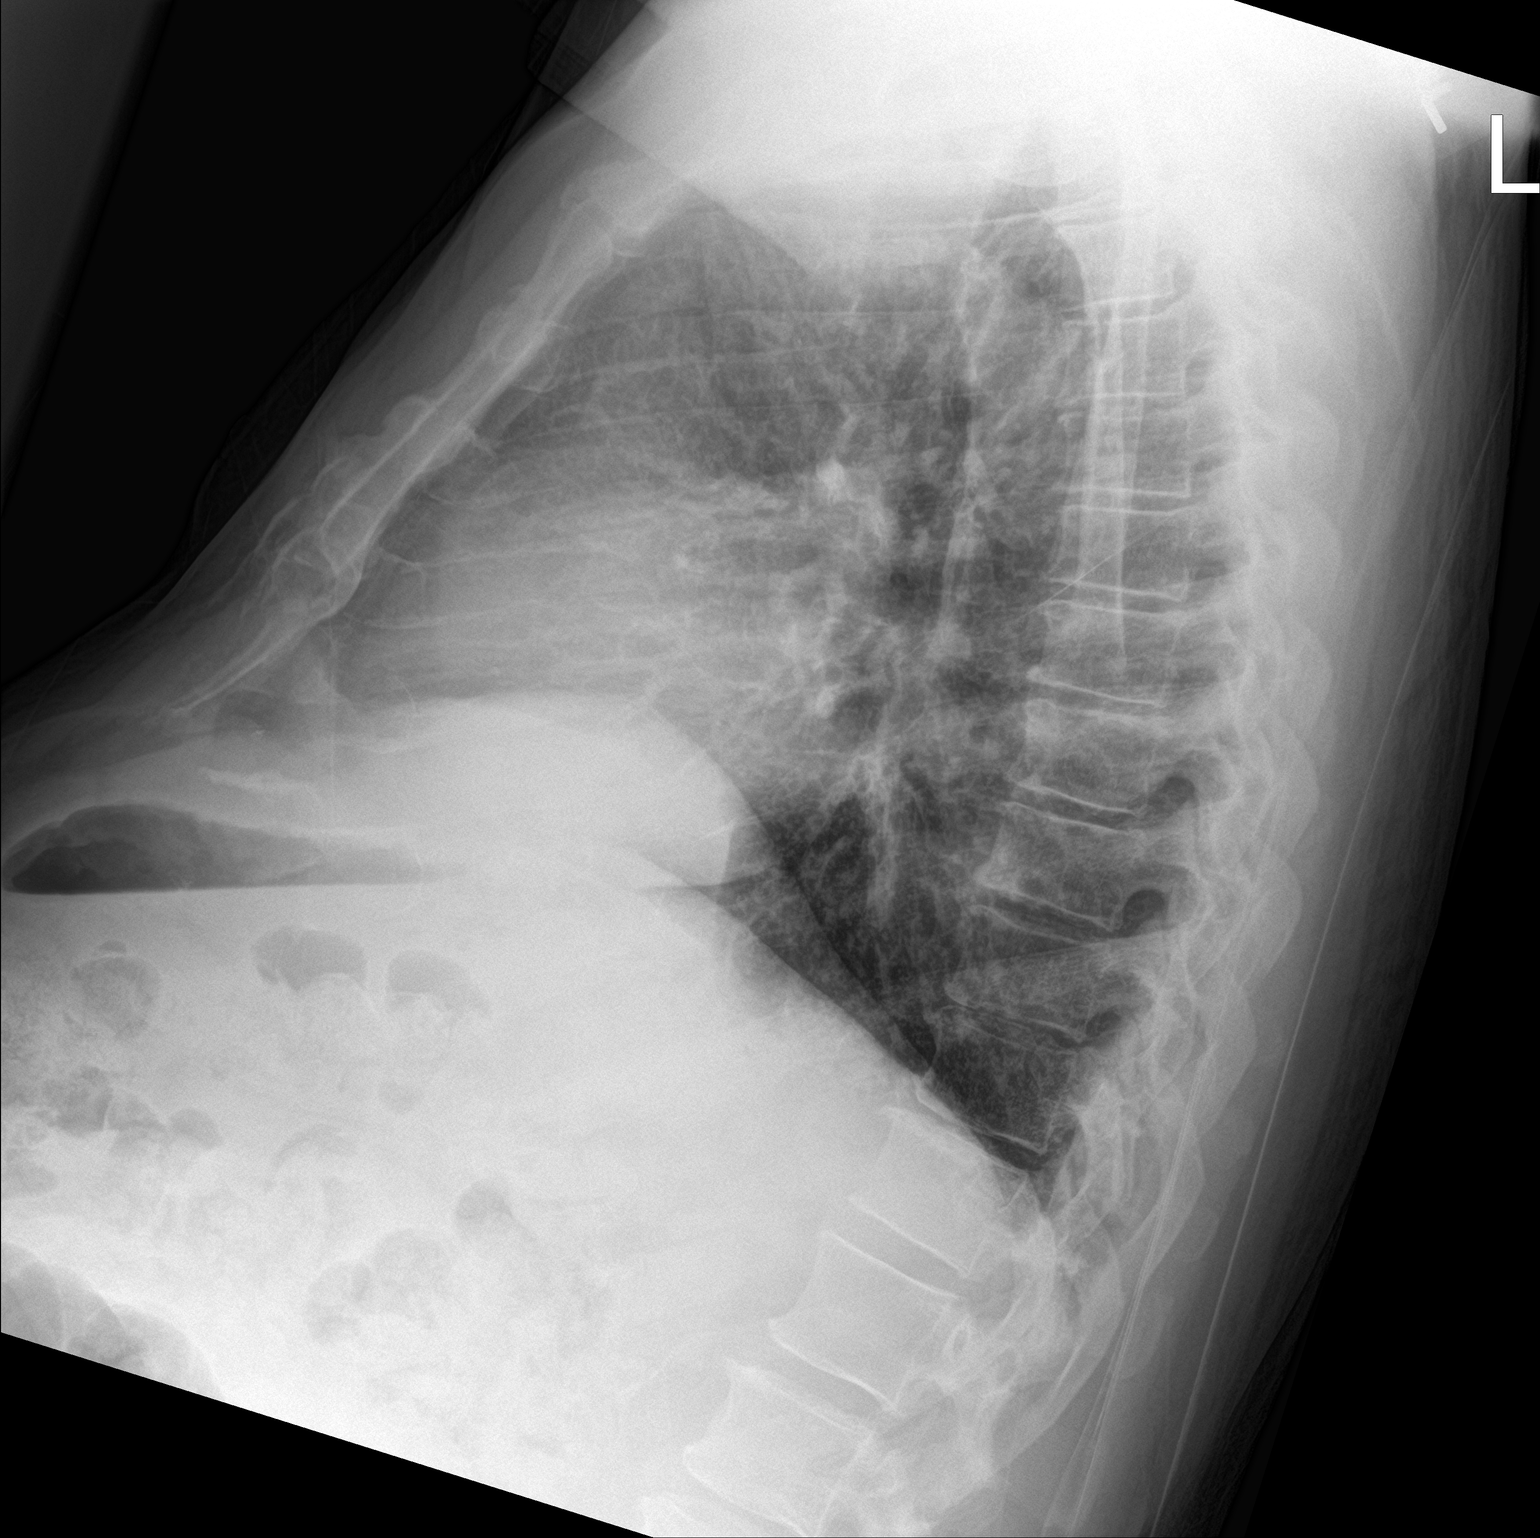

[chest ap]
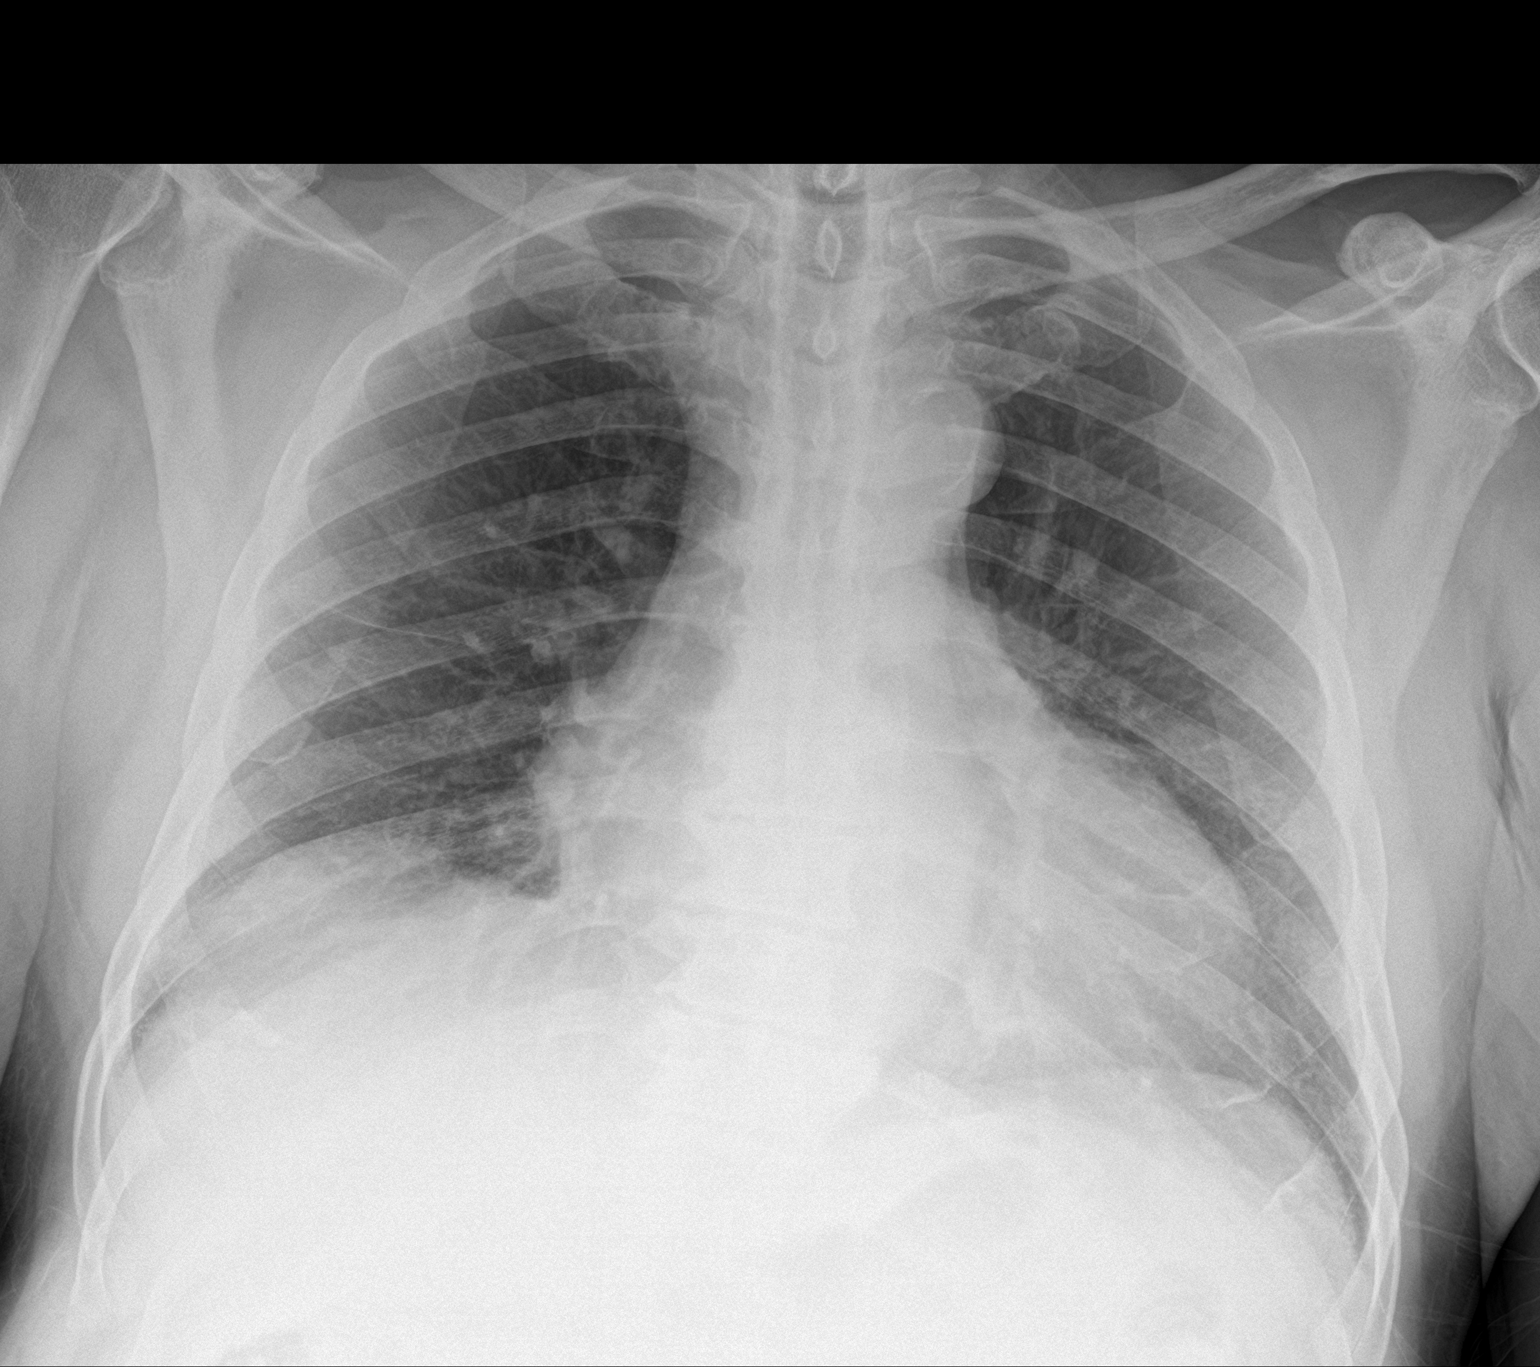

[2 of 2 positions shown; findings below may reference images not displayed]

FINDINGS: Atherosclerotic calcification of the aortic arch. Low lung volumes
are present, causing crowding of the pulmonary vasculature. Mild
enlargement of the cardiopericardial silhouette, without edema. No
pleural effusion. The lungs appear clear. Thoracic spondylosis
noted.
IMPRESSION: 1. Mild enlargement of the cardiopericardial silhouette, without
edema.
2.  Aortic Atherosclerosis ([id number]).
3. Low lung volumes.

## 2019-03-28 ENCOUNTER — Telehealth: Payer: Self-pay | Admitting: *Deleted

## 2019-03-28 ENCOUNTER — Encounter: Payer: Self-pay | Admitting: *Deleted

## 2019-03-28 DIAGNOSIS — Z20822 Contact with and (suspected) exposure to covid-19: Secondary | ICD-10-CM

## 2019-03-28 DIAGNOSIS — J40 Bronchitis, not specified as acute or chronic: Secondary | ICD-10-CM | POA: Diagnosis not present

## 2019-03-28 NOTE — Telephone Encounter (Signed)
Patient scheduled for testing on 03/29/19

## 2019-03-28 NOTE — Telephone Encounter (Signed)
Pt scheduled for covid testing at [facility] site.  Requested by Dr. Ronni Rumble.  Pt with severe cough.  Testing process reviewed with pt; drive through, wear mask as well as any other passengers in vehicle. Locations address given.  Pt verbalizes understanding.

## 2019-03-28 NOTE — Telephone Encounter (Signed)
This encounter was created in error - please disregard.

## 2019-03-28 NOTE — Telephone Encounter (Signed)
Copied from [place] [phone]. Topic: Quick Communication - See Telephone Encounter >> Mar 28, 2019 11:38 AM Loma [place] wrote: CRM for notification. See Telephone encounter for: 03/28/19.Alphonzo Dublin with Dr Lorrin Goodell called wanting pt to have a COVID test. She could not continue to hold with NT and asked if someone could just call her back concerning request.  Call her at [phone]

## 2019-03-29 ENCOUNTER — Other Ambulatory Visit: Payer: Medicare Other

## 2019-03-29 DIAGNOSIS — R6889 Other general symptoms and signs: Secondary | ICD-10-CM | POA: Diagnosis not present

## 2019-03-29 DIAGNOSIS — Z20822 Contact with and (suspected) exposure to covid-19: Secondary | ICD-10-CM

## 2019-04-01 LAB — NOVEL CORONAVIRUS, NAA: SARS-CoV-2, NAA: NOT DETECTED

## 2019-04-05 ENCOUNTER — Other Ambulatory Visit: Payer: Self-pay

## 2019-04-05 NOTE — Patient Outreach (Signed)
[place] [facility]) Care Management  04/05/2019  Steve [name] July 24, 1936 [id number]   Medication Adherence call to [name] spoke with patient he is past due on Losartan 100 mg and Pravastatin 40 mg patient explain he is taking 1 tablet daily and has medication until he picks up his refills tomorrow. [name] is showing past due under [place].  [place] Management Direct Dial [phone]  Fax [phone] [email or web address]

## 2019-04-07 DIAGNOSIS — M1990 Unspecified osteoarthritis, unspecified site: Secondary | ICD-10-CM | POA: Diagnosis not present

## 2019-04-07 DIAGNOSIS — E1142 Type 2 diabetes mellitus with diabetic polyneuropathy: Secondary | ICD-10-CM | POA: Diagnosis not present

## 2019-04-07 DIAGNOSIS — M25561 Pain in right knee: Secondary | ICD-10-CM | POA: Diagnosis not present

## 2019-04-09 ENCOUNTER — Other Ambulatory Visit: Payer: Self-pay

## 2019-04-09 ENCOUNTER — Encounter (HOSPITAL_COMMUNITY): Payer: Self-pay | Admitting: Emergency Medicine

## 2019-04-09 ENCOUNTER — Emergency Department (HOSPITAL_COMMUNITY)
Admission: EM | Admit: 2019-04-09 | Discharge: 2019-04-09 | Disposition: A | Discharge status: Home / Self Care | Payer: Medicare Other | Attending: Emergency Medicine | Admitting: Emergency Medicine

## 2019-04-09 DIAGNOSIS — Z87891 Personal history of nicotine dependence: Secondary | ICD-10-CM | POA: Diagnosis not present

## 2019-04-09 DIAGNOSIS — Z8546 Personal history of malignant neoplasm of prostate: Secondary | ICD-10-CM | POA: Diagnosis not present

## 2019-04-09 DIAGNOSIS — R21 Rash and other nonspecific skin eruption: Secondary | ICD-10-CM | POA: Insufficient documentation

## 2019-04-09 DIAGNOSIS — I129 Hypertensive chronic kidney disease with stage 1 through stage 4 chronic kidney disease, or unspecified chronic kidney disease: Secondary | ICD-10-CM | POA: Insufficient documentation

## 2019-04-09 DIAGNOSIS — E119 Type 2 diabetes mellitus without complications: Secondary | ICD-10-CM | POA: Insufficient documentation

## 2019-04-09 DIAGNOSIS — N184 Chronic kidney disease, stage 4 (severe): Secondary | ICD-10-CM | POA: Insufficient documentation

## 2019-04-09 DIAGNOSIS — Z7984 Long term (current) use of oral hypoglycemic drugs: Secondary | ICD-10-CM | POA: Diagnosis not present

## 2019-04-09 DIAGNOSIS — Z79899 Other long term (current) drug therapy: Secondary | ICD-10-CM | POA: Diagnosis not present

## 2019-04-09 MED ORDER — VALACYCLOVIR HCL 1 G PO TABS: 1000.0000 mg | ORAL_TABLET | Freq: Three times a day (TID) | ORAL | 0 refills | Status: DC

## 2019-04-09 MED ORDER — VALACYCLOVIR HCL 500 MG PO TABS
1000.0000 mg | ORAL_TABLET | Freq: Once | ORAL | Status: AC
  Administered 2019-04-09: 1000 mg via ORAL
  Filled 2019-04-09: qty 2

## 2019-04-09 NOTE — Discharge Instructions (Addendum)
Follow-up with your doctor next week for recheck 

## 2019-04-09 NOTE — ED Triage Notes (Signed)
Patient c/o rash to hips and groin bilaterally. Per patient appeared 4 days ago and is progressively getting worse. Denies any itching, pain, or drainage. Denies working outside recently. Per patient started a new medication recently Tramadol 2 days ago (rash started prior to medication).

## 2019-04-09 NOTE — ED Provider Notes (Signed)
[facility] EMERGENCY DEPARTMENT Provider Note   CSN: [id number] Arrival date & time: 04/09/19  1743     History   Chief Complaint Chief Complaint  Patient presents with  . Rash    HPI Steve [name] is a 83 y.o. male.     Patient complains of a rash to his leg and right flank for couple days.  He has minimal discomfort  The history is provided by the patient. No language interpreter was used.  Rash Location: Right leg and flank. Onset quality:  Sudden Timing:  Constant Progression:  Worsening Chronicity:  New Context: not animal contact   Relieved by:  Nothing Associated symptoms: no abdominal pain, no diarrhea, no fatigue and no headaches     Past Medical History:  Diagnosis Date  . Arthritis   . CKD (chronic kidney disease) stage 4, GFR 15-29 ml/min (HCC)   . Essential hypertension   . Hyperlipidemia   . Prostate cancer ([place])   . Renal insufficiency   . Type 2 diabetes mellitus [facility])     Patient Active Problem List   Diagnosis Date Noted  . Fall at home, initial encounter 07/26/2018  . Acute left-sided weakness 07/25/2018  . Hyperlipidemia 07/25/2018  . CKD (chronic kidney disease), stage IV ([place]) 05/13/2018  . Chronic kidney disease, stage 4 (severe) ([place]) 05/13/2018  . Chest pain 05/12/2018  . Elevated troponin 07/02/2017  . DM (diabetes mellitus), type 2 with renal complications ([place]) 94/49/6759  . IBS (irritable bowel syndrome) 01/28/2017  . Prostate cancer ([place]) 07/10/2014  . Abnormal ECG 04/12/2013  . HTN (hypertension) 04/12/2013  . SOB (shortness of breath) 04/12/2013  . Type 2 diabetes mellitus with diabetic nephropathy ([place]) 01/11/2013  . CHEST PAIN-UNSPECIFIED 06/12/2009    Past Surgical History:  Procedure Laterality Date  . CATARACT EXTRACTION W/PHACO Right 04/28/2016   Procedure: CATARACT EXTRACTION PHACO AND INTRAOCULAR LENS PLACEMENT (IOC);  Surgeon: Williams Che, MD;  Location: [facility];  Service: Ophthalmology;  Laterality:  Right;  CDE: 4.28  . COLONOSCOPY     about 2 years in [place]   . CYSTECTOMY          Home Medications    Prior to Admission medications   Medication Sig Start Date End Date Taking? Authorizing Provider  albuterol (PROVENTIL HFA;VENTOLIN HFA) 108 (90 Base) MCG/ACT inhaler Inhale 1-2 puffs into the lungs every 6 (six) hours as needed for wheezing or shortness of breath.    [provider]  Ascorbic Acid (VITAMIN C) 1000 MG tablet Take by mouth.    [provider]  aspirin 81 MG tablet Take 81 mg by mouth daily.    [provider]  aspirin-acetaminophen-caffeine (EXCEDRIN MIGRAINE) [phone] MG tablet Take 2 tablets by mouth every 6 (six) hours as needed for headache.    [provider]  Cholecalciferol (VITAMIN D-3) 1000 UNITS CAPS Take 1 capsule by mouth once a week.     [provider]  docusate sodium (COLACE) 100 MG capsule Take 100 mg by mouth daily.     [provider]  fluticasone Asencion Islam) 50 MCG/ACT nasal spray  01/14/19   [provider]  furosemide (LASIX) 20 MG tablet Take 1 tablet (20 mg total) by mouth daily for 5 days. 11/14/18 11/25/18  Noemi Chapel, MD  gabapentin (NEURONTIN) 300 MG capsule Take 1 capsule by mouth 2 (two) times daily. 01/19/15   [provider]  hydrALAZINE (APRESOLINE) 25 MG tablet Take 2 tablets (50 mg total) by mouth 3 (  three) times daily. 05/13/18 11/14/18  Kathie Dike, MD  hydrALAZINE (APRESOLINE) 50 MG tablet  11/17/18   [provider]  linagliptin (TRADJENTA) 5 MG TABS tablet Take 5 mg by mouth daily.    [provider]  losartan (COZAAR) 100 MG tablet  01/14/19   [provider]  Lysine 500 MG CAPS Take 500 mg by mouth daily.    [provider]  metoprolol tartrate (LOPRESSOR) 25 MG tablet Take 0.5 tablets (12.5 mg total) by mouth 2 (two) times daily. 06/15/18   Strader, Fransisco Hertz, PA-C  NIFEdipine (PROCARDIA XL/ADALAT-CC) 90 MG 24 hr tablet TAKE (1)  TABLET BY MOUTH ONCE DAILY. 06/17/18   Herminio Commons, MD  omeprazole (PRILOSEC) 20 MG capsule Take 1 capsule by mouth 2 (two) times daily. 04/06/13   [provider]  pravastatin (PRAVACHOL) 40 MG tablet Take 1 tablet by mouth every evening.  04/06/13   [provider]  tamsulosin (FLOMAX) 0.4 MG CAPS Take 1 capsule by mouth daily.  04/06/13   [provider]  valACYclovir (VALTREX) 1000 MG tablet Take 1 tablet (1,000 mg total) by mouth 3 (three) times daily. 04/09/19   Milton Ferguson, MD    Family History Family History  Problem Relation Age of Onset  . Heart disease Mother        cause of death  . Diabetes Sister   . Colon cancer Neg Hx     Social History Social History   Tobacco Use  . Smoking status: Former Smoker    Packs/day: 2.00    Years: 25.00    Pack years: 50.00    Types: Pipe, Cigars    Start date: 11/08/1954    Quit date: 10/14/1987    Years since quitting: 31.5  . Smokeless tobacco: Never Used  . Tobacco comment: About 2 pipes a day  Substance Use Topics  . Alcohol use: No    Alcohol/week: 0.0 standard drinks  . Drug use: No     Allergies   Patient has no known allergies.   Review of Systems Review of Systems  Constitutional: Negative for appetite change and fatigue.  HENT: Negative for congestion, ear discharge and sinus pressure.   Eyes: Negative for discharge.  Respiratory: Negative for cough.   Cardiovascular: Negative for chest pain.  Gastrointestinal: Negative for abdominal pain and diarrhea.  Genitourinary: Negative for frequency and hematuria.  Musculoskeletal: Negative for back pain.  Skin: Positive for rash.  Neurological: Negative for seizures and headaches.  Psychiatric/Behavioral: Negative for hallucinations.     Physical Exam Updated Vital Signs BP (!) 179/72 (BP Location: Right Arm)   Pulse 96   Temp 98.4 F (36.9 C) (Oral)   Resp 18   Ht 5\' 5"  (1.651 m)   Wt 79.4 kg   SpO2 99%   BMI 29.12 kg/m    Physical Exam Vitals signs and nursing note reviewed.  Constitutional:      Appearance: He is well-developed.  HENT:     Head: Normocephalic.     Nose: Nose normal.  Eyes:     General: No scleral icterus.    Conjunctiva/sclera: Conjunctivae normal.  Neck:     Musculoskeletal: Neck supple.     Thyroid: No thyromegaly.  Cardiovascular:     Rate and Rhythm: Normal rate and regular rhythm.     Heart sounds: No murmur. No friction rub. No gallop.   Pulmonary:     Breath sounds: No stridor. No wheezing or rales.  Chest:  Chest wall: No tenderness.  Abdominal:     General: There is no distension.     Tenderness: There is no abdominal tenderness. There is no rebound.  Musculoskeletal: Normal range of motion.  Lymphadenopathy:     Cervical: No cervical adenopathy.  Skin:    Findings: Erythema and rash present.     Comments: Rash to leg on right side and back with vesicles consistent with shingles  Neurological:     Mental Status: He is oriented to person, place, and time.     Motor: No abnormal muscle tone.     Coordination: Coordination normal.  Psychiatric:        Behavior: Behavior normal.      ED Treatments / Results  Labs (all labs ordered are listed, but only abnormal results are displayed) Labs Reviewed - No data to display  EKG    Radiology No results found.  Procedures Procedures (including critical care time)  Medications Ordered in ED Medications - No data to display   Initial Impression / Assessment and Plan / ED Course  I have reviewed the triage vital signs and the nursing notes.  Pertinent labs & imaging results that were available during my care of the patient were reviewed by me and considered in my medical decision making (see chart for details).        Patient with shingles.  He will be placed on Valtrex and follow-up with his PCP  Final Clinical Impressions(s) / ED Diagnoses   Final diagnoses:  Rash    ED Discharge Orders          Ordered    valACYclovir (VALTREX) 1000 MG tablet  3 times daily     04/09/19 1833           Milton Ferguson, MD 04/09/19 1836

## 2019-04-12 ENCOUNTER — Ambulatory Visit: Payer: Medicare Other | Admitting: Orthopaedic Surgery

## 2019-04-21 ENCOUNTER — Other Ambulatory Visit: Payer: Self-pay | Admitting: *Deleted

## 2019-04-21 MED ORDER — METOPROLOL TARTRATE 25 MG PO TABS: 12.5000 mg | ORAL_TABLET | Freq: Two times a day (BID) | ORAL | 1 refills | Status: DC

## 2019-04-27 ENCOUNTER — Other Ambulatory Visit: Payer: Self-pay

## 2019-04-27 NOTE — Patient Outreach (Signed)
[place] [facility]) Care Management  04/27/2019  Maxmilian Trostel April 06, 1936 [id number]   Medication Adherence call to Mr. Farrel Guimond [place] Identifiers Verify spoke with patient he is past due on Losartan 100 mg patient explain he is no longer taking this medication doctor took him off last time he had a doctor visit. Mr. Wolters is showing past due under [place].   [place] Management Direct Dial [phone]  Fax [phone] [email or web address]

## 2019-05-03 DIAGNOSIS — J40 Bronchitis, not specified as acute or chronic: Secondary | ICD-10-CM | POA: Diagnosis not present

## 2019-05-03 DIAGNOSIS — E1142 Type 2 diabetes mellitus with diabetic polyneuropathy: Secondary | ICD-10-CM | POA: Diagnosis not present

## 2019-05-09 DIAGNOSIS — E1122 Type 2 diabetes mellitus with diabetic chronic kidney disease: Secondary | ICD-10-CM | POA: Diagnosis not present

## 2019-05-09 DIAGNOSIS — R109 Unspecified abdominal pain: Secondary | ICD-10-CM | POA: Diagnosis not present

## 2019-05-09 DIAGNOSIS — E1140 Type 2 diabetes mellitus with diabetic neuropathy, unspecified: Secondary | ICD-10-CM | POA: Diagnosis not present

## 2019-05-09 DIAGNOSIS — Z87891 Personal history of nicotine dependence: Secondary | ICD-10-CM | POA: Diagnosis not present

## 2019-05-09 DIAGNOSIS — Z7982 Long term (current) use of aspirin: Secondary | ICD-10-CM | POA: Diagnosis not present

## 2019-05-09 DIAGNOSIS — Z79899 Other long term (current) drug therapy: Secondary | ICD-10-CM | POA: Diagnosis not present

## 2019-05-09 DIAGNOSIS — I129 Hypertensive chronic kidney disease with stage 1 through stage 4 chronic kidney disease, or unspecified chronic kidney disease: Secondary | ICD-10-CM | POA: Diagnosis not present

## 2019-05-09 DIAGNOSIS — I10 Essential (primary) hypertension: Secondary | ICD-10-CM | POA: Diagnosis not present

## 2019-05-09 DIAGNOSIS — N189 Chronic kidney disease, unspecified: Secondary | ICD-10-CM | POA: Diagnosis not present

## 2019-05-12 ENCOUNTER — Other Ambulatory Visit: Payer: Self-pay

## 2019-05-12 NOTE — Patient Outreach (Signed)
[name]) Care Management  05/12/2019  [name] January 06, 1936 [id number]   TELEPHONE SCREENING Referral date: 05/11/19 Referral source: utilization management referral Referral reason: medication assistance Insurance: [employer]  Telephone call to patient regarding utilization management referral. HIPAA verified with patient. RNCM introduced herself and explained reason for call. Patient states he is having trouble affording his tradjenta copayment.  RNCM discussed and offered [facility] care management services. Patient verbally agreed to services with [facility] pharmacist for medication assistance.  Patient reports his most recent A1c was 7 and his fasting blood sugar today was 79.  Patient states his blood sugars are within normal range and he feels he is managing his diabetes without issue.   RNCM offered to mail patient [facility] care management brochure for future reference. Patient verbally agreed.   PLAN: RNCM will refer patient to [facility] pharmacist for medication assistance  RNCM will mail patient [facility] care management brochure as discussed.   [name] RN,BSN,CCM [facility] Telephonic  [phone]

## 2019-05-18 ENCOUNTER — Encounter: Payer: Self-pay | Admitting: Orthopaedic Surgery

## 2019-05-18 ENCOUNTER — Ambulatory Visit (INDEPENDENT_AMBULATORY_CARE_PROVIDER_SITE_OTHER): Payer: Medicare Other | Admitting: Orthopaedic Surgery

## 2019-05-18 ENCOUNTER — Other Ambulatory Visit: Payer: Self-pay

## 2019-05-18 DIAGNOSIS — M25561 Pain in right knee: Secondary | ICD-10-CM

## 2019-05-18 DIAGNOSIS — B029 Zoster without complications: Secondary | ICD-10-CM

## 2019-05-18 DIAGNOSIS — E1042 Type 1 diabetes mellitus with diabetic polyneuropathy: Secondary | ICD-10-CM | POA: Diagnosis not present

## 2019-05-18 DIAGNOSIS — M21372 Foot drop, left foot: Secondary | ICD-10-CM | POA: Diagnosis not present

## 2019-05-18 DIAGNOSIS — G8929 Other chronic pain: Secondary | ICD-10-CM

## 2019-05-18 NOTE — Progress Notes (Signed)
Virtual Visit via Telephone Note  I connected with@ on 05/18/19 at 10:40 AM EDT by telephone and verified that I am speaking with the correct person using two identifiers.  Location: Patient: home Provider: office   I discussed the limitations, risks, security and privacy concerns of performing an evaluation and management service by telephone and the availability of in person appointments. I also discussed with the patient that there may be a patient responsible charge related to this service. The patient expressed understanding and agreed to proceed.   History of Present Illness: He has more pain in the right knee.  He does not like his AFO on the left.  He has had shingles over the last few weeks and is not allowed out of the house yet.  He has no new trauma.  He says he needs to be seen for the right knee as it swells and pops.   Observations/Objective: Per above.  Assessment and Plan: Encounter Diagnoses  Name Primary?  . Chronic pain of right knee Yes  . Diabetic peripheral neuropathy associated with type 1 diabetes mellitus ([place])   . Left foot drop   . Herpes zoster without complication      Follow Up Instructions: One month in office assuming shingles has resolved.   I discussed the assessment and treatment plan with the patient. The patient was provided an opportunity to ask questions and all were answered. The patient agreed with the plan and demonstrated an understanding of the instructions.  6The patient was advised to call back or seek an in-person evaluation if the symptoms worsen or if the condition fails to improve as anticipated.  I provided 6 minutes of non-face-to-face time during this encounter.   Sanjuana Kava, MD

## 2019-05-23 ENCOUNTER — Other Ambulatory Visit: Payer: Self-pay

## 2019-05-23 DIAGNOSIS — E1142 Type 2 diabetes mellitus with diabetic polyneuropathy: Secondary | ICD-10-CM | POA: Diagnosis not present

## 2019-05-23 DIAGNOSIS — J42 Unspecified chronic bronchitis: Secondary | ICD-10-CM | POA: Diagnosis not present

## 2019-05-23 DIAGNOSIS — R0602 Shortness of breath: Secondary | ICD-10-CM | POA: Diagnosis not present

## 2019-05-23 DIAGNOSIS — Z20822 Contact with and (suspected) exposure to covid-19: Secondary | ICD-10-CM

## 2019-05-24 LAB — NOVEL CORONAVIRUS, NAA: SARS-CoV-2, NAA: NOT DETECTED

## 2019-05-31 DIAGNOSIS — N184 Chronic kidney disease, stage 4 (severe): Secondary | ICD-10-CM | POA: Diagnosis not present

## 2019-06-02 ENCOUNTER — Telehealth: Payer: Self-pay | Admitting: Cardiovascular Disease

## 2019-06-02 NOTE — Telephone Encounter (Signed)
Virtual Visit Pre-Appointment Phone Call  "(Name), I am calling you today to discuss your upcoming appointment. We are currently trying to limit exposure to the virus that causes COVID-19 by seeing patients at home rather than in the office."  1. "What is the BEST phone number to call the day of the visit?" - include this in appointment notes  2. Do you have or have access to (through a family member/friend) a smartphone with video capability that we can use for your visit?" a. If yes - list this number in appt notes as cell (if different from BEST phone #) and list the appointment type as a VIDEO visit in appointment notes b. If no - list the appointment type as a PHONE visit in appointment notes  3. Confirm consent - "In the setting of the current Covid19 crisis, you are scheduled for a (phone or video) visit with your provider on (date) at (time).  Just as we do with many in-office visits, in order for you to participate in this visit, we must obtain consent.  If you'd like, I can send this to your mychart (if signed up) or email for you to review.  Otherwise, I can obtain your verbal consent now.  All virtual visits are billed to your insurance company just like a normal visit would be.  By agreeing to a virtual visit, we'd like you to understand that the technology does not allow for your provider to perform an examination, and thus may limit your provider's ability to fully assess your condition. If your provider identifies any concerns that need to be evaluated in person, we will make arrangements to do so.  Finally, though the technology is pretty good, we cannot assure that it will always work on either your or our end, and in the setting of a video visit, we may have to convert it to a phone-only visit.  In either situation, we cannot ensure that we have a secure connection.  Are you willing to proceed?" STAFF: Did the patient verbally acknowledge consent to telehealth visit? Document  YES/NO here: yes  4. Advise patient to be prepared - "Two hours prior to your appointment, go ahead and check your blood pressure, pulse, oxygen saturation, and your weight (if you have the equipment to check those) and write them all down. When your visit starts, your provider will ask you for this information. If you have an Apple Watch or Kardia device, please plan to have heart rate information ready on the day of your appointment. Please have a pen and paper handy nearby the day of the visit as well."  5. Give patient instructions for MyChart download to smartphone OR [email or web address] as below if video visit (depending on what platform provider is using)  6. Inform patient they will receive a phone call 15 minutes prior to their appointment time (may be from unknown caller ID) so they should be prepared to answer    TELEPHONE CALL NOTE  Marrion Accomando has been deemed a candidate for a follow-up tele-health visit to limit community exposure during the Covid-19 pandemic. I spoke with the patient via phone to ensure availability of phone/video source, confirm preferred email & phone number, and discuss instructions and expectations.  I reminded Samer Klemp to be prepared with any vital sign and/or heart rhythm information that could potentially be obtained via home monitoring, at the time of his visit. I reminded Jarone Ostergaard to expect a phone call prior to his visit.  Bertram Gala Goins 06/02/2019 11:29 AM

## 2019-06-07 ENCOUNTER — Encounter: Payer: Self-pay | Admitting: Cardiovascular Disease

## 2019-06-07 ENCOUNTER — Telehealth (INDEPENDENT_AMBULATORY_CARE_PROVIDER_SITE_OTHER): Payer: Medicare Other | Admitting: Cardiovascular Disease

## 2019-06-07 ENCOUNTER — Other Ambulatory Visit: Payer: Self-pay

## 2019-06-07 VITALS — BP 121/44 | HR 80 | Ht 65.0 in | Wt 178.0 lb

## 2019-06-07 DIAGNOSIS — E785 Hyperlipidemia, unspecified: Secondary | ICD-10-CM

## 2019-06-07 DIAGNOSIS — I493 Ventricular premature depolarization: Secondary | ICD-10-CM

## 2019-06-07 DIAGNOSIS — N184 Chronic kidney disease, stage 4 (severe): Secondary | ICD-10-CM

## 2019-06-07 DIAGNOSIS — I10 Essential (primary) hypertension: Secondary | ICD-10-CM

## 2019-06-07 DIAGNOSIS — R0789 Other chest pain: Secondary | ICD-10-CM

## 2019-06-07 NOTE — Progress Notes (Signed)
Virtual Visit via Telephone Note   This visit type was conducted due to national recommendations for restrictions regarding the COVID-19 Pandemic (e.g. social distancing) in an effort to limit this patient's exposure and mitigate transmission in our community.  Due to his co-morbid illnesses, this patient is at least at moderate risk for complications without adequate follow up.  This format is felt to be most appropriate for this patient at this time.  The patient did not have access to video technology/had technical difficulties with video requiring transitioning to audio format only (telephone).  All issues noted in this document were discussed and addressed.  No physical exam could be performed with this format.  Please refer to the patient's chart for his  consent to telehealth for [facility].   Date:  06/07/2019   ID:  Steve [name], DOB [birth year], MRN [id number]  Patient Location: Home Provider Location: Office  PCP:  Rosita Fire, MD  Cardiologist:  Kate Sable, MD  Electrophysiologist:  None   Evaluation Performed:  Follow-Up Visit  Chief Complaint:  Atypical chest pain  History of Present Illness:    Steve [name] is a 83 y.o. male with a history of recurrent, atypical chest pain, PVCs, chronic kidney disease stage IV, hypertension, and hyperlipidemia.  He denies chest pain, shortness of breath, and palpitations at present.  The patient does not have symptoms concerning for COVID-19 infection (fever, chills, cough, or new shortness of breath).    Past Medical History:  Diagnosis Date  . Arthritis   . CKD (chronic kidney disease) stage 4, GFR 15-29 ml/min (HCC)   . Essential hypertension   . Hyperlipidemia   . Prostate cancer ([place])   . Renal insufficiency   . Type 2 diabetes mellitus ([place])    Past Surgical History:  Procedure Laterality Date  . CATARACT EXTRACTION W/PHACO Right 04/28/2016   Procedure: CATARACT EXTRACTION PHACO AND INTRAOCULAR LENS  PLACEMENT (IOC);  Surgeon: Williams Che, MD;  Location: [facility];  Service: Ophthalmology;  Laterality: Right;  CDE: 4.28  . COLONOSCOPY     about 2 years in [place]   . CYSTECTOMY       Current Meds  Medication Sig  . albuterol (PROVENTIL HFA;VENTOLIN HFA) 108 (90 Base) MCG/ACT inhaler Inhale 1-2 puffs into the lungs every 6 (six) hours as needed for wheezing or shortness of breath.  . Ascorbic Acid (VITAMIN C) 1000 MG tablet Take by mouth.  Marland Kitchen aspirin 81 MG tablet Take 81 mg by mouth daily.  . Cholecalciferol (VITAMIN D-3) 1000 UNITS CAPS Take 1 capsule by mouth once a week.   . docusate sodium (COLACE) 100 MG capsule Take 100 mg by mouth daily.   . fluticasone (FLONASE) 50 MCG/ACT nasal spray   . furosemide (LASIX) 20 MG tablet Take 1 tablet (20 mg total) by mouth daily for 5 days.  Marland Kitchen gabapentin (NEURONTIN) 300 MG capsule Take 1 capsule by mouth 2 (two) times daily.  . hydrALAZINE (APRESOLINE) 50 MG tablet Take 50 mg by mouth 3 (three) times daily.  Marland Kitchen linagliptin (TRADJENTA) 5 MG TABS tablet Take 5 mg by mouth daily.  Marland Kitchen Lysine 500 MG CAPS Take 500 mg by mouth daily.  . metoprolol tartrate (LOPRESSOR) 25 MG tablet Take 0.5 tablets (12.5 mg total) by mouth 2 (two) times daily.  Marland Kitchen NIFEdipine (PROCARDIA XL/ADALAT-CC) 90 MG 24 hr tablet TAKE (1) TABLET BY MOUTH ONCE DAILY.  Marland Kitchen omeprazole (PRILOSEC) 20 MG capsule Take 1 capsule by mouth 2 (two) times daily.  Marland Kitchen  pravastatin (PRAVACHOL) 40 MG tablet Take 1 tablet by mouth every evening.   . tamsulosin (FLOMAX) 0.4 MG CAPS Take 1 capsule by mouth daily.   . valACYclovir (VALTREX) 1000 MG tablet Take 1 tablet (1,000 mg total) by mouth 3 (three) times daily.  . [DISCONTINUED] aspirin-acetaminophen-caffeine (EXCEDRIN MIGRAINE) 250-250-65 MG tablet Take 2 tablets by mouth every 6 (six) hours as needed for headache.     Allergies:   Patient has no known allergies.   Social History   Tobacco Use  . Smoking status: Former Smoker    Packs/day: 2.00     Years: 25.00    Pack years: 50.00    Types: Pipe, Cigars    Start date: 11/08/1954    Quit date: 10/14/1987    Years since quitting: 31.6  . Smokeless tobacco: Never Used  . Tobacco comment: About 2 pipes a day  Substance Use Topics  . Alcohol use: No    Alcohol/week: 0.0 standard drinks  . Drug use: No     Family Hx: The patient's family history includes Diabetes in his sister; Heart disease in his mother. There is no history of Colon cancer.  ROS:   Please see the history of present illness.     All other systems reviewed and are negative.   Prior CV studies:   The following studies were reviewed today:  Echocardiogram: 05/13/2018 Study Conclusions  - Left ventricle: The cavity size was normal. Wall thickness was increased in a pattern of moderate LVH. Systolic function was vigorous. The estimated ejection fraction was in the range of 65% to 70%. Wall motion was normal; there were no regional wall motion abnormalities. Doppler parameters are consistent with abnormal left ventricular relaxation (grade 1 diastolic dysfunction). - Aortic valve: Mildly to moderately calcified annulus. Trileaflet; moderately thickened leaflets. Valve area (VTI): 2.89 cm^2. Valve area (Vmax): 2.66 cm^2. - Mitral valve: Mildly calcified annulus. Mildly thickened leaflets - Atrial septum: No defect or patent foramen ovale was identified. - Pulmonary arteries: Systolic pressure was moderately increased. PA peak pressure: 56 mm Hg (S). - Technically adequate study.  NST: 05/13/2018  There was no ST segment deviation noted during stress.  The study is normal. There are no perfusion defects  This is a low risk study.  The left ventricular ejection fraction is hyperdynamic (>65%).  Labs/Other Tests and Data Reviewed:    EKG:  No ECG reviewed.  Recent Labs: 11/14/2018: ALT 26; B Natriuretic Peptide 138.0 12/01/2018: BUN 41; Creatinine, Ser 3.78; Hemoglobin 11.7; Platelets  253; Potassium 4.6; Sodium 138   Recent Lipid Panel Lab Results  Component Value Date/Time   CHOL 149 07/25/2018 07:15 AM   TRIG 71 07/25/2018 07:15 AM   HDL 46 07/25/2018 07:15 AM   CHOLHDL 3.2 07/25/2018 07:15 AM   LDLCALC 89 07/25/2018 07:15 AM    Wt Readings from Last 3 Encounters:  06/07/19 178 lb (80.7 kg)  04/09/19 175 lb (79.4 kg)  02/02/19 180 lb (81.6 kg)     Objective:    Vital Signs:  BP (!) 121/44   Pulse 80   Ht 5\' 5"  (1.651 m)   Wt 178 lb (80.7 kg)   BMI 29.62 kg/m    VITAL SIGNS:  reviewed  ASSESSMENT & PLAN:    1.  Atypical chest pain: Low risk nuclear stress test in 2019.  Vigorous left ventricular systolic function by echocardiogram in 2019.  No further cardiac testing is indicated at this time.  2.  PVCs/palpitations: Symptomatically stable on  Lopressor 12.5 mg twice daily. Can be refilled by PCP going forward.  3.  Hypertension: No change to therapy.  4. Hyperlipidemia: Continue pravastatin 40 mg.  5. Chronic kidney disease stage IV:  Followed by nephrology.    COVID-19 Education: The signs and symptoms of COVID-19 were discussed with the patient and how to seek care for testing (follow up with PCP or arrange E-visit).  The importance of social distancing was discussed today.  Time:   Today, I have spent 5 minutes with the patient with telehealth technology discussing the above problems.     Medication Adjustments/Labs and Tests Ordered: Current medicines are reviewed at length with the patient today.  Concerns regarding medicines are outlined above.   Tests Ordered: No orders of the defined types were placed in this encounter.   Medication Changes: No orders of the defined types were placed in this encounter.   Follow Up:  Virtual Visit or In Person prn  Signed, Kate Sable, MD  06/07/2019 8:44 AM    [place]

## 2019-06-07 NOTE — Patient Instructions (Signed)
Medication Instructions:  Your physician recommends that you continue on your current medications as directed. Please refer to the Current Medication list given to you today.   Labwork: NONE  Testing/Procedures: NONE  Follow-Up: Your physician recommends that you schedule a follow-up appointment in: AS NEEDED      Any Other Special Instructions Will Be Listed Below (If Applicable).     If you need a refill on your cardiac medications before your next appointment, please call your pharmacy.   

## 2019-06-15 ENCOUNTER — Ambulatory Visit (INDEPENDENT_AMBULATORY_CARE_PROVIDER_SITE_OTHER): Payer: Medicare Other | Admitting: Orthopaedic Surgery

## 2019-06-15 ENCOUNTER — Other Ambulatory Visit: Payer: Self-pay

## 2019-06-15 ENCOUNTER — Encounter: Payer: Self-pay | Admitting: Orthopaedic Surgery

## 2019-06-15 VITALS — Ht 65.0 in | Wt 175.0 lb

## 2019-06-15 DIAGNOSIS — M25561 Pain in right knee: Secondary | ICD-10-CM

## 2019-06-15 DIAGNOSIS — G8929 Other chronic pain: Secondary | ICD-10-CM

## 2019-06-15 DIAGNOSIS — E1042 Type 1 diabetes mellitus with diabetic polyneuropathy: Secondary | ICD-10-CM

## 2019-06-15 DIAGNOSIS — M21372 Foot drop, left foot: Secondary | ICD-10-CM

## 2019-06-15 NOTE — Progress Notes (Signed)
Patient ID:Steve [name], male DOB:1936-06-05, 83 y.o. QHU:[id number]  Chief Complaint  Patient presents with  . Knee Pain    R/It not any better    HPI  Steve [name] is a 83 y.o. male who has left foot drop and right knee pain.  He is wearing his AFO.  He does not like it but it helps.  It has helped.  He has no skin problems with it.  His right knee continues to hurt and swell and pop but does not give way.  He has no new trauma, no redness.  He uses a cane.   Body mass index is 29.12 kg/m.  ROS  Review of Systems  All other systems reviewed and are negative.  The following is a summary of the past history medically, past history surgically, known current medicines, social history and family history.  This information is gathered electronically by the computer from prior information and documentation.  I review this each visit and have found including this information at this point in the chart is beneficial and informative.    Past Medical History:  Diagnosis Date  . Arthritis   . CKD (chronic kidney disease) stage 4, GFR 15-29 ml/min (HCC)   . Essential hypertension   . Hyperlipidemia   . Prostate cancer ([place])   . Renal insufficiency   . Type 2 diabetes mellitus ([place])     Past Surgical History:  Procedure Laterality Date  . CATARACT EXTRACTION W/PHACO Right 04/28/2016   Procedure: CATARACT EXTRACTION PHACO AND INTRAOCULAR LENS PLACEMENT (IOC);  Surgeon: Williams Che, MD;  Location: [facility];  Service: Ophthalmology;  Laterality: Right;  CDE: 4.28  . COLONOSCOPY     about 2 years in [place]   . CYSTECTOMY      Family History  Problem Relation Age of Onset  . Heart disease Mother        cause of death  . Diabetes Sister   . Colon cancer Neg Hx     Social History Social History   Tobacco Use  . Smoking status: Former Smoker    Packs/day: 2.00    Years: 25.00    Pack years: 50.00    Types: Pipe, Cigars    Start date: 11/08/1954    Quit date: 10/14/1987   Years since quitting: 31.6  . Smokeless tobacco: Never Used  . Tobacco comment: About 2 pipes a day  Substance Use Topics  . Alcohol use: No    Alcohol/week: 0.0 standard drinks  . Drug use: No    No Known Allergies  Current Outpatient Medications  Medication Sig Dispense Refill  . albuterol (PROVENTIL HFA;VENTOLIN HFA) 108 (90 Base) MCG/ACT inhaler Inhale 1-2 puffs into the lungs every 6 (six) hours as needed for wheezing or shortness of breath.    . Ascorbic Acid (VITAMIN C) 1000 MG tablet Take by mouth.    Marland Kitchen aspirin 81 MG tablet Take 81 mg by mouth daily.    . Cholecalciferol (VITAMIN D-3) 1000 UNITS CAPS Take 1 capsule by mouth once a week.     . docusate sodium (COLACE) 100 MG capsule Take 100 mg by mouth daily.     . fluticasone (FLONASE) 50 MCG/ACT nasal spray     . furosemide (LASIX) 20 MG tablet Take 1 tablet (20 mg total) by mouth daily for 5 days. 5 tablet 0  . gabapentin (NEURONTIN) 300 MG capsule Take 1 capsule by mouth 2 (two) times daily.    . hydrALAZINE (APRESOLINE) 50 MG  tablet Take 50 mg by mouth 3 (three) times daily.    Marland Kitchen linagliptin (TRADJENTA) 5 MG TABS tablet Take 5 mg by mouth daily.    Marland Kitchen Lysine 500 MG CAPS Take 500 mg by mouth daily.    . metoprolol tartrate (LOPRESSOR) 25 MG tablet Take 0.5 tablets (12.5 mg total) by mouth 2 (two) times daily. 90 tablet 1  . NIFEdipine (PROCARDIA XL/ADALAT-CC) 90 MG 24 hr tablet TAKE (1) TABLET BY MOUTH ONCE DAILY. 30 tablet 6  . omeprazole (PRILOSEC) 20 MG capsule Take 1 capsule by mouth 2 (two) times daily.    . pravastatin (PRAVACHOL) 40 MG tablet Take 1 tablet by mouth every evening.     . tamsulosin (FLOMAX) 0.4 MG CAPS Take 1 capsule by mouth daily.     . valACYclovir (VALTREX) 1000 MG tablet Take 1 tablet (1,000 mg total) by mouth 3 (three) times daily. 21 tablet 0   No current facility-administered medications for this visit.      Physical Exam  Height 5\' 5"  (1.651 m), weight 175 lb (79.4 kg).  Constitutional:  overall normal hygiene, normal nutrition, well developed, normal grooming, normal body habitus. Assistive device:crutches  Musculoskeletal: gait and station Limp right, muscle tone and strength are normal, no tremors or atrophy is present.  .  Neurological: coordination overall normal.  Deep tendon reflex/nerve stretch intact.  Sensation normal.  Cranial nerves II-XII intact.   Skin:   Normal overall no scars, lesions, ulcers or rashes. No psoriasis.  Psychiatric: Alert and oriented x 3.  Recent memory intact, remote memory unclear.  Normal mood and affect. Well groomed.  Good eye contact.  Cardiovascular: overall no swelling, no varicosities, no edema bilaterally, normal temperatures of the legs and arms, no clubbing, cyanosis and good capillary refill.  Left lower leg AFO fits well.  He walks better with no dragging of the left foot now.  His skin is OK.  Right knee has effusion, ROM 0 to 100, crepitus, NV intact.  The knee is stable.  Lymphatic: palpation is normal.  All other systems reviewed and are negative   The patient has been educated about the nature of the problem(s) and counseled on treatment options.  The patient appeared to understand what I have discussed and is in agreement with it.  Encounter Diagnoses  Name Primary?  . Chronic pain of right knee Yes  . Diabetic peripheral neuropathy associated with type 1 diabetes mellitus ([place])   . Left foot drop    PROCEDURE NOTE:  The patient requests injections of the right knee , verbal consent was obtained.  The right knee was prepped appropriately after time out was performed.   Sterile technique was observed and injection of 1 cc of Depo-Medrol 40 mg with several cc's of plain xylocaine. Anesthesia was provided by ethyl chloride and a 20-gauge needle was used to inject the knee area. The injection was tolerated well.  A band aid dressing was applied.  The patient was advised to apply ice later today and tomorrow to the  injection sight as needed.   PLAN Call if any problems.  Precautions discussed.  Continue current medications.   Return to clinic 2 months   Electronically Signed Sanjuana Kava, MD 9/2/20209:17 AM

## 2019-06-16 ENCOUNTER — Other Ambulatory Visit: Payer: Self-pay

## 2019-06-16 MED ORDER — METOPROLOL TARTRATE 25 MG PO TABS: 12.5000 mg | ORAL_TABLET | Freq: Two times a day (BID) | ORAL | 3 refills | Status: DC

## 2019-06-16 NOTE — Telephone Encounter (Signed)
Refilled lopressor

## 2019-06-23 ENCOUNTER — Other Ambulatory Visit (HOSPITAL_COMMUNITY): Payer: Self-pay | Admitting: Internal Medicine

## 2019-06-23 ENCOUNTER — Other Ambulatory Visit: Payer: Self-pay

## 2019-06-23 ENCOUNTER — Ambulatory Visit (HOSPITAL_COMMUNITY)
Admission: RE | Admit: 2019-06-23 | Discharge: 2019-06-23 | Disposition: A | Discharge status: Home / Self Care | Payer: Medicare Other | Source: Ambulatory Visit | Attending: Internal Medicine | Admitting: Internal Medicine

## 2019-06-23 DIAGNOSIS — M7989 Other specified soft tissue disorders: Secondary | ICD-10-CM | POA: Insufficient documentation

## 2019-06-23 DIAGNOSIS — N184 Chronic kidney disease, stage 4 (severe): Secondary | ICD-10-CM | POA: Diagnosis not present

## 2019-06-23 DIAGNOSIS — M79671 Pain in right foot: Secondary | ICD-10-CM | POA: Diagnosis not present

## 2019-06-23 DIAGNOSIS — M79604 Pain in right leg: Secondary | ICD-10-CM | POA: Diagnosis not present

## 2019-06-23 DIAGNOSIS — Z23 Encounter for immunization: Secondary | ICD-10-CM | POA: Diagnosis not present

## 2019-06-23 DIAGNOSIS — J42 Unspecified chronic bronchitis: Secondary | ICD-10-CM | POA: Diagnosis not present

## 2019-06-23 DIAGNOSIS — R0602 Shortness of breath: Secondary | ICD-10-CM | POA: Diagnosis not present

## 2019-06-23 DIAGNOSIS — E1142 Type 2 diabetes mellitus with diabetic polyneuropathy: Secondary | ICD-10-CM | POA: Diagnosis not present

## 2019-06-23 DIAGNOSIS — S99921A Unspecified injury of right foot, initial encounter: Secondary | ICD-10-CM | POA: Diagnosis not present

## 2019-07-08 ENCOUNTER — Other Ambulatory Visit: Payer: Self-pay

## 2019-07-08 NOTE — Patient Outreach (Signed)
[facility]) Care Management  07/08/2019  Steve [name] 1936/04/20 [id number]   Medication Adherence call to Steve [name] HIPPA Compliant Voice message left with a call back number. Steve [name] is showing past due on Tradjenta 5 mg under [place].   [place] Management Direct Dial [phone]  Fax [phone] [name]

## 2019-07-11 DIAGNOSIS — I129 Hypertensive chronic kidney disease with stage 1 through stage 4 chronic kidney disease, or unspecified chronic kidney disease: Secondary | ICD-10-CM | POA: Diagnosis not present

## 2019-07-11 DIAGNOSIS — E1129 Type 2 diabetes mellitus with other diabetic kidney complication: Secondary | ICD-10-CM | POA: Diagnosis not present

## 2019-07-11 DIAGNOSIS — R809 Proteinuria, unspecified: Secondary | ICD-10-CM | POA: Diagnosis not present

## 2019-07-11 DIAGNOSIS — I509 Heart failure, unspecified: Secondary | ICD-10-CM | POA: Diagnosis not present

## 2019-07-11 DIAGNOSIS — D649 Anemia, unspecified: Secondary | ICD-10-CM | POA: Diagnosis not present

## 2019-07-11 DIAGNOSIS — N184 Chronic kidney disease, stage 4 (severe): Secondary | ICD-10-CM | POA: Diagnosis not present

## 2019-07-15 DIAGNOSIS — R809 Proteinuria, unspecified: Secondary | ICD-10-CM | POA: Diagnosis not present

## 2019-07-15 DIAGNOSIS — E211 Secondary hyperparathyroidism, not elsewhere classified: Secondary | ICD-10-CM | POA: Diagnosis not present

## 2019-07-15 DIAGNOSIS — D631 Anemia in chronic kidney disease: Secondary | ICD-10-CM | POA: Diagnosis not present

## 2019-07-15 DIAGNOSIS — E559 Vitamin D deficiency, unspecified: Secondary | ICD-10-CM | POA: Diagnosis not present

## 2019-07-15 DIAGNOSIS — N189 Chronic kidney disease, unspecified: Secondary | ICD-10-CM | POA: Diagnosis not present

## 2019-07-18 ENCOUNTER — Other Ambulatory Visit: Payer: Self-pay

## 2019-07-18 NOTE — Patient Outreach (Signed)
[facility]) Care Management  07/18/2019  Steve [name] 11-26-1935 [id number]   Medication Adherence call to Mr. Steve [name] [facility] Identifiers Verify spoke with patient he is past due on Tradjenta  5 mg patient explain he is taking 1 tablet daily patient was receiving samples from doctors office but last month he had to pay$73dollars, explain medication is too expensive he will either have to stop taking it or switch doctor because he can not afford to pay,patient was taking Metformin before and wants to go back to taking metformin if there is no help on Tradjent,patient is going to be refer to Steve [name](rph) for Medication Assistance. Mr. Steve [name] is showing past due under [place].   [facility] Management Direct Dial [phone]  Fax [phone] [email or web address]

## 2019-07-23 DIAGNOSIS — J42 Unspecified chronic bronchitis: Secondary | ICD-10-CM | POA: Diagnosis not present

## 2019-07-23 DIAGNOSIS — R0602 Shortness of breath: Secondary | ICD-10-CM | POA: Diagnosis not present

## 2019-07-24 ENCOUNTER — Other Ambulatory Visit: Payer: Self-pay

## 2019-07-24 ENCOUNTER — Encounter (HOSPITAL_COMMUNITY): Payer: Self-pay | Admitting: *Deleted

## 2019-07-24 DIAGNOSIS — Z7984 Long term (current) use of oral hypoglycemic drugs: Secondary | ICD-10-CM | POA: Diagnosis not present

## 2019-07-24 DIAGNOSIS — I10 Essential (primary) hypertension: Secondary | ICD-10-CM | POA: Diagnosis not present

## 2019-07-24 DIAGNOSIS — Z87891 Personal history of nicotine dependence: Secondary | ICD-10-CM | POA: Diagnosis not present

## 2019-07-24 DIAGNOSIS — N184 Chronic kidney disease, stage 4 (severe): Secondary | ICD-10-CM | POA: Insufficient documentation

## 2019-07-24 DIAGNOSIS — Z8546 Personal history of malignant neoplasm of prostate: Secondary | ICD-10-CM | POA: Insufficient documentation

## 2019-07-24 DIAGNOSIS — R0789 Other chest pain: Secondary | ICD-10-CM | POA: Diagnosis present

## 2019-07-24 DIAGNOSIS — I129 Hypertensive chronic kidney disease with stage 1 through stage 4 chronic kidney disease, or unspecified chronic kidney disease: Secondary | ICD-10-CM | POA: Diagnosis not present

## 2019-07-24 DIAGNOSIS — E1122 Type 2 diabetes mellitus with diabetic chronic kidney disease: Secondary | ICD-10-CM | POA: Diagnosis not present

## 2019-07-24 DIAGNOSIS — N289 Disorder of kidney and ureter, unspecified: Secondary | ICD-10-CM | POA: Diagnosis not present

## 2019-07-24 DIAGNOSIS — Z79899 Other long term (current) drug therapy: Secondary | ICD-10-CM | POA: Insufficient documentation

## 2019-07-24 NOTE — ED Triage Notes (Signed)
Pt c/o elevated blood pressure that has been getting worse over the past week, tonight pt had thumping in his ears, took his blood pressure at home and it was reading 240/117, bp 188/83 in triage

## 2019-07-25 ENCOUNTER — Emergency Department (HOSPITAL_COMMUNITY)
Admission: EM | Admit: 2019-07-25 | Discharge: 2019-07-25 | Disposition: A | Discharge status: Home / Self Care | Payer: Medicare Other | Attending: Emergency Medicine | Admitting: Emergency Medicine

## 2019-07-25 DIAGNOSIS — I10 Essential (primary) hypertension: Secondary | ICD-10-CM

## 2019-07-25 DIAGNOSIS — N289 Disorder of kidney and ureter, unspecified: Secondary | ICD-10-CM

## 2019-07-25 LAB — CBC WITH DIFFERENTIAL/PLATELET
Abs Immature Granulocytes: 0.03 10*3/uL (ref 0.00–0.07)
Basophils Absolute: 0 10*3/uL (ref 0.0–0.1)
Basophils Relative: 0 %
Eosinophils Absolute: 0.1 10*3/uL (ref 0.0–0.5)
Eosinophils Relative: 1 %
HCT: 35.3 % — ABNORMAL LOW (ref 39.0–52.0)
Hemoglobin: 11.6 g/dL — ABNORMAL LOW (ref 13.0–17.0)
Immature Granulocytes: 1 %
Lymphocytes Relative: 16 %
Lymphs Abs: 1 10*3/uL (ref 0.7–4.0)
MCH: 27 pg (ref 26.0–34.0)
MCHC: 32.9 g/dL (ref 30.0–36.0)
MCV: 82.3 fL (ref 80.0–100.0)
Monocytes Absolute: 0.7 10*3/uL (ref 0.1–1.0)
Monocytes Relative: 11 %
Neutro Abs: 4.4 10*3/uL (ref 1.7–7.7)
Neutrophils Relative %: 71 %
Platelets: 278 10*3/uL (ref 150–400)
RBC: 4.29 MIL/uL (ref 4.22–5.81)
RDW: 13.2 % (ref 11.5–15.5)
WBC: 6.1 10*3/uL (ref 4.0–10.5)
nRBC: 0 % (ref 0.0–0.2)

## 2019-07-25 LAB — COMPREHENSIVE METABOLIC PANEL
ALT: 21 U/L (ref 0–44)
AST: 21 U/L (ref 15–41)
Albumin: 4 g/dL (ref 3.5–5.0)
Alkaline Phosphatase: 81 U/L (ref 38–126)
Anion gap: 13 (ref 5–15)
BUN: 50 mg/dL — ABNORMAL HIGH (ref 8–23)
CO2: 19 mmol/L — ABNORMAL LOW (ref 22–32)
Calcium: 9.4 mg/dL (ref 8.9–10.3)
Chloride: 104 mmol/L (ref 98–111)
Creatinine, Ser: 4.03 mg/dL — ABNORMAL HIGH (ref 0.61–1.24)
GFR calc Af Amer: 15 mL/min — ABNORMAL LOW (ref >60)
GFR calc non Af Amer: 13 mL/min — ABNORMAL LOW (ref >60)
Glucose, Bld: 184 mg/dL — ABNORMAL HIGH (ref 70–99)
Potassium: 4.6 mmol/L (ref 3.5–5.1)
Sodium: 136 mmol/L (ref 135–145)
Total Bilirubin: 0.3 mg/dL (ref 0.3–1.2)
Total Protein: 8.2 g/dL — ABNORMAL HIGH (ref 6.5–8.1)

## 2019-07-25 LAB — TROPONIN I (HIGH SENSITIVITY)
Troponin I (High Sensitivity): 21 ng/L — ABNORMAL HIGH (ref <18)
Troponin I (High Sensitivity): 27 ng/L — ABNORMAL HIGH (ref <18)

## 2019-07-25 NOTE — Discharge Instructions (Addendum)
You need to be careful what you eat, I think the hotdogs and pigs feet probably had something to do with your blood pressure being so high.  The good news is your blood pressure medicine controlled your blood pressure tonight.  Please follow-up with Dr. Legrand Rams if your blood pressure continues to be elevated despite taking her medication.

## 2019-07-25 NOTE — ED Notes (Signed)
Pt wheeled to waiting room. Pt verbalized understanding of discharge instructions.   

## 2019-07-25 NOTE — ED Provider Notes (Signed)
[facility] EMERGENCY DEPARTMENT Provider Note   CSN: [id number] Arrival date & time: 07/24/19  2256   Time seen 1:40 AM  History   Chief Complaint Chief Complaint  Patient presents with  . Hypertension    HPI Steve [name] is a 83 y.o. male.     HPI patient states he checks his blood pressure in the evening, he does not do it every day but he does it most days.  He states today he started having a feeling of thumping in his ears and he has chronic tinnitus.  He states he had some right lateral chest and back tightness.  He denies visual changes, nausea, vomiting, new numbness or tingling of his extremities although he complains of burning in his legs that is old.  He states he took his blood pressure at 10 PM and his blood pressure was 240/117.  He normally takes his blood pressure medicine at 10 PM and he took them tonight.  He does admit that today he ate food that he does not normally eat and states he ate hotdogs today and pigs feet.  PCP Rosita Fire, MD   Past Medical History:  Diagnosis Date  . Arthritis   . CKD (chronic kidney disease) stage 4, GFR 15-29 ml/min (HCC)   . Essential hypertension   . Hyperlipidemia   . Prostate cancer ([place])   . Renal insufficiency   . Type 2 diabetes mellitus [facility])     Patient Active Problem List   Diagnosis Date Noted  . Fall at home, initial encounter 07/26/2018  . Acute left-sided weakness 07/25/2018  . Hyperlipidemia 07/25/2018  . CKD (chronic kidney disease), stage IV ([place]) 05/13/2018  . Chronic kidney disease, stage 4 (severe) ([place]) 05/13/2018  . Chest pain 05/12/2018  . Elevated troponin 07/02/2017  . DM (diabetes mellitus), type 2 with renal complications ([place]) 42/59/5638  . IBS (irritable bowel syndrome) 01/28/2017  . Prostate cancer ([place]) 07/10/2014  . Abnormal ECG 04/12/2013  . HTN (hypertension) 04/12/2013  . SOB (shortness of breath) 04/12/2013  . Type 2 diabetes mellitus with diabetic nephropathy ([place])  01/11/2013  . CHEST PAIN-UNSPECIFIED 06/12/2009    Past Surgical History:  Procedure Laterality Date  . CATARACT EXTRACTION W/PHACO Right 04/28/2016   Procedure: CATARACT EXTRACTION PHACO AND INTRAOCULAR LENS PLACEMENT (IOC);  Surgeon: Williams Che, MD;  Location: [facility] ORS;  Service: Ophthalmology;  Laterality: Right;  CDE: 4.28  . COLONOSCOPY     about 2 years in [place]   . CYSTECTOMY          Home Medications    Prior to Admission medications   Medication Sig Start Date End Date Taking? Authorizing Provider  albuterol (PROVENTIL HFA;VENTOLIN HFA) 108 (90 Base) MCG/ACT inhaler Inhale 1-2 puffs into the lungs every 6 (six) hours as needed for wheezing or shortness of breath.    [provider]  Ascorbic Acid (VITAMIN C) 1000 MG tablet Take by mouth.    [provider]  aspirin 81 MG tablet Take 81 mg by mouth daily.    [provider]  Cholecalciferol (VITAMIN D-3) 1000 UNITS CAPS Take 1 capsule by mouth once a week.     [provider]  docusate sodium (COLACE) 100 MG capsule Take 100 mg by mouth daily.     [provider]  fluticasone Asencion Islam) 50 MCG/ACT nasal spray  01/14/19   [provider]  furosemide (LASIX) 20 MG tablet Take 1 tablet (20 mg total) by mouth daily for 5  days. 11/14/18 06/07/19  Noemi Chapel, MD  gabapentin (NEURONTIN) 300 MG capsule Take 1 capsule by mouth 2 (two) times daily. 01/19/15   [provider]  hydrALAZINE (APRESOLINE) 50 MG tablet Take 50 mg by mouth 3 (three) times daily.    [provider]  linagliptin (TRADJENTA) 5 MG TABS tablet Take 5 mg by mouth daily.    [provider]  Lysine 500 MG CAPS Take 500 mg by mouth daily.    [provider]  metoprolol tartrate (LOPRESSOR) 25 MG tablet Take 0.5 tablets (12.5 mg total) by mouth 2 (two) times daily. 06/16/19   Herminio Commons, MD  NIFEdipine (PROCARDIA XL/ADALAT-CC) 90 MG 24 hr tablet TAKE (1) TABLET BY MOUTH ONCE  DAILY. 06/17/18   Herminio Commons, MD  omeprazole (PRILOSEC) 20 MG capsule Take 1 capsule by mouth 2 (two) times daily. 04/06/13   [provider]  pravastatin (PRAVACHOL) 40 MG tablet Take 1 tablet by mouth every evening.  04/06/13   [provider]  tamsulosin (FLOMAX) 0.4 MG CAPS Take 1 capsule by mouth daily.  04/06/13   [provider]  valACYclovir (VALTREX) 1000 MG tablet Take 1 tablet (1,000 mg total) by mouth 3 (three) times daily. 04/09/19   Milton Ferguson, MD    Family History Family History  Problem Relation Age of Onset  . Heart disease Mother        cause of death  . Diabetes Sister   . Colon cancer Neg Hx     Social History Social History   Tobacco Use  . Smoking status: Former Smoker    Packs/day: 2.00    Years: 25.00    Pack years: 50.00    Types: Pipe, Cigars    Start date: 11/08/1954    Quit date: 10/14/1987    Years since quitting: 31.8  . Smokeless tobacco: Never Used  . Tobacco comment: About 2 pipes a day  Substance Use Topics  . Alcohol use: No    Alcohol/week: 0.0 standard drinks  . Drug use: No     Allergies   Patient has no known allergies.   Review of Systems Review of Systems  All other systems reviewed and are negative.    Physical Exam Updated Vital Signs BP (!) 188/83   Pulse 88   Temp 98.1 F (36.7 C) (Oral)   Resp 18   Ht 5\' 5"  (1.651 m)   Wt 79.4 kg   SpO2 100%   BMI 29.12 kg/m   Physical Exam Vitals signs and nursing note reviewed.  Constitutional:      Appearance: Normal appearance. He is normal weight.  HENT:     Head: Normocephalic and atraumatic.     Right Ear: External ear normal.     Left Ear: External ear normal.     Nose: Nose normal.     Mouth/Throat:     Mouth: Mucous membranes are moist.     Pharynx: Oropharynx is clear. No oropharyngeal exudate or posterior oropharyngeal erythema.  Eyes:     Extraocular Movements: Extraocular movements intact.     Conjunctiva/sclera:  Conjunctivae normal.     Pupils: Pupils are equal, round, and reactive to light.  Neck:     Musculoskeletal: Normal range of motion.  Cardiovascular:     Rate and Rhythm: Normal rate and regular rhythm.     Pulses: Normal pulses.     Heart sounds: Normal heart sounds.  Pulmonary:     Effort: Pulmonary effort is  normal. No respiratory distress.     Breath sounds: Normal breath sounds. No wheezing or rales.  Musculoskeletal: Normal range of motion.        General: No swelling.  Skin:    General: Skin is warm and dry.  Neurological:     General: No focal deficit present.     Mental Status: He is alert and oriented to person, place, and time.     Cranial Nerves: No cranial nerve deficit.  Psychiatric:        Mood and Affect: Mood normal.        Behavior: Behavior normal.        Thought Content: Thought content normal.      ED Treatments / Results  Labs (all labs ordered are listed, but only abnormal results are displayed) Results for orders placed or performed during the hospital encounter of 07/25/19  CBC with Differential  Result Value Ref Range   WBC 6.1 4.0 - 10.5 K/uL   RBC 4.29 4.22 - 5.81 MIL/uL   Hemoglobin 11.6 (L) 13.0 - 17.0 g/dL   HCT 35.3 (L) 39.0 - 52.0 %   MCV 82.3 80.0 - 100.0 fL   MCH 27.0 26.0 - 34.0 pg   MCHC 32.9 30.0 - 36.0 g/dL   RDW 13.2 11.5 - 15.5 %   Platelets 278 150 - 400 K/uL   nRBC 0.0 0.0 - 0.2 %   Neutrophils Relative % 71 %   Neutro Abs 4.4 1.7 - 7.7 K/uL   Lymphocytes Relative 16 %   Lymphs Abs 1.0 0.7 - 4.0 K/uL   Monocytes Relative 11 %   Monocytes Absolute 0.7 0.1 - 1.0 K/uL   Eosinophils Relative 1 %   Eosinophils Absolute 0.1 0.0 - 0.5 K/uL   Basophils Relative 0 %   Basophils Absolute 0.0 0.0 - 0.1 K/uL   Immature Granulocytes 1 %   Abs Immature Granulocytes 0.03 0.00 - 0.07 K/uL  Comprehensive metabolic panel  Result Value Ref Range   Sodium 136 135 - 145 mmol/L   Potassium 4.6 3.5 - 5.1 mmol/L   Chloride 104 98 - 111  mmol/L   CO2 19 (L) 22 - 32 mmol/L   Glucose, Bld 184 (H) 70 - 99 mg/dL   BUN 50 (H) 8 - 23 mg/dL   Creatinine, Ser 4.03 (H) 0.61 - 1.24 mg/dL   Calcium 9.4 8.9 - 10.3 mg/dL   Total Protein 8.2 (H) 6.5 - 8.1 g/dL   Albumin 4.0 3.5 - 5.0 g/dL   AST 21 15 - 41 U/L   ALT 21 0 - 44 U/L   Alkaline Phosphatase 81 38 - 126 U/L   Total Bilirubin 0.3 0.3 - 1.2 mg/dL   GFR calc non Af Amer 13 (L) >60 mL/min   GFR calc Af Amer 15 (L) >60 mL/min   Anion gap 13 5 - 15  Troponin I (High Sensitivity)  Result Value Ref Range   Troponin I (High Sensitivity) 21 (H) <18 ng/L  Troponin I (High Sensitivity)  Result Value Ref Range   Troponin I (High Sensitivity) 27 (H) <18 ng/L   Laboratory interpretation all normal except a worsening renal insufficiency over the past year with a creatinine of 3 and BUN of 31 a year ago, and BUN and creatinine 7 months ago of 41 and 3.78 and now 50 and 4.03.    EKG EKG Interpretation  Date/Time:  Sunday July 24 2019 23:46:30 EDT Ventricular Rate:  82 PR Interval:  166 QRS Duration: 100 QT Interval:  356 QTC Calculation: 415 R Axis:   -45 Text Interpretation:  Sinus rhythm with Premature atrial complexes Left anterior fascicular block Nonspecific ST and T wave abnormality No significant change since last tracing 01 Dec 2018 Confirmed by Rolland Porter [phone]) on 07/24/2019 11:54:08 PM   Radiology No results found.  Procedures Procedures (including critical care time)  Medications Ordered in ED Medications - No data to display   Initial Impression / Assessment and Plan / ED Course  I have reviewed the triage vital signs and the nursing notes.  Pertinent labs & imaging results that were available during my care of the patient were reviewed by me and considered in my medical decision making (see chart for details).       Patient's blood pressure in triage was 188/83.  I hooked up patient's blood pressure cuff and checked his blood pressure and it is  143/72 during my exam.  He states he feels fine now.  3:00 AM we discussed his test results that had resulted so far.  His blood pressure is 113/88 without any treatment in the ED. we discussed that his diet today probably influence the high blood pressure reading he had gotten at home before coming to the ED.  Fortunately he took his evening dose of hypertensive medications and it has controlled his blood pressure.  Final Clinical Impressions(s) / ED Diagnoses   Final diagnoses:  Essential hypertension  Renal insufficiency    ED Discharge Orders    None     Plan discharge  Rolland Porter, MD, Barbette Or, MD 07/25/19 [phone]

## 2019-07-26 ENCOUNTER — Ambulatory Visit (INDEPENDENT_AMBULATORY_CARE_PROVIDER_SITE_OTHER): Payer: Medicare Other | Admitting: Urology

## 2019-07-26 DIAGNOSIS — Z8546 Personal history of malignant neoplasm of prostate: Secondary | ICD-10-CM | POA: Diagnosis not present

## 2019-08-01 DIAGNOSIS — K219 Gastro-esophageal reflux disease without esophagitis: Secondary | ICD-10-CM | POA: Diagnosis not present

## 2019-08-01 DIAGNOSIS — E1142 Type 2 diabetes mellitus with diabetic polyneuropathy: Secondary | ICD-10-CM | POA: Diagnosis not present

## 2019-08-10 ENCOUNTER — Ambulatory Visit: Payer: Medicare Other | Admitting: Orthopaedic Surgery

## 2019-08-10 ENCOUNTER — Other Ambulatory Visit: Payer: Self-pay

## 2019-08-18 ENCOUNTER — Other Ambulatory Visit: Payer: Self-pay

## 2019-08-18 NOTE — Patient Outreach (Signed)
[facility]) Care Management  08/18/2019  Silvino Selman [birth year] [id number]   Medication Adherence call to Mr. Wymon Swaney [facility] Identifiers Verify spoke with patient he is past due on Pravastatin 40 mg,patient explain doctor took him off for a while because he was having side effects(leg cramps) Mr. Godinho is showing past due under [place].   [facility] Management Direct Dial [phone]  Fax [phone] [email or web address]

## 2019-08-19 DIAGNOSIS — Z87891 Personal history of nicotine dependence: Secondary | ICD-10-CM | POA: Diagnosis not present

## 2019-08-19 DIAGNOSIS — I700 Atherosclerosis of aorta: Secondary | ICD-10-CM | POA: Diagnosis not present

## 2019-08-19 DIAGNOSIS — I129 Hypertensive chronic kidney disease with stage 1 through stage 4 chronic kidney disease, or unspecified chronic kidney disease: Secondary | ICD-10-CM | POA: Diagnosis not present

## 2019-08-19 DIAGNOSIS — E7800 Pure hypercholesterolemia, unspecified: Secondary | ICD-10-CM | POA: Diagnosis not present

## 2019-08-19 DIAGNOSIS — Z7982 Long term (current) use of aspirin: Secondary | ICD-10-CM | POA: Diagnosis not present

## 2019-08-19 DIAGNOSIS — R109 Unspecified abdominal pain: Secondary | ICD-10-CM | POA: Diagnosis not present

## 2019-08-19 DIAGNOSIS — N189 Chronic kidney disease, unspecified: Secondary | ICD-10-CM | POA: Diagnosis not present

## 2019-08-19 DIAGNOSIS — E1140 Type 2 diabetes mellitus with diabetic neuropathy, unspecified: Secondary | ICD-10-CM | POA: Diagnosis not present

## 2019-08-19 DIAGNOSIS — E1122 Type 2 diabetes mellitus with diabetic chronic kidney disease: Secondary | ICD-10-CM | POA: Diagnosis not present

## 2019-08-19 DIAGNOSIS — R0902 Hypoxemia: Secondary | ICD-10-CM | POA: Diagnosis not present

## 2019-08-19 DIAGNOSIS — E875 Hyperkalemia: Secondary | ICD-10-CM | POA: Diagnosis not present

## 2019-08-19 DIAGNOSIS — R52 Pain, unspecified: Secondary | ICD-10-CM | POA: Diagnosis not present

## 2019-08-19 DIAGNOSIS — N184 Chronic kidney disease, stage 4 (severe): Secondary | ICD-10-CM | POA: Diagnosis not present

## 2019-08-19 DIAGNOSIS — R1011 Right upper quadrant pain: Secondary | ICD-10-CM | POA: Diagnosis not present

## 2019-08-19 DIAGNOSIS — Z79899 Other long term (current) drug therapy: Secondary | ICD-10-CM | POA: Diagnosis not present

## 2019-08-19 DIAGNOSIS — R1084 Generalized abdominal pain: Secondary | ICD-10-CM | POA: Diagnosis not present

## 2019-08-19 DIAGNOSIS — K5900 Constipation, unspecified: Secondary | ICD-10-CM | POA: Diagnosis not present

## 2019-08-23 DIAGNOSIS — J42 Unspecified chronic bronchitis: Secondary | ICD-10-CM | POA: Diagnosis not present

## 2019-08-23 DIAGNOSIS — R0602 Shortness of breath: Secondary | ICD-10-CM | POA: Diagnosis not present

## 2019-09-01 DIAGNOSIS — N184 Chronic kidney disease, stage 4 (severe): Secondary | ICD-10-CM | POA: Diagnosis not present

## 2019-09-01 DIAGNOSIS — E1142 Type 2 diabetes mellitus with diabetic polyneuropathy: Secondary | ICD-10-CM | POA: Diagnosis not present

## 2019-09-07 ENCOUNTER — Encounter: Payer: Self-pay | Admitting: Orthopaedic Surgery

## 2019-09-07 ENCOUNTER — Other Ambulatory Visit: Payer: Self-pay

## 2019-09-07 ENCOUNTER — Ambulatory Visit (INDEPENDENT_AMBULATORY_CARE_PROVIDER_SITE_OTHER): Payer: Medicare Other | Admitting: Orthopaedic Surgery

## 2019-09-07 VITALS — BP 141/83 | HR 82 | Ht 65.0 in | Wt 176.0 lb

## 2019-09-07 DIAGNOSIS — M21372 Foot drop, left foot: Secondary | ICD-10-CM

## 2019-09-07 DIAGNOSIS — M25561 Pain in right knee: Secondary | ICD-10-CM | POA: Diagnosis not present

## 2019-09-07 DIAGNOSIS — E1042 Type 1 diabetes mellitus with diabetic polyneuropathy: Secondary | ICD-10-CM | POA: Diagnosis not present

## 2019-09-07 DIAGNOSIS — G8929 Other chronic pain: Secondary | ICD-10-CM | POA: Diagnosis not present

## 2019-09-07 NOTE — Progress Notes (Signed)
Patient ID:Steve [name], male DOB:1936-09-20, 83 y.o. ZOX:[id number]  Chief Complaint  Patient presents with  . Knee Pain    R//hurts when walking    HPI  Steve [name] is a 83 y.o. male who has peripheral neuropathy with left foot drop.  He uses his AFO but not all the time.  He says he just likes to come out of sometime.  He knows it helps.  He uses a cane.  He has some pain of the right knee today but no effusion, no trauma.  He says some days he just feels weaker than other days.   Body mass index is 29.29 kg/m.  ROS  Review of Systems  Constitutional: Positive for activity change.  Musculoskeletal: Positive for arthralgias and gait problem.  All other systems reviewed and are negative.   All other systems reviewed and are negative.  The following is a summary of the past history medically, past history surgically, known current medicines, social history and family history.  This information is gathered electronically by the computer from prior information and documentation.  I review this each visit and have found including this information at this point in the chart is beneficial and informative.    Past Medical History:  Diagnosis Date  . Arthritis   . CKD (chronic kidney disease) stage 4, GFR 15-29 ml/min (HCC)   . Essential hypertension   . Hyperlipidemia   . Prostate cancer ([place])   . Renal insufficiency   . Type 2 diabetes mellitus ([place])     Past Surgical History:  Procedure Laterality Date  . CATARACT EXTRACTION W/PHACO Right 04/28/2016   Procedure: CATARACT EXTRACTION PHACO AND INTRAOCULAR LENS PLACEMENT (IOC);  Surgeon: Williams Che, MD;  Location: [facility] ORS;  Service: Ophthalmology;  Laterality: Right;  CDE: 4.28  . COLONOSCOPY     about 2 years in [place]   . CYSTECTOMY      Family History  Problem Relation Age of Onset  . Heart disease Mother        cause of death  . Diabetes Sister   . Colon cancer Neg Hx     Social History Social History    Tobacco Use  . Smoking status: Former Smoker    Packs/day: 2.00    Years: 25.00    Pack years: 50.00    Types: Pipe, Cigars    Start date: 11/08/1954    Quit date: 10/14/1987    Years since quitting: 31.9  . Smokeless tobacco: Never Used  . Tobacco comment: About 2 pipes a day  Substance Use Topics  . Alcohol use: No    Alcohol/week: 0.0 standard drinks  . Drug use: No    No Known Allergies  Current Outpatient Medications  Medication Sig Dispense Refill  . albuterol (PROVENTIL HFA;VENTOLIN HFA) 108 (90 Base) MCG/ACT inhaler Inhale 1-2 puffs into the lungs every 6 (six) hours as needed for wheezing or shortness of breath.    . Ascorbic Acid (VITAMIN C) 1000 MG tablet Take by mouth.    Marland Kitchen aspirin 81 MG tablet Take 81 mg by mouth daily.    . Cholecalciferol (VITAMIN D-3) 1000 UNITS CAPS Take 1 capsule by mouth once a week.     . docusate sodium (COLACE) 100 MG capsule Take 100 mg by mouth daily.     . fluticasone (FLONASE) 50 MCG/ACT nasal spray     . furosemide (LASIX) 20 MG tablet Take 1 tablet (20 mg total) by mouth daily for 5 days. 5 tablet  0  . gabapentin (NEURONTIN) 300 MG capsule Take 1 capsule by mouth 2 (two) times daily.    . hydrALAZINE (APRESOLINE) 50 MG tablet Take 50 mg by mouth 3 (three) times daily.    Marland Kitchen linagliptin (TRADJENTA) 5 MG TABS tablet Take 5 mg by mouth daily.    Marland Kitchen Lysine 500 MG CAPS Take 500 mg by mouth daily.    . metoprolol tartrate (LOPRESSOR) 25 MG tablet Take 0.5 tablets (12.5 mg total) by mouth 2 (two) times daily. 90 tablet 3  . NIFEdipine (PROCARDIA XL/ADALAT-CC) 90 MG 24 hr tablet TAKE (1) TABLET BY MOUTH ONCE DAILY. 30 tablet 6  . omeprazole (PRILOSEC) 20 MG capsule Take 1 capsule by mouth 2 (two) times daily.    . pravastatin (PRAVACHOL) 40 MG tablet Take 1 tablet by mouth every evening.     . tamsulosin (FLOMAX) 0.4 MG CAPS Take 1 capsule by mouth daily.     . valACYclovir (VALTREX) 1000 MG tablet Take 1 tablet (1,000 mg total) by mouth 3  (three) times daily. 21 tablet 0   No current facility-administered medications for this visit.      Physical Exam  Blood pressure (!) 141/83, pulse 82, height 5\' 5"  (1.651 m), weight 176 lb (79.8 kg).  Constitutional: overall normal hygiene, normal nutrition, well developed, normal grooming, normal body habitus. Assistive device:cane, AFO left  Musculoskeletal: gait and station Limp left, muscle tone and strength are normal, no tremors or atrophy is present.  .  Neurological: coordination overall normal.  Deep tendon reflex/nerve stretch intact.  Sensation abnormal lower extremities, decreased with left peroneal nerve palsy.  Cranial nerves II-XII intact.   Skin:   Normal overall no scars, lesions, ulcers or rashes. No psoriasis.  Psychiatric: Alert and oriented x 3.  Recent memory intact, remote memory unclear.  Normal mood and affect. Well groomed.  Good eye contact.  Cardiovascular: overall no swelling, no varicosities, no edema bilaterally, normal temperatures of the legs and arms, no clubbing, cyanosis and good capillary refill.  Lymphatic: palpation is normal.  Right knee tender, slight effusion, slight crepitus, ROM full, stable.   All other systems reviewed and are negative   The patient has been educated about the nature of the problem(s) and counseled on treatment options.  The patient appeared to understand what I have discussed and is in agreement with it.  Encounter Diagnoses  Name Primary?  . Chronic pain of right knee Yes  . Diabetic peripheral neuropathy associated with type 1 diabetes mellitus ([place])   . Left foot drop     PLAN Call if any problems.  Precautions discussed.  Continue current medications.   Return to clinic 3 months   Electronically Signed Sanjuana Kava, MD 11/25/202010:11 AM

## 2019-09-13 ENCOUNTER — Other Ambulatory Visit: Payer: Self-pay

## 2019-09-13 DIAGNOSIS — Z20822 Contact with and (suspected) exposure to covid-19: Secondary | ICD-10-CM

## 2019-09-15 LAB — NOVEL CORONAVIRUS, NAA: SARS-CoV-2, NAA: NOT DETECTED

## 2019-09-22 DIAGNOSIS — R0602 Shortness of breath: Secondary | ICD-10-CM | POA: Diagnosis not present

## 2019-09-22 DIAGNOSIS — J42 Unspecified chronic bronchitis: Secondary | ICD-10-CM | POA: Diagnosis not present

## 2019-09-27 DIAGNOSIS — D631 Anemia in chronic kidney disease: Secondary | ICD-10-CM | POA: Diagnosis not present

## 2019-09-27 DIAGNOSIS — Z79899 Other long term (current) drug therapy: Secondary | ICD-10-CM | POA: Diagnosis not present

## 2019-09-27 DIAGNOSIS — N184 Chronic kidney disease, stage 4 (severe): Secondary | ICD-10-CM | POA: Diagnosis not present

## 2019-09-27 DIAGNOSIS — E559 Vitamin D deficiency, unspecified: Secondary | ICD-10-CM | POA: Diagnosis not present

## 2019-09-27 DIAGNOSIS — R809 Proteinuria, unspecified: Secondary | ICD-10-CM | POA: Diagnosis not present

## 2019-09-29 DIAGNOSIS — E1142 Type 2 diabetes mellitus with diabetic polyneuropathy: Secondary | ICD-10-CM | POA: Diagnosis not present

## 2019-09-29 DIAGNOSIS — K219 Gastro-esophageal reflux disease without esophagitis: Secondary | ICD-10-CM | POA: Diagnosis not present

## 2019-09-29 DIAGNOSIS — N184 Chronic kidney disease, stage 4 (severe): Secondary | ICD-10-CM | POA: Diagnosis not present

## 2019-09-29 DIAGNOSIS — I10 Essential (primary) hypertension: Secondary | ICD-10-CM | POA: Diagnosis not present

## 2019-10-03 DIAGNOSIS — R809 Proteinuria, unspecified: Secondary | ICD-10-CM | POA: Diagnosis not present

## 2019-10-03 DIAGNOSIS — N189 Chronic kidney disease, unspecified: Secondary | ICD-10-CM | POA: Diagnosis not present

## 2019-10-03 DIAGNOSIS — D631 Anemia in chronic kidney disease: Secondary | ICD-10-CM | POA: Diagnosis not present

## 2019-10-03 DIAGNOSIS — E559 Vitamin D deficiency, unspecified: Secondary | ICD-10-CM | POA: Diagnosis not present

## 2019-10-03 DIAGNOSIS — E211 Secondary hyperparathyroidism, not elsewhere classified: Secondary | ICD-10-CM | POA: Diagnosis not present

## 2019-10-11 DIAGNOSIS — R809 Proteinuria, unspecified: Secondary | ICD-10-CM | POA: Diagnosis not present

## 2019-10-11 DIAGNOSIS — D631 Anemia in chronic kidney disease: Secondary | ICD-10-CM | POA: Diagnosis not present

## 2019-10-11 DIAGNOSIS — E1122 Type 2 diabetes mellitus with diabetic chronic kidney disease: Secondary | ICD-10-CM | POA: Diagnosis not present

## 2019-10-11 DIAGNOSIS — N189 Chronic kidney disease, unspecified: Secondary | ICD-10-CM | POA: Diagnosis not present

## 2019-10-11 DIAGNOSIS — E1129 Type 2 diabetes mellitus with other diabetic kidney complication: Secondary | ICD-10-CM | POA: Diagnosis not present

## 2019-10-23 DIAGNOSIS — R0602 Shortness of breath: Secondary | ICD-10-CM | POA: Diagnosis not present

## 2019-10-23 DIAGNOSIS — J42 Unspecified chronic bronchitis: Secondary | ICD-10-CM | POA: Diagnosis not present

## 2019-10-27 ENCOUNTER — Other Ambulatory Visit: Payer: Self-pay

## 2019-10-27 DIAGNOSIS — Z8546 Personal history of malignant neoplasm of prostate: Secondary | ICD-10-CM

## 2019-10-30 DIAGNOSIS — K219 Gastro-esophageal reflux disease without esophagitis: Secondary | ICD-10-CM | POA: Diagnosis not present

## 2019-10-30 DIAGNOSIS — I10 Essential (primary) hypertension: Secondary | ICD-10-CM | POA: Diagnosis not present

## 2019-11-09 ENCOUNTER — Other Ambulatory Visit: Payer: Self-pay

## 2019-11-09 DIAGNOSIS — N184 Chronic kidney disease, stage 4 (severe): Secondary | ICD-10-CM

## 2019-11-10 ENCOUNTER — Encounter: Payer: Self-pay | Admitting: Vascular Surgery

## 2019-11-10 ENCOUNTER — Ambulatory Visit (INDEPENDENT_AMBULATORY_CARE_PROVIDER_SITE_OTHER)
Admission: RE | Admit: 2019-11-10 | Discharge: 2019-11-10 | Disposition: A | Discharge status: Home / Self Care | Payer: Medicare Other | Source: Ambulatory Visit | Attending: Surgery | Admitting: Surgery

## 2019-11-10 ENCOUNTER — Ambulatory Visit (HOSPITAL_COMMUNITY)
Admission: RE | Admit: 2019-11-10 | Discharge: 2019-11-10 | Disposition: A | Discharge status: Home / Self Care | Payer: Medicare Other | Source: Ambulatory Visit | Attending: Surgery | Admitting: Surgery

## 2019-11-10 ENCOUNTER — Ambulatory Visit: Payer: Medicare Other | Admitting: Vascular Surgery

## 2019-11-10 ENCOUNTER — Other Ambulatory Visit: Payer: Self-pay

## 2019-11-10 VITALS — BP 150/74 | HR 84 | Temp 97.4°F | Resp 20 | Ht 65.0 in | Wt 175.0 lb

## 2019-11-10 DIAGNOSIS — N184 Chronic kidney disease, stage 4 (severe): Secondary | ICD-10-CM | POA: Diagnosis not present

## 2019-11-10 NOTE — Progress Notes (Signed)
Referring Physician: Dr Theador Hawthorne  Patient name: Steve [name] MRN: [id number] DOB: 09/24/1936 Sex: male  REASON FOR CONSULT: Hemodialysis access  HPI: Kuba Shepherd is a 84 y.o. male, for evaluation for placement of a long-term hemodialysis access.  He is currently CKD4.  He is right-handed.  He has not had any prior access procedures.  He does not take any anticoagulants.  Other medical problems include diabetes hyperlipidemia hypertension.  These have all been stable.  He is on aspirin and a statin.  Past Medical History:  Diagnosis Date  . Arthritis   . CKD (chronic kidney disease) stage 4, GFR 15-29 ml/min (HCC)   . Essential hypertension   . Hyperlipidemia   . Prostate cancer ([place])   . Renal insufficiency   . Type 2 diabetes mellitus ([place])    Past Surgical History:  Procedure Laterality Date  . CATARACT EXTRACTION W/PHACO Right 04/28/2016   Procedure: CATARACT EXTRACTION PHACO AND INTRAOCULAR LENS PLACEMENT (IOC);  Surgeon: Williams Che, MD;  Location: [facility]S;  Service: Ophthalmology;  Laterality: Right;  CDE: 4.28  . COLONOSCOPY     about 2 years in [place]   . CYSTECTOMY      Family History  Problem Relation Age of Onset  . Heart disease Mother        cause of death  . Diabetes Sister   . Colon cancer Neg Hx     SOCIAL HISTORY: Social History   Socioeconomic History  . Marital status: Divorced    Spouse name: Not on file  . Number of children: Not on file  . Years of education: Not on file  . Highest education level: Not on file  Occupational History  . Occupation: Retired  Tobacco Use  . Smoking status: Former Smoker    Packs/day: 2.00    Years: 25.00    Pack years: 50.00    Types: Pipe, Cigars    Start date: 11/08/1954    Quit date: 10/14/1987    Years since quitting: 32.0  . Smokeless tobacco: Never Used  . Tobacco comment: About 2 pipes a day  Substance and Sexual Activity  . Alcohol use: No    Alcohol/week: 0.0 standard drinks  . Drug use: No   . Sexual activity: Not on file  Other Topics Concern  . Not on file  Social History Narrative  . Not on file   Social Determinants of Health   Financial Resource Strain:   . Difficulty of Paying Living Expenses: Not on file  Food Insecurity:   . Worried About Charity fundraiser in the Last Year: Not on file  . Ran Out of Food in the Last Year: Not on file  Transportation Needs:   . Lack of Transportation (Medical): Not on file  . Lack of Transportation (Non-Medical): Not on file  Physical Activity:   . Days of Exercise per Week: Not on file  . Minutes of Exercise per Session: Not on file  Stress:   . Feeling of Stress : Not on file  Social Connections:   . Frequency of Communication with Friends and Family: Not on file  . Frequency of Social Gatherings with Friends and Family: Not on file  . Attends Religious Services: Not on file  . Active Member of Clubs or Organizations: Not on file  . Attends Archivist Meetings: Not on file  . Marital Status: Not on file  Intimate Partner Violence:   . Fear of Current or Ex-Partner: Not  on file  . Emotionally Abused: Not on file  . Physically Abused: Not on file  . Sexually Abused: Not on file    No Known Allergies  Current Outpatient Medications  Medication Sig Dispense Refill  . albuterol (PROVENTIL HFA;VENTOLIN HFA) 108 (90 Base) MCG/ACT inhaler Inhale 1-2 puffs into the lungs every 6 (six) hours as needed for wheezing or shortness of breath.    . Ascorbic Acid (VITAMIN C) 1000 MG tablet Take by mouth.    Marland Kitchen aspirin 81 MG tablet Take 81 mg by mouth daily.    . Cholecalciferol (VITAMIN D-3) 1000 UNITS CAPS Take 1 capsule by mouth once a week.     . docusate sodium (COLACE) 100 MG capsule Take 100 mg by mouth daily.     . fluticasone (FLONASE) 50 MCG/ACT nasal spray     . gabapentin (NEURONTIN) 300 MG capsule Take 1 capsule by mouth 2 (two) times daily.    . hydrALAZINE (APRESOLINE) 50 MG tablet Take 50 mg by mouth 3  (three) times daily.    Marland Kitchen linagliptin (TRADJENTA) 5 MG TABS tablet Take 5 mg by mouth daily.    Marland Kitchen Lysine 500 MG CAPS Take 500 mg by mouth daily.    . metoprolol tartrate (LOPRESSOR) 25 MG tablet Take 0.5 tablets (12.5 mg total) by mouth 2 (two) times daily. 90 tablet 3  . NIFEdipine (PROCARDIA XL/ADALAT-CC) 90 MG 24 hr tablet TAKE (1) TABLET BY MOUTH ONCE DAILY. 30 tablet 6  . omeprazole (PRILOSEC) 20 MG capsule Take 1 capsule by mouth 2 (two) times daily.    . pravastatin (PRAVACHOL) 40 MG tablet Take 1 tablet by mouth every evening.     . sodium bicarbonate 650 MG tablet Take by mouth.    . tamsulosin (FLOMAX) 0.4 MG CAPS Take 1 capsule by mouth daily.     . traMADol (ULTRAM) 50 MG tablet Take 50 mg by mouth 2 (two) times daily as needed.    . valACYclovir (VALTREX) 1000 MG tablet Take 1 tablet (1,000 mg total) by mouth 3 (three) times daily. 21 tablet 0  . zolpidem (AMBIEN) 5 MG tablet     . furosemide (LASIX) 20 MG tablet Take 1 tablet (20 mg total) by mouth daily for 5 days. 5 tablet 0   No current facility-administered medications for this visit.    ROS:   General:  No weight loss, Fever, chills  HEENT: No recent headaches, no nasal bleeding, no visual changes, no sore throat  Neurologic: No dizziness, blackouts, seizures. No recent symptoms of stroke or mini- stroke. No recent episodes of slurred speech, or temporary blindness.  Cardiac: No recent episodes of chest pain/pressure, no shortness of breath at rest.  No shortness of breath with exertion.  Denies history of atrial fibrillation or irregular heartbeat  Vascular: No history of rest pain in feet.  No history of claudication.  No history of non-healing ulcer, No history of DVT   Pulmonary: No home oxygen, no productive cough, no hemoptysis,  No asthma or wheezing  Musculoskeletal:  [X]  Arthritis, [ ]  Low back pain,  [X]  Joint pain  Hematologic:No history of hypercoagulable state.  No history of easy bleeding.  No history  of anemia  Gastrointestinal: No hematochezia or melena,  No gastroesophageal reflux, no trouble swallowing  Urinary: [X]  chronic Kidney disease, [ ]  on HD - [ ]  MWF or [ ]  TTHS, [ ]  Burning with urination, [ ]  Frequent urination, [ ]  Difficulty urinating;   Skin: No  rashes  Psychological: No history of anxiety,  No history of depression   Physical Examination  Vitals:   11/10/19 1243  BP: (!) 150/74  Pulse: 84  Resp: 20  Temp: (!) 97.4 F (36.3 C)  SpO2: 99%  Weight: 175 lb (79.4 kg)  Height: 5\' 5"  (1.651 m)    Body mass index is 29.12 kg/m.  General:  Alert and oriented, no acute distress HEENT: Normal Neck: No JVD Cardiac: Regular Rate and Rhythm  Skin: No rash Extremity Pulses:  2+ radial, brachial pulses bilaterally Musculoskeletal: No deformity or edema  Neurologic: Upper and lower extremity motor 5/5 and symmetric  DATA:  Patient had a vein mapping ultrasound today which shows no suitable veins for creation of a fistula.  All are less than 2 mm diameter.  She had normal arterial anatomy on upper extremity duplex exam.  I reviewed and interpreted both studies.  ASSESSMENT: Patient needs long-term hemodialysis access.  He does not have any vein suitable for a fistula.  He would require most likely a left upper arm AV graft.   PLAN: I will forward a copy of my note to Dr. Theador Hawthorne today.  If he wishes to go ahead and have hemodialysis access placed now we will schedule that in the near future.  If he wishes [place] to wait on a graft we will wait for him to refer the patient back to [place].   Ruta Hinds, MD [facility] Office: [phone] Pager: [phone]

## 2019-11-11 DIAGNOSIS — E1129 Type 2 diabetes mellitus with other diabetic kidney complication: Secondary | ICD-10-CM | POA: Diagnosis not present

## 2019-11-11 DIAGNOSIS — R809 Proteinuria, unspecified: Secondary | ICD-10-CM | POA: Diagnosis not present

## 2019-11-11 DIAGNOSIS — E211 Secondary hyperparathyroidism, not elsewhere classified: Secondary | ICD-10-CM | POA: Diagnosis not present

## 2019-11-11 DIAGNOSIS — E1122 Type 2 diabetes mellitus with diabetic chronic kidney disease: Secondary | ICD-10-CM | POA: Diagnosis not present

## 2019-11-11 DIAGNOSIS — D631 Anemia in chronic kidney disease: Secondary | ICD-10-CM | POA: Diagnosis not present

## 2019-11-11 DIAGNOSIS — N189 Chronic kidney disease, unspecified: Secondary | ICD-10-CM | POA: Diagnosis not present

## 2019-11-16 DIAGNOSIS — N189 Chronic kidney disease, unspecified: Secondary | ICD-10-CM | POA: Diagnosis not present

## 2019-11-16 DIAGNOSIS — E211 Secondary hyperparathyroidism, not elsewhere classified: Secondary | ICD-10-CM | POA: Diagnosis not present

## 2019-11-16 DIAGNOSIS — D631 Anemia in chronic kidney disease: Secondary | ICD-10-CM | POA: Diagnosis not present

## 2019-11-16 DIAGNOSIS — R809 Proteinuria, unspecified: Secondary | ICD-10-CM | POA: Diagnosis not present

## 2019-11-16 DIAGNOSIS — I129 Hypertensive chronic kidney disease with stage 1 through stage 4 chronic kidney disease, or unspecified chronic kidney disease: Secondary | ICD-10-CM | POA: Diagnosis not present

## 2019-11-23 DIAGNOSIS — J42 Unspecified chronic bronchitis: Secondary | ICD-10-CM | POA: Diagnosis not present

## 2019-11-23 DIAGNOSIS — R0602 Shortness of breath: Secondary | ICD-10-CM | POA: Diagnosis not present

## 2019-11-28 ENCOUNTER — Other Ambulatory Visit: Payer: Self-pay

## 2019-11-28 DIAGNOSIS — R300 Dysuria: Secondary | ICD-10-CM | POA: Diagnosis not present

## 2019-11-28 DIAGNOSIS — Z1389 Encounter for screening for other disorder: Secondary | ICD-10-CM | POA: Diagnosis not present

## 2019-11-28 DIAGNOSIS — E1142 Type 2 diabetes mellitus with diabetic polyneuropathy: Secondary | ICD-10-CM | POA: Diagnosis not present

## 2019-11-28 DIAGNOSIS — E785 Hyperlipidemia, unspecified: Secondary | ICD-10-CM | POA: Diagnosis not present

## 2019-11-28 DIAGNOSIS — Z0001 Encounter for general adult medical examination with abnormal findings: Secondary | ICD-10-CM | POA: Diagnosis not present

## 2019-11-28 DIAGNOSIS — N184 Chronic kidney disease, stage 4 (severe): Secondary | ICD-10-CM | POA: Diagnosis not present

## 2019-11-28 DIAGNOSIS — I10 Essential (primary) hypertension: Secondary | ICD-10-CM | POA: Diagnosis not present

## 2019-11-28 NOTE — Patient Outreach (Signed)
[facility]) Care Management  11/28/2019  Andrews Tener 1935-12-04 [id number]   Medication Adherence call to Mr, Kelven Flater [facility] Identifiers Verify spoke with patient he is past due on Tradjenta 5 mg,patient explain he takes 1 tablet daily,he explain he has been receiving samples from the doctors office and has medication at this time,he also explain he does not qualify to receive patients Assistance on this medication. Mr. Shellhammer is showing past due under [employer].  [facility] Direct Dial [phone]  Fax [phone] [email or web address]

## 2019-11-30 ENCOUNTER — Encounter: Payer: Self-pay | Admitting: Orthopaedic Surgery

## 2019-11-30 ENCOUNTER — Ambulatory Visit (INDEPENDENT_AMBULATORY_CARE_PROVIDER_SITE_OTHER): Payer: Medicare Other | Admitting: Orthopaedic Surgery

## 2019-11-30 ENCOUNTER — Other Ambulatory Visit: Payer: Self-pay

## 2019-11-30 DIAGNOSIS — G8929 Other chronic pain: Secondary | ICD-10-CM

## 2019-11-30 DIAGNOSIS — M21372 Foot drop, left foot: Secondary | ICD-10-CM

## 2019-11-30 DIAGNOSIS — M25561 Pain in right knee: Secondary | ICD-10-CM

## 2019-11-30 DIAGNOSIS — E1042 Type 1 diabetes mellitus with diabetic polyneuropathy: Secondary | ICD-10-CM

## 2019-11-30 NOTE — Progress Notes (Signed)
Patient ID:Steve [name], male DOB:March 13, 1936, 84 y.o. QMG:[id number]  No chief complaint on file.   HPI  Steve [name] is a 84 y.o. male who has diabetic peripherial neuropathy and left foot drop.  He is not wearing his AFO today.  He said it was too cold to wear it.  He is using his cane.  He has no new trauma.  He has pain in both knees with more pain in the left knee and swelling.   There is no height or weight on file to calculate BMI.  ROS  Review of Systems  Constitutional: Positive for activity change.  Musculoskeletal: Positive for arthralgias and gait problem.  All other systems reviewed and are negative.   All other systems reviewed and are negative.  The following is a summary of the past history medically, past history surgically, known current medicines, social history and family history.  This information is gathered electronically by the computer from prior information and documentation.  I review this each visit and have found including this information at this point in the chart is beneficial and informative.    Past Medical History:  Diagnosis Date  . Arthritis   . CKD (chronic kidney disease) stage 4, GFR 15-29 ml/min (HCC)   . Essential hypertension   . Hyperlipidemia   . Prostate cancer ([place])   . Renal insufficiency   . Type 2 diabetes mellitus ([place])     Past Surgical History:  Procedure Laterality Date  . CATARACT EXTRACTION W/PHACO Right 04/28/2016   Procedure: CATARACT EXTRACTION PHACO AND INTRAOCULAR LENS PLACEMENT (IOC);  Surgeon: Williams Che, MD;  Location: [facility];  Service: Ophthalmology;  Laterality: Right;  CDE: 4.28  . COLONOSCOPY     about 2 years in [place]   . CYSTECTOMY      Family History  Problem Relation Age of Onset  . Heart disease Mother        cause of death  . Diabetes Sister   . Colon cancer Neg Hx     Social History Social History   Tobacco Use  . Smoking status: Former Smoker    Packs/day: 2.00    Years: 25.00     Pack years: 50.00    Types: Pipe, Cigars    Start date: 11/08/1954    Quit date: 10/14/1987    Years since quitting: 32.1  . Smokeless tobacco: Never Used  . Tobacco comment: About 2 pipes a day  Substance Use Topics  . Alcohol use: No    Alcohol/week: 0.0 standard drinks  . Drug use: No    No Known Allergies  Current Outpatient Medications  Medication Sig Dispense Refill  . albuterol (PROVENTIL HFA;VENTOLIN HFA) 108 (90 Base) MCG/ACT inhaler Inhale 1-2 puffs into the lungs every 6 (six) hours as needed for wheezing or shortness of breath.    . Ascorbic Acid (VITAMIN C) 1000 MG tablet Take by mouth.    Marland Kitchen aspirin 81 MG tablet Take 81 mg by mouth daily.    . Cholecalciferol (VITAMIN D-3) 1000 UNITS CAPS Take 1 capsule by mouth once a week.     . docusate sodium (COLACE) 100 MG capsule Take 100 mg by mouth daily.     . fluticasone (FLONASE) 50 MCG/ACT nasal spray     . furosemide (LASIX) 20 MG tablet Take 1 tablet (20 mg total) by mouth daily for 5 days. 5 tablet 0  . gabapentin (NEURONTIN) 300 MG capsule Take 1 capsule by mouth 2 (two) times daily.    Marland Kitchen  hydrALAZINE (APRESOLINE) 50 MG tablet Take 50 mg by mouth 3 (three) times daily.    Marland Kitchen linagliptin (TRADJENTA) 5 MG TABS tablet Take 5 mg by mouth daily.    Marland Kitchen Lysine 500 MG CAPS Take 500 mg by mouth daily.    . metoprolol tartrate (LOPRESSOR) 25 MG tablet Take 0.5 tablets (12.5 mg total) by mouth 2 (two) times daily. 90 tablet 3  . NIFEdipine (PROCARDIA XL/ADALAT-CC) 90 MG 24 hr tablet TAKE (1) TABLET BY MOUTH ONCE DAILY. 30 tablet 6  . omeprazole (PRILOSEC) 20 MG capsule Take 1 capsule by mouth 2 (two) times daily.    . pravastatin (PRAVACHOL) 40 MG tablet Take 1 tablet by mouth every evening.     . sodium bicarbonate 650 MG tablet Take by mouth.    . tamsulosin (FLOMAX) 0.4 MG CAPS Take 1 capsule by mouth daily.     . traMADol (ULTRAM) 50 MG tablet Take 50 mg by mouth 2 (two) times daily as needed.    . valACYclovir (VALTREX) 1000  MG tablet Take 1 tablet (1,000 mg total) by mouth 3 (three) times daily. 21 tablet 0  . zolpidem (AMBIEN) 5 MG tablet      No current facility-administered medications for this visit.     Physical Exam  There were no vitals taken for this visit.  Constitutional: overall normal hygiene, normal nutrition, well developed, normal grooming, normal body habitus. Assistive device: cane  Musculoskeletal: gait and station Limp left, muscle tone and strength are abnormal left lower leg with left foot drop, no tremors or atrophy is present.  .  Neurological: coordination overall normal but he has a left foot drop.  Deep tendon reflex/nerve stretch intact.  Sensation normal.  Cranial nerves II-XII intact.   Skin:   Normal overall no scars, lesions, ulcers or rashes. No psoriasis.  Psychiatric: Alert and oriented x 3.  Recent memory intact, remote memory unclear.  Normal mood and affect. Well groomed.  Good eye contact.  Cardiovascular: overall no swelling, no varicosities, no edema bilaterally, normal temperatures of the legs and arms, no clubbing, cyanosis and good capillary refill.  Lymphatic: palpation is normal.  Both knees are tender, slight effusion, crepitus, ROM left 0-100, right 0-110.  Limp left more from foot drop.  Uses cane.  All other systems reviewed and are negative   The patient has been educated about the nature of the problem(s) and counseled on treatment options.  The patient appeared to understand what I have discussed and is in agreement with it.  Encounter Diagnoses  Name Primary?  . Chronic pain of right knee Yes  . Diabetic peripheral neuropathy associated with type 1 diabetes mellitus ([place])   . Left foot drop     PLAN Call if any problems.  Precautions discussed.  Continue current medications.   Return to clinic as needed.   I have encouraged him to use the AFO daily.  Electronically Signed Sanjuana Kava, MD 2/17/20219:45 AM

## 2019-12-14 DIAGNOSIS — N189 Chronic kidney disease, unspecified: Secondary | ICD-10-CM | POA: Diagnosis not present

## 2019-12-14 DIAGNOSIS — R809 Proteinuria, unspecified: Secondary | ICD-10-CM | POA: Diagnosis not present

## 2019-12-14 DIAGNOSIS — E1122 Type 2 diabetes mellitus with diabetic chronic kidney disease: Secondary | ICD-10-CM | POA: Diagnosis not present

## 2019-12-14 DIAGNOSIS — D631 Anemia in chronic kidney disease: Secondary | ICD-10-CM | POA: Diagnosis not present

## 2019-12-14 DIAGNOSIS — E1129 Type 2 diabetes mellitus with other diabetic kidney complication: Secondary | ICD-10-CM | POA: Diagnosis not present

## 2019-12-16 ENCOUNTER — Encounter (HOSPITAL_COMMUNITY): Payer: Self-pay

## 2019-12-16 ENCOUNTER — Emergency Department (HOSPITAL_COMMUNITY): Payer: Medicare Other

## 2019-12-16 ENCOUNTER — Other Ambulatory Visit: Payer: Self-pay

## 2019-12-16 ENCOUNTER — Emergency Department (HOSPITAL_COMMUNITY)
Admission: EM | Admit: 2019-12-16 | Discharge: 2019-12-16 | Disposition: A | Discharge status: Home / Self Care | Payer: Medicare Other | Attending: Emergency Medicine | Admitting: Emergency Medicine

## 2019-12-16 DIAGNOSIS — N184 Chronic kidney disease, stage 4 (severe): Secondary | ICD-10-CM | POA: Diagnosis not present

## 2019-12-16 DIAGNOSIS — Z79899 Other long term (current) drug therapy: Secondary | ICD-10-CM | POA: Insufficient documentation

## 2019-12-16 DIAGNOSIS — M25552 Pain in left hip: Secondary | ICD-10-CM | POA: Diagnosis not present

## 2019-12-16 DIAGNOSIS — I129 Hypertensive chronic kidney disease with stage 1 through stage 4 chronic kidney disease, or unspecified chronic kidney disease: Secondary | ICD-10-CM | POA: Insufficient documentation

## 2019-12-16 DIAGNOSIS — Z7984 Long term (current) use of oral hypoglycemic drugs: Secondary | ICD-10-CM | POA: Diagnosis not present

## 2019-12-16 DIAGNOSIS — E1122 Type 2 diabetes mellitus with diabetic chronic kidney disease: Secondary | ICD-10-CM | POA: Diagnosis not present

## 2019-12-16 DIAGNOSIS — Z87891 Personal history of nicotine dependence: Secondary | ICD-10-CM | POA: Diagnosis not present

## 2019-12-16 DIAGNOSIS — Z7982 Long term (current) use of aspirin: Secondary | ICD-10-CM | POA: Insufficient documentation

## 2019-12-16 MED ORDER — NAPROXEN 375 MG PO TABS: 375.0000 mg | ORAL_TABLET | Freq: Two times a day (BID) | ORAL | 0 refills | Status: DC

## 2019-12-16 MED ORDER — PREDNISONE 5 MG PO TABS: 5.0000 mg | ORAL_TABLET | Freq: Every day | ORAL | 0 refills | Status: DC

## 2019-12-16 NOTE — Discharge Instructions (Addendum)
Prescription for prednisone and pain medicine sent to your pharmacy.  Recommend ice pack.  Follow-up with your primary care doctor.

## 2019-12-16 NOTE — ED Triage Notes (Signed)
Pt reports left hip pain for aprox week. No known injury or fall. Worse with ambulation

## 2019-12-16 NOTE — ED Provider Notes (Signed)
[facility] EMERGENCY DEPARTMENT Provider Note   CSN: [id number] Arrival date & time: 12/16/19  5625     History Chief Complaint  Patient presents with  . Hip Pain    Steve [name] is a 84 y.o. male.  Chief complaint left hip pain for the past 24 hours.  No trauma or falling.  No radicular component or obvious low back pain.  Severity is moderate.  Ambulation makes pain worse.        Past Medical History:  Diagnosis Date  . Arthritis   . CKD (chronic kidney disease) stage 4, GFR 15-29 ml/min (HCC)   . Essential hypertension   . Hyperlipidemia   . Prostate cancer ([place])   . Renal insufficiency   . Type 2 diabetes mellitus [facility])     Patient Active Problem List   Diagnosis Date Noted  . Fall at home, initial encounter 07/26/2018  . Acute left-sided weakness 07/25/2018  . Hyperlipidemia 07/25/2018  . CKD (chronic kidney disease), stage IV ([place]) 05/13/2018  . Chronic kidney disease, stage 4 (severe) ([place]) 05/13/2018  . Chest pain 05/12/2018  . Elevated troponin 07/02/2017  . DM (diabetes mellitus), type 2 with renal complications ([place]) 63/89/3734  . IBS (irritable bowel syndrome) 01/28/2017  . Prostate cancer ([place]) 07/10/2014  . Abnormal ECG 04/12/2013  . HTN (hypertension) 04/12/2013  . SOB (shortness of breath) 04/12/2013  . Type 2 diabetes mellitus with diabetic nephropathy ([place]) 01/11/2013  . CHEST PAIN-UNSPECIFIED 06/12/2009    Past Surgical History:  Procedure Laterality Date  . CATARACT EXTRACTION W/PHACO Right 04/28/2016   Procedure: CATARACT EXTRACTION PHACO AND INTRAOCULAR LENS PLACEMENT (IOC);  Surgeon: Williams Che, MD;  Location: [facility];  Service: Ophthalmology;  Laterality: Right;  CDE: 4.28  . COLONOSCOPY     about 2 years in [place]   . CYSTECTOMY         Family History  Problem Relation Age of Onset  . Heart disease Mother        cause of death  . Diabetes Sister   . Colon cancer Neg Hx     Social History   Tobacco Use  . Smoking  status: Former Smoker    Packs/day: 2.00    Years: 25.00    Pack years: 50.00    Types: Pipe, Cigars    Start date: 11/08/1954    Quit date: 10/14/1987    Years since quitting: 32.1  . Smokeless tobacco: Never Used  . Tobacco comment: About 2 pipes a day  Substance Use Topics  . Alcohol use: No    Alcohol/week: 0.0 standard drinks  . Drug use: No    Home Medications Prior to Admission medications   Medication Sig Start Date End Date Taking? Authorizing Provider  albuterol (PROVENTIL HFA;VENTOLIN HFA) 108 (90 Base) MCG/ACT inhaler Inhale 1-2 puffs into the lungs every 6 (six) hours as needed for wheezing or shortness of breath.    [provider]  Ascorbic Acid (VITAMIN C) 1000 MG tablet Take by mouth.    [provider]  aspirin 81 MG tablet Take 81 mg by mouth daily.    [provider]  Cholecalciferol (VITAMIN D-3) 1000 UNITS CAPS Take 1 capsule by mouth once a week.     [provider]  docusate sodium (COLACE) 100 MG capsule Take 100 mg by mouth daily.     [provider]  fluticasone Asencion Islam) 50 MCG/ACT nasal spray  01/14/19   [provider]  furosemide (LASIX) 20 MG  tablet Take 1 tablet (20 mg total) by mouth daily for 5 days. 11/14/18 06/07/19  Noemi Chapel, MD  gabapentin (NEURONTIN) 300 MG capsule Take 1 capsule by mouth 2 (two) times daily. 01/19/15   [provider]  hydrALAZINE (APRESOLINE) 50 MG tablet Take 50 mg by mouth 3 (three) times daily.    [provider]  linagliptin (TRADJENTA) 5 MG TABS tablet Take 5 mg by mouth daily.    [provider]  Lysine 500 MG CAPS Take 500 mg by mouth daily.    [provider]  metoprolol tartrate (LOPRESSOR) 25 MG tablet Take 0.5 tablets (12.5 mg total) by mouth 2 (two) times daily. 06/16/19   Herminio Commons, MD  NIFEdipine (PROCARDIA XL/ADALAT-CC) 90 MG 24 hr tablet TAKE (1) TABLET BY MOUTH ONCE DAILY. 06/17/18   Herminio Commons, MD  omeprazole  (PRILOSEC) 20 MG capsule Take 1 capsule by mouth 2 (two) times daily. 04/06/13   [provider]  pravastatin (PRAVACHOL) 40 MG tablet Take 1 tablet by mouth every evening.  04/06/13   [provider]  sodium bicarbonate 650 MG tablet Take by mouth. 06/24/19   [provider]  tamsulosin (FLOMAX) 0.4 MG CAPS Take 1 capsule by mouth daily.  04/06/13   [provider]  traMADol (ULTRAM) 50 MG tablet Take 50 mg by mouth 2 (two) times daily as needed. 10/26/19   [provider]  valACYclovir (VALTREX) 1000 MG tablet Take 1 tablet (1,000 mg total) by mouth 3 (three) times daily. 04/09/19   Milton Ferguson, MD  zolpidem Lorrin Mais) 5 MG tablet  07/11/19   [provider]    Allergies    Patient has no known allergies.  Review of Systems   Review of Systems  All other systems reviewed and are negative.   Physical Exam Updated Vital Signs BP 138/69 (BP Location: Right Arm)   Pulse 76   Resp 16   Wt 80.7 kg   SpO2 99%   BMI 29.62 kg/m   Physical Exam Vitals and nursing note reviewed.  Constitutional:      Appearance: He is well-developed.  HENT:     Head: Normocephalic and atraumatic.  Eyes:     Conjunctiva/sclera: Conjunctivae normal.  Cardiovascular:     Rate and Rhythm: Normal rate and regular rhythm.  Pulmonary:     Effort: Pulmonary effort is normal.     Breath sounds: Normal breath sounds.  Abdominal:     General: Bowel sounds are normal.     Palpations: Abdomen is soft.  Musculoskeletal:     Cervical back: Neck supple.     Comments: Left hip: Most tender in the superior posterior lateral aspect of the hip.  No obvious sciatica nerve tenderness.  Pain with straight leg raise.  Skin:    General: Skin is warm and dry.  Neurological:     General: No focal deficit present.     Mental Status: He is alert and oriented to person, place, and time.  Psychiatric:        Behavior: Behavior normal.     ED Results / Procedures /  Treatments   Labs (all labs ordered are listed, but only abnormal results are displayed) Labs Reviewed - No data to display  EKG None  Radiology MR HIP LEFT WO CONTRAST  Result Date: 12/16/2019 CLINICAL DATA:  Left hip pain for 1 week.  No known injury. EXAM: MR OF THE LEFT HIP WITHOUT CONTRAST TECHNIQUE: Multiplanar, multisequence MR imaging  was performed. No intravenous contrast was administered. COMPARISON:  Plain films left hip earlier today. FINDINGS: Bones: There is no acute bony or joint abnormality. No avascular necrosis of the femoral heads or other focal bone lesion. No subchondral cyst formation or edema about the hips. Articular cartilage and labrum Articular cartilage:  Preserved. Labrum:  Intact. Joint or bursal effusion Joint effusion:  None. Bursae: Negative. Muscles and tendons Muscles and tendons:  Intact and normal in appearance. Other findings Miscellaneous: Imaged intrapelvic contents demonstrate prostatomegaly. IMPRESSION: Normal appearing left hip. No finding to explain the patient's pain. Prostatomegaly. Electronically Signed   By: Inge Rise M.D.   On: 12/16/2019 13:17   DG Hip Unilat W or Wo Pelvis 2-3 Views Left  Result Date: 12/16/2019 CLINICAL DATA:  Left hip pain EXAM: DG HIP (WITH OR WITHOUT PELVIS) 2-3V LEFT COMPARISON:  None. FINDINGS: Negative for fracture. Mild sclerotic changes in the superior femoral head which could represent avascular necrosis. Normal joint space. No pelvic lesion Surgical clips in the region of prostate IMPRESSION: Negative for fracture. Possible AVN left femoral head. MRI hip recommended. Electronically Signed   By: Franchot Gallo M.D.   On: 12/16/2019 11:05    Procedures Procedures (including critical care time)  Medications Ordered in ED Medications - No data to display  ED Course  I have reviewed the triage vital signs and the nursing notes.  Pertinent labs & imaging results that were available during my care of the patient  were reviewed by me and considered in my medical decision making (see chart for details).    MDM Rules/Calculators/A&P                      We will obtain plain films of the hip.  1200: Radiologist recommends MRI of hip.  Will order same. Final Clinical Impression(s) / ED Diagnoses Final diagnoses:  Left hip pain    Rx / DC Orders ED Discharge Orders    None       Nat Christen, MD 12/16/19 1325

## 2019-12-16 NOTE — ED Notes (Signed)
In MRI

## 2019-12-16 NOTE — ED Notes (Signed)
Pt reports a history of arthritis  Reports pain worse at night Improves during the day when he moves   Has seen Dr Luna Glasgow in the past

## 2019-12-21 DIAGNOSIS — R809 Proteinuria, unspecified: Secondary | ICD-10-CM | POA: Diagnosis not present

## 2019-12-21 DIAGNOSIS — N185 Chronic kidney disease, stage 5: Secondary | ICD-10-CM | POA: Diagnosis not present

## 2019-12-21 DIAGNOSIS — J42 Unspecified chronic bronchitis: Secondary | ICD-10-CM | POA: Diagnosis not present

## 2019-12-21 DIAGNOSIS — I129 Hypertensive chronic kidney disease with stage 1 through stage 4 chronic kidney disease, or unspecified chronic kidney disease: Secondary | ICD-10-CM | POA: Diagnosis not present

## 2019-12-21 DIAGNOSIS — E211 Secondary hyperparathyroidism, not elsewhere classified: Secondary | ICD-10-CM | POA: Diagnosis not present

## 2019-12-21 DIAGNOSIS — R0602 Shortness of breath: Secondary | ICD-10-CM | POA: Diagnosis not present

## 2019-12-21 DIAGNOSIS — D631 Anemia in chronic kidney disease: Secondary | ICD-10-CM | POA: Diagnosis not present

## 2019-12-25 ENCOUNTER — Emergency Department (HOSPITAL_COMMUNITY)
Admission: EM | Admit: 2019-12-25 | Discharge: 2019-12-25 | Disposition: A | Discharge status: Home / Self Care | Payer: Medicare Other | Attending: Emergency Medicine | Admitting: Emergency Medicine

## 2019-12-25 ENCOUNTER — Other Ambulatory Visit: Payer: Self-pay

## 2019-12-25 ENCOUNTER — Encounter (HOSPITAL_COMMUNITY): Payer: Self-pay | Admitting: Emergency Medicine

## 2019-12-25 ENCOUNTER — Emergency Department (HOSPITAL_COMMUNITY): Payer: Medicare Other

## 2019-12-25 DIAGNOSIS — E1122 Type 2 diabetes mellitus with diabetic chronic kidney disease: Secondary | ICD-10-CM | POA: Insufficient documentation

## 2019-12-25 DIAGNOSIS — Z87891 Personal history of nicotine dependence: Secondary | ICD-10-CM | POA: Diagnosis not present

## 2019-12-25 DIAGNOSIS — Z79899 Other long term (current) drug therapy: Secondary | ICD-10-CM | POA: Diagnosis not present

## 2019-12-25 DIAGNOSIS — N184 Chronic kidney disease, stage 4 (severe): Secondary | ICD-10-CM | POA: Diagnosis not present

## 2019-12-25 DIAGNOSIS — M5432 Sciatica, left side: Secondary | ICD-10-CM

## 2019-12-25 DIAGNOSIS — Z7984 Long term (current) use of oral hypoglycemic drugs: Secondary | ICD-10-CM | POA: Diagnosis not present

## 2019-12-25 DIAGNOSIS — Z7982 Long term (current) use of aspirin: Secondary | ICD-10-CM | POA: Diagnosis not present

## 2019-12-25 DIAGNOSIS — M5442 Lumbago with sciatica, left side: Secondary | ICD-10-CM | POA: Insufficient documentation

## 2019-12-25 DIAGNOSIS — M545 Low back pain: Secondary | ICD-10-CM | POA: Diagnosis not present

## 2019-12-25 DIAGNOSIS — I129 Hypertensive chronic kidney disease with stage 1 through stage 4 chronic kidney disease, or unspecified chronic kidney disease: Secondary | ICD-10-CM | POA: Diagnosis not present

## 2019-12-25 MED ORDER — PREDNISONE 10 MG PO TABS: 20.0000 mg | ORAL_TABLET | Freq: Two times a day (BID) | ORAL | 0 refills | Status: DC

## 2019-12-25 MED ORDER — HYDROCODONE-ACETAMINOPHEN 5-325 MG PO TABS
2.0000 | ORAL_TABLET | Freq: Once | ORAL | Status: AC
  Administered 2019-12-25: 2 via ORAL
  Filled 2019-12-25: qty 2

## 2019-12-25 MED ORDER — PREDNISONE 20 MG PO TABS
20.0000 mg | ORAL_TABLET | Freq: Once | ORAL | Status: AC
  Administered 2019-12-25: 20 mg via ORAL
  Filled 2019-12-25: qty 1

## 2019-12-25 MED ORDER — HYDROCODONE-ACETAMINOPHEN 5-325 MG PO TABS: 1.0000 | ORAL_TABLET | Freq: Four times a day (QID) | ORAL | 0 refills | Status: DC | PRN

## 2019-12-25 NOTE — ED Provider Notes (Signed)
[facility] EMERGENCY DEPARTMENT Provider Note   CSN: [id number] Arrival date & time: 12/25/19  1054     History Chief Complaint  Patient presents with  . Hip Pain    Steve [name] is a 84 y.o. male.  Patient is an 84 year old male with past medical history of hypertension, prostate cancer, chronic renal insufficiency, diabetes.  He presents today for complaints of pain in his left lower back and buttock.  This is been present for approximately 2 weeks.  He was seen here 9 days ago and underwent MRI of the hip which was unremarkable for fracture or other abnormality.  He returns today with ongoing pain in this area.  He also feels like something "popped" several days ago.  He denies any specific injury or trauma.  The history is provided by the patient.  Hip Pain This is a new problem. Episode onset: 2 weeks ago. The problem occurs constantly. The problem has been gradually worsening. The symptoms are aggravated by walking. Nothing relieves the symptoms.       Past Medical History:  Diagnosis Date  . Arthritis   . CKD (chronic kidney disease) stage 4, GFR 15-29 ml/min (HCC)   . Essential hypertension   . Hyperlipidemia   . Prostate cancer ([place])   . Renal insufficiency   . Type 2 diabetes mellitus [facility])     Patient Active Problem List   Diagnosis Date Noted  . Fall at home, initial encounter 07/26/2018  . Acute left-sided weakness 07/25/2018  . Hyperlipidemia 07/25/2018  . CKD (chronic kidney disease), stage IV ([place]) 05/13/2018  . Chronic kidney disease, stage 4 (severe) ([place]) 05/13/2018  . Chest pain 05/12/2018  . Elevated troponin 07/02/2017  . DM (diabetes mellitus), type 2 with renal complications ([place]) 79/11/4095  . IBS (irritable bowel syndrome) 01/28/2017  . Prostate cancer ([place]) 07/10/2014  . Abnormal ECG 04/12/2013  . HTN (hypertension) 04/12/2013  . SOB (shortness of breath) 04/12/2013  . Type 2 diabetes mellitus with diabetic nephropathy ([place]) 01/11/2013    . CHEST PAIN-UNSPECIFIED 06/12/2009    Past Surgical History:  Procedure Laterality Date  . CATARACT EXTRACTION W/PHACO Right 04/28/2016   Procedure: CATARACT EXTRACTION PHACO AND INTRAOCULAR LENS PLACEMENT (IOC);  Surgeon: Williams Che, MD;  Location: [facility];  Service: Ophthalmology;  Laterality: Right;  CDE: 4.28  . COLONOSCOPY     about 2 years in [place]   . CYSTECTOMY         Family History  Problem Relation Age of Onset  . Heart disease Mother        cause of death  . Diabetes Sister   . Colon cancer Neg Hx     Social History   Tobacco Use  . Smoking status: Former Smoker    Packs/day: 2.00    Years: 25.00    Pack years: 50.00    Types: Pipe, Cigars    Start date: 11/08/1954    Quit date: 10/14/1987    Years since quitting: 32.2  . Smokeless tobacco: Never Used  . Tobacco comment: About 2 pipes a day  Substance Use Topics  . Alcohol use: No    Alcohol/week: 0.0 standard drinks  . Drug use: No    Home Medications Prior to Admission medications   Medication Sig Start Date End Date Taking? Authorizing Provider  albuterol (PROVENTIL HFA;VENTOLIN HFA) 108 (90 Base) MCG/ACT inhaler Inhale 1-2 puffs into the lungs every 6 (six) hours as needed for wheezing or shortness of breath.  [provider]  Ascorbic Acid (VITAMIN C) 1000 MG tablet Take by mouth.    [provider]  aspirin 81 MG tablet Take 81 mg by mouth daily.    [provider]  Cholecalciferol (VITAMIN D-3) 1000 UNITS CAPS Take 1 capsule by mouth once a week.     [provider]  docusate sodium (COLACE) 100 MG capsule Take 100 mg by mouth daily.     [provider]  fluticasone Asencion Islam) 50 MCG/ACT nasal spray  01/14/19   [provider]  furosemide (LASIX) 20 MG tablet Take 1 tablet (20 mg total) by mouth daily for 5 days. 11/14/18 06/07/19  Noemi Chapel, MD  gabapentin (NEURONTIN) 300 MG capsule Take 1 capsule by mouth 2 (two) times daily. 01/19/15    [provider]  hydrALAZINE (APRESOLINE) 50 MG tablet Take 50 mg by mouth 3 (three) times daily.    [provider]  linagliptin (TRADJENTA) 5 MG TABS tablet Take 5 mg by mouth daily.    [provider]  Lysine 500 MG CAPS Take 500 mg by mouth daily.    [provider]  metoprolol tartrate (LOPRESSOR) 25 MG tablet Take 0.5 tablets (12.5 mg total) by mouth 2 (two) times daily. 06/16/19   Herminio Commons, MD  naproxen (NAPROSYN) 375 MG tablet Take 1 tablet (375 mg total) by mouth 2 (two) times daily. 12/16/19   Nat Christen, MD  NIFEdipine (PROCARDIA XL/ADALAT-CC) 90 MG 24 hr tablet TAKE (1) TABLET BY MOUTH ONCE DAILY. 06/17/18   Herminio Commons, MD  omeprazole (PRILOSEC) 20 MG capsule Take 1 capsule by mouth 2 (two) times daily. 04/06/13   [provider]  pravastatin (PRAVACHOL) 40 MG tablet Take 1 tablet by mouth every evening.  04/06/13   [provider]  predniSONE (DELTASONE) 5 MG tablet Take 1 tablet (5 mg total) by mouth daily with breakfast. 12/16/19   Nat Christen, MD  sodium bicarbonate 650 MG tablet Take by mouth. 06/24/19   [provider]  tamsulosin (FLOMAX) 0.4 MG CAPS Take 1 capsule by mouth daily.  04/06/13   [provider]  traMADol (ULTRAM) 50 MG tablet Take 50 mg by mouth 2 (two) times daily as needed. 10/26/19   [provider]  valACYclovir (VALTREX) 1000 MG tablet Take 1 tablet (1,000 mg total) by mouth 3 (three) times daily. 04/09/19   Milton Ferguson, MD  zolpidem Lorrin Mais) 5 MG tablet  07/11/19   [provider]    Allergies    Patient has no known allergies.  Review of Systems   Review of Systems  All other systems reviewed and are negative.   Physical Exam Updated Vital Signs BP (!) 145/59 (BP Location: Right Arm)   Pulse 65   Temp 98.2 F (36.8 C) (Oral)   Resp 16   Ht 5\' 5"  (1.651 m)   Wt 80.7 kg   SpO2 100%   BMI 29.62 kg/m   Physical Exam Vitals and nursing note  reviewed.  Constitutional:      General: He is not in acute distress.    Appearance: He is well-developed. He is not diaphoretic.  HENT:     Head: Normocephalic and atraumatic.  Cardiovascular:     Heart sounds: No murmur. No friction rub.  Pulmonary:     Effort: Pulmonary effort is normal.  Musculoskeletal:        General: Normal range of motion.     Cervical back: Normal range of motion  and neck supple.     Comments: There is tenderness in the left lower lumbar region and buttock.  Skin:    General: Skin is warm and dry.  Neurological:     Mental Status: He is alert and oriented to person, place, and time.     Coordination: Coordination normal.     Comments: Strength is 5 out of 5 in both lower extremities.  DTRs are 1+ and symmetrical in both lower extremities.     ED Results / Procedures / Treatments   Labs (all labs ordered are listed, but only abnormal results are displayed) Labs Reviewed - No data to display  EKG None  Radiology No results found.  Procedures Procedures (including critical care time)  Medications Ordered in ED Medications - No data to display  ED Course  I have reviewed the triage vital signs and the nursing notes.  Pertinent labs & imaging results that were available during my care of the patient were reviewed by me and considered in my medical decision making (see chart for details).    MDM Rules/Calculators/A&P  Patient presents with complaints of pain in his buttock and low back that I suspect is related to sciatica.  He had his hip imaged with MRI showing no acute abnormality.  To my exam, it appears as though patient is more tender in the lower lumbar region and buttock.  CT scan was obtained of the lumbar spine today to rule out compression fracture.  This did not show a compression fracture, but did show multilevel disc disease.  Patient will be treated with prednisone and pain medicine.  He is to follow-up with primary doctor if not  improving.  Final Clinical Impression(s) / ED Diagnoses Final diagnoses:  None    Rx / DC Orders ED Discharge Orders    None       Veryl Speak, MD 12/25/19 1328

## 2019-12-25 NOTE — ED Triage Notes (Signed)
Patient complains of left hip pain. Was seen on 12/16/19 for the same with a negative xray and MRI.

## 2019-12-26 DIAGNOSIS — I10 Essential (primary) hypertension: Secondary | ICD-10-CM | POA: Diagnosis not present

## 2019-12-26 DIAGNOSIS — K219 Gastro-esophageal reflux disease without esophagitis: Secondary | ICD-10-CM | POA: Diagnosis not present

## 2019-12-26 DIAGNOSIS — E11649 Type 2 diabetes mellitus with hypoglycemia without coma: Secondary | ICD-10-CM | POA: Diagnosis not present

## 2019-12-30 DIAGNOSIS — N185 Chronic kidney disease, stage 5: Secondary | ICD-10-CM | POA: Diagnosis not present

## 2019-12-30 DIAGNOSIS — E1122 Type 2 diabetes mellitus with diabetic chronic kidney disease: Secondary | ICD-10-CM | POA: Diagnosis not present

## 2019-12-30 DIAGNOSIS — E1129 Type 2 diabetes mellitus with other diabetic kidney complication: Secondary | ICD-10-CM | POA: Diagnosis not present

## 2019-12-30 DIAGNOSIS — R809 Proteinuria, unspecified: Secondary | ICD-10-CM | POA: Diagnosis not present

## 2019-12-30 DIAGNOSIS — D631 Anemia in chronic kidney disease: Secondary | ICD-10-CM | POA: Diagnosis not present

## 2020-01-03 DIAGNOSIS — E1142 Type 2 diabetes mellitus with diabetic polyneuropathy: Secondary | ICD-10-CM | POA: Diagnosis not present

## 2020-01-03 DIAGNOSIS — N184 Chronic kidney disease, stage 4 (severe): Secondary | ICD-10-CM | POA: Diagnosis not present

## 2020-01-03 DIAGNOSIS — M5440 Lumbago with sciatica, unspecified side: Secondary | ICD-10-CM | POA: Diagnosis not present

## 2020-01-04 DIAGNOSIS — N185 Chronic kidney disease, stage 5: Secondary | ICD-10-CM | POA: Diagnosis not present

## 2020-01-04 DIAGNOSIS — E211 Secondary hyperparathyroidism, not elsewhere classified: Secondary | ICD-10-CM | POA: Diagnosis not present

## 2020-01-04 DIAGNOSIS — I129 Hypertensive chronic kidney disease with stage 1 through stage 4 chronic kidney disease, or unspecified chronic kidney disease: Secondary | ICD-10-CM | POA: Diagnosis not present

## 2020-01-04 DIAGNOSIS — R809 Proteinuria, unspecified: Secondary | ICD-10-CM | POA: Diagnosis not present

## 2020-01-04 DIAGNOSIS — D631 Anemia in chronic kidney disease: Secondary | ICD-10-CM | POA: Diagnosis not present

## 2020-01-05 DIAGNOSIS — D649 Anemia, unspecified: Secondary | ICD-10-CM | POA: Diagnosis not present

## 2020-01-05 DIAGNOSIS — K5730 Diverticulosis of large intestine without perforation or abscess without bleeding: Secondary | ICD-10-CM | POA: Diagnosis not present

## 2020-01-05 DIAGNOSIS — I10 Essential (primary) hypertension: Secondary | ICD-10-CM | POA: Diagnosis not present

## 2020-01-05 DIAGNOSIS — Z743 Need for continuous supervision: Secondary | ICD-10-CM | POA: Diagnosis not present

## 2020-01-05 DIAGNOSIS — E872 Acidosis: Secondary | ICD-10-CM | POA: Diagnosis not present

## 2020-01-05 DIAGNOSIS — R140 Abdominal distension (gaseous): Secondary | ICD-10-CM | POA: Diagnosis not present

## 2020-01-05 DIAGNOSIS — E1140 Type 2 diabetes mellitus with diabetic neuropathy, unspecified: Secondary | ICD-10-CM | POA: Diagnosis not present

## 2020-01-05 DIAGNOSIS — E11649 Type 2 diabetes mellitus with hypoglycemia without coma: Secondary | ICD-10-CM | POA: Diagnosis not present

## 2020-01-05 DIAGNOSIS — R509 Fever, unspecified: Secondary | ICD-10-CM | POA: Diagnosis not present

## 2020-01-05 DIAGNOSIS — Z9282 Status post administration of tPA (rtPA) in a different facility within the last 24 hours prior to admission to current facility: Secondary | ICD-10-CM | POA: Diagnosis not present

## 2020-01-05 DIAGNOSIS — R791 Abnormal coagulation profile: Secondary | ICD-10-CM | POA: Diagnosis not present

## 2020-01-05 DIAGNOSIS — E1149 Type 2 diabetes mellitus with other diabetic neurological complication: Secondary | ICD-10-CM | POA: Diagnosis not present

## 2020-01-05 DIAGNOSIS — J189 Pneumonia, unspecified organism: Secondary | ICD-10-CM | POA: Diagnosis not present

## 2020-01-05 DIAGNOSIS — J9600 Acute respiratory failure, unspecified whether with hypoxia or hypercapnia: Secondary | ICD-10-CM | POA: Diagnosis not present

## 2020-01-05 DIAGNOSIS — Z79899 Other long term (current) drug therapy: Secondary | ICD-10-CM | POA: Diagnosis not present

## 2020-01-05 DIAGNOSIS — E1165 Type 2 diabetes mellitus with hyperglycemia: Secondary | ICD-10-CM | POA: Diagnosis not present

## 2020-01-05 DIAGNOSIS — I161 Hypertensive emergency: Secondary | ICD-10-CM | POA: Diagnosis not present

## 2020-01-05 DIAGNOSIS — R930 Abnormal findings on diagnostic imaging of skull and head, not elsewhere classified: Secondary | ICD-10-CM | POA: Diagnosis not present

## 2020-01-05 DIAGNOSIS — I517 Cardiomegaly: Secondary | ICD-10-CM | POA: Diagnosis not present

## 2020-01-05 DIAGNOSIS — E875 Hyperkalemia: Secondary | ICD-10-CM | POA: Diagnosis not present

## 2020-01-05 DIAGNOSIS — K449 Diaphragmatic hernia without obstruction or gangrene: Secondary | ICD-10-CM | POA: Diagnosis not present

## 2020-01-05 DIAGNOSIS — E871 Hypo-osmolality and hyponatremia: Secondary | ICD-10-CM | POA: Diagnosis not present

## 2020-01-05 DIAGNOSIS — M545 Low back pain: Secondary | ICD-10-CM | POA: Diagnosis not present

## 2020-01-05 DIAGNOSIS — J9601 Acute respiratory failure with hypoxia: Secondary | ICD-10-CM | POA: Diagnosis not present

## 2020-01-05 DIAGNOSIS — G8194 Hemiplegia, unspecified affecting left nondominant side: Secondary | ICD-10-CM | POA: Diagnosis not present

## 2020-01-05 DIAGNOSIS — N185 Chronic kidney disease, stage 5: Secondary | ICD-10-CM | POA: Diagnosis not present

## 2020-01-05 DIAGNOSIS — R569 Unspecified convulsions: Secondary | ICD-10-CM | POA: Diagnosis not present

## 2020-01-05 DIAGNOSIS — Z87891 Personal history of nicotine dependence: Secondary | ICD-10-CM | POA: Diagnosis not present

## 2020-01-05 DIAGNOSIS — I6782 Cerebral ischemia: Secondary | ICD-10-CM | POA: Diagnosis not present

## 2020-01-05 DIAGNOSIS — D638 Anemia in other chronic diseases classified elsewhere: Secondary | ICD-10-CM | POA: Diagnosis not present

## 2020-01-05 DIAGNOSIS — R109 Unspecified abdominal pain: Secondary | ICD-10-CM | POA: Diagnosis not present

## 2020-01-05 DIAGNOSIS — G9340 Encephalopathy, unspecified: Secondary | ICD-10-CM | POA: Diagnosis not present

## 2020-01-05 DIAGNOSIS — R410 Disorientation, unspecified: Secondary | ICD-10-CM | POA: Diagnosis not present

## 2020-01-05 DIAGNOSIS — D509 Iron deficiency anemia, unspecified: Secondary | ICD-10-CM | POA: Diagnosis not present

## 2020-01-05 DIAGNOSIS — I120 Hypertensive chronic kidney disease with stage 5 chronic kidney disease or end stage renal disease: Secondary | ICD-10-CM | POA: Diagnosis not present

## 2020-01-05 DIAGNOSIS — N3289 Other specified disorders of bladder: Secondary | ICD-10-CM | POA: Diagnosis not present

## 2020-01-05 DIAGNOSIS — E1122 Type 2 diabetes mellitus with diabetic chronic kidney disease: Secondary | ICD-10-CM | POA: Diagnosis not present

## 2020-01-05 DIAGNOSIS — R29810 Facial weakness: Secondary | ICD-10-CM | POA: Diagnosis not present

## 2020-01-05 DIAGNOSIS — G92 Toxic encephalopathy: Secondary | ICD-10-CM | POA: Diagnosis not present

## 2020-01-05 DIAGNOSIS — R079 Chest pain, unspecified: Secondary | ICD-10-CM | POA: Diagnosis not present

## 2020-01-05 DIAGNOSIS — G629 Polyneuropathy, unspecified: Secondary | ICD-10-CM | POA: Diagnosis not present

## 2020-01-05 DIAGNOSIS — Z7982 Long term (current) use of aspirin: Secondary | ICD-10-CM | POA: Diagnosis not present

## 2020-01-05 DIAGNOSIS — Z923 Personal history of irradiation: Secondary | ICD-10-CM | POA: Diagnosis not present

## 2020-01-05 DIAGNOSIS — R918 Other nonspecific abnormal finding of lung field: Secondary | ICD-10-CM | POA: Diagnosis not present

## 2020-01-05 DIAGNOSIS — E785 Hyperlipidemia, unspecified: Secondary | ICD-10-CM | POA: Diagnosis not present

## 2020-01-05 DIAGNOSIS — N281 Cyst of kidney, acquired: Secondary | ICD-10-CM | POA: Diagnosis not present

## 2020-01-05 DIAGNOSIS — E1142 Type 2 diabetes mellitus with diabetic polyneuropathy: Secondary | ICD-10-CM | POA: Diagnosis not present

## 2020-01-05 DIAGNOSIS — I639 Cerebral infarction, unspecified: Secondary | ICD-10-CM | POA: Diagnosis not present

## 2020-01-05 DIAGNOSIS — I089 Rheumatic multiple valve disease, unspecified: Secondary | ICD-10-CM | POA: Diagnosis not present

## 2020-01-05 DIAGNOSIS — E7800 Pure hypercholesterolemia, unspecified: Secondary | ICD-10-CM | POA: Diagnosis not present

## 2020-01-05 DIAGNOSIS — Z20822 Contact with and (suspected) exposure to covid-19: Secondary | ICD-10-CM | POA: Diagnosis not present

## 2020-01-05 DIAGNOSIS — R404 Transient alteration of awareness: Secondary | ICD-10-CM | POA: Diagnosis not present

## 2020-01-05 DIAGNOSIS — G8192 Hemiplegia, unspecified affecting left dominant side: Secondary | ICD-10-CM | POA: Diagnosis not present

## 2020-01-21 DIAGNOSIS — R0602 Shortness of breath: Secondary | ICD-10-CM | POA: Diagnosis not present

## 2020-01-21 DIAGNOSIS — J42 Unspecified chronic bronchitis: Secondary | ICD-10-CM | POA: Diagnosis not present

## 2020-01-23 DIAGNOSIS — B965 Pseudomonas (aeruginosa) (mallei) (pseudomallei) as the cause of diseases classified elsewhere: Secondary | ICD-10-CM | POA: Diagnosis not present

## 2020-01-23 DIAGNOSIS — Z466 Encounter for fitting and adjustment of urinary device: Secondary | ICD-10-CM | POA: Diagnosis not present

## 2020-01-23 DIAGNOSIS — E785 Hyperlipidemia, unspecified: Secondary | ICD-10-CM | POA: Diagnosis not present

## 2020-01-23 DIAGNOSIS — D509 Iron deficiency anemia, unspecified: Secondary | ICD-10-CM | POA: Diagnosis not present

## 2020-01-23 DIAGNOSIS — E1165 Type 2 diabetes mellitus with hyperglycemia: Secondary | ICD-10-CM | POA: Diagnosis not present

## 2020-01-23 DIAGNOSIS — M545 Low back pain: Secondary | ICD-10-CM | POA: Diagnosis not present

## 2020-01-23 DIAGNOSIS — I10 Essential (primary) hypertension: Secondary | ICD-10-CM | POA: Diagnosis not present

## 2020-01-23 DIAGNOSIS — N390 Urinary tract infection, site not specified: Secondary | ICD-10-CM | POA: Diagnosis not present

## 2020-01-23 DIAGNOSIS — K219 Gastro-esophageal reflux disease without esophagitis: Secondary | ICD-10-CM | POA: Diagnosis not present

## 2020-01-23 DIAGNOSIS — K5900 Constipation, unspecified: Secondary | ICD-10-CM | POA: Diagnosis not present

## 2020-01-23 DIAGNOSIS — R339 Retention of urine, unspecified: Secondary | ICD-10-CM | POA: Diagnosis not present

## 2020-01-23 DIAGNOSIS — N185 Chronic kidney disease, stage 5: Secondary | ICD-10-CM | POA: Diagnosis not present

## 2020-01-23 DIAGNOSIS — G92 Toxic encephalopathy: Secondary | ICD-10-CM | POA: Diagnosis not present

## 2020-01-23 DIAGNOSIS — D649 Anemia, unspecified: Secondary | ICD-10-CM | POA: Diagnosis not present

## 2020-01-23 DIAGNOSIS — R531 Weakness: Secondary | ICD-10-CM | POA: Diagnosis not present

## 2020-01-23 DIAGNOSIS — I120 Hypertensive chronic kidney disease with stage 5 chronic kidney disease or end stage renal disease: Secondary | ICD-10-CM | POA: Diagnosis not present

## 2020-01-23 DIAGNOSIS — R569 Unspecified convulsions: Secondary | ICD-10-CM | POA: Diagnosis not present

## 2020-01-23 DIAGNOSIS — J189 Pneumonia, unspecified organism: Secondary | ICD-10-CM | POA: Diagnosis not present

## 2020-01-23 DIAGNOSIS — E1140 Type 2 diabetes mellitus with diabetic neuropathy, unspecified: Secondary | ICD-10-CM | POA: Diagnosis not present

## 2020-02-04 ENCOUNTER — Emergency Department (HOSPITAL_COMMUNITY)
Admission: EM | Admit: 2020-02-04 | Discharge: 2020-02-04 | Disposition: A | Discharge status: Home / Self Care | Payer: Medicare Other | Attending: Emergency Medicine | Admitting: Emergency Medicine

## 2020-02-04 ENCOUNTER — Other Ambulatory Visit: Payer: Self-pay

## 2020-02-04 ENCOUNTER — Encounter (HOSPITAL_COMMUNITY): Payer: Self-pay

## 2020-02-04 DIAGNOSIS — Z978 Presence of other specified devices: Secondary | ICD-10-CM

## 2020-02-04 DIAGNOSIS — N184 Chronic kidney disease, stage 4 (severe): Secondary | ICD-10-CM | POA: Diagnosis not present

## 2020-02-04 DIAGNOSIS — R1030 Lower abdominal pain, unspecified: Secondary | ICD-10-CM | POA: Insufficient documentation

## 2020-02-04 DIAGNOSIS — I129 Hypertensive chronic kidney disease with stage 1 through stage 4 chronic kidney disease, or unspecified chronic kidney disease: Secondary | ICD-10-CM | POA: Insufficient documentation

## 2020-02-04 DIAGNOSIS — R339 Retention of urine, unspecified: Secondary | ICD-10-CM | POA: Insufficient documentation

## 2020-02-04 DIAGNOSIS — E1122 Type 2 diabetes mellitus with diabetic chronic kidney disease: Secondary | ICD-10-CM | POA: Diagnosis not present

## 2020-02-04 DIAGNOSIS — Z8546 Personal history of malignant neoplasm of prostate: Secondary | ICD-10-CM | POA: Insufficient documentation

## 2020-02-04 DIAGNOSIS — Z7984 Long term (current) use of oral hypoglycemic drugs: Secondary | ICD-10-CM | POA: Diagnosis not present

## 2020-02-04 DIAGNOSIS — Z79899 Other long term (current) drug therapy: Secondary | ICD-10-CM | POA: Insufficient documentation

## 2020-02-04 LAB — URINALYSIS, ROUTINE W REFLEX MICROSCOPIC
Bacteria, UA: NONE SEEN
Bilirubin Urine: NEGATIVE
Glucose, UA: NEGATIVE mg/dL
Hgb urine dipstick: NEGATIVE
Ketones, ur: NEGATIVE mg/dL
Leukocytes,Ua: NEGATIVE
Nitrite: NEGATIVE
Protein, ur: 30 mg/dL — AB
Specific Gravity, Urine: 1.009 (ref 1.005–1.030)
pH: 7 (ref 5.0–8.0)

## 2020-02-04 NOTE — ED Triage Notes (Signed)
Pt states he hasn't urinated since 11pm, states he was started on lasix and feels like he should be able to urinate more.  Pt denies pain or discomfort.

## 2020-02-04 NOTE — ED Provider Notes (Signed)
[facility] EMERGENCY DEPARTMENT Provider Note   CSN: [id number] Arrival date & time: 02/04/20  0056     History Chief Complaint  Patient presents with  . Urinary Retention    Owynn Mosqueda is a 84 y.o. male.  Patient was recently admitted to the hospital at which time he had a Foley catheter.  It was removed on Wednesday.  He presents here with suprapubic discomfort and decreased urination over the last few hours.  Patient states he has the urge to urinate but is unable to.  He had decreased urination throughout the day yesterday.  Has some suprapubic discomfort related to that but no back pain.  No other associated symptoms.  No fevers. Has known CKD.        Past Medical History:  Diagnosis Date  . Arthritis   . CKD (chronic kidney disease) stage 4, GFR 15-29 ml/min (HCC)   . Essential hypertension   . Hyperlipidemia   . Prostate cancer ([place])   . Renal insufficiency   . Type 2 diabetes mellitus [facility])     Patient Active Problem List   Diagnosis Date Noted  . Fall at home, initial encounter 07/26/2018  . Acute left-sided weakness 07/25/2018  . Hyperlipidemia 07/25/2018  . CKD (chronic kidney disease), stage IV ([place]) 05/13/2018  . Chronic kidney disease, stage 4 (severe) ([place]) 05/13/2018  . Chest pain 05/12/2018  . Elevated troponin 07/02/2017  . DM (diabetes mellitus), type 2 with renal complications ([place]) 29/51/8841  . IBS (irritable bowel syndrome) 01/28/2017  . Prostate cancer ([place]) 07/10/2014  . Abnormal ECG 04/12/2013  . HTN (hypertension) 04/12/2013  . SOB (shortness of breath) 04/12/2013  . Type 2 diabetes mellitus with diabetic nephropathy ([place]) 01/11/2013  . CHEST PAIN-UNSPECIFIED 06/12/2009    Past Surgical History:  Procedure Laterality Date  . CATARACT EXTRACTION W/PHACO Right 04/28/2016   Procedure: CATARACT EXTRACTION PHACO AND INTRAOCULAR LENS PLACEMENT (IOC);  Surgeon: Williams Che, MD;  Location: [facility] ORS;  Service: Ophthalmology;  Laterality:  Right;  CDE: 4.28  . COLONOSCOPY     about 2 years in [place]   . CYSTECTOMY         Family History  Problem Relation Age of Onset  . Heart disease Mother        cause of death  . Diabetes Sister   . Colon cancer Neg Hx     Social History   Tobacco Use  . Smoking status: Former Smoker    Packs/day: 2.00    Years: 25.00    Pack years: 50.00    Types: Pipe, Cigars    Start date: 11/08/1954    Quit date: 10/14/1987    Years since quitting: 32.3  . Smokeless tobacco: Never Used  . Tobacco comment: About 2 pipes a day  Substance Use Topics  . Alcohol use: No    Alcohol/week: 0.0 standard drinks  . Drug use: No    Home Medications Prior to Admission medications   Medication Sig Start Date End Date Taking? Authorizing Provider  albuterol (PROVENTIL HFA;VENTOLIN HFA) 108 (90 Base) MCG/ACT inhaler Inhale 1-2 puffs into the lungs every 6 (six) hours as needed for wheezing or shortness of breath.    [provider]  Ascorbic Acid (VITAMIN C) 1000 MG tablet Take by mouth.    [provider]  aspirin 81 MG tablet Take 81 mg by mouth daily.    [provider]  Cholecalciferol (VITAMIN D-3) 1000 UNITS CAPS Take 1 capsule by mouth once  a week.     [provider]  docusate sodium (COLACE) 100 MG capsule Take 100 mg by mouth daily.     [provider]  fluticasone Asencion Islam) 50 MCG/ACT nasal spray  01/14/19   [provider]  furosemide (LASIX) 20 MG tablet Take 1 tablet (20 mg total) by mouth daily for 5 days. 11/14/18 06/07/19  Noemi Chapel, MD  gabapentin (NEURONTIN) 300 MG capsule Take 1 capsule by mouth 2 (two) times daily. 01/19/15   [provider]  hydrALAZINE (APRESOLINE) 50 MG tablet Take 50 mg by mouth 3 (three) times daily.    [provider]  HYDROcodone-acetaminophen (NORCO) 5-325 MG tablet Take 1-2 tablets by mouth every 6 (six) hours as needed. 12/25/19   Veryl Speak, MD  linagliptin (TRADJENTA) 5 MG TABS  tablet Take 5 mg by mouth daily.    [provider]  Lysine 500 MG CAPS Take 500 mg by mouth daily.    [provider]  metoprolol tartrate (LOPRESSOR) 25 MG tablet Take 0.5 tablets (12.5 mg total) by mouth 2 (two) times daily. 06/16/19   Herminio Commons, MD  naproxen (NAPROSYN) 375 MG tablet Take 1 tablet (375 mg total) by mouth 2 (two) times daily. 12/16/19   Nat Christen, MD  NIFEdipine (PROCARDIA XL/ADALAT-CC) 90 MG 24 hr tablet TAKE (1) TABLET BY MOUTH ONCE DAILY. 06/17/18   Herminio Commons, MD  omeprazole (PRILOSEC) 20 MG capsule Take 1 capsule by mouth 2 (two) times daily. 04/06/13   [provider]  pravastatin (PRAVACHOL) 40 MG tablet Take 1 tablet by mouth every evening.  04/06/13   [provider]  predniSONE (DELTASONE) 10 MG tablet Take 2 tablets (20 mg total) by mouth 2 (two) times daily. 12/25/19   Veryl Speak, MD  sodium bicarbonate 650 MG tablet Take by mouth. 06/24/19   [provider]  tamsulosin (FLOMAX) 0.4 MG CAPS Take 1 capsule by mouth daily.  04/06/13   [provider]  traMADol (ULTRAM) 50 MG tablet Take 50 mg by mouth 2 (two) times daily as needed. 10/26/19   [provider]  valACYclovir (VALTREX) 1000 MG tablet Take 1 tablet (1,000 mg total) by mouth 3 (three) times daily. 04/09/19   [place] Ferguson, MD  zolpidem Lorrin Mais) 5 MG tablet  07/11/19   [provider]    Allergies    Patient has no known allergies.  Review of Systems   Review of Systems  All other systems reviewed and are negative.   Physical Exam Updated Vital Signs BP (!) 147/59   Pulse 85   Temp 98 F (36.7 C)   Resp 18   Ht 5\' 5"  (1.651 m)   Wt 70.3 kg   SpO2 98%   BMI 25.79 kg/m   Physical Exam Vitals and nursing note reviewed.  Constitutional:      Appearance: He is well-developed.  HENT:     Head: Normocephalic and atraumatic.  Cardiovascular:     Rate and Rhythm: Normal rate.  Pulmonary:     Effort: Pulmonary  effort is normal. No respiratory distress.  Abdominal:     General: There is no distension.  Musculoskeletal:        General: No swelling or tenderness. Normal range of motion.     Cervical back: Normal range of motion.  Skin:    General: Skin is warm and dry.  Neurological:     General: No focal deficit present.     Mental Status: He  is alert.     ED Results / Procedures / Treatments   Labs (all labs ordered are listed, but only abnormal results are displayed) Labs Reviewed  URINALYSIS, ROUTINE W REFLEX MICROSCOPIC - Abnormal; Notable for the following components:      Result Value   Color, Urine STRAW (*)    Protein, ur 30 (*)    All other components within normal limits    EKG None  Radiology No results found.  Procedures Procedures (including critical care time)  Medications Ordered in ED Medications - No data to display  ED Course  I have reviewed the triage vital signs and the nursing notes.  Pertinent labs & imaging results that were available during my care of the patient were reviewed by me and considered in my medical decision making (see chart for details).    MDM Rules/Calculators/A&P                      Foley placed with >300cc output. Relief of symptoms. Short lived period of retention, doubt significant renal effects, will defer testing at this time.   Final Clinical Impression(s) / ED Diagnoses Final diagnoses:  Urinary retention  Foley catheter in place    Rx / DC Orders ED Discharge Orders    None       Johari Pinney, Corene Cornea, MD 02/04/20 [phone]

## 2020-02-07 ENCOUNTER — Encounter: Payer: Self-pay | Admitting: Urology

## 2020-02-07 ENCOUNTER — Ambulatory Visit: Payer: Medicare Other | Admitting: Urology

## 2020-02-07 ENCOUNTER — Other Ambulatory Visit: Payer: Self-pay

## 2020-02-07 VITALS — BP 144/70 | HR 75 | Temp 98.2°F | Ht 65.0 in | Wt 155.0 lb

## 2020-02-07 DIAGNOSIS — E1122 Type 2 diabetes mellitus with diabetic chronic kidney disease: Secondary | ICD-10-CM | POA: Diagnosis not present

## 2020-02-07 DIAGNOSIS — Z8546 Personal history of malignant neoplasm of prostate: Secondary | ICD-10-CM

## 2020-02-07 DIAGNOSIS — N185 Chronic kidney disease, stage 5: Secondary | ICD-10-CM | POA: Diagnosis not present

## 2020-02-07 DIAGNOSIS — N184 Chronic kidney disease, stage 4 (severe): Secondary | ICD-10-CM | POA: Diagnosis not present

## 2020-02-07 DIAGNOSIS — E1142 Type 2 diabetes mellitus with diabetic polyneuropathy: Secondary | ICD-10-CM | POA: Diagnosis not present

## 2020-02-07 DIAGNOSIS — R339 Retention of urine, unspecified: Secondary | ICD-10-CM

## 2020-02-07 DIAGNOSIS — E1129 Type 2 diabetes mellitus with other diabetic kidney complication: Secondary | ICD-10-CM | POA: Diagnosis not present

## 2020-02-07 DIAGNOSIS — D631 Anemia in chronic kidney disease: Secondary | ICD-10-CM | POA: Diagnosis not present

## 2020-02-07 DIAGNOSIS — G92 Toxic encephalopathy: Secondary | ICD-10-CM | POA: Diagnosis not present

## 2020-02-07 DIAGNOSIS — R809 Proteinuria, unspecified: Secondary | ICD-10-CM | POA: Diagnosis not present

## 2020-02-07 DIAGNOSIS — J189 Pneumonia, unspecified organism: Secondary | ICD-10-CM | POA: Diagnosis not present

## 2020-02-07 NOTE — Progress Notes (Signed)
See H& P.

## 2020-02-07 NOTE — Progress Notes (Signed)
H&P  Chief Complaint: ER f/u Urinary Retention  History of Present Illness:   4.27.2021: This man presents today in urinary retention with foley cath placed per ER on 4.24.2021. He attributes this to the general decline he has been experiencing since an unexplained fall 2 mo prior (extensive work-up revealed no clear etiology). Prior to his current retentive episode he reports having had some urinary difficulties and a feeling of incomplete emptying. He was started on tamsulosin and lasix to help these sx's but these meds have apparently been less effective lately. He was on catheter during his recent hospitalization but reports voiding independently after it was removed.   (below copied from [facility] records):  Pelvic Pain: Steve [name] is a 84 year-old male established patient who is here for pelvic pain.  The problem is on both sides. He first noticed the pain approximately 04/12/2014.   For several years he has intermittent pelvic pain which worsens after intercourse. He pelvic pain, testicular pain and penile pain started 3-7 days after intercourse and then last for several days. He notes prostate message makes the pain better. He has previously been treated with antibiotics for presumed prostatitis. His last events lasted 4 days. Multiple UAs show no infection.   Prostate Cancer:  He did receive radiation therapy for his cancer. He was treated with xrt for his cancer. Patient denies brachytherapy and high dose radiation.   His PSA blood tests have been low since his prostate cancer treatment was started.   TRUS/Bx performed for PSA 11 on 3.8.2015. PSA 11.5. Prostatic volume 18 ml. PSA density 0.63 . All 3 rt sided sextants revealed GS 3+3 adenocarcinoma. He subsequently completed EBRT.   10.13.2020: He returns today for follow-up. He denies any blood per urine but does have occasional blood per stool (left on toilet paper) -- this has been evaluated already with no significant findings aside  from hemorrhoids. He denies any significant urinary concerns.   Past Medical History:  Diagnosis Date  . Arthritis   . CKD (chronic kidney disease) stage 4, GFR 15-29 ml/min (HCC)   . Essential hypertension   . Hyperlipidemia   . Prostate cancer ([place])   . Renal insufficiency   . Type 2 diabetes mellitus ([place])     Past Surgical History:  Procedure Laterality Date  . CATARACT EXTRACTION W/PHACO Right 04/28/2016   Procedure: CATARACT EXTRACTION PHACO AND INTRAOCULAR LENS PLACEMENT (IOC);  Surgeon: Williams Che, MD;  Location: [facility];  Service: Ophthalmology;  Laterality: Right;  CDE: 4.28  . COLONOSCOPY     about 2 years in [place]   . CYSTECTOMY      Home Medications:  Allergies as of 02/07/2020   No Known Allergies     Medication List       Accurate as of February 07, 2020  9:02 AM. If you have any questions, ask your nurse or doctor.        albuterol 108 (90 Base) MCG/ACT inhaler Commonly known as: VENTOLIN HFA Inhale 1-2 puffs into the lungs every 6 (six) hours as needed for wheezing or shortness of breath.   aspirin 81 MG tablet Take 81 mg by mouth daily.   docusate sodium 100 MG capsule Commonly known as: COLACE Take 100 mg by mouth daily.   fluticasone 50 MCG/ACT nasal spray Commonly known as: FLONASE   furosemide 20 MG tablet Commonly known as: LASIX Take 1 tablet (20 mg total) by mouth daily for 5 days.   gabapentin 300 MG capsule  Commonly known as: NEURONTIN Take 1 capsule by mouth 2 (two) times daily.   hydrALAZINE 50 MG tablet Commonly known as: APRESOLINE Take 50 mg by mouth 3 (three) times daily.   HYDROcodone-acetaminophen 5-325 MG tablet Commonly known as: Norco Take 1-2 tablets by mouth every 6 (six) hours as needed.   Lysine 500 MG Caps Take 500 mg by mouth daily.   metoprolol tartrate 25 MG tablet Commonly known as: LOPRESSOR Take 0.5 tablets (12.5 mg total) by mouth 2 (two) times daily.   naproxen 375 MG tablet Commonly known as:  NAPROSYN Take 1 tablet (375 mg total) by mouth 2 (two) times daily.   NIFEdipine 90 MG 24 hr tablet Commonly known as: PROCARDIA XL/NIFEDICAL-XL TAKE (1) TABLET BY MOUTH ONCE DAILY.   omeprazole 20 MG capsule Commonly known as: PRILOSEC Take 1 capsule by mouth 2 (two) times daily.   pravastatin 40 MG tablet Commonly known as: PRAVACHOL Take 1 tablet by mouth every evening.   predniSONE 10 MG tablet Commonly known as: DELTASONE Take 2 tablets (20 mg total) by mouth 2 (two) times daily.   sodium bicarbonate 650 MG tablet Take by mouth.   tamsulosin 0.4 MG Caps capsule Commonly known as: FLOMAX Take 1 capsule by mouth daily.   Tradjenta 5 MG Tabs tablet Generic drug: linagliptin Take 5 mg by mouth daily.   traMADol 50 MG tablet Commonly known as: ULTRAM Take 50 mg by mouth 2 (two) times daily as needed.   valACYclovir 1000 MG tablet Commonly known as: VALTREX Take 1 tablet (1,000 mg total) by mouth 3 (three) times daily.   vitamin C 1000 MG tablet Take by mouth.   Vitamin D-3 25 MCG (1000 UT) Caps Take 1 capsule by mouth once a week.   zolpidem 5 MG tablet Commonly known as: AMBIEN       Allergies: No Known Allergies  Family History  Problem Relation Age of Onset  . Heart disease Mother        cause of death  . Diabetes Sister   . Colon cancer Neg Hx     Social History:  reports that he quit smoking about 32 years ago. His smoking use included pipe and cigars. He started smoking about 65 years ago. He has a 50.00 pack-year smoking history. He has never used smokeless tobacco. He reports that he does not drink alcohol or use drugs.  ROS: A complete review of systems was performed.  All systems are negative except for pertinent findings as noted.  Physical Exam:  Vital signs in last 24 hours: There were no vitals taken for this visit. Constitutional:  Alert and oriented, No acute distress Cardiovascular: Regular rate  Respiratory: Normal respiratory  effort GI: Abdomen is soft, nontender, nondistended, no abdominal masses. No CVAT.  Genitourinary: Normal male phallus (uncircumcised), testes are descended bilaterally and non-tender and without masses, scrotum is normal in appearance without lesions or masses, perineum is normal on inspection. Neurologic: Grossly intact, no focal deficits Psychiatric: Normal mood and affect  I have reviewed prior pt notes  I have reviewed notes from referring/previous physician  I have reviewed prior PSA results   Cystoscopy Procedure Note:  Indication: Retention  After informed consent and discussion of the procedure and its risks, Steve [name] was positioned and prepped in the standard fashion. Cystoscopy was performed with a flexible cystoscope. The urethra, bladder neck and entire bladder was visualized in a standard fashion. The prostate was nonobstructive. The ureteral orifices were visualized in their  normal location and orientation. No bladder lesions. Involuntary detrusor contraction during cysto.   Impression/Assessment:  Cysto today showed no evident obstruction. Catheter removed.   Plan:  1. Catheter removed today -- instructed to return later in afternoon if he is unable to urinate  2. Return in 1 wk for OV  3. Work on constipation

## 2020-02-08 DIAGNOSIS — R339 Retention of urine, unspecified: Secondary | ICD-10-CM | POA: Diagnosis not present

## 2020-02-08 DIAGNOSIS — Z466 Encounter for fitting and adjustment of urinary device: Secondary | ICD-10-CM | POA: Diagnosis not present

## 2020-02-09 DIAGNOSIS — R262 Difficulty in walking, not elsewhere classified: Secondary | ICD-10-CM | POA: Diagnosis not present

## 2020-02-09 DIAGNOSIS — M6281 Muscle weakness (generalized): Secondary | ICD-10-CM | POA: Diagnosis not present

## 2020-02-09 DIAGNOSIS — G92 Toxic encephalopathy: Secondary | ICD-10-CM | POA: Diagnosis not present

## 2020-02-09 DIAGNOSIS — R2689 Other abnormalities of gait and mobility: Secondary | ICD-10-CM | POA: Diagnosis not present

## 2020-02-14 ENCOUNTER — Ambulatory Visit: Payer: Medicare Other | Admitting: Urology

## 2020-02-14 ENCOUNTER — Encounter: Payer: Self-pay | Admitting: Urology

## 2020-02-14 ENCOUNTER — Other Ambulatory Visit: Payer: Self-pay

## 2020-02-14 VITALS — BP 152/67 | HR 87 | Temp 98.4°F | Ht 65.0 in | Wt 165.0 lb

## 2020-02-14 DIAGNOSIS — R339 Retention of urine, unspecified: Secondary | ICD-10-CM

## 2020-02-14 DIAGNOSIS — Z8546 Personal history of malignant neoplasm of prostate: Secondary | ICD-10-CM

## 2020-02-14 LAB — POCT URINALYSIS DIPSTICK
Bilirubin, UA: NEGATIVE
Blood, UA: NEGATIVE
Glucose, UA: NEGATIVE
Ketones, UA: NEGATIVE
Nitrite, UA: NEGATIVE
Protein, UA: POSITIVE — AB
Spec Grav, UA: <=1.005 — AB (ref 1.010–1.025)
Urobilinogen, UA: NEGATIVE E.U./dL — AB
pH, UA: 8.5 — AB (ref 5.0–8.0)

## 2020-02-14 LAB — BLADDER SCAN AMB NON-IMAGING: Scan Result: 55.4

## 2020-02-14 NOTE — Progress Notes (Signed)
See progress note.

## 2020-02-14 NOTE — Progress Notes (Signed)
H&P  Chief Complaint: Urinary Retention F/U  History of Present Illness:   5.4.2021: This patient returns today for brief follow-up having had catheter removed at last week's OV. He has had no issues with emptying his bladder since last seen though does c/o urinary freq. His stream is intermittently strong, typically when full.  IPSS Questionnaire (AUA-7): Over the past month.   1)  How often have you had a sensation of not emptying your bladder completely after you finish urinating?  1 - Less than 1 time in 5  2)  How often have you had to urinate again less than two hours after you finished urinating? 2 - Less than half the time  3)  How often have you found you stopped and started again several times when you urinated?  4 - More than half the time  4) How difficult have you found it to postpone urination?  3 - About half the time  5) How often have you had a weak urinary stream?  2 - Less than half the time  6) How often have you had to push or strain to begin urination?  1 - Less than 1 time in 5  7) How many times did you most typically get up to urinate from the time you went to bed until the time you got up in the morning?  4 - 4 times  Total score:  0-7 mildly symptomatic   8-19 moderately symptomatic   20-35 severely symptomatic   Total: 17 QoL: 2  Past Medical History:  Diagnosis Date  . Arthritis   . CKD (chronic kidney disease) stage 4, GFR 15-29 ml/min (HCC)   . Essential hypertension   . Hyperlipidemia   . Prostate cancer ([place])   . Renal insufficiency   . Type 2 diabetes mellitus ([place])     Past Surgical History:  Procedure Laterality Date  . CATARACT EXTRACTION W/PHACO Right 04/28/2016   Procedure: CATARACT EXTRACTION PHACO AND INTRAOCULAR LENS PLACEMENT (IOC);  Surgeon: Williams Che, MD;  Location: [facility];  Service: Ophthalmology;  Laterality: Right;  CDE: 4.28  . COLONOSCOPY     about 2 years in [place]   . CYSTECTOMY      Home Medications:  Allergies as  of 02/14/2020   No Known Allergies     Medication List       Accurate as of Feb 14, 2020  9:38 AM. If you have any questions, ask your nurse or doctor.        Accu-Chek Aviva Plus test strip Generic drug: glucose blood   OneTouch Verio test strip Generic drug: glucose blood 1 each 2 (two) times daily.   Accu-Chek Softclix Lancets lancets   albuterol 108 (90 Base) MCG/ACT inhaler Commonly known as: VENTOLIN HFA Inhale 1-2 puffs into the lungs every 6 (six) hours as needed for wheezing or shortness of breath.   aspirin 81 MG tablet Take 81 mg by mouth daily.   docusate sodium 100 MG capsule Commonly known as: COLACE Take 100 mg by mouth daily.   fluticasone 50 MCG/ACT nasal spray Commonly known as: FLONASE   furosemide 20 MG tablet Commonly known as: LASIX Take 1 tablet (20 mg total) by mouth daily for 5 days.   gabapentin 300 MG capsule Commonly known as: NEURONTIN Take 1 capsule by mouth 2 (two) times daily.   hydrALAZINE 50 MG tablet Commonly known as: APRESOLINE Take 50 mg by mouth 3 (three) times daily.   hydrALAZINE 50 MG  tablet Commonly known as: APRESOLINE SMARTSIG:1 Tablet(s) By Mouth 3 Times Daily   HYDROcodone-acetaminophen 5-325 MG tablet Commonly known as: Norco Take 1-2 tablets by mouth every 6 (six) hours as needed.   Lysine 500 MG Caps Take 500 mg by mouth daily.   metoprolol tartrate 25 MG tablet Commonly known as: LOPRESSOR Take 0.5 tablets (12.5 mg total) by mouth 2 (two) times daily.   naproxen 375 MG tablet Commonly known as: NAPROSYN Take 1 tablet (375 mg total) by mouth 2 (two) times daily.   NIFEdipine 90 MG 24 hr tablet Commonly known as: PROCARDIA XL/NIFEDICAL-XL TAKE (1) TABLET BY MOUTH ONCE DAILY.   omeprazole 20 MG capsule Commonly known as: PRILOSEC Take 1 capsule by mouth 2 (two) times daily.   pravastatin 40 MG tablet Commonly known as: PRAVACHOL Take 1 tablet by mouth every evening.   predniSONE 10 MG tablet  Commonly known as: DELTASONE Take 2 tablets (20 mg total) by mouth 2 (two) times daily.   sodium bicarbonate 650 MG tablet Take by mouth.   tamsulosin 0.4 MG Caps capsule Commonly known as: FLOMAX Take 1 capsule by mouth daily.   Tradjenta 5 MG Tabs tablet Generic drug: linagliptin Take 5 mg by mouth daily.   traMADol 50 MG tablet Commonly known as: ULTRAM Take 50 mg by mouth 2 (two) times daily as needed.   valACYclovir 1000 MG tablet Commonly known as: VALTREX Take 1 tablet (1,000 mg total) by mouth 3 (three) times daily.   vitamin C 1000 MG tablet Take by mouth.   Vitamin D-3 25 MCG (1000 UT) Caps Take 1 capsule by mouth once a week.   zolpidem 5 MG tablet Commonly known as: AMBIEN       Allergies: No Known Allergies  Family History  Problem Relation Age of Onset  . Heart disease Mother        cause of death  . Diabetes Sister   . Colon cancer Neg Hx     Social History:  reports that he quit smoking about 32 years ago. His smoking use included pipe and cigars. He started smoking about 65 years ago. He has a 50.00 pack-year smoking history. He has never used smokeless tobacco. He reports that he does not drink alcohol or use drugs.  Urological Symptom Review  Patient is experiencing the following symptoms: Frequent urination Get up at night to urinate  Review of Systems Gastrointestinal (upper)  : Negative for upper GI symptoms Gastrointestinal (lower) : Negative for lower GI symptoms Constitutional : Negative for symptoms Skin: Negative for skin symptoms Eyes: Negative for eye symptoms Ear/Nose/Throat : Negative for Ear/Nose/Throat symptoms Hematologic/Lymphatic: Negative for Hematologic/Lymphatic symptoms Cardiovascular : Negative for cardiovascular symptoms Respiratory : Negative for respiratory symptoms Endocrine: Negative for endocrine symptoms Musculoskeletal: Negative for musculoskeletal symptoms Neurological: Negative for  neurological symptoms Psycholgic: Negative for psychiatric symptoms  Physical Exam:  Vital signs in last 24 hours: There were no vitals taken for this visit. Constitutional:  Alert and oriented, No acute distress Cardiovascular: Regular rate  Respiratory: Normal respiratory effort Neurologic: Grossly intact, no focal deficits Psychiatric: Normal mood and affect  I have reviewed prior pt notes  I have reviewed notes from referring/previous physicians  I have reviewed urinalysis results  I have independently reviewed prior imaging  I have reviewed prior PSA results  I have reviewed prior urine culture Impression/Assessment:  PV [place] today indicates he is emptying well. His urinary sx's could be secondary to a bladder atony but may also  be related to his frequent constipation, which is why I think he should try to keep up with his bowel regimen.  Plan:  1. Advised to maintain a regular bowel regimen   2. Return for OV in 1 yr w/ PSA

## 2020-02-15 DIAGNOSIS — M6281 Muscle weakness (generalized): Secondary | ICD-10-CM | POA: Diagnosis not present

## 2020-02-15 DIAGNOSIS — G92 Toxic encephalopathy: Secondary | ICD-10-CM | POA: Diagnosis not present

## 2020-02-15 DIAGNOSIS — R262 Difficulty in walking, not elsewhere classified: Secondary | ICD-10-CM | POA: Diagnosis not present

## 2020-02-15 DIAGNOSIS — R2689 Other abnormalities of gait and mobility: Secondary | ICD-10-CM | POA: Diagnosis not present

## 2020-02-16 DIAGNOSIS — N185 Chronic kidney disease, stage 5: Secondary | ICD-10-CM | POA: Diagnosis not present

## 2020-02-16 DIAGNOSIS — E211 Secondary hyperparathyroidism, not elsewhere classified: Secondary | ICD-10-CM | POA: Diagnosis not present

## 2020-02-16 DIAGNOSIS — D631 Anemia in chronic kidney disease: Secondary | ICD-10-CM | POA: Diagnosis not present

## 2020-02-16 DIAGNOSIS — R809 Proteinuria, unspecified: Secondary | ICD-10-CM | POA: Diagnosis not present

## 2020-02-16 DIAGNOSIS — I129 Hypertensive chronic kidney disease with stage 1 through stage 4 chronic kidney disease, or unspecified chronic kidney disease: Secondary | ICD-10-CM | POA: Diagnosis not present

## 2020-02-20 DIAGNOSIS — I10 Essential (primary) hypertension: Secondary | ICD-10-CM | POA: Diagnosis not present

## 2020-02-22 DIAGNOSIS — R262 Difficulty in walking, not elsewhere classified: Secondary | ICD-10-CM | POA: Diagnosis not present

## 2020-02-22 DIAGNOSIS — M6281 Muscle weakness (generalized): Secondary | ICD-10-CM | POA: Diagnosis not present

## 2020-02-22 DIAGNOSIS — R2689 Other abnormalities of gait and mobility: Secondary | ICD-10-CM | POA: Diagnosis not present

## 2020-02-22 DIAGNOSIS — G92 Toxic encephalopathy: Secondary | ICD-10-CM | POA: Diagnosis not present

## 2020-02-23 DIAGNOSIS — D631 Anemia in chronic kidney disease: Secondary | ICD-10-CM | POA: Diagnosis not present

## 2020-02-23 DIAGNOSIS — R809 Proteinuria, unspecified: Secondary | ICD-10-CM | POA: Diagnosis not present

## 2020-02-23 DIAGNOSIS — E1129 Type 2 diabetes mellitus with other diabetic kidney complication: Secondary | ICD-10-CM | POA: Diagnosis not present

## 2020-02-23 DIAGNOSIS — N185 Chronic kidney disease, stage 5: Secondary | ICD-10-CM | POA: Diagnosis not present

## 2020-02-23 DIAGNOSIS — E1122 Type 2 diabetes mellitus with diabetic chronic kidney disease: Secondary | ICD-10-CM | POA: Diagnosis not present

## 2020-02-24 DIAGNOSIS — N189 Chronic kidney disease, unspecified: Secondary | ICD-10-CM | POA: Diagnosis not present

## 2020-02-24 DIAGNOSIS — E211 Secondary hyperparathyroidism, not elsewhere classified: Secondary | ICD-10-CM | POA: Diagnosis not present

## 2020-02-24 DIAGNOSIS — R809 Proteinuria, unspecified: Secondary | ICD-10-CM | POA: Diagnosis not present

## 2020-02-24 DIAGNOSIS — D631 Anemia in chronic kidney disease: Secondary | ICD-10-CM | POA: Diagnosis not present

## 2020-02-24 DIAGNOSIS — I129 Hypertensive chronic kidney disease with stage 1 through stage 4 chronic kidney disease, or unspecified chronic kidney disease: Secondary | ICD-10-CM | POA: Diagnosis not present

## 2020-02-29 ENCOUNTER — Other Ambulatory Visit: Payer: Self-pay

## 2020-02-29 ENCOUNTER — Emergency Department (HOSPITAL_COMMUNITY): Payer: Medicare Other

## 2020-02-29 ENCOUNTER — Emergency Department (HOSPITAL_COMMUNITY)
Admission: EM | Admit: 2020-02-29 | Discharge: 2020-02-29 | Disposition: A | Discharge status: Home / Self Care | Payer: Medicare Other | Attending: Emergency Medicine | Admitting: Emergency Medicine

## 2020-02-29 ENCOUNTER — Encounter (HOSPITAL_COMMUNITY): Payer: Self-pay | Admitting: Emergency Medicine

## 2020-02-29 DIAGNOSIS — E11649 Type 2 diabetes mellitus with hypoglycemia without coma: Secondary | ICD-10-CM | POA: Diagnosis present

## 2020-02-29 DIAGNOSIS — Z7982 Long term (current) use of aspirin: Secondary | ICD-10-CM | POA: Diagnosis not present

## 2020-02-29 DIAGNOSIS — Z87891 Personal history of nicotine dependence: Secondary | ICD-10-CM | POA: Insufficient documentation

## 2020-02-29 DIAGNOSIS — N184 Chronic kidney disease, stage 4 (severe): Secondary | ICD-10-CM | POA: Insufficient documentation

## 2020-02-29 DIAGNOSIS — Z79899 Other long term (current) drug therapy: Secondary | ICD-10-CM | POA: Insufficient documentation

## 2020-02-29 DIAGNOSIS — I129 Hypertensive chronic kidney disease with stage 1 through stage 4 chronic kidney disease, or unspecified chronic kidney disease: Secondary | ICD-10-CM | POA: Insufficient documentation

## 2020-02-29 DIAGNOSIS — Z7984 Long term (current) use of oral hypoglycemic drugs: Secondary | ICD-10-CM | POA: Insufficient documentation

## 2020-02-29 DIAGNOSIS — I160 Hypertensive urgency: Secondary | ICD-10-CM | POA: Diagnosis not present

## 2020-02-29 DIAGNOSIS — J984 Other disorders of lung: Secondary | ICD-10-CM | POA: Diagnosis not present

## 2020-02-29 DIAGNOSIS — I10 Essential (primary) hypertension: Secondary | ICD-10-CM | POA: Diagnosis not present

## 2020-02-29 LAB — URINALYSIS, ROUTINE W REFLEX MICROSCOPIC
Bacteria, UA: NONE SEEN
Bilirubin Urine: NEGATIVE
Glucose, UA: NEGATIVE mg/dL
Hgb urine dipstick: NEGATIVE
Ketones, ur: NEGATIVE mg/dL
Leukocytes,Ua: NEGATIVE
Nitrite: NEGATIVE
Protein, ur: >=300 mg/dL — AB
Specific Gravity, Urine: 1.007 (ref 1.005–1.030)
pH: 8 (ref 5.0–8.0)

## 2020-02-29 LAB — CBC WITH DIFFERENTIAL/PLATELET
Abs Immature Granulocytes: 0.07 10*3/uL (ref 0.00–0.07)
Basophils Absolute: 0 10*3/uL (ref 0.0–0.1)
Basophils Relative: 0 %
Eosinophils Absolute: 0.1 10*3/uL (ref 0.0–0.5)
Eosinophils Relative: 1 %
HCT: 30.7 % — ABNORMAL LOW (ref 39.0–52.0)
Hemoglobin: 10 g/dL — ABNORMAL LOW (ref 13.0–17.0)
Immature Granulocytes: 1 %
Lymphocytes Relative: 11 %
Lymphs Abs: 0.7 10*3/uL (ref 0.7–4.0)
MCH: 26.9 pg (ref 26.0–34.0)
MCHC: 32.6 g/dL (ref 30.0–36.0)
MCV: 82.5 fL (ref 80.0–100.0)
Monocytes Absolute: 0.8 10*3/uL (ref 0.1–1.0)
Monocytes Relative: 12 %
Neutro Abs: 5 10*3/uL (ref 1.7–7.7)
Neutrophils Relative %: 75 %
Platelets: 207 10*3/uL (ref 150–400)
RBC: 3.72 MIL/uL — ABNORMAL LOW (ref 4.22–5.81)
RDW: 14.8 % (ref 11.5–15.5)
WBC: 6.7 10*3/uL (ref 4.0–10.5)
nRBC: 0 % (ref 0.0–0.2)

## 2020-02-29 LAB — COMPREHENSIVE METABOLIC PANEL
ALT: 19 U/L (ref 0–44)
AST: 21 U/L (ref 15–41)
Albumin: 4 g/dL (ref 3.5–5.0)
Alkaline Phosphatase: 67 U/L (ref 38–126)
Anion gap: 12 (ref 5–15)
BUN: 37 mg/dL — ABNORMAL HIGH (ref 8–23)
CO2: 20 mmol/L — ABNORMAL LOW (ref 22–32)
Calcium: 9.5 mg/dL (ref 8.9–10.3)
Chloride: 103 mmol/L (ref 98–111)
Creatinine, Ser: 3.83 mg/dL — ABNORMAL HIGH (ref 0.61–1.24)
GFR calc Af Amer: 16 mL/min — ABNORMAL LOW (ref >60)
GFR calc non Af Amer: 14 mL/min — ABNORMAL LOW (ref >60)
Glucose, Bld: 109 mg/dL — ABNORMAL HIGH (ref 70–99)
Potassium: 5.2 mmol/L — ABNORMAL HIGH (ref 3.5–5.1)
Sodium: 135 mmol/L (ref 135–145)
Total Bilirubin: 0.4 mg/dL (ref 0.3–1.2)
Total Protein: 7.5 g/dL (ref 6.5–8.1)

## 2020-02-29 LAB — CBG MONITORING, ED
Glucose-Capillary: 114 mg/dL — ABNORMAL HIGH (ref 70–99)
Glucose-Capillary: 132 mg/dL — ABNORMAL HIGH (ref 70–99)

## 2020-02-29 MED ORDER — NIFEDIPINE ER OSMOTIC RELEASE 30 MG PO TB24
90.0000 mg | ORAL_TABLET | Freq: Every day | ORAL | Status: DC
  Administered 2020-02-29: 90 mg via ORAL
  Filled 2020-02-29: qty 3

## 2020-02-29 MED ORDER — FUROSEMIDE 40 MG PO TABS
40.0000 mg | ORAL_TABLET | Freq: Every day | ORAL | Status: DC
  Administered 2020-02-29: 40 mg via ORAL
  Filled 2020-02-29: qty 1

## 2020-02-29 MED ORDER — HYDRALAZINE HCL 25 MG PO TABS
50.0000 mg | ORAL_TABLET | Freq: Three times a day (TID) | ORAL | Status: DC
  Administered 2020-02-29: 50 mg via ORAL
  Filled 2020-02-29: qty 2

## 2020-02-29 MED ORDER — METOPROLOL TARTRATE 25 MG PO TABS
12.5000 mg | ORAL_TABLET | Freq: Two times a day (BID) | ORAL | Status: DC
  Administered 2020-02-29: 12.5 mg via ORAL
  Filled 2020-02-29: qty 1

## 2020-02-29 NOTE — ED Triage Notes (Signed)
PT states he checked his blood sugar yesterday evening and it was low at 84 so he ate some frosted flakes and it increased to 119 but dropped to 78 about an hour after that. PT states this am CBG 150 when he woke up but dropped to 38 and he ate something and decided to come get eval. PT states he takes oral medications for diabetes.

## 2020-02-29 NOTE — Discharge Instructions (Addendum)
You were given your morning blood pressure medications which include Lasix, hydralazine, metoprolol, the nifdipine.   continue to take your normal lunch medication and dinner medication as prescribed.  I recommend that you follow-up with your primary care doctor who manages your diabetes and explained to them that your blood sugar has dropped and you would like to be reevaluated.   If you experience shortness of breath, chest pain, become very sweaty, headache, nausea, vomiting I want you to come back to the emergency department and be reevaluated

## 2020-02-29 NOTE — ED Provider Notes (Signed)
[facility] EMERGENCY DEPARTMENT Provider Note   CSN: [id number] Arrival date & time: 02/29/20  1017     History Chief Complaint  Patient presents with  . Hypoglycemia    Steve [name] is a 84 y.o. male.  HPI   Patient presents emerged department with chief complaint of hypoglycemia. Patient stated that he noticed his glucose level was low at 12 AM this morning when check before going to bed. Patient stated his glucose level was 78 which is abnormal for him because it normally is in the 120s. Patient ate a snack and reassessed his blood sugar which came up to 120. Patient then waited an hour and recheck it again and dropped back down to 84. Patient then ate a bowl of cereal and his glucose level went back up to 168. Patient reassess his glucose level again and it dropped down to 138 which prompted him to come to the emergency department for further evaluation.   Patient has significant medical history of type II diabetic, hypertension, CKD stage IV/V, prostate cancer. Patient takes Tradjenta once a day in the morning and denies recent changes this medication. Patient states he has decreased appetite but has been eating the same amount of food  He denies fever, chills, sore throat, cough, chest pain, shortness of breath, abdominal pain, or difficulties urinating. Patient did state that he has been urinating more frequently and admits some dysuria but he denies any back pain, change in color or odor of his urine. Patient was recently seen by his nephrologist who increased his hydralazine to 100 mg 3 times a day.     Past Medical History:  Diagnosis Date  . Arthritis   . CKD (chronic kidney disease) stage 4, GFR 15-29 ml/min (HCC)   . Essential hypertension   . Hyperlipidemia   . Prostate cancer ([place])   . Renal insufficiency   . Type 2 diabetes mellitus [facility])     Patient Active Problem List   Diagnosis Date Noted  . Fall at home, initial encounter 07/26/2018  . Acute left-sided  weakness 07/25/2018  . Hyperlipidemia 07/25/2018  . CKD (chronic kidney disease), stage IV ([place]) 05/13/2018  . Chronic kidney disease, stage 4 (severe) ([place]) 05/13/2018  . Chest pain 05/12/2018  . Elevated troponin 07/02/2017  . DM (diabetes mellitus), type 2 with renal complications ([place]) 51/11/5850  . IBS (irritable bowel syndrome) 01/28/2017  . Prostate cancer ([place]) 07/10/2014  . Abnormal ECG 04/12/2013  . HTN (hypertension) 04/12/2013  . SOB (shortness of breath) 04/12/2013  . Type 2 diabetes mellitus with diabetic nephropathy ([place]) 01/11/2013  . CHEST PAIN-UNSPECIFIED 06/12/2009    Past Surgical History:  Procedure Laterality Date  . CATARACT EXTRACTION W/PHACO Right 04/28/2016   Procedure: CATARACT EXTRACTION PHACO AND INTRAOCULAR LENS PLACEMENT (IOC);  Surgeon: Williams Che, MD;  Location: [facility];  Service: Ophthalmology;  Laterality: Right;  CDE: 4.28  . COLONOSCOPY     about 2 years in [place]   . CYSTECTOMY         Family History  Problem Relation Age of Onset  . Heart disease Mother        cause of death  . Diabetes Sister   . Colon cancer Neg Hx     Social History   Tobacco Use  . Smoking status: Former Smoker    Packs/day: 2.00    Years: 25.00    Pack years: 50.00    Types: Pipe, Cigars    Start date: 11/08/1954  Quit date: 10/14/1987    Years since quitting: 32.4  . Smokeless tobacco: Never Used  . Tobacco comment: About 2 pipes a day  Substance Use Topics  . Alcohol use: No    Alcohol/week: 0.0 standard drinks  . Drug use: No    Home Medications Prior to Admission medications   Medication Sig Start Date End Date Taking? Authorizing Provider  Accu-Chek Softclix Lancets lancets  10/27/19  Yes [provider]  albuterol (PROVENTIL HFA;VENTOLIN HFA) 108 (90 Base) MCG/ACT inhaler Inhale 1-2 puffs into the lungs every 6 (six) hours as needed for wheezing or shortness of breath.   Yes [provider]  Ascorbic Acid (VITAMIN C) 1000 MG  tablet Take by mouth.   Yes [provider]  aspirin 81 MG tablet Take 81 mg by mouth daily.   Yes [provider]  furosemide (LASIX) 40 MG tablet Take 40 mg by mouth daily.  12/21/19  Yes [provider]  gabapentin (NEURONTIN) 300 MG capsule Take 1 capsule by mouth 2 (two) times daily. 01/19/15  Yes [provider]  glucose blood (ACCU-CHEK AVIVA PLUS) test strip  11/27/17  Yes [provider]  Lancets Misc. (ACCU-CHEK SOFTCLIX LANCET DEV) KIT  10/28/19  Yes [provider]  linagliptin (TRADJENTA) 5 MG TABS tablet Take 5 mg by mouth daily.   Yes [provider]  metoprolol tartrate (LOPRESSOR) 25 MG tablet Take 0.5 tablets (12.5 mg total) by mouth 2 (two) times daily. Patient taking differently: Take 25 mg by mouth 2 (two) times daily.  06/16/19  Yes Herminio Commons, MD  Multiple Vitamin (MULTIVITAMIN WITH MINERALS) TABS tablet Take 1 tablet by mouth daily.   Yes [provider]  NIFEdipine (PROCARDIA XL/ADALAT-CC) 90 MG 24 hr tablet TAKE (1) TABLET BY MOUTH ONCE DAILY. 06/17/18  Yes Herminio Commons, MD  [facility] VERIO test strip 1 each 2 (two) times daily. 12/21/19  Yes [provider]  pravastatin (PRAVACHOL) 40 MG tablet Take 1 tablet by mouth every evening.  04/06/13  Yes [provider]  sodium bicarbonate 650 MG tablet Take 650 mg by mouth daily.  06/24/19  Yes [provider]  tamsulosin (FLOMAX) 0.4 MG CAPS Take 1 capsule by mouth daily.  04/06/13  Yes [provider]  traMADol (ULTRAM) 50 MG tablet Take 50 mg by mouth 2 (two) times daily as needed. 10/26/19  Yes [provider]  zolpidem (AMBIEN) 5 MG tablet  07/11/19  Yes [provider]  fluticasone (FLONASE) 50 MCG/ACT nasal spray  01/14/19   [provider]  furosemide (LASIX) 20 MG tablet Take 1 tablet (20 mg total) by mouth daily for 5 days. Patient not taking: Reported on 02/29/2020 11/14/18 02/29/20   Noemi Chapel, MD  hydrALAZINE (APRESOLINE) 50 MG tablet Take 50 mg by mouth 3 (three) times daily.    [provider]  hydrALAZINE (APRESOLINE) 50 MG tablet SMARTSIG:1 Tablet(s) By Mouth 3 Times Daily 11/30/19   [provider]  HYDROcodone-acetaminophen (NORCO) 5-325 MG tablet Take 1-2 tablets by mouth every 6 (six) hours as needed. Patient not taking: Reported on 02/29/2020 12/25/19   Veryl Speak, MD  HYDROcodone-acetaminophen (NORCO/VICODIN) 5-325 MG tablet Take by mouth.    [provider]  naproxen (NAPROSYN) 375 MG tablet Take 1 tablet (375 mg total) by mouth 2 (two) times daily. Patient not taking: Reported on 02/07/2020 12/16/19   Nat Christen, MD  predniSONE (DELTASONE) 10 MG tablet Take 2 tablets (20 mg total)  by mouth 2 (two) times daily. Patient not taking: Reported on 02/07/2020 12/25/19   Veryl Speak, MD  valACYclovir (VALTREX) 1000 MG tablet Take 1 tablet (1,000 mg total) by mouth 3 (three) times daily. Patient not taking: Reported on 02/07/2020 04/09/19   Milton Ferguson, MD    Allergies    Patient has no known allergies.  Review of Systems   Review of Systems  Constitutional: Positive for appetite change. Negative for chills, fatigue and fever.       Patient states he has decrease appetite  but  has been eating the same amount of food  HENT: Negative for congestion and sore throat.   Respiratory: Negative for cough and shortness of breath.   Cardiovascular: Positive for leg swelling. Negative for chest pain.       Patient has history of pedal edema and states that his legs have not increased in size.  Gastrointestinal: Negative for abdominal pain, diarrhea, nausea and vomiting.  Genitourinary: Positive for dysuria and frequency. Negative for enuresis.       Patient said he has frequent urination and sometimes has dysuria with it. Denies change in color or odor  Musculoskeletal: Negative for back pain.  Skin: Negative for rash.  Neurological: Negative  for dizziness and headaches.  Hematological: Does not bruise/bleed easily.    Physical Exam Updated Vital Signs BP (!) 190/82   Pulse 81   Temp 98.1 F (36.7 C) (Oral)   Resp 20   Ht 5' 5"  (1.651 m)   Wt 74.8 kg   SpO2 96%   BMI 27.46 kg/m   Physical Exam Vitals and nursing note reviewed.  Constitutional:      General: He is not in acute distress.    Appearance: He is not ill-appearing.  HENT:     Head: Normocephalic and atraumatic.     Nose: No congestion.     Mouth/Throat:     Mouth: Mucous membranes are moist.     Pharynx: Oropharynx is clear.  Eyes:     General: No scleral icterus. Cardiovascular:     Rate and Rhythm: Normal rate and regular rhythm.     Pulses: Normal pulses.     Heart sounds: No murmur. No friction rub. No gallop.   Pulmonary:     Effort: No respiratory distress.     Breath sounds: No wheezing, rhonchi or rales.  Abdominal:     General: There is no distension.     Tenderness: There is no abdominal tenderness. There is no guarding.  Musculoskeletal:        General: No swelling.     Comments: Patient has 1+ pedel edema bilaterally  Skin:    General: Skin is warm and dry.     Findings: No rash.  Neurological:     Mental Status: He is alert.  Psychiatric:        Mood and Affect: Mood normal.     ED Results / Procedures / Treatments   Labs (all labs ordered are listed, but only abnormal results are displayed) Labs Reviewed  COMPREHENSIVE METABOLIC PANEL - Abnormal; Notable for the following components:      Result Value   Potassium 5.2 (*)    CO2 20 (*)    Glucose, Bld 109 (*)    BUN 37 (*)    Creatinine, Ser 3.83 (*)    GFR calc non Af Amer 14 (*)    GFR calc Af Amer 16 (*)    All other components within normal  limits  CBC WITH DIFFERENTIAL/PLATELET - Abnormal; Notable for the following components:   RBC 3.72 (*)    Hemoglobin 10.0 (*)    HCT 30.7 (*)    All other components within normal limits  URINALYSIS, ROUTINE W REFLEX  MICROSCOPIC - Abnormal; Notable for the following components:   Color, Urine STRAW (*)    Protein, ur >=300 (*)    All other components within normal limits  CBG MONITORING, ED - Abnormal; Notable for the following components:   Glucose-Capillary 114 (*)    All other components within normal limits    EKG EKG Interpretation  Date/Time:  Wednesday Feb 29 2020 10:04:08 EDT Ventricular Rate:  86 PR Interval:    QRS Duration: 105 QT Interval:  370 QTC Calculation: 443 R Axis:   -11 Text Interpretation: Sinus rhythm Probable left atrial enlargement Nonspecific T abnormalities, inferior leads Baseline wander in lead(s) V3 Abnormal ECG Confirmed by Carmin Muskrat [phone]) on 02/29/2020 10:30:48 AM   Radiology DG Chest 2 View  Result Date: 02/29/2020 CLINICAL DATA:  Hypoglycemia. EXAM: CHEST - 2 VIEW COMPARISON:  January 05, 2020. FINDINGS: Stable cardiomediastinal silhouette. No pneumothorax or pleural effusion is noted. Minimal linear densities are noted in the left midlung most consistent with post infectious scarring or possibly subsegmental atelectasis. Right lung is unremarkable. Bony thorax is unremarkable. IMPRESSION: Minimal linear densities are noted in left midlung most consistent with post infectious scarring or possibly subsegmental atelectasis. Electronically Signed   By: Marijo Conception M.D.   On: 02/29/2020 08:50    Procedures Procedures (including critical care time)  Medications Ordered in ED Medications  metoprolol tartrate (LOPRESSOR) tablet 12.5 mg (12.5 mg Oral Given 02/29/20 0941)  furosemide (LASIX) tablet 40 mg (40 mg Oral Given 02/29/20 0941)  hydrALAZINE (APRESOLINE) tablet 50 mg (50 mg Oral Given 02/29/20 0942)  NIFEdipine (PROCARDIA-XL/NIFEDICAL-XL) 24 hr tablet 90 mg (90 mg Oral Given 02/29/20 9379)    ED Course  I have reviewed the triage vital signs and the nursing notes.  Pertinent labs & imaging results that were available during my care of the patient  were reviewed by me and considered in my medical decision making (see chart for details).  Clinical Course as of Feb 28 1145  Wed Feb 29, 2020  0905 CBC showed no signs of infection, microcytic anemia with a hemoglobin of 10 which is most likely secondary to anemia to chronic diseases.  CBG showed a glucose level of 114.  Checks x-ray was personally reviewed and did not show consolidations or infection, but did show  infectious scarring or possibly subsegmental atelectasis   [WF]  0910 Patient's blood pressure is elevated but denies headache, dizziness, change in vision, chest pain, shortness of breath or abdominal pain.  Low suspicion the patient is suffering from hypertensive crisis, more likely from not taking blood pressure medication this morning will order home meds and reevaluate.   [WF]  1020 CVH did not show any electrolyte abnormalities, did show elevated creatinine levels of 3.83 and a GFR of 14.  Patient's urine analysis did not show any signs of infection but did have proteinuria of greater than 300.  Patient was reevaluated and does not have any complaints at this time, patient had normal heart tones, breath sounds were clear bilaterally, abdomen was soft nontender dull to percussion.  Patient's blood pressure medication was given and will reevaluate blood pressure   [WF]    Clinical Course User Index [WF] Marcello Fennel, PA-C  MDM Rules/Calculators/A&P                     Patient is not in any acute distress, and is resting comfortably in bed, and breathing without difficulty.  Patient's vitals remained stable, patient's blood pressure has decreased from systolic of 625/63 to 893/73.  He denies any change in his vision, headache, chest pain, shortness of breath, abdominal pain and does not endorse any pain at this time. unLikely that the patient's glucose level dropped due to infection as his chest x-ray, CBC, UA did not show any signs of infection. Patient's GFR is at baseline  as he is stage IV/V chronic kidney disease.  it is possible that he is not eating as much or that the medication is reacting with another medication that he is currently on.  It is recommended that he follows up with his primary care doctor for further evaluation and management of his diabetic medication.  Patient is hypertensive but does not show any signs or symptoms of hypertensive crisis.  It is likely that his hypertension is secondary from not taking his morning blood pressure medications at his usual time.  Patient was given his morning dose which showed improvement in his blood pressure and was instructed to continue taking his usual dosage of his blood pressure medications.  Final Clinical Impression(s) / ED Diagnoses Final diagnoses:  Hypertensive urgency    Rx / DC Orders ED Discharge Orders    None       Marcello Fennel, PA-C 02/29/20 1149    Carmin Muskrat, MD 02/29/20 2130

## 2020-03-02 DIAGNOSIS — R262 Difficulty in walking, not elsewhere classified: Secondary | ICD-10-CM | POA: Diagnosis not present

## 2020-03-02 DIAGNOSIS — M6281 Muscle weakness (generalized): Secondary | ICD-10-CM | POA: Diagnosis not present

## 2020-03-02 DIAGNOSIS — G92 Toxic encephalopathy: Secondary | ICD-10-CM | POA: Diagnosis not present

## 2020-03-02 DIAGNOSIS — R2689 Other abnormalities of gait and mobility: Secondary | ICD-10-CM | POA: Diagnosis not present

## 2020-03-07 DIAGNOSIS — G92 Toxic encephalopathy: Secondary | ICD-10-CM | POA: Diagnosis not present

## 2020-03-07 DIAGNOSIS — R262 Difficulty in walking, not elsewhere classified: Secondary | ICD-10-CM | POA: Diagnosis not present

## 2020-03-07 DIAGNOSIS — R2689 Other abnormalities of gait and mobility: Secondary | ICD-10-CM | POA: Diagnosis not present

## 2020-03-07 DIAGNOSIS — M6281 Muscle weakness (generalized): Secondary | ICD-10-CM | POA: Diagnosis not present

## 2020-03-09 DIAGNOSIS — R262 Difficulty in walking, not elsewhere classified: Secondary | ICD-10-CM | POA: Diagnosis not present

## 2020-03-09 DIAGNOSIS — G92 Toxic encephalopathy: Secondary | ICD-10-CM | POA: Diagnosis not present

## 2020-03-09 DIAGNOSIS — R2689 Other abnormalities of gait and mobility: Secondary | ICD-10-CM | POA: Diagnosis not present

## 2020-03-09 DIAGNOSIS — M6281 Muscle weakness (generalized): Secondary | ICD-10-CM | POA: Diagnosis not present

## 2020-03-16 DIAGNOSIS — M6281 Muscle weakness (generalized): Secondary | ICD-10-CM | POA: Diagnosis not present

## 2020-03-16 DIAGNOSIS — R2689 Other abnormalities of gait and mobility: Secondary | ICD-10-CM | POA: Diagnosis not present

## 2020-03-16 DIAGNOSIS — G92 Toxic encephalopathy: Secondary | ICD-10-CM | POA: Diagnosis not present

## 2020-03-16 DIAGNOSIS — R262 Difficulty in walking, not elsewhere classified: Secondary | ICD-10-CM | POA: Diagnosis not present

## 2020-03-19 DIAGNOSIS — N184 Chronic kidney disease, stage 4 (severe): Secondary | ICD-10-CM | POA: Diagnosis not present

## 2020-03-19 DIAGNOSIS — G9340 Encephalopathy, unspecified: Secondary | ICD-10-CM | POA: Diagnosis not present

## 2020-03-19 DIAGNOSIS — I10 Essential (primary) hypertension: Secondary | ICD-10-CM | POA: Diagnosis not present

## 2020-03-19 DIAGNOSIS — E1142 Type 2 diabetes mellitus with diabetic polyneuropathy: Secondary | ICD-10-CM | POA: Diagnosis not present

## 2020-03-22 DIAGNOSIS — J42 Unspecified chronic bronchitis: Secondary | ICD-10-CM | POA: Diagnosis not present

## 2020-03-22 DIAGNOSIS — R0602 Shortness of breath: Secondary | ICD-10-CM | POA: Diagnosis not present

## 2020-03-26 DIAGNOSIS — E1129 Type 2 diabetes mellitus with other diabetic kidney complication: Secondary | ICD-10-CM | POA: Diagnosis not present

## 2020-03-26 DIAGNOSIS — E1122 Type 2 diabetes mellitus with diabetic chronic kidney disease: Secondary | ICD-10-CM | POA: Diagnosis not present

## 2020-03-26 DIAGNOSIS — D631 Anemia in chronic kidney disease: Secondary | ICD-10-CM | POA: Diagnosis not present

## 2020-03-26 DIAGNOSIS — R809 Proteinuria, unspecified: Secondary | ICD-10-CM | POA: Diagnosis not present

## 2020-03-26 DIAGNOSIS — N189 Chronic kidney disease, unspecified: Secondary | ICD-10-CM | POA: Diagnosis not present

## 2020-03-28 DIAGNOSIS — D631 Anemia in chronic kidney disease: Secondary | ICD-10-CM | POA: Diagnosis not present

## 2020-03-28 DIAGNOSIS — N185 Chronic kidney disease, stage 5: Secondary | ICD-10-CM | POA: Diagnosis not present

## 2020-03-28 DIAGNOSIS — I129 Hypertensive chronic kidney disease with stage 1 through stage 4 chronic kidney disease, or unspecified chronic kidney disease: Secondary | ICD-10-CM | POA: Diagnosis not present

## 2020-03-28 DIAGNOSIS — E211 Secondary hyperparathyroidism, not elsewhere classified: Secondary | ICD-10-CM | POA: Diagnosis not present

## 2020-03-28 DIAGNOSIS — R809 Proteinuria, unspecified: Secondary | ICD-10-CM | POA: Diagnosis not present

## 2020-03-30 DIAGNOSIS — G92 Toxic encephalopathy: Secondary | ICD-10-CM | POA: Diagnosis not present

## 2020-03-30 DIAGNOSIS — R2689 Other abnormalities of gait and mobility: Secondary | ICD-10-CM | POA: Diagnosis not present

## 2020-03-30 DIAGNOSIS — R262 Difficulty in walking, not elsewhere classified: Secondary | ICD-10-CM | POA: Diagnosis not present

## 2020-03-30 DIAGNOSIS — M6281 Muscle weakness (generalized): Secondary | ICD-10-CM | POA: Diagnosis not present

## 2020-04-11 DIAGNOSIS — R269 Unspecified abnormalities of gait and mobility: Secondary | ICD-10-CM | POA: Diagnosis not present

## 2020-04-11 DIAGNOSIS — G8314 Monoplegia of lower limb affecting left nondominant side: Secondary | ICD-10-CM | POA: Diagnosis not present

## 2020-04-11 DIAGNOSIS — E1142 Type 2 diabetes mellitus with diabetic polyneuropathy: Secondary | ICD-10-CM | POA: Diagnosis not present

## 2020-04-11 DIAGNOSIS — G959 Disease of spinal cord, unspecified: Secondary | ICD-10-CM | POA: Diagnosis not present

## 2020-04-17 ENCOUNTER — Other Ambulatory Visit: Payer: Self-pay

## 2020-04-17 ENCOUNTER — Emergency Department (HOSPITAL_COMMUNITY): Payer: Medicare Other

## 2020-04-17 ENCOUNTER — Observation Stay (HOSPITAL_COMMUNITY)
Admission: EM | Admit: 2020-04-17 | Discharge: 2020-04-18 | Disposition: A | Discharge status: Home / Self Care | Payer: Medicare Other | Attending: Internal Medicine | Admitting: Internal Medicine

## 2020-04-17 ENCOUNTER — Encounter (HOSPITAL_COMMUNITY): Payer: Self-pay | Admitting: Emergency Medicine

## 2020-04-17 DIAGNOSIS — Z20822 Contact with and (suspected) exposure to covid-19: Secondary | ICD-10-CM | POA: Diagnosis not present

## 2020-04-17 DIAGNOSIS — M4712 Other spondylosis with myelopathy, cervical region: Secondary | ICD-10-CM

## 2020-04-17 DIAGNOSIS — N189 Chronic kidney disease, unspecified: Secondary | ICD-10-CM

## 2020-04-17 DIAGNOSIS — R0789 Other chest pain: Secondary | ICD-10-CM | POA: Diagnosis not present

## 2020-04-17 DIAGNOSIS — N184 Chronic kidney disease, stage 4 (severe): Secondary | ICD-10-CM | POA: Diagnosis not present

## 2020-04-17 DIAGNOSIS — E785 Hyperlipidemia, unspecified: Secondary | ICD-10-CM | POA: Diagnosis present

## 2020-04-17 DIAGNOSIS — K219 Gastro-esophageal reflux disease without esophagitis: Secondary | ICD-10-CM | POA: Diagnosis not present

## 2020-04-17 DIAGNOSIS — Z87891 Personal history of nicotine dependence: Secondary | ICD-10-CM | POA: Diagnosis not present

## 2020-04-17 DIAGNOSIS — E1122 Type 2 diabetes mellitus with diabetic chronic kidney disease: Secondary | ICD-10-CM | POA: Diagnosis not present

## 2020-04-17 DIAGNOSIS — R778 Other specified abnormalities of plasma proteins: Secondary | ICD-10-CM | POA: Diagnosis present

## 2020-04-17 DIAGNOSIS — M7989 Other specified soft tissue disorders: Secondary | ICD-10-CM | POA: Insufficient documentation

## 2020-04-17 DIAGNOSIS — I129 Hypertensive chronic kidney disease with stage 1 through stage 4 chronic kidney disease, or unspecified chronic kidney disease: Secondary | ICD-10-CM | POA: Diagnosis not present

## 2020-04-17 DIAGNOSIS — I10 Essential (primary) hypertension: Secondary | ICD-10-CM | POA: Diagnosis present

## 2020-04-17 DIAGNOSIS — Z8546 Personal history of malignant neoplasm of prostate: Secondary | ICD-10-CM | POA: Diagnosis not present

## 2020-04-17 DIAGNOSIS — E1169 Type 2 diabetes mellitus with other specified complication: Secondary | ICD-10-CM | POA: Diagnosis present

## 2020-04-17 DIAGNOSIS — E1129 Type 2 diabetes mellitus with other diabetic kidney complication: Secondary | ICD-10-CM | POA: Diagnosis present

## 2020-04-17 DIAGNOSIS — R519 Headache, unspecified: Secondary | ICD-10-CM | POA: Diagnosis not present

## 2020-04-17 DIAGNOSIS — R5381 Other malaise: Secondary | ICD-10-CM | POA: Diagnosis not present

## 2020-04-17 DIAGNOSIS — R079 Chest pain, unspecified: Secondary | ICD-10-CM

## 2020-04-17 DIAGNOSIS — N179 Acute kidney failure, unspecified: Secondary | ICD-10-CM | POA: Insufficient documentation

## 2020-04-17 DIAGNOSIS — R7989 Other specified abnormal findings of blood chemistry: Secondary | ICD-10-CM | POA: Diagnosis present

## 2020-04-17 LAB — TROPONIN I (HIGH SENSITIVITY)
Troponin I (High Sensitivity): 16 ng/L (ref <18)
Troponin I (High Sensitivity): 18 ng/L — ABNORMAL HIGH (ref <18)

## 2020-04-17 LAB — CBC
HCT: 27.4 % — ABNORMAL LOW (ref 39.0–52.0)
Hemoglobin: 8.9 g/dL — ABNORMAL LOW (ref 13.0–17.0)
MCH: 26.9 pg (ref 26.0–34.0)
MCHC: 32.5 g/dL (ref 30.0–36.0)
MCV: 82.8 fL (ref 80.0–100.0)
Platelets: 183 10*3/uL (ref 150–400)
RBC: 3.31 MIL/uL — ABNORMAL LOW (ref 4.22–5.81)
RDW: 13.8 % (ref 11.5–15.5)
WBC: 5.5 10*3/uL (ref 4.0–10.5)
nRBC: 0 % (ref 0.0–0.2)

## 2020-04-17 LAB — BASIC METABOLIC PANEL
Anion gap: 11 (ref 5–15)
BUN: 45 mg/dL — ABNORMAL HIGH (ref 8–23)
CO2: 21 mmol/L — ABNORMAL LOW (ref 22–32)
Calcium: 8.8 mg/dL — ABNORMAL LOW (ref 8.9–10.3)
Chloride: 104 mmol/L (ref 98–111)
Creatinine, Ser: 4.56 mg/dL — ABNORMAL HIGH (ref 0.61–1.24)
GFR calc Af Amer: 13 mL/min — ABNORMAL LOW (ref >60)
GFR calc non Af Amer: 11 mL/min — ABNORMAL LOW (ref >60)
Glucose, Bld: 117 mg/dL — ABNORMAL HIGH (ref 70–99)
Potassium: 4.1 mmol/L (ref 3.5–5.1)
Sodium: 136 mmol/L (ref 135–145)

## 2020-04-17 MED ORDER — SODIUM CHLORIDE 0.9% FLUSH: 3.0000 mL | Freq: Once | INTRAVENOUS | Status: DC

## 2020-04-17 NOTE — ED Provider Notes (Signed)
[facility] Emergency Department Provider Note MRN:  [id number]  Arrival date & time: 04/18/20     Chief Complaint   Chest Pain   History of Present Illness   Steve [name] is a 84 y.o. year-old male with a history of diabetes, CKD, hypertension presenting to the ED with chief complaint of chest pain.  3 days of general malaise, chest pain, headaches.  Symptoms occurring intermittently.  Chest pain is left-sided, associated with shortness of breath.  No known exacerbating or alleviating factors.  Does not normally get headaches.  Denies nausea or vomiting, no abdominal pain, no new numbness or weakness to the arms or legs.  Review of Systems  A complete 10 system review of systems was obtained and all systems are negative except as noted in the HPI and PMH.   Patient's Health History    Past Medical History:  Diagnosis Date  . Arthritis   . CKD (chronic kidney disease) stage 4, GFR 15-29 ml/min (HCC)   . Essential hypertension   . Hyperlipidemia   . Prostate cancer ([place])   . Renal insufficiency   . Type 2 diabetes mellitus ([place])     Past Surgical History:  Procedure Laterality Date  . CATARACT EXTRACTION W/PHACO Right 04/28/2016   Procedure: CATARACT EXTRACTION PHACO AND INTRAOCULAR LENS PLACEMENT (IOC);  Surgeon: Williams Che, MD;  Location: [facility];  Service: Ophthalmology;  Laterality: Right;  CDE: 4.28  . COLONOSCOPY     about 2 years in [place]   . CYSTECTOMY      Family History  Problem Relation Age of Onset  . Heart disease Mother        cause of death  . Diabetes Sister   . Colon cancer Neg Hx     Social History   Socioeconomic History  . Marital status: Divorced    Spouse name: Not on file  . Number of children: Not on file  . Years of education: Not on file  . Highest education level: Not on file  Occupational History  . Occupation: Retired  Tobacco Use  . Smoking status: Former Smoker    Packs/day: 2.00    Years: 25.00     Pack years: 50.00    Types: Pipe, Cigars    Start date: 11/08/1954    Quit date: 10/14/1987    Years since quitting: 32.5  . Smokeless tobacco: Never Used  . Tobacco comment: About 2 pipes a day  Vaping Use  . Vaping Use: Never used  Substance and Sexual Activity  . Alcohol use: No    Alcohol/week: 0.0 standard drinks  . Drug use: No  . Sexual activity: Not on file  Other Topics Concern  . Not on file  Social History Narrative  . Not on file   Social Determinants of Health   Financial Resource Strain:   . Difficulty of Paying Living Expenses:   Food Insecurity:   . Worried About Charity fundraiser in the Last Year:   . Arboriculturist in the Last Year:   Transportation Needs:   . Film/video editor (Medical):   Marland Kitchen Lack of Transportation (Non-Medical):   Physical Activity:   . Days of Exercise per Week:   . Minutes of Exercise per Session:   Stress:   . Feeling of Stress :   Social Connections:   . Frequency of Communication with Friends and Family:   . Frequency of Social Gatherings with Friends and Family:   .  Attends Religious Services:   . Active Member of Clubs or Organizations:   . Attends Archivist Meetings:   Marland Kitchen Marital Status:   Intimate Partner Violence:   . Fear of Current or Ex-Partner:   . Emotionally Abused:   Marland Kitchen Physically Abused:   . Sexually Abused:      Physical Exam   Vitals:   04/17/20 2230 04/17/20 2300  BP: (!) 177/79 (!) 167/79  Pulse: 76 69  Resp: 19 19  Temp:    SpO2: 100% 100%    CONSTITUTIONAL: Well-appearing, NAD NEURO:  Alert and oriented x 3, no focal deficits EYES:  eyes equal and reactive ENT/NECK:  no LAD, no JVD CARDIO: Regular rate, well-perfused, normal S1 and S2 PULM:  CTAB no wheezing or rhonchi GI/GU:  normal bowel sounds, non-distended, non-tender MSK/SPINE:  No gross deformities, symmetric 1+ pitting edema to the bilateral lower extremities SKIN:  no rash, atraumatic PSYCH:  Appropriate speech and  behavior  *Additional and/or pertinent findings included in MDM below  Diagnostic and Interventional Summary    EKG Interpretation  Date/Time:  Tuesday April 17 2020 19:58:04 EDT Ventricular Rate:  76 PR Interval:  178 QRS Duration: 96 QT Interval:  384 QTC Calculation: 432 R Axis:   -42 Text Interpretation: Sinus rhythm with Premature atrial complexes in a pattern of bigeminy Left axis deviation Nonspecific ST and T wave abnormality Abnormal ECG No significant change since prior 5/21 Confirmed by Aletta Edouard [phone]) on 04/17/2020 8:17:16 PM      Labs Reviewed  BASIC METABOLIC PANEL - Abnormal; Notable for the following components:      Result Value   CO2 21 (*)    Glucose, Bld 117 (*)    BUN 45 (*)    Creatinine, Ser 4.56 (*)    Calcium 8.8 (*)    GFR calc non Af Amer 11 (*)    GFR calc Af Amer 13 (*)    All other components within normal limits  CBC - Abnormal; Notable for the following components:   RBC 3.31 (*)    Hemoglobin 8.9 (*)    HCT 27.4 (*)    All other components within normal limits  BRAIN NATRIURETIC PEPTIDE - Abnormal; Notable for the following components:   B Natriuretic Peptide 204.0 (*)    All other components within normal limits  TROPONIN I (HIGH SENSITIVITY) - Abnormal; Notable for the following components:   Troponin I (High Sensitivity) 18 (*)    All other components within normal limits  SARS CORONAVIRUS 2 BY RT PCR (HOSPITAL ORDER, [place] [facility])  TROPONIN I (HIGH SENSITIVITY)    CT HEAD WO CONTRAST  Final Result    DG Chest 2 View  Final Result      Medications  sodium chloride flush (NS) 0.9 % injection 3 mL (has no administration in time range)  aspirin chewable tablet 324 mg (has no administration in time range)     Procedures  /  Critical Care Procedures  ED Course and Medical Decision Making  I have reviewed the triage vital signs, the nursing notes, and pertinent available records from the  EMR.  Listed above are laboratory and imaging tests that I personally ordered, reviewed, and interpreted and then considered in my medical decision making (see below for details).      Considering ACS, hypertensive urgency, less concern for PE.  EKG is largely unchanged, troponins are mildly elevated, seems to have worsening renal function.  Awaiting  CT head given his new headaches in the setting of high blood pressure.  Would consider admission for blood pressure control and chest pain rule out.  Remainder of work-up reveals nonacute CT head, mildly elevated BNP.  Patient would benefit from admission for trending of troponins, blood pressure management, and likely repeat echo to evaluate for signs of reduced EF.  Barth Kirks. Sedonia Small, MD [place] [email or web address]  Final Clinical Impressions(s) / ED Diagnoses     ICD-10-CM   1. Chest pain, unspecified type  R07.9   2. Hypertension, unspecified type  I10   3. Leg swelling  M79.89     ED Discharge Orders    None       Discharge Instructions Discussed with and Provided to Patient:   Discharge Instructions   None       Maudie Flakes, MD 04/18/20 [phone]

## 2020-04-17 NOTE — ED Triage Notes (Signed)
Pt c/o chest pain and "just not feeling good" x 3 days.

## 2020-04-18 ENCOUNTER — Observation Stay (HOSPITAL_COMMUNITY): Payer: Medicare Other

## 2020-04-18 ENCOUNTER — Observation Stay (HOSPITAL_BASED_OUTPATIENT_CLINIC_OR_DEPARTMENT_OTHER): Payer: Medicare Other

## 2020-04-18 DIAGNOSIS — E785 Hyperlipidemia, unspecified: Secondary | ICD-10-CM

## 2020-04-18 DIAGNOSIS — I361 Nonrheumatic tricuspid (valve) insufficiency: Secondary | ICD-10-CM

## 2020-04-18 DIAGNOSIS — R079 Chest pain, unspecified: Secondary | ICD-10-CM

## 2020-04-18 DIAGNOSIS — I10 Essential (primary) hypertension: Secondary | ICD-10-CM | POA: Diagnosis not present

## 2020-04-18 DIAGNOSIS — E1122 Type 2 diabetes mellitus with diabetic chronic kidney disease: Secondary | ICD-10-CM | POA: Diagnosis not present

## 2020-04-18 DIAGNOSIS — I5032 Chronic diastolic (congestive) heart failure: Secondary | ICD-10-CM

## 2020-04-18 DIAGNOSIS — N184 Chronic kidney disease, stage 4 (severe): Secondary | ICD-10-CM

## 2020-04-18 DIAGNOSIS — R0789 Other chest pain: Secondary | ICD-10-CM | POA: Diagnosis present

## 2020-04-18 DIAGNOSIS — R778 Other specified abnormalities of plasma proteins: Secondary | ICD-10-CM

## 2020-04-18 DIAGNOSIS — I340 Nonrheumatic mitral (valve) insufficiency: Secondary | ICD-10-CM | POA: Diagnosis not present

## 2020-04-18 DIAGNOSIS — N179 Acute kidney failure, unspecified: Secondary | ICD-10-CM | POA: Diagnosis not present

## 2020-04-18 DIAGNOSIS — N189 Chronic kidney disease, unspecified: Secondary | ICD-10-CM

## 2020-04-18 DIAGNOSIS — Z794 Long term (current) use of insulin: Secondary | ICD-10-CM

## 2020-04-18 DIAGNOSIS — K219 Gastro-esophageal reflux disease without esophagitis: Secondary | ICD-10-CM

## 2020-04-18 LAB — GLUCOSE, CAPILLARY
Glucose-Capillary: 123 mg/dL — ABNORMAL HIGH (ref 70–99)
Glucose-Capillary: 117 mg/dL — ABNORMAL HIGH (ref 70–99)
Glucose-Capillary: 136 mg/dL — ABNORMAL HIGH (ref 70–99)

## 2020-04-18 LAB — ECHOCARDIOGRAM COMPLETE
Height: 65 in
Weight: 2720 oz

## 2020-04-18 LAB — COMPREHENSIVE METABOLIC PANEL
ALT: 13 U/L (ref 0–44)
AST: 16 U/L (ref 15–41)
Albumin: 3.7 g/dL (ref 3.5–5.0)
Alkaline Phosphatase: 56 U/L (ref 38–126)
Anion gap: 9 (ref 5–15)
BUN: 45 mg/dL — ABNORMAL HIGH (ref 8–23)
CO2: 22 mmol/L (ref 22–32)
Calcium: 8.7 mg/dL — ABNORMAL LOW (ref 8.9–10.3)
Chloride: 106 mmol/L (ref 98–111)
Creatinine, Ser: 4.43 mg/dL — ABNORMAL HIGH (ref 0.61–1.24)
GFR calc Af Amer: 13 mL/min — ABNORMAL LOW (ref >60)
GFR calc non Af Amer: 12 mL/min — ABNORMAL LOW (ref >60)
Glucose, Bld: 136 mg/dL — ABNORMAL HIGH (ref 70–99)
Potassium: 4 mmol/L (ref 3.5–5.1)
Sodium: 137 mmol/L (ref 135–145)
Total Bilirubin: 0.6 mg/dL (ref 0.3–1.2)
Total Protein: 7.1 g/dL (ref 6.5–8.1)

## 2020-04-18 LAB — BRAIN NATRIURETIC PEPTIDE: B Natriuretic Peptide: 204 pg/mL — ABNORMAL HIGH (ref 0.0–100.0)

## 2020-04-18 LAB — PHOSPHORUS: Phosphorus: 3.6 mg/dL (ref 2.5–4.6)

## 2020-04-18 LAB — APTT: aPTT: 34 seconds (ref 24–36)

## 2020-04-18 LAB — CBC
HCT: 29 % — ABNORMAL LOW (ref 39.0–52.0)
Hemoglobin: 9.4 g/dL — ABNORMAL LOW (ref 13.0–17.0)
MCH: 26.6 pg (ref 26.0–34.0)
MCHC: 32.4 g/dL (ref 30.0–36.0)
MCV: 81.9 fL (ref 80.0–100.0)
Platelets: 192 10*3/uL (ref 150–400)
RBC: 3.54 MIL/uL — ABNORMAL LOW (ref 4.22–5.81)
RDW: 13.8 % (ref 11.5–15.5)
WBC: 5.9 10*3/uL (ref 4.0–10.5)
nRBC: 0 % (ref 0.0–0.2)

## 2020-04-18 LAB — MAGNESIUM: Magnesium: 2 mg/dL (ref 1.7–2.4)

## 2020-04-18 LAB — PROTIME-INR
INR: 1.1 (ref 0.8–1.2)
Prothrombin Time: 13.7 seconds (ref 11.4–15.2)

## 2020-04-18 LAB — HEMOGLOBIN A1C
Hgb A1c MFr Bld: 6.3 % — ABNORMAL HIGH (ref 4.8–5.6)
Mean Plasma Glucose: 134.11 mg/dL

## 2020-04-18 LAB — SARS CORONAVIRUS 2 BY RT PCR (HOSPITAL ORDER, PERFORMED IN ~~LOC~~ HOSPITAL LAB): SARS Coronavirus 2: NEGATIVE

## 2020-04-18 MED ORDER — METOPROLOL TARTRATE 50 MG PO TABS
25.0000 mg | ORAL_TABLET | Freq: Two times a day (BID) | ORAL | Status: DC
  Administered 2020-04-18: 25 mg via ORAL
  Filled 2020-04-18 (×2): qty 1

## 2020-04-18 MED ORDER — ISOSORBIDE MONONITRATE ER 60 MG PO TB24
30.0000 mg | ORAL_TABLET | Freq: Every day | ORAL | Status: DC
  Administered 2020-04-18: 30 mg via ORAL
  Filled 2020-04-18: qty 1

## 2020-04-18 MED ORDER — ISOSORBIDE MONONITRATE ER 30 MG PO TB24: 30.0000 mg | ORAL_TABLET | Freq: Every day | ORAL | 1 refills | Status: DC

## 2020-04-18 MED ORDER — METOPROLOL TARTRATE 25 MG PO TABS: 25.0000 mg | ORAL_TABLET | Freq: Two times a day (BID) | ORAL | Status: DC

## 2020-04-18 MED ORDER — TAMSULOSIN HCL 0.4 MG PO CAPS
0.4000 mg | ORAL_CAPSULE | Freq: Every day | ORAL | Status: DC
  Administered 2020-04-18: 0.4 mg via ORAL
  Filled 2020-04-18: qty 1

## 2020-04-18 MED ORDER — INSULIN ASPART 100 UNIT/ML ~~LOC~~ SOLN: 0.0000 [IU] | Freq: Every day | SUBCUTANEOUS | Status: DC

## 2020-04-18 MED ORDER — INSULIN ASPART 100 UNIT/ML ~~LOC~~ SOLN
0.0000 [IU] | Freq: Three times a day (TID) | SUBCUTANEOUS | Status: DC
  Administered 2020-04-18 (×2): 1 [IU] via SUBCUTANEOUS

## 2020-04-18 MED ORDER — TRAMADOL HCL 50 MG PO TABS: 50.0000 mg | ORAL_TABLET | Freq: Two times a day (BID) | ORAL | Status: DC | PRN

## 2020-04-18 MED ORDER — PRAVASTATIN SODIUM 40 MG PO TABS
40.0000 mg | ORAL_TABLET | Freq: Every evening | ORAL | Status: DC
  Administered 2020-04-18: 40 mg via ORAL
  Filled 2020-04-18: qty 1

## 2020-04-18 MED ORDER — ASPIRIN 81 MG PO CHEW: 324.0000 mg | CHEWABLE_TABLET | Freq: Once | ORAL | Status: DC

## 2020-04-18 MED ORDER — PANTOPRAZOLE SODIUM 40 MG PO TBEC
40.0000 mg | DELAYED_RELEASE_TABLET | Freq: Every day | ORAL | Status: DC
  Administered 2020-04-18: 40 mg via ORAL
  Filled 2020-04-18: qty 1

## 2020-04-18 MED ORDER — NIFEDIPINE ER OSMOTIC RELEASE 30 MG PO TB24
90.0000 mg | ORAL_TABLET | Freq: Every day | ORAL | Status: DC
  Administered 2020-04-18: 90 mg via ORAL
  Filled 2020-04-18: qty 3

## 2020-04-18 MED ORDER — ASPIRIN 81 MG PO TABS: 81.0000 mg | ORAL_TABLET | Freq: Every day | ORAL | Status: DC

## 2020-04-18 MED ORDER — ALBUTEROL SULFATE HFA 108 (90 BASE) MCG/ACT IN AERS: 1.0000 | INHALATION_SPRAY | Freq: Four times a day (QID) | RESPIRATORY_TRACT | Status: DC | PRN

## 2020-04-18 MED ORDER — HYDRALAZINE HCL 25 MG PO TABS
50.0000 mg | ORAL_TABLET | Freq: Three times a day (TID) | ORAL | Status: DC
  Administered 2020-04-18: 50 mg via ORAL
  Filled 2020-04-18: qty 2

## 2020-04-18 MED ORDER — PANTOPRAZOLE SODIUM 40 MG PO TBEC: 40.0000 mg | DELAYED_RELEASE_TABLET | Freq: Every day | ORAL | 1 refills | Status: DC

## 2020-04-18 MED ORDER — HYDRALAZINE HCL 20 MG/ML IJ SOLN
10.0000 mg | Freq: Once | INTRAMUSCULAR | Status: AC
  Administered 2020-04-18: 10 mg via INTRAVENOUS
  Filled 2020-04-18: qty 1

## 2020-04-18 MED ORDER — ALBUTEROL SULFATE (2.5 MG/3ML) 0.083% IN NEBU: 2.5000 mg | INHALATION_SOLUTION | Freq: Four times a day (QID) | RESPIRATORY_TRACT | Status: DC | PRN

## 2020-04-18 MED ORDER — HEPARIN SODIUM (PORCINE) 5000 UNIT/ML IJ SOLN
5000.0000 [IU] | Freq: Three times a day (TID) | INTRAMUSCULAR | Status: DC
  Administered 2020-04-18: 5000 [IU] via SUBCUTANEOUS
  Filled 2020-04-18 (×2): qty 1

## 2020-04-18 MED ORDER — NITROGLYCERIN 0.4 MG SL SUBL: 0.4000 mg | SUBLINGUAL_TABLET | SUBLINGUAL | Status: DC | PRN

## 2020-04-18 MED ORDER — ASPIRIN EC 81 MG PO TBEC
81.0000 mg | DELAYED_RELEASE_TABLET | Freq: Every day | ORAL | Status: DC
  Administered 2020-04-18: 81 mg via ORAL
  Filled 2020-04-18: qty 1

## 2020-04-18 MED ORDER — FUROSEMIDE 20 MG PO TABS
20.0000 mg | ORAL_TABLET | Freq: Every day | ORAL | Status: DC
Start: 2020-04-22 — End: 2021-02-21

## 2020-04-18 NOTE — Discharge Summary (Signed)
Physician Discharge Summary  Steve [name] HAF:[id number] DOB: 05-02-1936 DOA: 04/17/2020  PCP: Rosita Fire, MD  Admit date: 04/17/2020 Discharge date: 04/18/2020  Time spent: 35 minutes  Recommendations for Outpatient Follow-up:  1. Repeat basic metabolic panel to follow electrolytes and renal function 2. Reassess blood pressure and further adjust antihypertensive regimen as needed.   Discharge Diagnoses:  Principal Problem:   Atypical chest pain Active Problems:   HTN (hypertension)   Elevated troponin   DM (diabetes mellitus), type 2 with renal complications (HCC)   Hyperlipidemia   Acute kidney injury superimposed on CKD (HCC)   Gastroesophageal reflux disease   Discharge Condition: Stable and improved.  Patient discharged home with instruction to follow-up with PCP: Cardiology service and nephrologist.  CODE STATUS: Full code.  Diet recommendation: Heart healthy and modified carbohydrate diet.  Filed Weights   04/17/20 1956  Weight: 77.1 kg    History of present illness:  As per H&P written by Dr. Josephine Cables 04/18/2020 Steve [name] is a 84 y.o. male with medical history significant for type II diabetes mellitus, hypertension, CKD stage IV who presents to the emergency department due to 3 day onset of generalized malaise, intermittent chest pain described as soreness on left anterior chest area, chest pain was nonreproducible, nonradiating and was with no exacerbating or elevating factor.  Patient also complained of headaches, but he denies fever, chills, nausea, vomiting or abdominal pain.  Of note, patient ambulates with a cane at baseline  ED Course: In the emergency department, BP was 161/74, but other vital signs are within normal range.  Work-up in the ED showed normal CBC, BUN to creatinine 45/4.56 (baseline creatinine was 3.78 3.78-3.83).  proBNP 204, troponin I 16>18.  CT without contrast showed no acute abnormality.  Chest x-ray showed no acute cardiopulmonary  abnormality.  Aspirin 324 mg p.o. x1 was given.  Hospitalist was asked to admit him for further evaluation and management.  Hospital Course:  1-chest pain: Atypical -Heart score 5 -No significant elevation in his troponin (especially for chronic renal failure patient) -EKG and telemetry without acute ischemic changes. -2D echo reassuring; no wall motion abnormalities, left ventricle hypertrophy, ejection fraction of 65 to 70% and grade 2 diastolic dysfunction. -Given patient renal failure and high risk for contrast-induced nephropathy no further ischemic work-up will be pursued while inpatient.  Plan is for medical management and outpatient follow-up. -Patient will continue the use of beta-blocker, hydralazine, Procardia, Imdur, aspirin and statin. -PPI once again added to assist with non cardiac culprit of symptoms.  2-hypertension -Marginally controlled -Continue hydralazine, Lopressor, nifedipine and daily Imdur -Advised to follow heart healthy diet.  3-hyperlipidemia -Continue statin -Heart healthy diet has been encouraged.  4-type 2 diabetes with nephropathy -Continue home hypoglycemic regimen and close monitoring -A1c 6.3  5-chronic kidney disease a stage IV-V -Continue outpatient follow-up with nephrology service -Advised to maintain adequate hydration. -Low-sodium diet encouraged.  6-chronic grade 2 diastolic dysfunction -Continue control patient's blood pressure -Daily weights -Advised to maintain adequate hydration and to proceed with low-sodium diet.  7-diabetic neuropathy -continue daily neuronitn .  8-GERD -continue PPI  Procedures: 2-D echo: Demonstrating grade 2 diastolic dysfunction, preserved ejection fraction, no wall motion abnormalities.  Consultations:  Cardiology service  Discharge Exam: Vitals:   04/18/20 1356 04/18/20 1501  BP: (!) 102/45 (!) 116/59  Pulse: (!) 56 (!) 57  Resp: 18 16  Temp: 98.7 F (37.1 C) 98.6 F (37 C)  SpO2: 100%  99%    General: No chest pain,  no nausea, no vomiting.  Patient ready to go home.  Patient is afebrile.  Denies palpitations. Cardiovascular: S1 and S2, no rubs, no gallops, no JVD. Respiratory: Good air movement bilaterally, no using accessory muscles. Abdomen: Soft, nontender, distended, positive bowel sounds Extremities: No cyanosis, no clubbing.  Positive trace edema.  Discharge Instructions   Discharge Instructions    (HEART FAILURE PATIENTS) Call MD:  Anytime you have any of the following symptoms: 1) 3 pound weight gain in 24 hours or 5 pounds in 1 week 2) shortness of breath, with or without a dry hacking cough 3) swelling in the hands, feet or stomach 4) if you have to sleep on extra pillows at night in order to breathe.   Complete by: As directed    Diet - low sodium heart healthy   Complete by: As directed    Discharge instructions   Complete by: As directed    Outpatient follow-up in 3 weeks with cardiology service Maintain adequate hydration Follow heart healthy diet Take medications as prescribed Follow-up with nephrology service (Dr. August Albino) in 10 days     Allergies as of 04/18/2020   No Known Allergies     Medication List    STOP taking these medications   HYDROcodone-acetaminophen 5-325 MG tablet Commonly known as: Norco   naproxen 375 MG tablet Commonly known as: NAPROSYN   predniSONE 10 MG tablet Commonly known as: DELTASONE   valACYclovir 1000 MG tablet Commonly known as: VALTREX     TAKE these medications   Accu-Chek Aviva Plus test strip Generic drug: glucose blood   OneTouch Verio test strip Generic drug: glucose blood 1 each 2 (two) times daily.   Accu-Chek Softclix Lancet Dev Kit   Accu-Chek Softclix Lancets lancets   albuterol 108 (90 Base) MCG/ACT inhaler Commonly known as: VENTOLIN HFA Inhale 1-2 puffs into the lungs every 6 (six) hours as needed for wheezing or shortness of breath.   aspirin 81 MG tablet Take 81 mg by mouth  daily.   furosemide 20 MG tablet Commonly known as: LASIX Take 1 tablet (20 mg total) by mouth daily. Start taking on: April 22, 2020 What changed:   These instructions start on April 22, 2020. If you are unsure what to do until then, ask your doctor or other care provider.  Another medication with the same name was removed. Continue taking this medication, and follow the directions you see here.   gabapentin 300 MG capsule Commonly known as: NEURONTIN Take 1 capsule by mouth daily.   hydrALAZINE 50 MG tablet Commonly known as: APRESOLINE Take 50 mg by mouth 3 (three) times daily.   isosorbide mononitrate 30 MG 24 hr tablet Commonly known as: IMDUR Take 1 tablet (30 mg total) by mouth daily. Start taking on: April 19, 2020   metoprolol tartrate 25 MG tablet Commonly known as: LOPRESSOR Take 1 tablet (25 mg total) by mouth 2 (two) times daily.   multivitamin with minerals Tabs tablet Take 1 tablet by mouth daily.   NIFEdipine 90 MG 24 hr tablet Commonly known as: PROCARDIA XL/NIFEDICAL-XL TAKE (1) TABLET BY MOUTH ONCE DAILY.   pantoprazole 40 MG tablet Commonly known as: PROTONIX Take 1 tablet (40 mg total) by mouth daily. Start taking on: April 19, 2020   pravastatin 40 MG tablet Commonly known as: PRAVACHOL Take 1 tablet by mouth every evening.   sodium bicarbonate 650 MG tablet Take 650 mg by mouth daily.   tamsulosin 0.4 MG Caps capsule Commonly known as:  FLOMAX Take 1 capsule by mouth daily.   Tradjenta 5 MG Tabs tablet Generic drug: linagliptin Take 5 mg by mouth daily.   traMADol 50 MG tablet Commonly known as: ULTRAM Take 1 tablet (50 mg total) by mouth every 12 (twelve) hours as needed for severe pain. What changed:   when to take this  reasons to take this   zolpidem 5 MG tablet Commonly known as: AMBIEN 5 mg at bedtime as needed.      No Known Allergies  Follow-up Information    Rosita Fire, MD. Schedule an appointment as soon as  possible for a visit in 10 day(s).   Specialty: Internal Medicine Contact information: [place] [address] [phone]        Erma Heritage, [place]. Schedule an appointment as soon as possible for a visit in 3 week(s).   Specialties: Physician Assistant, Cardiology Contact information: [place] [address] [phone]        Liana Gerold, MD. Schedule an appointment as soon as possible for a visit in 2 week(s).   Specialty: Nephrology Contact information: [address] [address] [phone]               The results of significant diagnostics from this hospitalization (including imaging, microbiology, ancillary and laboratory) are listed below for reference.    Significant Diagnostic Studies: DG Chest 2 View  Result Date: 04/17/2020 CLINICAL DATA:  Chest pain, feeling unwell for 3 days, history of diabetes, prostate cancer and former smoker the EXAM: CHEST - 2 VIEW COMPARISON:  Radiograph 02/29/2020, CT 07/25/2018 FINDINGS: Bandlike area of scarring in the right mid lung. Symmetric nipple shadows project over the chest bilaterally. Some streaky atelectatic changes are noted in the lung bases. No consolidation, features of edema, pneumothorax, or effusion. Borderline cardiomegaly. The aorta is calcified. The remaining cardiomediastinal contours are unremarkable. No acute osseous or soft tissue abnormality. IMPRESSION: 1. Chronic scarring and basilar atelectasis. 2. No acute cardiopulmonary abnormality. 3. Borderline cardiomegaly. Electronically Signed   By: Lovena Le M.D.   On: 04/17/2020 20:25   CT HEAD WO CONTRAST  Result Date: 04/18/2020 CLINICAL DATA:  Headaches and weakness EXAM: CT HEAD WITHOUT CONTRAST TECHNIQUE: Contiguous axial images were obtained from the base of the skull through the vertex without intravenous contrast. COMPARISON:  01/05/2020 FINDINGS: Brain: Mild chronic white matter  ischemic changes are noted. No findings to suggest acute hemorrhage, acute infarction or space-occupying mass lesion are seen. Vascular: No hyperdense vessel or unexpected calcification. Skull: Normal. Negative for fracture or focal lesion. Sinuses/Orbits: No acute finding. Other: None IMPRESSION: Chronic white matter ischemic changes without acute abnormality. Electronically Signed   By: Inez Catalina M.D.   On: 04/18/2020 00:37   MR CERVICAL SPINE WO CONTRAST  Result Date: 04/18/2020 CLINICAL DATA:  Other spondylosis with myelopathy, cervical region. Additional provided: Lower extremity edema, spondylosis with myelopathy, bilateral leg weakness. EXAM: MRI CERVICAL SPINE WITHOUT CONTRAST TECHNIQUE: Multiplanar, multisequence MR imaging of the cervical spine was performed. No intravenous contrast was administered. COMPARISON:  Report from cervical spine MRI 12/08/2008 (images unavailable). FINDINGS: Alignment: C3-C4 grade 1 retrolisthesis. Vertebrae: Vertebral body height is maintained. Multilevel degenerative endplate irregularity greatest at C3-C4. Additionally, there is mild degenerative endplate edema at this level. Cord: There is T2 hyperintense signal abnormality within the spinal cord at the C3-C4 level which may reflect myelomalacia and/or focal edema. No spinal cord signal abnormality is identified elsewhere within the cervical  spine. Posterior Fossa, vertebral arteries, paraspinal tissues: No abnormality identified within included portions of the posterior fossa. Flow voids preserved within the imaged cervical vertebral arteries. Paraspinal soft tissues within normal limits. Disc levels: Multilevel disc degeneration. Most notably, there is moderate/advanced disc degeneration at C3-C4 and C7-T1. C2-C3: Uncinate/facet hypertrophy (greater on the right). No significant spinal canal stenosis. Moderate right neural foraminal narrowing. C3-C4: Posterior disc osteophyte complex with bilateral disc osteophyte  ridge/uncinate hypertrophy. Facet/ligamentum flavum hypertrophy. Severe spinal canal stenosis with mild to moderate spinal cord flattening. Severe bilateral neural foraminal narrowing. C4-C5: Uncinate/facet hypertrophy. No significant spinal canal stenosis. Severe bilateral neural foraminal narrowing. C5-C6: Broad-based central disc protrusion eccentric to the left. Bilateral disc osteophyte ridge/uncinate hypertrophy. Facet/ligamentum flavum hypertrophy. Moderate spinal canal stenosis. There is contact upon the spinal cord with mild ventral spinal cord flattening. Severe bilateral neural foraminal narrowing. C6-C7: Posterior disc osteophyte complex. Left greater than right disc osteophyte ridge/uncinate hypertrophy. Facet/ligamentum flavum hypertrophy. Mild relative spinal canal narrowing. Severe bilateral neural foraminal narrowing. C7-T1: Posterior disc osteophyte complex. Right greater than left disc osteophyte ridge/uncinate hypertrophy. Facet hypertrophy. Mild relative spinal canal narrowing. Severe bilateral neural foraminal narrowing. IMPRESSION: Cervical spondylosis as outlined and most notably as follows. At C3-C4, there is grade 1 retrolisthesis. Moderate/advanced disc degeneration. Multifactorial severe spinal canal stenosis with mild to moderate spinal cord flattening. There is T2 hyperintense signal abnormality within the spinal cord at this level consistent with myelomalacia and/or focal edema. Severe bilateral neural foraminal narrowing. At C5-C6, there is multifactorial moderate spinal canal stenosis with mild spinal cord flattening. Severe bilateral neural foraminal narrowing. No more than mild spinal canal stenosis at the remaining levels. Additional sites of neural foraminal narrowing as detailed, including multiple additional sites of severe neural foraminal narrowing. Electronically Signed   By: Kellie Simmering DO   On: 04/18/2020 11:32   MR THORACIC SPINE WO CONTRAST  Result Date:  04/18/2020 CLINICAL DATA:  Other spondylosis with myelopathy, cervical region. Additional provided: Lower extremity edema, spondylosis with myelopathy, bilateral leg weakness. EXAM: MRI THORACIC SPINE WITHOUT CONTRAST TECHNIQUE: Multiplanar, multisequence MR imaging of the thoracic spine was performed. No intravenous contrast was administered. COMPARISON:  No pertinent prior studies available for comparison. FINDINGS: There is mild-to-moderate intermittent motion degradation. Alignment: Mild T10-T11 and T11-T12 grade 1 retrolisthesis. Vertebrae: Vertebral body height is maintained. Trace degenerative endplate marrow edema at T10-T11. No suspicious osseous lesion. Cord: Subtle T2 hyperintense signal abnormality is questioned within the right aspect of the spinal cord at the T3 level (series 9, image 7). No signal abnormality is identified elsewhere within the thoracic cord. Paraspinal and other soft tissues: No abnormality identified within included portions of the thorax or upper abdomen/retroperitoneum. Disc levels: Multilevel disc degeneration. Most notably there is moderate/advanced T2-T3 disc degeneration and moderate T11-T12 disc degeneration. There are multilevel disc bulges. Additionally, there is multilevel facet/ligamentum flavum hypertrophy, greatest at T2-T3. Additional notable level by level findings as below. At T2-T3, there is moderate/advanced disc degeneration. There is a disc bulge with superimposed central disc protrusion. Facet/ligamentum flavum hypertrophy. Contact upon the ventral spinal cord with mild spinal cord flattening. Mild/moderate bilateral neural foraminal narrowing (greater on the right). At T3-T4, there is a small left center disc protrusion which mildly effaces the ventral thecal sac contacting and mildly flattening the ventral spinal cord (series 8, image 8). At T9-T10, there is a central disc protrusion which partially effaces the ventral thecal sac and contacts the ventral spinal  cord without significant central canal stenosis. At  T10-T11, there is a central disc protrusion which partially effaces the ventral thecal sac and contacts the ventral spinal cord. Additionally, facet and ligamentum flavum hypertrophy contribute to mild/moderate bilateral neural foraminal narrowing At T11-T12, there is a central disc protrusion which partially effaces the ventral thecal sac and may contact the ventral spinal cord. IMPRESSION: Mild to moderately motion degraded examination, limiting evaluation. Subtle T2 hyperintense signal abnormality is questioned within the right aspect of the spinal cord at the T3 level, and may reflect focal edema, myelomalacia or artifact. This finding is subtle and not definite. Thoracic spondylosis as outlined and most notably as follows. At T2-T3, there is moderate/advanced disc space narrowing with disc bulge and central disc protrusion. Facet/ligamentum flavum hypertrophy. Moderate spinal canal stenosis. The disc protrusion contacts the ventral spinal cord with minimal flattening of the ventral cord. Mild/moderate bilateral neural foraminal narrowing. There are additional levels where small disc protrusions contact the ventral spinal cord. However, no more than mild canal stenosis at these remaining levels. Mild/moderate bilateral T10-T11 neural foraminal narrowing. Electronically Signed   By: Kellie Simmering DO   On: 04/18/2020 11:25   ECHOCARDIOGRAM COMPLETE  Result Date: 04/18/2020    ECHOCARDIOGRAM REPORT   Patient Name:   Steve [name] Date of Exam: 04/18/2020 Medical Rec #:  [id number]      Height:       65.0 in Accession #:    [id number]     Weight:       170.0 lb Date of Birth:  [birth year]      BSA:          1.846 m Patient Age:    84 years       BP:           184/93 mmHg Patient Gender: M              HR:           89 bpm. Exam Location:  [facility] Procedure: 2D Echo, Cardiac Doppler and Color Doppler Indications:    Chest Pain 786.50 / R07.9  History:         Patient has prior history of Echocardiogram examinations, most                 recent 07/25/2018. Signs/Symptoms:Shortness of Breath; Risk                 Factors:Hypertension, Diabetes and Dyslipidemia. CKD (chronic                 kidney disease) stage 4, GFR 15-29 ml/min (HCC) (From Hx).  Sonographer:    Alvino Chapel RCS Referring Phys: [place]  1. Left ventricular ejection fraction, by estimation, is 65 to 70%. The left ventricle has normal function. The left ventricle has no regional wall motion abnormalities. There is severe left ventricular hypertrophy. Left ventricular diastolic parameters  are consistent with Grade II diastolic dysfunction (pseudonormalization).  2. Right ventricular systolic function is normal. The right ventricular size is normal. There is moderately elevated pulmonary artery systolic pressure. The estimated right ventricular systolic pressure is 35.0 mmHg.  3. Left atrial size was moderately dilated.  4. Right atrial size was moderately dilated.  5. The mitral valve is grossly normal. Mild to moderate mitral valve regurgitation.  6. The aortic valve is tricuspid. Aortic valve regurgitation is not visualized. Mild to moderate aortic valve sclerosis/calcification is present, without any evidence of aortic stenosis.  7. The inferior vena cava is normal  in size with greater than 50% respiratory variability, suggesting right atrial pressure of 3 mmHg. FINDINGS  Left Ventricle: Left ventricular ejection fraction, by estimation, is 65 to 70%. The left ventricle has normal function. The left ventricle has no regional wall motion abnormalities. The left ventricular internal cavity size was normal in size. There is  severe left ventricular hypertrophy. Left ventricular diastolic parameters are consistent with Grade II diastolic dysfunction (pseudonormalization). Right Ventricle: The right ventricular size is normal. No increase in right ventricular wall thickness. Right  ventricular systolic function is normal. There is moderately elevated pulmonary artery systolic pressure. The tricuspid regurgitant velocity is 3.31 m/s, and with an assumed right atrial pressure of 3 mmHg, the estimated right ventricular systolic pressure is 73.5 mmHg. Left Atrium: Left atrial size was moderately dilated. Right Atrium: Right atrial size was moderately dilated. Pericardium: There is no evidence of pericardial effusion. Mitral Valve: The mitral valve is grossly normal. Mild mitral annular calcification. Mild to moderate mitral valve regurgitation. Tricuspid Valve: The tricuspid valve is grossly normal. Tricuspid valve regurgitation is mild. Aortic Valve: The aortic valve is tricuspid. Aortic valve regurgitation is not visualized. Mild to moderate aortic valve sclerosis/calcification is present, without any evidence of aortic stenosis. Mild aortic valve annular calcification. Pulmonic Valve: The pulmonic valve was grossly normal. Pulmonic valve regurgitation is mild. Aorta: The aortic root is normal in size and structure. Venous: The inferior vena cava is normal in size with greater than 50% respiratory variability, suggesting right atrial pressure of 3 mmHg. IAS/Shunts: No atrial level shunt detected by color flow Doppler.  LEFT VENTRICLE PLAX 2D LVIDd:         3.73 cm  Diastology LVIDs:         2.03 cm  LV e' lateral:   8.59 cm/s LV PW:         1.79 cm  LV E/e' lateral: 14.3 LV IVS:        1.66 cm  LV e' medial:    7.40 cm/s LVOT diam:     2.00 cm  LV E/e' medial:  16.6 LV SV:         98 LV SV Index:   53 LVOT Area:     3.14 cm  RIGHT VENTRICLE RV S prime:     12.50 cm/s TAPSE (M-mode): 2.4 cm LEFT ATRIUM             Index       RIGHT ATRIUM           Index LA diam:        3.70 cm 2.00 cm/m  RA Area:     23.80 cm LA Vol (A2C):   89.5 ml 48.48 ml/m RA Volume:   78.90 ml  42.74 ml/m LA Vol (A4C):   75.9 ml 41.11 ml/m LA Biplane Vol: 85.9 ml 46.53 ml/m  AORTIC VALVE LVOT Vmax:   153.00 cm/s LVOT  Vmean:  88.900 cm/s LVOT VTI:    0.313 m  AORTA Ao Root diam: 3.40 cm MITRAL VALVE                TRICUSPID VALVE MV Area (PHT): 3.99 cm     TR Peak grad:   43.8 mmHg MV Decel Time: 190 msec     TR Vmax:        331.00 cm/s MV E velocity: 123.00 cm/s MV A velocity: 113.00 cm/s  SHUNTS MV E/A ratio:  1.09         Systemic  VTI:  0.31 m                             Systemic Diam: 2.00 cm Rozann Lesches MD Electronically signed by Rozann Lesches MD Signature Date/Time: 04/18/2020/2:03:33 PM    Final     Microbiology: Recent Results (from the past 240 hour(s))  SARS Coronavirus 2 by RT PCR (hospital order, performed in [facility] hospital lab) Nasopharyngeal Nasopharyngeal Swab     Status: None   Collection Time: 04/18/20  1:38 AM   Specimen: Nasopharyngeal Swab  Result Value Ref Range Status   SARS Coronavirus 2 NEGATIVE NEGATIVE Final    Comment: (NOTE) SARS-CoV-2 target nucleic acids are NOT DETECTED.  The SARS-CoV-2 RNA is generally detectable in upper and lower respiratory specimens during the acute phase of infection. The lowest concentration of SARS-CoV-2 viral copies this assay can detect is 250 copies / mL. A negative result does not preclude SARS-CoV-2 infection and should not be used as the sole basis for treatment or other patient management decisions.  A negative result may occur with improper specimen collection / handling, submission of specimen other than nasopharyngeal swab, presence of viral mutation(s) within the areas targeted by this assay, and inadequate number of viral copies (<250 copies / mL). A negative result must be combined with clinical observations, patient history, and epidemiological information.  Fact Sheet for Patients:   StrictlyIdeas.no  Fact Sheet for Healthcare Providers: BankingDealers.co.za  This test is not yet approved or  cleared by the [place] FDA and has been authorized for detection and/or  diagnosis of SARS-CoV-2 by FDA under an Emergency Use Authorization (EUA).  This EUA will remain in effect (meaning this test can be used) for the duration of the COVID-19 declaration under Section 564(b)(1) of the Act, 21 U.S.C. section 360bbb-3(b)(1), unless the authorization is terminated or revoked sooner.  Performed at [facility], [address], [place], [place] [address]      Labs: Basic Metabolic Panel: Recent Labs  Lab 04/17/20 2009 04/18/20 0501  NA 136 137  K 4.1 4.0  CL 104 106  CO2 21* 22  GLUCOSE 117* 136*  BUN 45* 45*  CREATININE 4.56* 4.43*  CALCIUM 8.8* 8.7*  MG  --  2.0  PHOS  --  3.6   Liver Function Tests: Recent Labs  Lab 04/18/20 0501  AST 16  ALT 13  ALKPHOS 56  BILITOT 0.6  PROT 7.1  ALBUMIN 3.7   CBC: Recent Labs  Lab 04/17/20 2009 04/18/20 0501  WBC 5.5 5.9  HGB 8.9* 9.4*  HCT 27.4* 29.0*  MCV 82.8 81.9  PLT 183 192   BNP (last 3 results) Recent Labs    04/17/20 2009  BNP 204.0*    CBG: Recent Labs  Lab 04/18/20 0726 04/18/20 1222 04/18/20 1624  GLUCAP 123* 117* 136*    Signed:  Barton Dubois MD.  [facility] 04/18/2020, 4:46 PM

## 2020-04-18 NOTE — Consult Note (Addendum)
Cardiology Consult    Patient ID: Olan Kurek; [id number]; 10-29-1935   Admit date: 04/17/2020 Date of Consult: 04/18/2020  Primary Care Provider: Rosita Fire, MD Primary Cardiologist: Kate Sable, MD   Patient Profile    Mitesh Rosendahl is a 84 y.o. male with past medical history of palpitations (PAC's and PVC's by prior EKG tracings), HTN, HLD, Type 2 DM, Stage 4 CKD and prostate cancer (s/p radiation) who is being seen today for the evaluation of chest pain at the request of Dr. Dyann Kief.   History of Present Illness    Mr. Pascua most recently had a telehealth visit in 05/2019 with Dr. Bronson Ing and denied any chest pain or palpitations. He was continued on his current medication regimen with ASA 42m daily, Lopressor 12.575mBID, Procardia 9085maily and Pravastatin 20m56mily.   [facility]. In talking with the patient today, he reports having episodes of chest discomfort and headaches over the past several days. He describes his episodes of chest pain as a discomfort along his entire precordium which can last for 6 to 8 hours at a time. Symptoms are not worse with exertion, food consumption or positional changes.  He does report having baseline dyspnea on exertion but denies any specific orthopnea or PND. He also reports having lower extremity edema and was on Lasix 40 mg daily per Dr. BhutTheador HawthorneBP was elevated to 177/79 while in the ED. Initial labs show WBC 5.5, Hgb 8.9, platelets 183, Na+ 136, K+ 4.1 and creatinine 4.56 (previously 3.83 one month prior). BNP 204. HS Troponin values flat at 16 and 18. COVID negative. CXR showed chronic scarring and basilar atelectasis with no acute findings. CT Head showed chronic white matter ischemic changes without acute abnormalities. EKG shows NSR, HR 76 with PAC's, LAD and TWI along Lead V6 which is similar to prior tracings.   He denies any recurrent chest pain since  admission.  Reports feeling back to baseline this morning but still has pitting edema.   Past Medical History:  Diagnosis Date  . Arthritis   . CKD (chronic kidney disease) stage 4, GFR 15-29 ml/min (HCC)   . Essential hypertension   . Hyperlipidemia   . Prostate cancer (HCC)[place]. Renal insufficiency   . Type 2 diabetes mellitus (HCC)[place]  Past Surgical History:  Procedure Laterality Date  . CATARACT EXTRACTION W/PHACO Right 04/28/2016   Procedure: CATARACT EXTRACTION PHACO AND INTRAOCULAR LENS PLACEMENT (IOC);  Surgeon: CarrWilliams Che;  Location: [facility];  Service: Ophthalmology;  Laterality: Right;  CDE: 4.28  . COLONOSCOPY     about 2 years in [place] CYSTECTOMY       Home Medications:  Prior to Admission medications   Medication Sig Start Date End Date Taking? Authorizing Provider  Accu-Chek Softclix Lancets lancets  10/27/19   [provider]  albuterol (PROVENTIL HFA;VENTOLIN HFA) 108 (90 Base) MCG/ACT inhaler Inhale 1-2 puffs into the lungs every 6 (six) hours as needed for wheezing or shortness of breath.    [provider]  Ascorbic Acid (VITAMIN C) 1000 MG tablet Take by mouth.    [provider]  aspirin 81 MG tablet Take 81 mg by mouth daily.    [provider]  fluticasone (FLOAsencion Islam MCG/ACT nasal spray  01/14/19   [provider]  furosemide (LASIX) 20 MG tablet Take 1 tablet (20 mg  total) by mouth daily for 5 days. Patient not taking: Reported on 02/29/2020 11/14/18 02/29/20  Noemi Chapel, MD  furosemide (LASIX) 40 MG tablet Take 40 mg by mouth daily.  12/21/19   [provider]  gabapentin (NEURONTIN) 300 MG capsule Take 1 capsule by mouth 2 (two) times daily. 01/19/15   [provider]  glucose blood (ACCU-CHEK AVIVA PLUS) test strip  11/27/17   [provider]  hydrALAZINE (APRESOLINE) 50 MG tablet Take 50 mg by mouth 3 (three) times daily.    [provider]  hydrALAZINE (APRESOLINE) 50  MG tablet SMARTSIG:1 Tablet(s) By Mouth 3 Times Daily 11/30/19   [provider]  HYDROcodone-acetaminophen (NORCO) 5-325 MG tablet Take 1-2 tablets by mouth every 6 (six) hours as needed. Patient not taking: Reported on 02/29/2020 12/25/19   Veryl Speak, MD  HYDROcodone-acetaminophen (NORCO/VICODIN) 5-325 MG tablet Take by mouth.    [provider]  Lancets Misc. (ACCU-CHEK SOFTCLIX LANCET DEV) KIT  10/28/19   [provider]  linagliptin (TRADJENTA) 5 MG TABS tablet Take 5 mg by mouth daily.    [provider]  metoprolol tartrate (LOPRESSOR) 25 MG tablet Take 0.5 tablets (12.5 mg total) by mouth 2 (two) times daily. Patient taking differently: Take 25 mg by mouth 2 (two) times daily.  06/16/19   Herminio Commons, MD  Multiple Vitamin (MULTIVITAMIN WITH MINERALS) TABS tablet Take 1 tablet by mouth daily.    [provider]  naproxen (NAPROSYN) 375 MG tablet Take 1 tablet (375 mg total) by mouth 2 (two) times daily. Patient not taking: Reported on 02/07/2020 12/16/19   Nat Christen, MD  NIFEdipine (PROCARDIA XL/ADALAT-CC) 90 MG 24 hr tablet TAKE (1) TABLET BY MOUTH ONCE DAILY. 06/17/18   Herminio Commons, MD  [facility] VERIO test strip 1 each 2 (two) times daily. 12/21/19   [provider]  pravastatin (PRAVACHOL) 40 MG tablet Take 1 tablet by mouth every evening.  04/06/13   [provider]  predniSONE (DELTASONE) 10 MG tablet Take 2 tablets (20 mg total) by mouth 2 (two) times daily. Patient not taking: Reported on 02/07/2020 12/25/19   Veryl Speak, MD  sodium bicarbonate 650 MG tablet Take 650 mg by mouth daily.  06/24/19   [provider]  tamsulosin (FLOMAX) 0.4 MG CAPS Take 1 capsule by mouth daily.  04/06/13   [provider]  traMADol (ULTRAM) 50 MG tablet Take 50 mg by mouth 2 (two) times daily as needed. 10/26/19   [provider]  valACYclovir (VALTREX) 1000 MG tablet Take 1 tablet (1,000 mg total) by  mouth 3 (three) times daily. Patient not taking: Reported on 02/07/2020 04/09/19   Milton Ferguson, MD  zolpidem Lorrin Mais) 5 MG tablet  07/11/19   [provider]    Inpatient Medications: Scheduled Meds: . aspirin  324 mg Oral Once  . aspirin EC  81 mg Oral Daily  . heparin  5,000 Units Subcutaneous Q8H  . hydrALAZINE  50 mg Oral Q8H  . insulin aspart  0-5 Units Subcutaneous QHS  . insulin aspart  0-9 Units Subcutaneous TID WC  . isosorbide mononitrate  30 mg Oral Daily  . metoprolol tartrate  25 mg Oral BID  . NIFEdipine  90 mg Oral Daily  . pravastatin  40 mg Oral QPM  . sodium chloride flush  3 mL Intravenous Once  . tamsulosin  0.4 mg Oral Daily   Continuous Infusions:  PRN Meds: albuterol, nitroGLYCERIN  Allergies:  No Known Allergies  Social History:   Social History   Socioeconomic History  . Marital status: Divorced    Spouse name: Not on file  . Number of children: Not on file  . Years of education: Not on file  . Highest education level: Not on file  Occupational History  . Occupation: Retired  Tobacco Use  . Smoking status: Former Smoker    Packs/day: 2.00    Years: 25.00    Pack years: 50.00    Types: Pipe, Cigars    Start date: 11/08/1954    Quit date: 10/14/1987    Years since quitting: 32.5  . Smokeless tobacco: Never Used  . Tobacco comment: About 2 pipes a day  Vaping Use  . Vaping Use: Never used  Substance and Sexual Activity  . Alcohol use: No    Alcohol/week: 0.0 standard drinks  . Drug use: No  . Sexual activity: Not on file  Other Topics Concern  . Not on file  Social History Narrative  . Not on file   Social Determinants of Health   Financial Resource Strain:   . Difficulty of Paying Living Expenses:   Food Insecurity:   . Worried About Charity fundraiser in the Last Year:   . Arboriculturist in the Last Year:   Transportation Needs:   . Film/video editor (Medical):   Marland Kitchen Lack of Transportation (Non-Medical):    Physical Activity:   . Days of Exercise per Week:   . Minutes of Exercise per Session:   Stress:   . Feeling of Stress :   Social Connections:   . Frequency of Communication with Friends and Family:   . Frequency of Social Gatherings with Friends and Family:   . Attends Religious Services:   . Active Member of Clubs or Organizations:   . Attends Archivist Meetings:   Marland Kitchen Marital Status:   Intimate Partner Violence:   . Fear of Current or Ex-Partner:   . Emotionally Abused:   Marland Kitchen Physically Abused:   . Sexually Abused:      Family History:    Family History  Problem Relation Age of Onset  . Heart disease Mother        cause of death  . Diabetes Sister   . Colon cancer Neg Hx       Review of Systems    General:  No chills, fever, night sweats or weight changes.  Cardiovascular:  No orthopnea, palpitations, paroxysmal nocturnal dyspnea. Positive for chest pain, dyspnea on exertion and edema.  Dermatological: No rash, lesions/masses Respiratory: No cough, dyspnea Urologic: No hematuria, dysuria Abdominal:   No nausea, vomiting, diarrhea, bright red blood per rectum, melena, or hematemesis Neurologic:  No visual changes, wkns, changes in mental status. Positive for headaches.   All other systems reviewed and are otherwise negative except as noted above.  Physical Exam/Data    Vitals:   04/18/20 0130 04/18/20 0230 04/18/20 0330 04/18/20 0432  BP: (!) 170/83 (!) 165/85 (!) 172/75 (!) 184/93  Pulse: 72 85 84 89  Resp: (!) 22 20 19    Temp:    98.2 F (36.8 C)  TempSrc:    Oral  SpO2: 99% 98% 96% 100%  Weight:      Height:        Intake/Output Summary (Last 24 hours) at 04/18/2020 0843 Last data filed at 04/18/2020 0500 Gross per 24 hour  Intake -  Output 200 ml  Net -200 ml  Filed Weights   04/17/20 1956  Weight: 77.1 kg   Body mass index is 28.29 kg/m.   General: Pleasant male appearing in NAD Psych: Normal affect. Neuro: Alert and oriented X 3.  Moves all extremities spontaneously. HEENT: Normal  Neck: Supple without bruits or JVD. Lungs:  Resp regular and unlabored, slightly decreased breath sounds along the bases. No wheezing or rales Heart: RRR no s3, s4, or murmurs. Abdomen: Soft, non-tender, non-distended, BS + x 4.  Extremities: No clubbing or cyanosis. 1+ pitting edema bilaterally. DP/PT/Radials 2+ and equal bilaterally.   EKG:  The EKG was personally reviewed and demonstrates: NSR, HR 76 with PAC's, LAD and TWI along Lead V6 which is similar to prior tracings.   Telemetry:  Telemetry was personally reviewed and demonstrates: NSr, HR in 60's to 70's with occasional PAC's and PVC's.    Labs/Studies     Relevant CV Studies:  NST: 05/2018  There was no ST segment deviation noted during stress.  The study is normal. There are no perfusion defects  This is a low risk study.  The left ventricular ejection fraction is hyperdynamic (>65%).  Limited Echocardiogram: 07/2018 Study Conclusions   - Left ventricle: The cavity size was normal. Wall thickness was  normal. Systolic function was vigorous. The estimated ejection  fraction was in the range of 65% to 70%. Wall motion was normal;  there were no regional wall motion abnormalities. Doppler  parameters are consistent with abnormal left ventricular  relaxation (grade 1 diastolic dysfunction). The E/e&' ratio is  >15, suggesting elevated LV filling pressure.  - Aortic valve: Sclerosis without stenosis. There was trivial  regurgitation.  - Mitral valve: Mildly thickened leaflets . There was mild  regurgitation.  - Tricuspid valve: There was mild regurgitation.  - Pulmonary arteries: PA peak pressure: 41 mm Hg (S).  - Inferior vena cava: The vessel was normal in size. The  respirophasic diameter changes were in the normal range (>= 50%),  consistent with normal central venous pressure.   Impressions:   - Compared to a prior study in 05/2018,  there are few changes. LV  filling pressure is elevated.   Laboratory Data:  Chemistry Recent Labs  Lab 04/17/20 2009 04/18/20 0501  NA 136 137  K 4.1 4.0  CL 104 106  CO2 21* 22  GLUCOSE 117* 136*  BUN 45* 45*  CREATININE 4.56* 4.43*  CALCIUM 8.8* 8.7*  GFRNONAA 11* 12*  GFRAA 13* 13*  ANIONGAP 11 9    Recent Labs  Lab 04/18/20 0501  PROT 7.1  ALBUMIN 3.7  AST 16  ALT 13  ALKPHOS 56  BILITOT 0.6   Hematology Recent Labs  Lab 04/17/20 2009 04/18/20 0501  WBC 5.5 5.9  RBC 3.31* 3.54*  HGB 8.9* 9.4*  HCT 27.4* 29.0*  MCV 82.8 81.9  MCH 26.9 26.6  MCHC 32.5 32.4  RDW 13.8 13.8  PLT 183 192   Cardiac EnzymesNo results for input(s): TROPONINI in the last 168 hours. No results for input(s): TROPIPOC in the last 168 hours.  BNP Recent Labs  Lab 04/17/20 2009  BNP 204.0*    DDimer No results for input(s): DDIMER in the last 168 hours.  Radiology/Studies:  DG Chest 2 View  Result Date: 04/17/2020 CLINICAL DATA:  Chest pain, feeling unwell for 3 days, history of diabetes, prostate cancer and former smoker the EXAM: CHEST - 2 VIEW COMPARISON:  Radiograph 02/29/2020, CT 07/25/2018 FINDINGS: Bandlike area of scarring in the right mid lung.  Symmetric nipple shadows project over the chest bilaterally. Some streaky atelectatic changes are noted in the lung bases. No consolidation, features of edema, pneumothorax, or effusion. Borderline cardiomegaly. The aorta is calcified. The remaining cardiomediastinal contours are unremarkable. No acute osseous or soft tissue abnormality. IMPRESSION: 1. Chronic scarring and basilar atelectasis. 2. No acute cardiopulmonary abnormality. 3. Borderline cardiomegaly. Electronically Signed   By: Lovena Le M.D.   On: 04/17/2020 20:25   CT HEAD WO CONTRAST  Result Date: 04/18/2020 CLINICAL DATA:  Headaches and weakness EXAM: CT HEAD WITHOUT CONTRAST TECHNIQUE: Contiguous axial images were obtained from the base of the skull through the  vertex without intravenous contrast. COMPARISON:  01/05/2020 FINDINGS: Brain: Mild chronic white matter ischemic changes are noted. No findings to suggest acute hemorrhage, acute infarction or space-occupying mass lesion are seen. Vascular: No hyperdense vessel or unexpected calcification. Skull: Normal. Negative for fracture or focal lesion. Sinuses/Orbits: No acute finding. Other: None IMPRESSION: Chronic white matter ischemic changes without acute abnormality. Electronically Signed   By: Inez Catalina M.D.   On: 04/18/2020 00:37     Assessment & Plan    1. Chest Pain with Atypical Features - His episodes of chest discomfort overall seem atypical for angina as they can last for 6-8 hours and can occur at rest or with activity.  - HS Troponin values have been flat at 16 and 18. EKG shows NSR, HR 76 with PAC's, LAD and TWI along Lead V6 which is similar to prior tracings.  - While he does have multiple cardiac risk factors (HTN, HLD, Type 2 DM, family history of CAD and prior tobacco use), he is not a candidate for a cardiac catheterization at this time given his worsening renal function as he would be high-risk for contrast-induced nephropathy. NST in 05/2018 was low-risk as outlined above. Would not anticipate a repeat stress test this admission as it would not change the plan at this point. Will discuss with Dr. Domenic Polite in regards to updating his echocardiogram as medications could be adjusted if found to have a new cardiomyopathy. He is already on ASA, statin and BB therapy. Will start a trial of Imdur 63m daily.   2. Palpitations - He has known PAC's and PVC's by prior EKG tracings. He denies any recent symptoms. Remains on Lopressor 291mBID.   3. Accelerated HTN - BP has been elevated at 161/74 - 184/93 since admission. He is scheduled to receive Hydralazine 5056mID (prior notes in [facility] he was taking 100m56mD so this needs to be verified with Pharmacy), Lopressor 25mg40mD and Nifedipine 90mg 72my at this time. Hydralazine can be further titrated if on 50mg T3mr Lopressor can be switched to Coreg if BP remains above goal.   4. HLD - Followed by his PCP as an outpatient. He remains on Pravastatin 40mg da73m   5. Stage 4 - 5 CKD -  Creatinine elevated to 4.56 on admission (previously 3.83 one month prior). Nephrology has been consulted by the admitting team. He does have pitting edema on examination and will defer diuretic adjustment to Nephrology given his underlying CKD. Was on Lasix 40mg dai58mrior to admission but by review of Dr. [facility] [place] concern for compliance with his current regimen.    For questions or updates, please contact [facility] [email or web address] for contact info under Cardiology/STEMI.  Signed, Brittany Erma Heritage7/2021, 8:43 AM Pager: [phone] note:  Patient seen and examined.  I reviewed his records and discussed case with Ms. Ahmed Prima PA-C, I agree with her above findings.  Mr. Couser is a former patient of Dr. Bronson Ing, last evaluated in August 2020.  He has a history of palpitations secondary to PACs and PVCs, also hypertension and CKD stage IV-V.  He has no documented history of ischemic heart disease based on noninvasive imaging studies including a Myoview from 12/31/2017, although multiple cardiac risk factors and high pretest probability of same.  He presents describing recurring episodes of prolonged chest discomfort  (sometimes up to 8 hours at a time), describes an ache or pressure sensation.  No obvious precipitant or alleviating factors.  Does not describe any dysphagia or reflux.  He is not aware of any increasing sense of palpitations.  Denies any reflux symptoms.  On examination this morning he appears comfortable at rest, no active chest pain.  Systolic blood pressure elevated in the 160s to 180s, heart rate in the 70s in sinus rhythm by  telemetry which I personally reviewed.  Lungs are clear to auscultation.  Cardiac exam reveals RRR with soft systolic murmur.  No gallop.  Pertinent lab work includes potassium 4.0, BUN 45, creatinine 4.43, normal AST and ALT, BNP 204, high-sensitivity troponin I levels of 16 and 18 not suggestive of ACS, hemoglobin 9.4, platelets 192, hemoglobin A1c 6.3%.  I personally reviewed his tracing from 04/17/2020 which shows sinus rhythm with left anterior fascicular block, PACs, nonspecific ST-T changes.  Chest x-ray reports chronic scarring and bibasilar atelectasis with no acute findings and borderline cardiomegaly.  Patient presents with prolonged recurring episodes of chest discomfort.  He has a high pretest probability of ischemic heart disease although no objective diagnosis at least based on Myoview from Dec 31, 2017.  ECG is nonspecific and high-sensitivity troponin I levels are not consistent with ACS.  Blood pressure not well controlled.  He has CKD stage IV-V which would place him at very high risk of contrast nephropathy, and therefore a cardiac catheterization will not be pursued at this time.  Even if we performed a follow-up Myoview which was found to be abnormal, our first step would be medical therapy adjustments.  We will check an echocardiogram to assess cardiac structure and function.  Continue aspirin, hydralazine, Lopressor, nifedipine, and Pravachol.  Also adding Imdur.  Depending on LVEF may need make further adjustments as well.  Will follow with you.   Satira Sark, M.D., F.A.C.C.

## 2020-04-18 NOTE — H&P (Signed)
History and Physical  Frandy Basnett ZOX:[id number] DOB: 01-01-1936 DOA: 04/17/2020  Referring physician: Gerlene Fee MD PCP: Rosita Fire, MD  Patient coming from: Home  Chief Complaint: Chest pain  HPI: Steve [name] is a 84 y.o. male with medical history significant for type II diabetes mellitus, hypertension, CKD stage IV who presents to the emergency department due to 3 day onset of generalized malaise, intermittent chest pain described as soreness on left anterior chest area, chest pain was nonreproducible, nonradiating and was with no exacerbating or elevating factor.  Patient also complained of headaches, but he denies fever, chills, nausea, vomiting or abdominal pain.  Of note, patient ambulates with a cane at baseline  ED Course: In the emergency department, BP was 161/74, but other vital signs are within normal range.  Work-up in the ED showed normal CBC, BUN to creatinine 45/4.56 (baseline creatinine was 3.78 3.78-3.83).  proBNP 204, troponin I 16>18.  CT without contrast showed no acute abnormality.  Chest x-ray showed no acute cardiopulmonary abnormality.  Aspirin 324 mg p.o. x1 was given.  Hospitalist was asked to admit him for further evaluation and management.  Review of Systems: Constitutional: Negative for chills and fever.  HENT: Negative for ear pain and sore throat.   Eyes: Negative for pain and visual disturbance.  Respiratory: Negative for cough, chest tightness and shortness of breath.   Cardiovascular: Positive for chest pain.  Negative for palpitations.  Gastrointestinal: Negative for abdominal pain and vomiting.  Endocrine: Negative for polyphagia and polyuria.  Genitourinary: Negative for decreased urine volume, dysuria, enuresis, hematuria, Musculoskeletal: Negative for arthralgias and back pain.  Skin: Negative for color change and rash.  Allergic/Immunologic: Negative for immunocompromised state.  Neurological: Negative for tremors, syncope, speech  difficulty, weakness, light-headedness and headaches.  Hematological: Does not bruise/bleed easily.  All other systems reviewed and are negative   Past Medical History:  Diagnosis Date  . Arthritis   . CKD (chronic kidney disease) stage 4, GFR 15-29 ml/min (HCC)   . Essential hypertension   . Hyperlipidemia   . Prostate cancer ([place])   . Renal insufficiency   . Type 2 diabetes mellitus ([place])    Past Surgical History:  Procedure Laterality Date  . CATARACT EXTRACTION W/PHACO Right 04/28/2016   Procedure: CATARACT EXTRACTION PHACO AND INTRAOCULAR LENS PLACEMENT (IOC);  Surgeon: Williams Che, MD;  Location: [facility];  Service: Ophthalmology;  Laterality: Right;  CDE: 4.28  . COLONOSCOPY     about 2 years in [place]   . CYSTECTOMY      Social History:  reports that he quit smoking about 32 years ago. His smoking use included pipe and cigars. He started smoking about 65 years ago. He has a 50.00 pack-year smoking history. He has never used smokeless tobacco. He reports that he does not drink alcohol and does not use drugs.   No Known Allergies  Family History  Problem Relation Age of Onset  . Heart disease Mother        cause of death  . Diabetes Sister   . Colon cancer Neg Hx     Prior to Admission medications   Medication Sig Start Date End Date Taking? Authorizing Provider  Accu-Chek Softclix Lancets lancets  10/27/19   [provider]  albuterol (PROVENTIL HFA;VENTOLIN HFA) 108 (90 Base) MCG/ACT inhaler Inhale 1-2 puffs into the lungs every 6 (six) hours as needed for wheezing or shortness of breath.    [provider]  Ascorbic Acid (VITAMIN C) 1000  MG tablet Take by mouth.    [provider]  aspirin 81 MG tablet Take 81 mg by mouth daily.    [provider]  fluticasone Asencion Islam) 50 MCG/ACT nasal spray  01/14/19   [provider]  furosemide (LASIX) 20 MG tablet Take 1 tablet (20 mg total) by mouth daily for 5 days. Patient not  taking: Reported on 02/29/2020 11/14/18 02/29/20  Noemi Chapel, MD  furosemide (LASIX) 40 MG tablet Take 40 mg by mouth daily.  12/21/19   [provider]  gabapentin (NEURONTIN) 300 MG capsule Take 1 capsule by mouth 2 (two) times daily. 01/19/15   [provider]  glucose blood (ACCU-CHEK AVIVA PLUS) test strip  11/27/17   [provider]  hydrALAZINE (APRESOLINE) 50 MG tablet Take 50 mg by mouth 3 (three) times daily.    [provider]  hydrALAZINE (APRESOLINE) 50 MG tablet SMARTSIG:1 Tablet(s) By Mouth 3 Times Daily 11/30/19   [provider]  HYDROcodone-acetaminophen (NORCO) 5-325 MG tablet Take 1-2 tablets by mouth every 6 (six) hours as needed. Patient not taking: Reported on 02/29/2020 12/25/19   Veryl Speak, MD  HYDROcodone-acetaminophen (NORCO/VICODIN) 5-325 MG tablet Take by mouth.    [provider]  Lancets Misc. (ACCU-CHEK SOFTCLIX LANCET DEV) KIT  10/28/19   [provider]  linagliptin (TRADJENTA) 5 MG TABS tablet Take 5 mg by mouth daily.    [provider]  metoprolol tartrate (LOPRESSOR) 25 MG tablet Take 0.5 tablets (12.5 mg total) by mouth 2 (two) times daily. Patient taking differently: Take 25 mg by mouth 2 (two) times daily.  06/16/19   Herminio Commons, MD  Multiple Vitamin (MULTIVITAMIN WITH MINERALS) TABS tablet Take 1 tablet by mouth daily.    [provider]  naproxen (NAPROSYN) 375 MG tablet Take 1 tablet (375 mg total) by mouth 2 (two) times daily. Patient not taking: Reported on 02/07/2020 12/16/19   Nat Christen, MD  NIFEdipine (PROCARDIA XL/ADALAT-CC) 90 MG 24 hr tablet TAKE (1) TABLET BY MOUTH ONCE DAILY. 06/17/18   Herminio Commons, MD  [facility] VERIO test strip 1 each 2 (two) times daily. 12/21/19   [provider]  pravastatin (PRAVACHOL) 40 MG tablet Take 1 tablet by mouth every evening.  04/06/13   [provider]  predniSONE (DELTASONE) 10 MG tablet Take 2 tablets (20  mg total) by mouth 2 (two) times daily. Patient not taking: Reported on 02/07/2020 12/25/19   Veryl Speak, MD  sodium bicarbonate 650 MG tablet Take 650 mg by mouth daily.  06/24/19   [provider]  tamsulosin (FLOMAX) 0.4 MG CAPS Take 1 capsule by mouth daily.  04/06/13   [provider]  traMADol (ULTRAM) 50 MG tablet Take 50 mg by mouth 2 (two) times daily as needed. 10/26/19   [provider]  valACYclovir (VALTREX) 1000 MG tablet Take 1 tablet (1,000 mg total) by mouth 3 (three) times daily. Patient not taking: Reported on 02/07/2020 04/09/19   Milton Ferguson, MD  zolpidem Lorrin Mais) 5 MG tablet  07/11/19   [provider]    Physical Exam: BP (!) 184/93 (BP Location: Left Arm)   Pulse 89   Temp 98.2 F (36.8 C) (Oral)   Resp 19   Ht '5\' 5"'$  (1.651 m)   Wt 77.1 kg   SpO2 100%   BMI 28.29 kg/m   . General: 84 y.o. year-old male well developed well nourished in no acute distress.  Alert and oriented x3. Marland Kitchen  HEENT: NCAT, EOMI . Neck: Supple, trachea midline . Cardiovascular: Regular rate and rhythm with no rubs or gallops.  No thyromegaly or JVD noted.  +2 lower extremity edema. 2/4 pulses in all 4 extremities. Marland Kitchen Respiratory: Clear to auscultation with no wheezes or rales. Good inspiratory effort. . Abdomen: Soft nontender nondistended with normal bowel sounds x4 quadrants. . Muskuloskeletal: No cyanosis, clubbing or edema noted bilaterally . Neuro: CN II-XII intact, strength, sensation, reflexes . Skin: No ulcerative lesions noted or rashes . Psychiatry: Judgement and insight appear normal. Mood is appropriate for condition and setting          Labs on Admission:  Basic Metabolic Panel: Recent Labs  Lab 04/17/20 2009  NA 136  K 4.1  CL 104  CO2 21*  GLUCOSE 117*  BUN 45*  CREATININE 4.56*  CALCIUM 8.8*   Liver Function Tests: No results for input(s): AST, ALT, ALKPHOS, BILITOT, PROT, ALBUMIN in the last 168 hours. No results for  input(s): LIPASE, AMYLASE in the last 168 hours. No results for input(s): AMMONIA in the last 168 hours. CBC: Recent Labs  Lab 04/17/20 2009  WBC 5.5  HGB 8.9*  HCT 27.4*  MCV 82.8  PLT 183   Cardiac Enzymes: No results for input(s): CKTOTAL, CKMB, CKMBINDEX, TROPONINI in the last 168 hours.  BNP (last 3 results) Recent Labs    04/17/20 2009  BNP 204.0*    ProBNP (last 3 results) No results for input(s): PROBNP in the last 8760 hours.  CBG: No results for input(s): GLUCAP in the last 168 hours.  Radiological Exams on Admission: DG Chest 2 View  Result Date: 04/17/2020 CLINICAL DATA:  Chest pain, feeling unwell for 3 days, history of diabetes, prostate cancer and former smoker the EXAM: CHEST - 2 VIEW COMPARISON:  Radiograph 02/29/2020, CT 07/25/2018 FINDINGS: Bandlike area of scarring in the right mid lung. Symmetric nipple shadows project over the chest bilaterally. Some streaky atelectatic changes are noted in the lung bases. No consolidation, features of edema, pneumothorax, or effusion. Borderline cardiomegaly. The aorta is calcified. The remaining cardiomediastinal contours are unremarkable. No acute osseous or soft tissue abnormality. IMPRESSION: 1. Chronic scarring and basilar atelectasis. 2. No acute cardiopulmonary abnormality. 3. Borderline cardiomegaly. Electronically Signed   By: Lovena Le M.D.   On: 04/17/2020 20:25   CT HEAD WO CONTRAST  Result Date: 04/18/2020 CLINICAL DATA:  Headaches and weakness EXAM: CT HEAD WITHOUT CONTRAST TECHNIQUE: Contiguous axial images were obtained from the base of the skull through the vertex without intravenous contrast. COMPARISON:  01/05/2020 FINDINGS: Brain: Mild chronic white matter ischemic changes are noted. No findings to suggest acute hemorrhage, acute infarction or space-occupying mass lesion are seen. Vascular: No hyperdense vessel or unexpected calcification. Skull: Normal. Negative for fracture or focal lesion.  Sinuses/Orbits: No acute finding. Other: None IMPRESSION: Chronic white matter ischemic changes without acute abnormality. Electronically Signed   By: Inez Catalina M.D.   On: 04/18/2020 00:37    EKG: I independently viewed the EKG done and my findings are as followed: Sinus rhythm at 76 bpm with PACs and no change since 5/21   Assessment/Plan Present on Admission: . Atypical chest pain . HTN (hypertension) . Elevated troponin . DM (diabetes mellitus), type 2 with renal complications ([place]) . Hyperlipidemia  Principal Problem:   Atypical chest pain Active Problems:   HTN (hypertension)   Elevated troponin   DM (diabetes mellitus), type 2 with renal complications (HCC)   Hyperlipidemia  Atypical chest pain  Patient is currently chest pain-free Heart score equals 5 Patient will be admitted to telemetry unit. Troponin x 2 16> 18 EKG with sinus rhythm Cardiology will be consulted to help decide if stress test is needed in am Versus other diagnostic modalities.   Continue aspirin 49m daily and nitroglycerin as needed  Elevated troponin possibly secondary to demand ischemia Troponin x2 16 > 18 Cardiology consulted as indicated above  Acute kidney injury on CKD IV BUN to creatinine 45/4.56 (baseline creatinine was 3.78 3.78-3.83) eGFR is now at 13 (stage V), this was at sixteen 1 month ago Renally adjust medications, avoid nephrotoxic agents/dehydration/hypotension Nephrology will be consulted for further evaluation and recommendation.  We shall await the recommendation  Essential hypertension (uncontrolled) IV hydralazine 10 mg daily saline was given Continue home meds when med rec is updated  Hyperlipidemia Continue pravastatin when med rec is updated  Type II diabetes mellitus Blood glucose was 117 Continuously insulin sliding scale and hypoglycemia protocol   DVT prophylaxis: Subcu heparin  Code Status: Full code  Family Communication: Nephew at bedside (all  questions answered to satisfaction)  Disposition Plan:  Patient is from:                        home Anticipated DC to:                   home Anticipated DC date:            24 hours Anticipated DC barriers:       Pending cardiology and nephrology consult and recommendation   Consults called: Cardiology, nephrology  Admission status: Observation    OBernadette HoitMD [facility]  If 7PM-7AM, please contact night-coverage [email or web address]  04/18/2020, 5:10 AM

## 2020-04-18 NOTE — Progress Notes (Signed)
Nsg Discharge Note  Admit Date:  04/17/2020 Discharge date: 04/18/2020   [place]gye Form to be D/C'd Home per MD order.  AVS completed.  Copy for chart, and copy for patient signed, and dated. Patient/caregiver able to verbalize understanding.  Discharge Medication: Allergies as of 04/18/2020   No Known Allergies     Medication List    STOP taking these medications   HYDROcodone-acetaminophen 5-325 MG tablet Commonly known as: Norco   naproxen 375 MG tablet Commonly known as: NAPROSYN   predniSONE 10 MG tablet Commonly known as: DELTASONE   valACYclovir 1000 MG tablet Commonly known as: VALTREX     TAKE these medications   Accu-Chek Aviva Plus test strip Generic drug: glucose blood   OneTouch Verio test strip Generic drug: glucose blood 1 each 2 (two) times daily.   Accu-Chek Softclix Lancet Dev Kit   Accu-Chek Softclix Lancets lancets   albuterol 108 (90 Base) MCG/ACT inhaler Commonly known as: VENTOLIN HFA Inhale 1-2 puffs into the lungs every 6 (six) hours as needed for wheezing or shortness of breath.   aspirin 81 MG tablet Take 81 mg by mouth daily.   furosemide 20 MG tablet Commonly known as: LASIX Take 1 tablet (20 mg total) by mouth daily. Start taking on: April 22, 2020 What changed:   These instructions start on April 22, 2020. If you are unsure what to do until then, ask your doctor or other care provider.  Another medication with the same name was removed. Continue taking this medication, and follow the directions you see here.   gabapentin 300 MG capsule Commonly known as: NEURONTIN Take 1 capsule by mouth daily.   hydrALAZINE 50 MG tablet Commonly known as: APRESOLINE Take 50 mg by mouth 3 (three) times daily.   isosorbide mononitrate 30 MG 24 hr tablet Commonly known as: IMDUR Take 1 tablet (30 mg total) by mouth daily. Start taking on: April 19, 2020   metoprolol tartrate 25 MG tablet Commonly known as: LOPRESSOR Take 1 tablet (25 mg  total) by mouth 2 (two) times daily.   multivitamin with minerals Tabs tablet Take 1 tablet by mouth daily.   NIFEdipine 90 MG 24 hr tablet Commonly known as: PROCARDIA XL/NIFEDICAL-XL TAKE (1) TABLET BY MOUTH ONCE DAILY.   pantoprazole 40 MG tablet Commonly known as: PROTONIX Take 1 tablet (40 mg total) by mouth daily. Start taking on: April 19, 2020   pravastatin 40 MG tablet Commonly known as: PRAVACHOL Take 1 tablet by mouth every evening.   sodium bicarbonate 650 MG tablet Take 650 mg by mouth daily.   tamsulosin 0.4 MG Caps capsule Commonly known as: FLOMAX Take 1 capsule by mouth daily.   Tradjenta 5 MG Tabs tablet Generic drug: linagliptin Take 5 mg by mouth daily.   traMADol 50 MG tablet Commonly known as: ULTRAM Take 1 tablet (50 mg total) by mouth every 12 (twelve) hours as needed for severe pain. What changed:   when to take this  reasons to take this   zolpidem 5 MG tablet Commonly known as: AMBIEN 5 mg at bedtime as needed.       Discharge Assessment: Vitals:   04/18/20 1356 04/18/20 1501  BP: (!) 102/45 (!) 116/59  Pulse: (!) 56 (!) 57  Resp: 18 16  Temp: 98.7 F (37.1 C) 98.6 F (37 C)  SpO2: 100% 99%   Skin clean, dry and intact without evidence of skin break down, no evidence of skin tears noted. IV catheter discontinued intact.  Site without signs and symptoms of complications - no redness or edema noted at insertion site, patient denies c/o pain - only slight tenderness at site.  Dressing with slight pressure applied.  D/c Instructions-Education: Discharge instructions given to patient/family with verbalized understanding. D/c education completed with patient/family including follow up instructions, medication list, d/c activities limitations if indicated, with other d/c instructions as indicated by MD - patient able to verbalize understanding, all questions fully answered. Patient instructed to return to ED, call 911, or call MD for any  changes in condition.  Patient escorted via [place], and D/C home via private auto.  Dorcas Mcmurray, RN 04/18/2020 5:16 PM

## 2020-04-18 NOTE — Progress Notes (Signed)
*  PRELIMINARY RESULTS* Echocardiogram 2D Echocardiogram has been performed.  Steve [name] 04/18/2020, 12:58 PM

## 2020-04-21 DIAGNOSIS — J42 Unspecified chronic bronchitis: Secondary | ICD-10-CM | POA: Diagnosis not present

## 2020-04-21 DIAGNOSIS — R0602 Shortness of breath: Secondary | ICD-10-CM | POA: Diagnosis not present

## 2020-04-24 ENCOUNTER — Telehealth: Payer: Self-pay | Admitting: Urology

## 2020-04-24 DIAGNOSIS — I10 Essential (primary) hypertension: Secondary | ICD-10-CM | POA: Diagnosis not present

## 2020-04-24 DIAGNOSIS — N184 Chronic kidney disease, stage 4 (severe): Secondary | ICD-10-CM | POA: Diagnosis not present

## 2020-04-24 NOTE — Telephone Encounter (Signed)
This pt called having issues and wants appointment asap. He asked for a nurse to call him.

## 2020-04-25 NOTE — Telephone Encounter (Signed)
Called pt. No answer °

## 2020-04-30 DIAGNOSIS — E785 Hyperlipidemia, unspecified: Secondary | ICD-10-CM | POA: Diagnosis not present

## 2020-04-30 DIAGNOSIS — I5030 Unspecified diastolic (congestive) heart failure: Secondary | ICD-10-CM | POA: Diagnosis not present

## 2020-04-30 DIAGNOSIS — N184 Chronic kidney disease, stage 4 (severe): Secondary | ICD-10-CM | POA: Diagnosis not present

## 2020-04-30 DIAGNOSIS — E1142 Type 2 diabetes mellitus with diabetic polyneuropathy: Secondary | ICD-10-CM | POA: Diagnosis not present

## 2020-04-30 DIAGNOSIS — E11628 Type 2 diabetes mellitus with other skin complications: Secondary | ICD-10-CM | POA: Diagnosis not present

## 2020-04-30 DIAGNOSIS — R0789 Other chest pain: Secondary | ICD-10-CM | POA: Diagnosis not present

## 2020-04-30 DIAGNOSIS — I10 Essential (primary) hypertension: Secondary | ICD-10-CM | POA: Diagnosis not present

## 2020-05-02 DIAGNOSIS — I5030 Unspecified diastolic (congestive) heart failure: Secondary | ICD-10-CM | POA: Diagnosis not present

## 2020-05-02 DIAGNOSIS — R0789 Other chest pain: Secondary | ICD-10-CM | POA: Diagnosis not present

## 2020-05-02 DIAGNOSIS — E1142 Type 2 diabetes mellitus with diabetic polyneuropathy: Secondary | ICD-10-CM | POA: Diagnosis not present

## 2020-05-02 DIAGNOSIS — N184 Chronic kidney disease, stage 4 (severe): Secondary | ICD-10-CM | POA: Diagnosis not present

## 2020-05-04 DIAGNOSIS — E1122 Type 2 diabetes mellitus with diabetic chronic kidney disease: Secondary | ICD-10-CM | POA: Diagnosis not present

## 2020-05-04 DIAGNOSIS — D631 Anemia in chronic kidney disease: Secondary | ICD-10-CM | POA: Diagnosis not present

## 2020-05-04 DIAGNOSIS — E1129 Type 2 diabetes mellitus with other diabetic kidney complication: Secondary | ICD-10-CM | POA: Diagnosis not present

## 2020-05-04 DIAGNOSIS — R809 Proteinuria, unspecified: Secondary | ICD-10-CM | POA: Diagnosis not present

## 2020-05-04 DIAGNOSIS — N185 Chronic kidney disease, stage 5: Secondary | ICD-10-CM | POA: Diagnosis not present

## 2020-05-10 ENCOUNTER — Other Ambulatory Visit: Payer: Self-pay | Admitting: Neurology

## 2020-05-10 ENCOUNTER — Other Ambulatory Visit (HOSPITAL_COMMUNITY): Payer: Self-pay | Admitting: Neurology

## 2020-05-10 DIAGNOSIS — E875 Hyperkalemia: Secondary | ICD-10-CM | POA: Diagnosis not present

## 2020-05-10 DIAGNOSIS — R809 Proteinuria, unspecified: Secondary | ICD-10-CM | POA: Diagnosis not present

## 2020-05-10 DIAGNOSIS — N185 Chronic kidney disease, stage 5: Secondary | ICD-10-CM | POA: Diagnosis not present

## 2020-05-10 DIAGNOSIS — D631 Anemia in chronic kidney disease: Secondary | ICD-10-CM | POA: Diagnosis not present

## 2020-05-10 DIAGNOSIS — M4712 Other spondylosis with myelopathy, cervical region: Secondary | ICD-10-CM

## 2020-05-10 DIAGNOSIS — I10 Essential (primary) hypertension: Secondary | ICD-10-CM | POA: Diagnosis not present

## 2020-05-22 ENCOUNTER — Other Ambulatory Visit: Payer: Self-pay

## 2020-05-22 ENCOUNTER — Encounter (HOSPITAL_COMMUNITY): Payer: Self-pay

## 2020-05-22 ENCOUNTER — Encounter (HOSPITAL_COMMUNITY)
Admission: RE | Admit: 2020-05-22 | Discharge: 2020-05-22 | Disposition: A | Discharge status: Home / Self Care | Payer: Medicare Other | Source: Ambulatory Visit | Attending: Nephrology | Admitting: Nephrology

## 2020-05-22 DIAGNOSIS — R0602 Shortness of breath: Secondary | ICD-10-CM | POA: Diagnosis not present

## 2020-05-22 DIAGNOSIS — N185 Chronic kidney disease, stage 5: Secondary | ICD-10-CM | POA: Insufficient documentation

## 2020-05-22 DIAGNOSIS — J42 Unspecified chronic bronchitis: Secondary | ICD-10-CM | POA: Diagnosis not present

## 2020-05-22 DIAGNOSIS — D631 Anemia in chronic kidney disease: Secondary | ICD-10-CM | POA: Insufficient documentation

## 2020-05-22 LAB — POCT HEMOGLOBIN-HEMACUE: Hemoglobin: 10.2 g/dL — ABNORMAL LOW (ref 13.0–17.0)

## 2020-05-22 MED ORDER — EPOETIN ALFA-EPBX 4000 UNIT/ML IJ SOLN: 4000.0000 [IU] | Freq: Once | INTRAMUSCULAR | Status: DC

## 2020-05-31 ENCOUNTER — Other Ambulatory Visit: Payer: Self-pay

## 2020-05-31 ENCOUNTER — Ambulatory Visit (HOSPITAL_COMMUNITY)
Admission: RE | Admit: 2020-05-31 | Discharge: 2020-05-31 | Disposition: A | Discharge status: Home / Self Care | Payer: Medicare Other | Source: Ambulatory Visit | Attending: Neurology | Admitting: Neurology

## 2020-05-31 ENCOUNTER — Encounter (HOSPITAL_COMMUNITY): Payer: Self-pay

## 2020-05-31 DIAGNOSIS — M4712 Other spondylosis with myelopathy, cervical region: Secondary | ICD-10-CM

## 2020-05-31 DIAGNOSIS — E1142 Type 2 diabetes mellitus with diabetic polyneuropathy: Secondary | ICD-10-CM | POA: Diagnosis not present

## 2020-05-31 DIAGNOSIS — K219 Gastro-esophageal reflux disease without esophagitis: Secondary | ICD-10-CM | POA: Diagnosis not present

## 2020-06-05 ENCOUNTER — Encounter (HOSPITAL_COMMUNITY)
Admission: RE | Admit: 2020-06-05 | Discharge: 2020-06-05 | Disposition: A | Discharge status: Home / Self Care | Payer: Medicare Other | Source: Ambulatory Visit | Attending: Nephrology | Admitting: Nephrology

## 2020-06-05 ENCOUNTER — Other Ambulatory Visit: Payer: Self-pay

## 2020-06-05 ENCOUNTER — Encounter (HOSPITAL_COMMUNITY): Payer: Self-pay

## 2020-06-05 DIAGNOSIS — N185 Chronic kidney disease, stage 5: Secondary | ICD-10-CM | POA: Diagnosis not present

## 2020-06-05 DIAGNOSIS — D631 Anemia in chronic kidney disease: Secondary | ICD-10-CM | POA: Diagnosis not present

## 2020-06-05 HISTORY — DX: Anemia, unspecified: D64.9

## 2020-06-05 HISTORY — DX: Gastro-esophageal reflux disease without esophagitis: K21.9

## 2020-06-05 LAB — POCT HEMOGLOBIN-HEMACUE: Hemoglobin: 9.1 g/dL — ABNORMAL LOW (ref 13.0–17.0)

## 2020-06-05 MED ORDER — EPOETIN ALFA-EPBX 10000 UNIT/ML IJ SOLN
4000.0000 [IU] | Freq: Once | INTRAMUSCULAR | Status: AC
  Administered 2020-06-05: 4000 [IU] via SUBCUTANEOUS

## 2020-06-05 MED ORDER — EPOETIN ALFA-EPBX 2000 UNIT/ML IJ SOLN
INTRAMUSCULAR | Status: AC
  Filled 2020-06-05: qty 2

## 2020-06-06 DIAGNOSIS — M4712 Other spondylosis with myelopathy, cervical region: Secondary | ICD-10-CM | POA: Diagnosis not present

## 2020-06-06 DIAGNOSIS — G8314 Monoplegia of lower limb affecting left nondominant side: Secondary | ICD-10-CM | POA: Diagnosis not present

## 2020-06-06 DIAGNOSIS — R269 Unspecified abnormalities of gait and mobility: Secondary | ICD-10-CM | POA: Diagnosis not present

## 2020-06-06 DIAGNOSIS — E1142 Type 2 diabetes mellitus with diabetic polyneuropathy: Secondary | ICD-10-CM | POA: Diagnosis not present

## 2020-06-12 ENCOUNTER — Ambulatory Visit: Payer: Medicare HMO | Admitting: Urology

## 2020-06-19 ENCOUNTER — Encounter (HOSPITAL_COMMUNITY)
Admission: RE | Admit: 2020-06-19 | Discharge: 2020-06-19 | Disposition: A | Discharge status: Home / Self Care | Payer: Medicare Other | Source: Ambulatory Visit | Attending: Nephrology | Admitting: Nephrology

## 2020-06-19 ENCOUNTER — Other Ambulatory Visit: Payer: Self-pay

## 2020-06-19 ENCOUNTER — Encounter (HOSPITAL_COMMUNITY): Payer: Self-pay

## 2020-06-19 DIAGNOSIS — N185 Chronic kidney disease, stage 5: Secondary | ICD-10-CM | POA: Diagnosis not present

## 2020-06-19 DIAGNOSIS — D631 Anemia in chronic kidney disease: Secondary | ICD-10-CM | POA: Insufficient documentation

## 2020-06-19 LAB — POCT HEMOGLOBIN-HEMACUE: Hemoglobin: 10 g/dL — ABNORMAL LOW (ref 13.0–17.0)

## 2020-06-19 MED ORDER — EPOETIN ALFA-EPBX 10000 UNIT/ML IJ SOLN: 4000.0000 [IU] | Freq: Once | INTRAMUSCULAR | Status: DC

## 2020-06-22 DIAGNOSIS — N185 Chronic kidney disease, stage 5: Secondary | ICD-10-CM | POA: Diagnosis not present

## 2020-06-22 DIAGNOSIS — R0602 Shortness of breath: Secondary | ICD-10-CM | POA: Diagnosis not present

## 2020-06-22 DIAGNOSIS — Z79899 Other long term (current) drug therapy: Secondary | ICD-10-CM | POA: Diagnosis not present

## 2020-06-22 DIAGNOSIS — R809 Proteinuria, unspecified: Secondary | ICD-10-CM | POA: Diagnosis not present

## 2020-06-22 DIAGNOSIS — E875 Hyperkalemia: Secondary | ICD-10-CM | POA: Diagnosis not present

## 2020-06-22 DIAGNOSIS — J42 Unspecified chronic bronchitis: Secondary | ICD-10-CM | POA: Diagnosis not present

## 2020-06-22 DIAGNOSIS — D631 Anemia in chronic kidney disease: Secondary | ICD-10-CM | POA: Diagnosis not present

## 2020-06-22 DIAGNOSIS — I10 Essential (primary) hypertension: Secondary | ICD-10-CM | POA: Diagnosis not present

## 2020-07-03 ENCOUNTER — Other Ambulatory Visit: Payer: Self-pay

## 2020-07-03 ENCOUNTER — Encounter (HOSPITAL_COMMUNITY)
Admission: RE | Admit: 2020-07-03 | Discharge: 2020-07-03 | Disposition: A | Discharge status: Home / Self Care | Payer: Medicare Other | Source: Ambulatory Visit | Attending: Nephrology | Admitting: Nephrology

## 2020-07-03 ENCOUNTER — Encounter (HOSPITAL_COMMUNITY): Payer: Self-pay

## 2020-07-03 DIAGNOSIS — D631 Anemia in chronic kidney disease: Secondary | ICD-10-CM | POA: Diagnosis not present

## 2020-07-03 DIAGNOSIS — N185 Chronic kidney disease, stage 5: Secondary | ICD-10-CM | POA: Diagnosis not present

## 2020-07-03 LAB — RENAL FUNCTION PANEL
Albumin: 4 g/dL (ref 3.5–5.0)
Anion gap: 12 (ref 5–15)
BUN: 70 mg/dL — ABNORMAL HIGH (ref 8–23)
CO2: 20 mmol/L — ABNORMAL LOW (ref 22–32)
Calcium: 9.1 mg/dL (ref 8.9–10.3)
Chloride: 104 mmol/L (ref 98–111)
Creatinine, Ser: 5.98 mg/dL — ABNORMAL HIGH (ref 0.61–1.24)
GFR calc Af Amer: 9 mL/min — ABNORMAL LOW (ref >60)
GFR calc non Af Amer: 8 mL/min — ABNORMAL LOW (ref >60)
Glucose, Bld: 177 mg/dL — ABNORMAL HIGH (ref 70–99)
Phosphorus: 3.7 mg/dL (ref 2.5–4.6)
Potassium: 4.4 mmol/L (ref 3.5–5.1)
Sodium: 136 mmol/L (ref 135–145)

## 2020-07-03 LAB — POCT HEMOGLOBIN-HEMACUE: Hemoglobin: 9.8 g/dL — ABNORMAL LOW (ref 13.0–17.0)

## 2020-07-03 LAB — CBC
HCT: 29.5 % — ABNORMAL LOW (ref 39.0–52.0)
Hemoglobin: 9.9 g/dL — ABNORMAL LOW (ref 13.0–17.0)
MCH: 27.2 pg (ref 26.0–34.0)
MCHC: 33.6 g/dL (ref 30.0–36.0)
MCV: 81 fL (ref 80.0–100.0)
Platelets: 221 10*3/uL (ref 150–400)
RBC: 3.64 MIL/uL — ABNORMAL LOW (ref 4.22–5.81)
RDW: 13 % (ref 11.5–15.5)
WBC: 5.8 10*3/uL (ref 4.0–10.5)
nRBC: 0 % (ref 0.0–0.2)

## 2020-07-03 LAB — PROTEIN / CREATININE RATIO, URINE
Creatinine, Urine: 119.41 mg/dL
Protein Creatinine Ratio: 1.24 mg/mg{Cre} — ABNORMAL HIGH (ref 0.00–0.15)
Total Protein, Urine: 148 mg/dL

## 2020-07-03 MED ORDER — EPOETIN ALFA-EPBX 2000 UNIT/ML IJ SOLN
2000.0000 [IU] | Freq: Once | INTRAMUSCULAR | Status: AC
  Administered 2020-07-03: 2000 [IU] via SUBCUTANEOUS

## 2020-07-03 MED ORDER — EPOETIN ALFA-EPBX 10000 UNIT/ML IJ SOLN: 4000.0000 [IU] | Freq: Once | INTRAMUSCULAR | Status: DC

## 2020-07-03 MED ORDER — EPOETIN ALFA-EPBX 2000 UNIT/ML IJ SOLN
INTRAMUSCULAR | Status: AC
  Filled 2020-07-03: qty 2

## 2020-07-06 DIAGNOSIS — E872 Acidosis: Secondary | ICD-10-CM | POA: Diagnosis not present

## 2020-07-06 DIAGNOSIS — D631 Anemia in chronic kidney disease: Secondary | ICD-10-CM | POA: Diagnosis not present

## 2020-07-06 DIAGNOSIS — R809 Proteinuria, unspecified: Secondary | ICD-10-CM | POA: Diagnosis not present

## 2020-07-06 DIAGNOSIS — N185 Chronic kidney disease, stage 5: Secondary | ICD-10-CM | POA: Diagnosis not present

## 2020-07-06 DIAGNOSIS — I10 Essential (primary) hypertension: Secondary | ICD-10-CM | POA: Diagnosis not present

## 2020-07-09 DIAGNOSIS — Z23 Encounter for immunization: Secondary | ICD-10-CM | POA: Diagnosis not present

## 2020-07-09 DIAGNOSIS — I10 Essential (primary) hypertension: Secondary | ICD-10-CM | POA: Diagnosis not present

## 2020-07-09 DIAGNOSIS — E1142 Type 2 diabetes mellitus with diabetic polyneuropathy: Secondary | ICD-10-CM | POA: Diagnosis not present

## 2020-07-09 DIAGNOSIS — N184 Chronic kidney disease, stage 4 (severe): Secondary | ICD-10-CM | POA: Diagnosis not present

## 2020-07-09 DIAGNOSIS — E785 Hyperlipidemia, unspecified: Secondary | ICD-10-CM | POA: Diagnosis not present

## 2020-07-17 ENCOUNTER — Encounter (HOSPITAL_COMMUNITY)
Admission: RE | Admit: 2020-07-17 | Discharge: 2020-07-17 | Disposition: A | Discharge status: Home / Self Care | Payer: Medicare Other | Source: Ambulatory Visit | Attending: Nephrology | Admitting: Nephrology

## 2020-07-17 ENCOUNTER — Other Ambulatory Visit: Payer: Self-pay

## 2020-07-17 ENCOUNTER — Encounter (HOSPITAL_COMMUNITY): Payer: Self-pay

## 2020-07-17 DIAGNOSIS — D631 Anemia in chronic kidney disease: Secondary | ICD-10-CM | POA: Diagnosis not present

## 2020-07-17 DIAGNOSIS — N185 Chronic kidney disease, stage 5: Secondary | ICD-10-CM | POA: Diagnosis not present

## 2020-07-17 LAB — POCT HEMOGLOBIN-HEMACUE: Hemoglobin: 9.8 g/dL — ABNORMAL LOW (ref 13.0–17.0)

## 2020-07-17 MED ORDER — EPOETIN ALFA-EPBX 2000 UNIT/ML IJ SOLN
2000.0000 [IU] | Freq: Once | INTRAMUSCULAR | Status: AC
  Administered 2020-07-17: 2000 [IU] via SUBCUTANEOUS

## 2020-07-17 MED ORDER — EPOETIN ALFA-EPBX 2000 UNIT/ML IJ SOLN
INTRAMUSCULAR | Status: AC
  Filled 2020-07-17: qty 2

## 2020-07-18 ENCOUNTER — Other Ambulatory Visit: Payer: Self-pay | Admitting: Neurosurgery

## 2020-07-18 DIAGNOSIS — M5412 Radiculopathy, cervical region: Secondary | ICD-10-CM | POA: Diagnosis not present

## 2020-07-18 DIAGNOSIS — M47812 Spondylosis without myelopathy or radiculopathy, cervical region: Secondary | ICD-10-CM | POA: Diagnosis not present

## 2020-07-18 DIAGNOSIS — G959 Disease of spinal cord, unspecified: Secondary | ICD-10-CM | POA: Diagnosis not present

## 2020-07-18 DIAGNOSIS — M542 Cervicalgia: Secondary | ICD-10-CM | POA: Diagnosis not present

## 2020-07-18 DIAGNOSIS — M4802 Spinal stenosis, cervical region: Secondary | ICD-10-CM | POA: Diagnosis not present

## 2020-07-31 ENCOUNTER — Encounter (HOSPITAL_COMMUNITY): Payer: Self-pay

## 2020-07-31 ENCOUNTER — Encounter (HOSPITAL_COMMUNITY)
Admission: RE | Admit: 2020-07-31 | Discharge: 2020-07-31 | Disposition: A | Discharge status: Home / Self Care | Payer: Medicare Other | Source: Ambulatory Visit | Attending: Nephrology | Admitting: Nephrology

## 2020-07-31 ENCOUNTER — Other Ambulatory Visit: Payer: Self-pay

## 2020-07-31 DIAGNOSIS — N185 Chronic kidney disease, stage 5: Secondary | ICD-10-CM | POA: Diagnosis not present

## 2020-07-31 DIAGNOSIS — D631 Anemia in chronic kidney disease: Secondary | ICD-10-CM | POA: Diagnosis not present

## 2020-07-31 LAB — RENAL FUNCTION PANEL
Albumin: 3.7 g/dL (ref 3.5–5.0)
Anion gap: 15 (ref 5–15)
BUN: 66 mg/dL — ABNORMAL HIGH (ref 8–23)
CO2: 20 mmol/L — ABNORMAL LOW (ref 22–32)
Calcium: 9.1 mg/dL (ref 8.9–10.3)
Chloride: 98 mmol/L (ref 98–111)
Creatinine, Ser: 5.71 mg/dL — ABNORMAL HIGH (ref 0.61–1.24)
GFR, Estimated: 8 mL/min — ABNORMAL LOW (ref >60)
Glucose, Bld: 89 mg/dL (ref 70–99)
Phosphorus: 4.6 mg/dL (ref 2.5–4.6)
Potassium: 4.1 mmol/L (ref 3.5–5.1)
Sodium: 133 mmol/L — ABNORMAL LOW (ref 135–145)

## 2020-07-31 LAB — CBC
HCT: 31.7 % — ABNORMAL LOW (ref 39.0–52.0)
Hemoglobin: 10.8 g/dL — ABNORMAL LOW (ref 13.0–17.0)
MCH: 26.3 pg (ref 26.0–34.0)
MCHC: 34.1 g/dL (ref 30.0–36.0)
MCV: 77.3 fL — ABNORMAL LOW (ref 80.0–100.0)
Platelets: 240 10*3/uL (ref 150–400)
RBC: 4.1 MIL/uL — ABNORMAL LOW (ref 4.22–5.81)
RDW: 12.6 % (ref 11.5–15.5)
WBC: 4.5 10*3/uL (ref 4.0–10.5)
nRBC: 0 % (ref 0.0–0.2)

## 2020-07-31 LAB — POCT HEMOGLOBIN-HEMACUE: Hemoglobin: 10.9 g/dL — ABNORMAL LOW (ref 13.0–17.0)

## 2020-07-31 MED ORDER — EPOETIN ALFA-EPBX 2000 UNIT/ML IJ SOLN: 2000.0000 [IU] | Freq: Once | INTRAMUSCULAR | Status: DC

## 2020-08-01 DIAGNOSIS — R809 Proteinuria, unspecified: Secondary | ICD-10-CM | POA: Diagnosis not present

## 2020-08-01 DIAGNOSIS — I10 Essential (primary) hypertension: Secondary | ICD-10-CM | POA: Diagnosis not present

## 2020-08-01 DIAGNOSIS — D631 Anemia in chronic kidney disease: Secondary | ICD-10-CM | POA: Diagnosis not present

## 2020-08-01 DIAGNOSIS — N185 Chronic kidney disease, stage 5: Secondary | ICD-10-CM | POA: Diagnosis not present

## 2020-08-01 DIAGNOSIS — E1122 Type 2 diabetes mellitus with diabetic chronic kidney disease: Secondary | ICD-10-CM | POA: Diagnosis not present

## 2020-08-08 DIAGNOSIS — K219 Gastro-esophageal reflux disease without esophagitis: Secondary | ICD-10-CM | POA: Diagnosis not present

## 2020-08-08 DIAGNOSIS — E1142 Type 2 diabetes mellitus with diabetic polyneuropathy: Secondary | ICD-10-CM | POA: Diagnosis not present

## 2020-08-14 ENCOUNTER — Encounter (HOSPITAL_COMMUNITY)
Admission: RE | Admit: 2020-08-14 | Discharge: 2020-08-14 | Disposition: A | Discharge status: Home / Self Care | Payer: Medicare Other | Source: Ambulatory Visit | Attending: Nephrology | Admitting: Nephrology

## 2020-08-14 ENCOUNTER — Encounter (HOSPITAL_COMMUNITY): Payer: Self-pay

## 2020-08-14 ENCOUNTER — Other Ambulatory Visit: Payer: Self-pay

## 2020-08-14 DIAGNOSIS — N185 Chronic kidney disease, stage 5: Secondary | ICD-10-CM | POA: Insufficient documentation

## 2020-08-14 DIAGNOSIS — D631 Anemia in chronic kidney disease: Secondary | ICD-10-CM | POA: Insufficient documentation

## 2020-08-14 LAB — POCT HEMOGLOBIN-HEMACUE: Hemoglobin: 10.6 g/dL — ABNORMAL LOW (ref 13.0–17.0)

## 2020-08-14 MED ORDER — EPOETIN ALFA-EPBX 10000 UNIT/ML IJ SOLN: 4000.0000 [IU] | Freq: Once | INTRAMUSCULAR | Status: DC

## 2020-08-20 ENCOUNTER — Encounter (HOSPITAL_COMMUNITY): Payer: Self-pay

## 2020-08-20 ENCOUNTER — Other Ambulatory Visit (HOSPITAL_COMMUNITY)
Admission: RE | Admit: 2020-08-20 | Discharge: 2020-08-20 | Disposition: A | Discharge status: Home / Self Care | Payer: Medicare Other | Source: Ambulatory Visit | Attending: Neurosurgery | Admitting: Neurosurgery

## 2020-08-20 ENCOUNTER — Encounter (HOSPITAL_COMMUNITY)
Admission: RE | Admit: 2020-08-20 | Discharge: 2020-08-20 | Disposition: A | Discharge status: Home / Self Care | Payer: Medicare Other | Source: Ambulatory Visit | Attending: Neurosurgery | Admitting: Neurosurgery

## 2020-08-20 ENCOUNTER — Other Ambulatory Visit: Payer: Self-pay

## 2020-08-20 DIAGNOSIS — Z01812 Encounter for preprocedural laboratory examination: Secondary | ICD-10-CM | POA: Insufficient documentation

## 2020-08-20 DIAGNOSIS — Z01818 Encounter for other preprocedural examination: Secondary | ICD-10-CM | POA: Diagnosis not present

## 2020-08-20 DIAGNOSIS — Z20822 Contact with and (suspected) exposure to covid-19: Secondary | ICD-10-CM | POA: Insufficient documentation

## 2020-08-20 LAB — SARS CORONAVIRUS 2 (TAT 6-24 HRS): SARS Coronavirus 2: NEGATIVE

## 2020-08-20 LAB — CBC
HCT: 32.7 % — ABNORMAL LOW (ref 39.0–52.0)
Hemoglobin: 10.7 g/dL — ABNORMAL LOW (ref 13.0–17.0)
MCH: 26.7 pg (ref 26.0–34.0)
MCHC: 32.7 g/dL (ref 30.0–36.0)
MCV: 81.5 fL (ref 80.0–100.0)
Platelets: 235 10*3/uL (ref 150–400)
RBC: 4.01 MIL/uL — ABNORMAL LOW (ref 4.22–5.81)
RDW: 12.8 % (ref 11.5–15.5)
WBC: 6.1 10*3/uL (ref 4.0–10.5)
nRBC: 0 % (ref 0.0–0.2)

## 2020-08-20 LAB — BASIC METABOLIC PANEL
Anion gap: 14 (ref 5–15)
BUN: 70 mg/dL — ABNORMAL HIGH (ref 8–23)
CO2: 21 mmol/L — ABNORMAL LOW (ref 22–32)
Calcium: 9.5 mg/dL (ref 8.9–10.3)
Chloride: 103 mmol/L (ref 98–111)
Creatinine, Ser: 5.76 mg/dL — ABNORMAL HIGH (ref 0.61–1.24)
GFR, Estimated: 9 mL/min — ABNORMAL LOW (ref >60)
Glucose, Bld: 108 mg/dL — ABNORMAL HIGH (ref 70–99)
Potassium: 4.1 mmol/L (ref 3.5–5.1)
Sodium: 138 mmol/L (ref 135–145)

## 2020-08-20 LAB — TYPE AND SCREEN
ABO/RH(D): B POS
Antibody Screen: NEGATIVE

## 2020-08-20 LAB — HEMOGLOBIN A1C
Hgb A1c MFr Bld: 6.6 % — ABNORMAL HIGH (ref 4.8–5.6)
Mean Plasma Glucose: 142.72 mg/dL

## 2020-08-20 LAB — SURGICAL PCR SCREEN
MRSA, PCR: NEGATIVE
Staphylococcus aureus: NEGATIVE

## 2020-08-20 LAB — GLUCOSE, CAPILLARY: Glucose-Capillary: 88 mg/dL (ref 70–99)

## 2020-08-20 NOTE — Progress Notes (Signed)
PCP - Legrand Rams in [place] [facility]: [facility]   Chest x-ray - 04/17/20 EKG - 04/17/20 Stress Test - 05/13/18 ECHO - 04/18/20 Cardiac Cath - na  Sleep Study - na   Fasting Blood Sugar - 130 Checks Blood Sugar _2____ times a day or every other day  Blood Thinner Instructions:na Aspirin Instructions: today    COVID TEST-  08/20/20   Anesthesia review: ekg/cardiac hx.  Patient denies shortness of breath, fever, cough and chest pain at PAT appointment   All instructions explained to the patient, with a verbal understanding of the material. Patient agrees to go over the instructions while at home for a better understanding. Patient also instructed to self quarantine after being tested for COVID-19. The opportunity to ask questions was provided.

## 2020-08-20 NOTE — Pre-Procedure Instructions (Signed)
Steve [name]  08/20/2020      [facility] - [place], [place] [address] Phone: [phone] Fax: [phone]    Your procedure is scheduled on Nov. 11  Report to [facility] Entrance A at 12:30 P.M.  Call this number if you have problems the morning of surgery:  [phone]   Remember:  Do not eat or drink after midnight.     Take these medicines the morning of surgery with A SIP OF WATER :              Gabapentin (neurontin)             Hydralazine (apresoline)             Isosorbide (imdur)             Lamotrigine (lamictal)             metorpolol (lopressor)             Nifedipine (procardia xl)             Pantoprazole (protonix)             Pravastatin (pravachol)             Prednisone (deltasone)             tamsulosin (flomax)              Sodium bicarbonate   7 days prior to surgery STOP taking any Aspirin (unless otherwise instructed by your surgeon), Aleve, Naproxen, Ibuprofen, Motrin, Advil, Goody's, BC's, all herbal medications, fish oil, and all vitamins.                How to Manage Your Diabetes Before and After Surgery  Why is it important to control my blood sugar before and after surgery? . Improving blood sugar levels before and after surgery helps healing and can limit problems. . A way of improving blood sugar control is eating a healthy diet by: o  Eating less sugar and carbohydrates o  Increasing activity/exercise o  Talking with your doctor about reaching your blood sugar goals . High blood sugars (greater than 180 mg/dL) can raise your risk of infections and slow your recovery, so you will need to focus on controlling your diabetes during the weeks before surgery. . Make sure that the doctor who takes care of your diabetes knows about your planned surgery including the date and location.  How do I manage my blood sugar before surgery? . Check your blood sugar at least 4 times a day,  starting 2 days before surgery, to make sure that the level is not too high or low. o Check your blood sugar the morning of your surgery when you wake up and every 2 hours until you get to the Short Stay unit. . If your blood sugar is less than 70 mg/dL, you will need to treat for low blood sugar: o Do not take insulin. o Treat a low blood sugar (less than 70 mg/dL) with  cup of clear juice (cranberry or apple), 4 glucose tablets, OR glucose gel. Recheck blood sugar in 15 minutes after treatment (to make sure it is greater than 70 mg/dL). If your blood sugar is not greater than 70 mg/dL on recheck, call [phone] o  for further instructions. . Report your blood sugar to the short stay nurse when you get to Short Stay.  . If  you are admitted to the hospital after surgery: o Your blood sugar will be checked by the staff and you will probably be given insulin after surgery (instead of oral diabetes medicines) to make sure you have good blood sugar levels. o The goal for blood sugar control after surgery is 80-180 mg/dL       WHAT DO I DO ABOUT MY DIABETES MEDICATION?  Marland Kitchen Do not take oral diabetes medicines (pills) the morning of surgery.  (linagliptin/tradjenta)       Do not wear jewelry.  Do not wear lotions, powders, or perfumes, or deodorant.  Do not shave 48 hours prior to surgery.  Men may shave face and neck.  Do not bring valuables to the hospital.  [facility] is not responsible for any belongings or valuables.  Contacts, dentures or bridgework may not be worn into surgery.  Leave your suitcase in the car.  After surgery it may be brought to your room.  For patients admitted to the hospital, discharge time will be determined by your treatment team.  Patients discharged the day of surgery will not be allowed to drive home.    Special instructions:   [facility]- Preparing For Surgery  Before surgery, you can play an important role. Because skin is not sterile, your skin  needs to be as free of germs as possible. You can reduce the number of germs on your skin by washing with CHG (chlorahexidine gluconate) Soap before surgery.  CHG is an antiseptic cleaner which kills germs and bonds with the skin to continue killing germs even after washing.    Oral Hygiene is also important to reduce your risk of infection.  Remember - BRUSH YOUR TEETH THE MORNING OF SURGERY WITH YOUR REGULAR TOOTHPASTE  Please do not use if you have an allergy to CHG or antibacterial soaps. If your skin becomes reddened/irritated stop using the CHG.  Do not shave (including legs and underarms) for at least 48 hours prior to first CHG shower. It is OK to shave your face.  Please follow these instructions carefully.   1. Shower the NIGHT BEFORE SURGERY and the MORNING OF SURGERY with CHG.   2. If you chose to wash your hair, wash your hair first as usual with your normal shampoo.  3. After you shampoo, rinse your hair and body thoroughly to remove the shampoo.  4. Use CHG as you would any other liquid soap. You can apply CHG directly to the skin and wash gently with a scrungie or a clean washcloth.   5. Apply the CHG Soap to your body ONLY FROM THE NECK DOWN.  Do not use on open wounds or open sores. Avoid contact with your eyes, ears, mouth and genitals (private parts). Wash Face and genitals (private parts)  with your normal soap.  6. Wash thoroughly, paying special attention to the area where your surgery will be performed.  7. Thoroughly rinse your body with warm water from the neck down.  8. DO NOT shower/wash with your normal soap after using and rinsing off the CHG Soap.  9. Pat yourself dry with a CLEAN TOWEL.  10. Wear CLEAN PAJAMAS to bed the night before surgery, wear comfortable clothes the morning of surgery  11. Place CLEAN SHEETS on your bed the night of your first shower and DO NOT SLEEP WITH PETS.    Day of Surgery:  Do not apply any deodorants/lotions.  Please  wear clean clothes to the hospital/surgery center.   Remember  to brush your teeth WITH YOUR REGULAR TOOTHPASTE.    Please read over the following fact sheets that you were given.

## 2020-08-21 NOTE — Progress Notes (Signed)
Anesthesia Chart Review:  History of CKD 5, not yet on HD, followed by nephrologist Dr. Theador Hawthorne at [facility].  He was seen by Dr. Theador Hawthorne on 08/01/2020 to discuss surgery.  Per note, "Surgical risk assessment from nephrological standpoint:  Patient is scheduled to undergo C3-4 anterior cervical decompression/discectomy/fusion under general anesthesia With the patient GFR at less than 10 mL/min Patient is at high likelihood of requiring renal replacement therapy after general anesthesia There is no contraindication for surgery from nephrological standpoint but will suggest -Not to aim for systolic blood pressure less than 140 during surgery -Avoid NSAIDs- -it will be prudent to have the ability to initiate renal placement therapy in the hospital if required  I did discuss this risk with the patient patient understand the risk and is willing to undergo the surgery because at this time the benefit of surgery is higher than the risk."  Recent overnight admission on 04/17/2020 for evaluation of atypical chest pain.  Per discharge summary, he had no significant elevation in his troponin, EKG and telemetry without acute ischemic changes, echo was reassuring with no wall motion normalities, EF 65 to 70%, grade 2 DD.  Given his renal failure and high risk for contrast-induced nephropathy no further ischemic work-up was pursued.  Plan was for medical management and outpatient follow-up.  Review of preop labs shows creatinine 5.76 which appears to be near his baseline, consistent with history of CKD 5.  Mild anemia with hemoglobin 10.7.  DM2 well-controlled with A1c 6.6.  EKG 04/17/2020: Sinus rhythm with Premature atrial complexes in a pattern of bigeminy.  Rate 76. Left axis deviation. Nonspecific ST and T wave abnormality. No significant change since prior 5/21  CHEST - 2 VIEW 04/17/2020:  COMPARISON:  Radiograph 02/29/2020, CT 07/25/2018  FINDINGS: Bandlike area of scarring in  the right mid lung. Symmetric nipple shadows project over the chest bilaterally. Some streaky atelectatic changes are noted in the lung bases. No consolidation, features of edema, pneumothorax, or effusion. Borderline cardiomegaly. The aorta is calcified. The remaining cardiomediastinal contours are unremarkable. No acute osseous or soft tissue abnormality.  IMPRESSION: 1. Chronic scarring and basilar atelectasis. 2. No acute cardiopulmonary abnormality. 3. Borderline cardiomegaly.   TEE 04/18/2020: 1. Left ventricular ejection fraction, by estimation, is 65 to 70%. The  left ventricle has normal function. The left ventricle has no regional  wall motion abnormalities. There is severe left ventricular hypertrophy.  Left ventricular diastolic parameters  are consistent with Grade II diastolic dysfunction (pseudonormalization).  2. Right ventricular systolic function is normal. The right ventricular  size is normal. There is moderately elevated pulmonary artery systolic  pressure. The estimated right ventricular systolic pressure is 13.0 mmHg.  3. Left atrial size was moderately dilated.  4. Right atrial size was moderately dilated.  5. The mitral valve is grossly normal. Mild to moderate mitral valve  regurgitation.  6. The aortic valve is tricuspid. Aortic valve regurgitation is not  visualized. Mild to moderate aortic valve sclerosis/calcification is  present, without any evidence of aortic stenosis.  7. The inferior vena cava is normal in size with greater than 50%  respiratory variability, suggesting right atrial pressure of 3 mmHg.   Nuclear stress 05/13/2018:  There was no ST segment deviation noted during stress.  The study is normal. There are no perfusion defects  This is a low risk study.  The left ventricular ejection fraction is hyperdynamic (>65%).    Karoline Caldwell, PA-C [facility]/Anesthesiology Phone [phone])  [phone] 08/21/2020 12:21 PM

## 2020-08-21 NOTE — Anesthesia Preprocedure Evaluation (Addendum)
Anesthesia Evaluation  Patient identified by MRN, date of birth, ID band Patient awake    Reviewed: Allergy & Precautions, NPO status , Patient's Chart, lab work & pertinent test results  Airway Mallampati: III  TM Distance: >3 FB Neck ROM: Full    Dental  (+) Chipped,    Pulmonary Patient abstained from smoking., former smoker,    Pulmonary exam normal breath sounds clear to auscultation       Cardiovascular hypertension, Pt. on medications and Pt. on home beta blockers Normal cardiovascular exam Rhythm:Regular Rate:Normal  ECHO: 1. Left ventricular ejection fraction, by estimation, is 65 to 70%. The left ventricle has normal function. The left ventricle has no regional wall motion abnormalities. There is severe left ventricular hypertrophy. Left ventricular diastolic parameters are consistent with Grade II diastolic dysfunction (pseudonormalization). 2. Right ventricular systolic function is normal. The right ventricular size is normal. There is moderately elevated pulmonary artery systolic pressure. The estimated right ventricular systolic pressure is 23.7 mmHg. 3. Left atrial size was moderately dilated. 4. Right atrial size was moderately dilated. 5. The mitral valve is grossly normal. Mild to moderate mitral valve regurgitation. 6. The aortic valve is tricuspid. Aortic valve regurgitation is not visualized. Mild to moderate aortic valve sclerosis/calcification is present, without any evidence of aortic stenosis. 7. The inferior vena cava is normal in size with   Neuro/Psych negative psych ROS   GI/Hepatic Neg liver ROS, GERD  Medicated,  Endo/Other  diabetes, Oral Hypoglycemic Agents  Renal/GU CRFRenal disease     Musculoskeletal  (+) Arthritis ,   Abdominal   Peds  Hematology  (+) anemia , HLD   Anesthesia Other Findings  Cervical spinal stenosis  Reproductive/Obstetrics                             Anesthesia Physical Anesthesia Plan  ASA: III  Anesthesia Plan: General   Post-op Pain Management:    Induction: Intravenous  PONV Risk Score and Plan: 2 and Ondansetron, Dexamethasone and Treatment may vary due to age or medical condition  Airway Management Planned: Oral ETT and Video Laryngoscope Planned  Additional Equipment:   Intra-op Plan:   Post-operative Plan: Extubation in OR  Informed Consent: I have reviewed the patients History and Physical, chart, labs and discussed the procedure including the risks, benefits and alternatives for the proposed anesthesia with the patient or authorized representative who has indicated his/her understanding and acceptance.     Dental advisory given  Plan Discussed with: CRNA  Anesthesia Plan Comments: (PAT note by Karoline Caldwell, PA-C: History of CKD 5, not yet on HD, followed by nephrologist Dr. Theador Hawthorne at [facility].  He was seen by Dr. Theador Hawthorne on 08/01/2020 to discuss surgery.  Per note, "Surgical risk assessment from nephrological standpoint:  Patient is scheduled to undergo C3-4 anterior cervical decompression/discectomy/fusion under general anesthesia With the patient GFR at less than 10 mL/min Patient is at high likelihood of requiring renal replacement therapy after general anesthesia There is no contraindication for surgery from nephrological standpoint but will suggest -Not to aim for systolic blood pressure less than 140 during surgery -Avoid NSAIDs- -it will be prudent to have the ability to initiate renal placement therapy in the hospital if required  I did discuss this risk with the patient patient understand the risk and is willing to undergo the surgery because at this time the benefit of surgery is higher than the risk."  Recent  overnight admission on 04/17/2020 for evaluation of atypical chest pain.  Per discharge summary, he had no significant elevation in his troponin, EKG and  telemetry without acute ischemic changes, echo was reassuring with no wall motion normalities, EF 65 to 70%, grade 2 DD.  Given his renal failure and high risk for contrast-induced nephropathy no further ischemic work-up was pursued.  Plan was for medical management and outpatient follow-up.  Review of preop labs shows creatinine 5.76 which appears to be near his baseline, consistent with history of CKD 5.  Mild anemia with hemoglobin 10.7.  DM2 well-controlled with A1c 6.6.  EKG 04/17/2020: Sinus rhythm with Premature atrial complexes in a pattern of bigeminy.  Rate 76. Left axis deviation. Nonspecific ST and T wave abnormality. No significant change since prior 5/21  CHEST - 2 VIEW 04/17/2020:  COMPARISON:  Radiograph 02/29/2020, CT 07/25/2018  FINDINGS: Bandlike area of scarring in the right mid lung. Symmetric nipple shadows project over the chest bilaterally. Some streaky atelectatic changes are noted in the lung bases. No consolidation, features of edema, pneumothorax, or effusion. Borderline cardiomegaly. The aorta is calcified. The remaining cardiomediastinal contours are unremarkable. No acute osseous or soft tissue abnormality.  IMPRESSION: 1. Chronic scarring and basilar atelectasis. 2. No acute cardiopulmonary abnormality. 3. Borderline cardiomegaly.   TEE 04/18/2020: 1. Left ventricular ejection fraction, by estimation, is 65 to 70%. The  left ventricle has normal function. The left ventricle has no regional  wall motion abnormalities. There is severe left ventricular hypertrophy.  Left ventricular diastolic parameters  are consistent with Grade II diastolic dysfunction (pseudonormalization).  2. Right ventricular systolic function is normal. The right ventricular  size is normal. There is moderately elevated pulmonary artery systolic  pressure. The estimated right ventricular systolic pressure is 24.2 mmHg.  3. Left atrial size was moderately dilated.  4. Right  atrial size was moderately dilated.  5. The mitral valve is grossly normal. Mild to moderate mitral valve  regurgitation.  6. The aortic valve is tricuspid. Aortic valve regurgitation is not  visualized. Mild to moderate aortic valve sclerosis/calcification is  present, without any evidence of aortic stenosis.  7. The inferior vena cava is normal in size with greater than 50%  respiratory variability, suggesting right atrial pressure of 3 mmHg.   Nuclear stress 05/13/2018: There was no ST segment deviation noted during stress. The study is normal. There are no perfusion defects This is a low risk study. The left ventricular ejection fraction is hyperdynamic (>65%).)       Anesthesia Quick Evaluation

## 2020-08-23 ENCOUNTER — Ambulatory Visit (HOSPITAL_COMMUNITY): Payer: Medicare Other | Admitting: Certified Registered Nurse Anesthetist

## 2020-08-23 ENCOUNTER — Encounter (HOSPITAL_COMMUNITY)
Admission: RE | Disposition: A | Discharge status: Home / Self Care | Payer: Self-pay | Source: Home / Self Care | Attending: Neurosurgery

## 2020-08-23 ENCOUNTER — Ambulatory Visit (HOSPITAL_COMMUNITY): Payer: Medicare Other | Admitting: Physician Assistant

## 2020-08-23 ENCOUNTER — Ambulatory Visit (HOSPITAL_COMMUNITY): Payer: Medicare Other

## 2020-08-23 ENCOUNTER — Other Ambulatory Visit: Payer: Self-pay

## 2020-08-23 ENCOUNTER — Observation Stay (HOSPITAL_COMMUNITY)
Admission: RE | Admit: 2020-08-23 | Discharge: 2020-08-28 | Disposition: A | Discharge status: Home / Self Care | Payer: Medicare Other | Attending: Neurosurgery | Admitting: Neurosurgery

## 2020-08-23 ENCOUNTER — Encounter (HOSPITAL_COMMUNITY): Payer: Self-pay | Admitting: Neurosurgery

## 2020-08-23 DIAGNOSIS — Z9889 Other specified postprocedural states: Secondary | ICD-10-CM

## 2020-08-23 DIAGNOSIS — E119 Type 2 diabetes mellitus without complications: Secondary | ICD-10-CM | POA: Diagnosis not present

## 2020-08-23 DIAGNOSIS — G959 Disease of spinal cord, unspecified: Secondary | ICD-10-CM | POA: Diagnosis present

## 2020-08-23 DIAGNOSIS — M4712 Other spondylosis with myelopathy, cervical region: Secondary | ICD-10-CM | POA: Diagnosis not present

## 2020-08-23 DIAGNOSIS — M4322 Fusion of spine, cervical region: Secondary | ICD-10-CM | POA: Diagnosis not present

## 2020-08-23 DIAGNOSIS — M542 Cervicalgia: Secondary | ICD-10-CM | POA: Diagnosis present

## 2020-08-23 DIAGNOSIS — K219 Gastro-esophageal reflux disease without esophagitis: Secondary | ICD-10-CM | POA: Diagnosis not present

## 2020-08-23 DIAGNOSIS — M4802 Spinal stenosis, cervical region: Secondary | ICD-10-CM | POA: Diagnosis not present

## 2020-08-23 DIAGNOSIS — Z419 Encounter for procedure for purposes other than remedying health state, unspecified: Principal | ICD-10-CM

## 2020-08-23 DIAGNOSIS — D649 Anemia, unspecified: Secondary | ICD-10-CM | POA: Diagnosis not present

## 2020-08-23 DIAGNOSIS — Z7982 Long term (current) use of aspirin: Secondary | ICD-10-CM | POA: Insufficient documentation

## 2020-08-23 DIAGNOSIS — Z87891 Personal history of nicotine dependence: Secondary | ICD-10-CM | POA: Diagnosis not present

## 2020-08-23 DIAGNOSIS — E785 Hyperlipidemia, unspecified: Secondary | ICD-10-CM | POA: Diagnosis not present

## 2020-08-23 DIAGNOSIS — M5001 Cervical disc disorder with myelopathy,  high cervical region: Secondary | ICD-10-CM | POA: Diagnosis not present

## 2020-08-23 HISTORY — PX: ANTERIOR CERVICAL DECOMP/DISCECTOMY FUSION: SHX1161

## 2020-08-23 LAB — GLUCOSE, CAPILLARY
Glucose-Capillary: 111 mg/dL — ABNORMAL HIGH (ref 70–99)
Glucose-Capillary: 112 mg/dL — ABNORMAL HIGH (ref 70–99)
Glucose-Capillary: 165 mg/dL — ABNORMAL HIGH (ref 70–99)
Glucose-Capillary: 181 mg/dL — ABNORMAL HIGH (ref 70–99)

## 2020-08-23 LAB — ABO/RH: ABO/RH(D): B POS

## 2020-08-23 SURGERY — ANTERIOR CERVICAL DECOMPRESSION/DISCECTOMY FUSION 1 LEVEL
Anesthesia: General

## 2020-08-23 MED ORDER — HYDROCODONE-ACETAMINOPHEN 5-325 MG PO TABS
2.0000 | ORAL_TABLET | ORAL | Status: DC | PRN
  Administered 2020-08-24 – 2020-08-27 (×6): 2 via ORAL
  Filled 2020-08-23 (×6): qty 2

## 2020-08-23 MED ORDER — FUROSEMIDE 20 MG PO TABS: 20.0000 mg | ORAL_TABLET | Freq: Every day | ORAL | Status: DC

## 2020-08-23 MED ORDER — PHENOL 1.4 % MT LIQD
1.0000 | OROMUCOSAL | Status: DC | PRN
  Administered 2020-08-24: 1 via OROMUCOSAL
  Filled 2020-08-23: qty 177

## 2020-08-23 MED ORDER — ONDANSETRON HCL 4 MG/2ML IJ SOLN
INTRAMUSCULAR | Status: AC
  Filled 2020-08-23: qty 2

## 2020-08-23 MED ORDER — LIDOCAINE 2% (20 MG/ML) 5 ML SYRINGE
INTRAMUSCULAR | Status: DC | PRN
  Administered 2020-08-23: 60 mg via INTRAVENOUS

## 2020-08-23 MED ORDER — PRAVASTATIN SODIUM 40 MG PO TABS
40.0000 mg | ORAL_TABLET | Freq: Every day | ORAL | Status: DC
  Administered 2020-08-24 – 2020-08-28 (×5): 40 mg via ORAL
  Filled 2020-08-23 (×5): qty 1

## 2020-08-23 MED ORDER — ROCURONIUM BROMIDE 10 MG/ML (PF) SYRINGE
PREFILLED_SYRINGE | INTRAVENOUS | Status: DC | PRN
  Administered 2020-08-23: 60 mg via INTRAVENOUS

## 2020-08-23 MED ORDER — LIDOCAINE 2% (20 MG/ML) 5 ML SYRINGE
INTRAMUSCULAR | Status: AC
  Filled 2020-08-23: qty 5

## 2020-08-23 MED ORDER — SUGAMMADEX SODIUM 200 MG/2ML IV SOLN
INTRAVENOUS | Status: DC | PRN
  Administered 2020-08-23: 150 mg via INTRAVENOUS

## 2020-08-23 MED ORDER — LIDOCAINE-EPINEPHRINE 1 %-1:100000 IJ SOLN
INTRAMUSCULAR | Status: AC
  Filled 2020-08-23: qty 1

## 2020-08-23 MED ORDER — CEFAZOLIN SODIUM-DEXTROSE 2-4 GM/100ML-% IV SOLN
2.0000 g | INTRAVENOUS | Status: AC
  Administered 2020-08-23: 2 g via INTRAVENOUS
  Filled 2020-08-23: qty 100

## 2020-08-23 MED ORDER — METHOCARBAMOL 500 MG PO TABS
500.0000 mg | ORAL_TABLET | Freq: Four times a day (QID) | ORAL | Status: DC | PRN
  Administered 2020-08-24 – 2020-08-27 (×5): 500 mg via ORAL
  Filled 2020-08-23 (×5): qty 1

## 2020-08-23 MED ORDER — PREDNISONE 5 MG PO TABS
5.0000 mg | ORAL_TABLET | Freq: Every day | ORAL | Status: DC
  Administered 2020-08-24 – 2020-08-25 (×2): 5 mg via ORAL
  Filled 2020-08-23 (×2): qty 1

## 2020-08-23 MED ORDER — ONDANSETRON HCL 4 MG/2ML IJ SOLN
INTRAMUSCULAR | Status: DC | PRN
  Administered 2020-08-23: 4 mg via INTRAVENOUS

## 2020-08-23 MED ORDER — MIDAZOLAM HCL 2 MG/2ML IJ SOLN
1.0000 mg | Freq: Once | INTRAMUSCULAR | Status: AC
  Administered 2020-08-23: 1 mg via INTRAVENOUS

## 2020-08-23 MED ORDER — ACETAMINOPHEN 10 MG/ML IV SOLN: 1000.0000 mg | Freq: Once | INTRAVENOUS | Status: DC | PRN

## 2020-08-23 MED ORDER — FLEET ENEMA 7-19 GM/118ML RE ENEM: 1.0000 | ENEMA | Freq: Once | RECTAL | Status: DC | PRN

## 2020-08-23 MED ORDER — THROMBIN 5000 UNITS EX SOLR
CUTANEOUS | Status: AC
  Filled 2020-08-23: qty 5000

## 2020-08-23 MED ORDER — HYDROMORPHONE HCL 1 MG/ML IJ SOLN: 0.5000 mg | INTRAMUSCULAR | Status: DC | PRN

## 2020-08-23 MED ORDER — LIDOCAINE-EPINEPHRINE 1 %-1:100000 IJ SOLN
INTRAMUSCULAR | Status: DC | PRN
  Administered 2020-08-23: 5 mL

## 2020-08-23 MED ORDER — FENTANYL CITRATE (PF) 100 MCG/2ML IJ SOLN
INTRAMUSCULAR | Status: DC | PRN
  Administered 2020-08-23: 50 ug via INTRAVENOUS
  Administered 2020-08-23: 150 ug via INTRAVENOUS
  Administered 2020-08-23: 50 ug via INTRAVENOUS

## 2020-08-23 MED ORDER — SODIUM CHLORIDE 0.9% FLUSH: 3.0000 mL | INTRAVENOUS | Status: DC | PRN

## 2020-08-23 MED ORDER — BUPIVACAINE HCL (PF) 0.5 % IJ SOLN
INTRAMUSCULAR | Status: DC | PRN
  Administered 2020-08-23: 5 mL

## 2020-08-23 MED ORDER — LABETALOL HCL 5 MG/ML IV SOLN
INTRAVENOUS | Status: AC
  Filled 2020-08-23: qty 4

## 2020-08-23 MED ORDER — THROMBIN 5000 UNITS EX SOLR
OROMUCOSAL | Status: DC | PRN
  Administered 2020-08-23: 5 mL via TOPICAL

## 2020-08-23 MED ORDER — DEXAMETHASONE SODIUM PHOSPHATE 10 MG/ML IJ SOLN
INTRAMUSCULAR | Status: DC | PRN
  Administered 2020-08-23: 10 mg via INTRAVENOUS

## 2020-08-23 MED ORDER — ZOLPIDEM TARTRATE 5 MG PO TABS: 5.0000 mg | ORAL_TABLET | Freq: Every evening | ORAL | Status: DC | PRN

## 2020-08-23 MED ORDER — METHOCARBAMOL 1000 MG/10ML IJ SOLN
500.0000 mg | Freq: Four times a day (QID) | INTRAVENOUS | Status: DC | PRN
  Filled 2020-08-23: qty 5

## 2020-08-23 MED ORDER — ONDANSETRON HCL 4 MG PO TABS: 4.0000 mg | ORAL_TABLET | Freq: Four times a day (QID) | ORAL | Status: DC | PRN

## 2020-08-23 MED ORDER — HYDRALAZINE HCL 50 MG PO TABS
100.0000 mg | ORAL_TABLET | Freq: Three times a day (TID) | ORAL | Status: DC
  Administered 2020-08-23 – 2020-08-28 (×14): 100 mg via ORAL
  Filled 2020-08-23 (×14): qty 2

## 2020-08-23 MED ORDER — SODIUM CHLORIDE 0.9 % IV SOLN: INTRAVENOUS | Status: DC

## 2020-08-23 MED ORDER — FENTANYL CITRATE (PF) 100 MCG/2ML IJ SOLN
25.0000 ug | INTRAMUSCULAR | Status: DC | PRN
  Administered 2020-08-23 (×2): 25 ug via INTRAVENOUS

## 2020-08-23 MED ORDER — TRAMADOL HCL 50 MG PO TABS: 50.0000 mg | ORAL_TABLET | Freq: Two times a day (BID) | ORAL | Status: DC | PRN

## 2020-08-23 MED ORDER — LACTATED RINGERS IV SOLN: INTRAVENOUS | Status: DC

## 2020-08-23 MED ORDER — ALBUTEROL SULFATE (2.5 MG/3ML) 0.083% IN NEBU: 3.0000 mL | INHALATION_SOLUTION | Freq: Four times a day (QID) | RESPIRATORY_TRACT | Status: DC | PRN

## 2020-08-23 MED ORDER — MENTHOL 3 MG MT LOZG
1.0000 | LOZENGE | OROMUCOSAL | Status: DC | PRN
  Filled 2020-08-23: qty 9

## 2020-08-23 MED ORDER — FENTANYL CITRATE (PF) 250 MCG/5ML IJ SOLN
INTRAMUSCULAR | Status: AC
  Filled 2020-08-23: qty 5

## 2020-08-23 MED ORDER — POLYETHYLENE GLYCOL 3350 17 G PO PACK: 17.0000 g | PACK | Freq: Every day | ORAL | Status: DC | PRN

## 2020-08-23 MED ORDER — CEFAZOLIN SODIUM-DEXTROSE 2-4 GM/100ML-% IV SOLN
2.0000 g | Freq: Three times a day (TID) | INTRAVENOUS | Status: AC
  Administered 2020-08-23 – 2020-08-24 (×2): 2 g via INTRAVENOUS
  Filled 2020-08-23 (×2): qty 100

## 2020-08-23 MED ORDER — ONDANSETRON HCL 4 MG/2ML IJ SOLN: 4.0000 mg | Freq: Four times a day (QID) | INTRAMUSCULAR | Status: DC | PRN

## 2020-08-23 MED ORDER — PHENYLEPHRINE HCL (PRESSORS) 10 MG/ML IV SOLN
INTRAVENOUS | Status: AC
  Filled 2020-08-23: qty 1

## 2020-08-23 MED ORDER — PANTOPRAZOLE SODIUM 40 MG PO TBEC
40.0000 mg | DELAYED_RELEASE_TABLET | Freq: Every day | ORAL | Status: DC
  Administered 2020-08-24 – 2020-08-28 (×5): 40 mg via ORAL
  Filled 2020-08-23 (×5): qty 1

## 2020-08-23 MED ORDER — BISACODYL 10 MG RE SUPP: 10.0000 mg | Freq: Every day | RECTAL | Status: DC | PRN

## 2020-08-23 MED ORDER — NIFEDIPINE ER OSMOTIC RELEASE 90 MG PO TB24
90.0000 mg | ORAL_TABLET | Freq: Every day | ORAL | Status: DC
  Administered 2020-08-24 – 2020-08-28 (×5): 90 mg via ORAL
  Filled 2020-08-23 (×5): qty 1

## 2020-08-23 MED ORDER — PROPOFOL 10 MG/ML IV BOLUS
INTRAVENOUS | Status: DC | PRN
  Administered 2020-08-23: 150 mg via INTRAVENOUS

## 2020-08-23 MED ORDER — DEXAMETHASONE SODIUM PHOSPHATE 10 MG/ML IJ SOLN
INTRAMUSCULAR | Status: AC
  Filled 2020-08-23: qty 1

## 2020-08-23 MED ORDER — METOPROLOL TARTRATE 25 MG PO TABS
25.0000 mg | ORAL_TABLET | Freq: Two times a day (BID) | ORAL | Status: DC
  Administered 2020-08-23 – 2020-08-28 (×10): 25 mg via ORAL
  Filled 2020-08-23 (×10): qty 1

## 2020-08-23 MED ORDER — GABAPENTIN 300 MG PO CAPS
300.0000 mg | ORAL_CAPSULE | Freq: Two times a day (BID) | ORAL | Status: DC
  Administered 2020-08-23 – 2020-08-28 (×10): 300 mg via ORAL
  Filled 2020-08-23 (×10): qty 1

## 2020-08-23 MED ORDER — FENTANYL CITRATE (PF) 100 MCG/2ML IJ SOLN
INTRAMUSCULAR | Status: AC
  Filled 2020-08-23: qty 2

## 2020-08-23 MED ORDER — ORAL CARE MOUTH RINSE: 15.0000 mL | Freq: Once | OROMUCOSAL | Status: AC

## 2020-08-23 MED ORDER — SODIUM CHLORIDE 0.9 % IV SOLN: 250.0000 mL | INTRAVENOUS | Status: DC

## 2020-08-23 MED ORDER — ALUM & MAG HYDROXIDE-SIMETH 200-200-20 MG/5ML PO SUSP: 30.0000 mL | Freq: Four times a day (QID) | ORAL | Status: DC | PRN

## 2020-08-23 MED ORDER — PHENYLEPHRINE HCL-NACL 10-0.9 MG/250ML-% IV SOLN
INTRAVENOUS | Status: DC | PRN
  Administered 2020-08-23: 25 ug/min via INTRAVENOUS

## 2020-08-23 MED ORDER — BUPIVACAINE HCL (PF) 0.5 % IJ SOLN
INTRAMUSCULAR | Status: AC
  Filled 2020-08-23: qty 30

## 2020-08-23 MED ORDER — ROCURONIUM BROMIDE 10 MG/ML (PF) SYRINGE
PREFILLED_SYRINGE | INTRAVENOUS | Status: AC
  Filled 2020-08-23: qty 10

## 2020-08-23 MED ORDER — FUROSEMIDE 40 MG PO TABS
80.0000 mg | ORAL_TABLET | Freq: Two times a day (BID) | ORAL | Status: DC
  Administered 2020-08-23 – 2020-08-28 (×10): 80 mg via ORAL
  Filled 2020-08-23 (×3): qty 2
  Filled 2020-08-23: qty 1
  Filled 2020-08-23 (×7): qty 2

## 2020-08-23 MED ORDER — LINAGLIPTIN 5 MG PO TABS
5.0000 mg | ORAL_TABLET | Freq: Every day | ORAL | Status: DC
  Administered 2020-08-23 – 2020-08-28 (×6): 5 mg via ORAL
  Filled 2020-08-23 (×5): qty 1

## 2020-08-23 MED ORDER — TAMSULOSIN HCL 0.4 MG PO CAPS
0.4000 mg | ORAL_CAPSULE | Freq: Every day | ORAL | Status: DC
  Administered 2020-08-24 – 2020-08-28 (×5): 0.4 mg via ORAL
  Filled 2020-08-23 (×5): qty 1

## 2020-08-23 MED ORDER — CHLORHEXIDINE GLUCONATE CLOTH 2 % EX PADS: 6.0000 | MEDICATED_PAD | Freq: Once | CUTANEOUS | Status: DC

## 2020-08-23 MED ORDER — CHLORHEXIDINE GLUCONATE 0.12 % MT SOLN
15.0000 mL | Freq: Once | OROMUCOSAL | Status: AC
  Administered 2020-08-23: 15 mL via OROMUCOSAL
  Filled 2020-08-23: qty 15

## 2020-08-23 MED ORDER — ADULT MULTIVITAMIN W/MINERALS CH
1.0000 | ORAL_TABLET | Freq: Every day | ORAL | Status: DC
  Administered 2020-08-24 – 2020-08-28 (×5): 1 via ORAL
  Filled 2020-08-23 (×5): qty 1

## 2020-08-23 MED ORDER — DOCUSATE SODIUM 100 MG PO CAPS
100.0000 mg | ORAL_CAPSULE | Freq: Two times a day (BID) | ORAL | Status: DC
  Administered 2020-08-24 – 2020-08-28 (×8): 100 mg via ORAL
  Filled 2020-08-23 (×9): qty 1

## 2020-08-23 MED ORDER — SODIUM BICARBONATE 650 MG PO TABS
650.0000 mg | ORAL_TABLET | Freq: Two times a day (BID) | ORAL | Status: DC
  Administered 2020-08-23 – 2020-08-28 (×10): 650 mg via ORAL
  Filled 2020-08-23 (×10): qty 1

## 2020-08-23 MED ORDER — PANTOPRAZOLE SODIUM 40 MG IV SOLR: 40.0000 mg | Freq: Every day | INTRAVENOUS | Status: DC

## 2020-08-23 MED ORDER — ACETAMINOPHEN 325 MG PO TABS: 650.0000 mg | ORAL_TABLET | ORAL | Status: DC | PRN

## 2020-08-23 MED ORDER — INSULIN ASPART 100 UNIT/ML ~~LOC~~ SOLN
0.0000 [IU] | Freq: Three times a day (TID) | SUBCUTANEOUS | Status: DC
  Administered 2020-08-24: 2 [IU] via SUBCUTANEOUS
  Administered 2020-08-24 – 2020-08-25 (×2): 3 [IU] via SUBCUTANEOUS
  Administered 2020-08-25 – 2020-08-26 (×3): 2 [IU] via SUBCUTANEOUS

## 2020-08-23 MED ORDER — LAMOTRIGINE 25 MG PO TABS
25.0000 mg | ORAL_TABLET | Freq: Two times a day (BID) | ORAL | Status: DC
  Administered 2020-08-23 – 2020-08-28 (×10): 25 mg via ORAL
  Filled 2020-08-23 (×11): qty 1

## 2020-08-23 MED ORDER — KCL IN DEXTROSE-NACL 20-5-0.45 MEQ/L-%-% IV SOLN
INTRAVENOUS | Status: DC
  Filled 2020-08-23 (×5): qty 1000

## 2020-08-23 MED ORDER — SODIUM CHLORIDE 0.9% FLUSH
3.0000 mL | Freq: Two times a day (BID) | INTRAVENOUS | Status: DC
  Administered 2020-08-23 – 2020-08-28 (×7): 3 mL via INTRAVENOUS

## 2020-08-23 MED ORDER — FENTANYL CITRATE (PF) 250 MCG/5ML IJ SOLN
INTRAMUSCULAR | Status: DC | PRN
Start: 2020-08-23 — End: 2020-08-23

## 2020-08-23 MED ORDER — OXYCODONE HCL 5 MG PO TABS
5.0000 mg | ORAL_TABLET | ORAL | Status: DC | PRN
  Administered 2020-08-23 – 2020-08-24 (×2): 5 mg via ORAL
  Filled 2020-08-23 (×2): qty 1

## 2020-08-23 MED ORDER — LABETALOL HCL 5 MG/ML IV SOLN
10.0000 mg | Freq: Once | INTRAVENOUS | Status: AC
  Administered 2020-08-23: 10 mg via INTRAVENOUS

## 2020-08-23 MED ORDER — ACETAMINOPHEN 650 MG RE SUPP: 650.0000 mg | RECTAL | Status: DC | PRN

## 2020-08-23 MED ORDER — 0.9 % SODIUM CHLORIDE (POUR BTL) OPTIME
TOPICAL | Status: DC | PRN
  Administered 2020-08-23: 1000 mL

## 2020-08-23 MED ORDER — ASCORBIC ACID 500 MG PO TABS
500.0000 mg | ORAL_TABLET | Freq: Every day | ORAL | Status: DC
  Administered 2020-08-24 – 2020-08-28 (×5): 500 mg via ORAL
  Filled 2020-08-23 (×5): qty 1

## 2020-08-23 MED ORDER — MIDAZOLAM HCL 2 MG/2ML IJ SOLN
INTRAMUSCULAR | Status: AC
  Filled 2020-08-23: qty 2

## 2020-08-23 MED ORDER — VITAMIN D 25 MCG (1000 UNIT) PO TABS
1000.0000 [IU] | ORAL_TABLET | Freq: Every day | ORAL | Status: DC
  Administered 2020-08-24 – 2020-08-28 (×5): 1000 [IU] via ORAL
  Filled 2020-08-23 (×5): qty 1

## 2020-08-23 MED ORDER — ISOSORBIDE MONONITRATE ER 30 MG PO TB24
30.0000 mg | ORAL_TABLET | Freq: Every day | ORAL | Status: DC
  Administered 2020-08-24 – 2020-08-28 (×5): 30 mg via ORAL
  Filled 2020-08-23 (×5): qty 1

## 2020-08-23 MED ORDER — GLYCOPYRROLATE PF 0.2 MG/ML IJ SOSY
PREFILLED_SYRINGE | INTRAMUSCULAR | Status: DC | PRN
  Administered 2020-08-23: .2 mg via INTRAVENOUS

## 2020-08-23 MED ORDER — ONDANSETRON HCL 4 MG/2ML IJ SOLN: 4.0000 mg | Freq: Once | INTRAMUSCULAR | Status: DC | PRN

## 2020-08-23 SURGICAL SUPPLY — 62 items
BAND RUBBER #18 3X1/16 STRL (MISCELLANEOUS) ×6 IMPLANT
BASKET BONE COLLECTION (BASKET) IMPLANT
BIT DRILL NEURO 2X3.1 SFT TUCH (MISCELLANEOUS) ×1 IMPLANT
BIT DRILL OZARK SU 2.3X14 (DRILL) ×1 IMPLANT
BLADE ULTRA TIP 2M (BLADE) IMPLANT
BNDG GAUZE ELAST 4 BULKY (GAUZE/BANDAGES/DRESSINGS) IMPLANT
BUR BARREL STRAIGHT FLUTE 4.0 (BURR) ×3 IMPLANT
CANISTER SUCT 3000ML PPV (MISCELLANEOUS) ×3 IMPLANT
CARTRIDGE OIL MAESTRO DRILL (MISCELLANEOUS) ×1 IMPLANT
COVER MAYO STAND STRL (DRAPES) ×3 IMPLANT
COVER WAND RF STERILE (DRAPES) IMPLANT
DECANTER SPIKE VIAL GLASS SM (MISCELLANEOUS) ×3 IMPLANT
DERMABOND ADHESIVE PROPEN (GAUZE/BANDAGES/DRESSINGS) ×2
DERMABOND ADVANCED (GAUZE/BANDAGES/DRESSINGS) ×2
DERMABOND ADVANCED .7 DNX12 (GAUZE/BANDAGES/DRESSINGS) ×1 IMPLANT
DERMABOND ADVANCED .7 DNX6 (GAUZE/BANDAGES/DRESSINGS) ×1 IMPLANT
DIFFUSER DRILL AIR PNEUMATIC (MISCELLANEOUS) ×3 IMPLANT
DRAPE HALF SHEET 40X57 (DRAPES) ×3 IMPLANT
DRAPE LAPAROTOMY 100X72 PEDS (DRAPES) ×3 IMPLANT
DRAPE MICROSCOPE LEICA (MISCELLANEOUS) ×3 IMPLANT
DRILL NEURO 2X3.1 SOFT TOUCH (MISCELLANEOUS) ×3
DRILL OZARK SU 2.3X14 (DRILL) ×3
DRSG OPSITE POSTOP 3X4 (GAUZE/BANDAGES/DRESSINGS) ×3 IMPLANT
DURAPREP 6ML APPLICATOR 50/CS (WOUND CARE) ×3 IMPLANT
ELECT COATED BLADE 2.86 ST (ELECTRODE) ×3 IMPLANT
ELECT REM PT RETURN 9FT ADLT (ELECTROSURGICAL) ×3
ELECTRODE REM PT RTRN 9FT ADLT (ELECTROSURGICAL) ×1 IMPLANT
GAUZE 4X4 16PLY RFD (DISPOSABLE) IMPLANT
GAUZE SPONGE 4X4 12PLY STRL (GAUZE/BANDAGES/DRESSINGS) IMPLANT
GLOVE BIO SURGEON STRL SZ8 (GLOVE) ×3 IMPLANT
GLOVE BIOGEL PI IND STRL 8 (GLOVE) ×1 IMPLANT
GLOVE BIOGEL PI IND STRL 8.5 (GLOVE) ×1 IMPLANT
GLOVE BIOGEL PI INDICATOR 8 (GLOVE) ×2
GLOVE BIOGEL PI INDICATOR 8.5 (GLOVE) ×2
GLOVE ECLIPSE 8.0 STRL XLNG CF (GLOVE) ×3 IMPLANT
GLOVE EXAM NITRILE XL STR (GLOVE) IMPLANT
GOWN STRL REUS W/ TWL LRG LVL3 (GOWN DISPOSABLE) IMPLANT
GOWN STRL REUS W/ TWL XL LVL3 (GOWN DISPOSABLE) IMPLANT
GOWN STRL REUS W/TWL 2XL LVL3 (GOWN DISPOSABLE) IMPLANT
GOWN STRL REUS W/TWL LRG LVL3 (GOWN DISPOSABLE)
GOWN STRL REUS W/TWL XL LVL3 (GOWN DISPOSABLE)
HALTER HD/CHIN CERV TRACTION D (MISCELLANEOUS) ×3 IMPLANT
HEMOSTAT POWDER KIT SURGIFOAM (HEMOSTASIS) IMPLANT
KIT BASIN OR (CUSTOM PROCEDURE TRAY) ×3 IMPLANT
KIT TURNOVER KIT B (KITS) ×3 IMPLANT
NEEDLE HYPO 25X1 1.5 SAFETY (NEEDLE) ×3 IMPLANT
NEEDLE SPNL 22GX3.5 QUINCKE BK (NEEDLE) ×3 IMPLANT
NS IRRIG 1000ML POUR BTL (IV SOLUTION) ×3 IMPLANT
OIL CARTRIDGE MAESTRO DRILL (MISCELLANEOUS) ×3
PACK LAMINECTOMY NEURO (CUSTOM PROCEDURE TRAY) ×3 IMPLANT
PAD ARMBOARD 7.5X6 YLW CONV (MISCELLANEOUS) ×9 IMPLANT
PIN DISTRACTION 14MM (PIN) ×6 IMPLANT
PLATE CERV CONS OZARK 1X20 (Plate) ×3 IMPLANT
SCREW VA ST OZARK 4X14 (Screw) ×12 IMPLANT
SPACER CERV AVS 14X16X5MM 4D (Spacer) ×3 IMPLANT
SPONGE INTESTINAL PEANUT (DISPOSABLE) ×3 IMPLANT
SPONGE SURGIFOAM ABS GEL SZ50 (HEMOSTASIS) ×3 IMPLANT
STAPLER SKIN PROX WIDE 3.9 (STAPLE) IMPLANT
SUT VIC AB 3-0 SH 8-18 (SUTURE) ×6 IMPLANT
TOWEL GREEN STERILE (TOWEL DISPOSABLE) ×3 IMPLANT
TOWEL GREEN STERILE FF (TOWEL DISPOSABLE) ×3 IMPLANT
WATER STERILE IRR 1000ML POUR (IV SOLUTION) ×3 IMPLANT

## 2020-08-23 NOTE — Transfer of Care (Signed)
Immediate Anesthesia Transfer of Care Note  Patient: Steve [name]  Procedure(s) Performed: Cervical 3-4 Anterior cervical decompression/discectomy/fusion (N/A )  Patient Location: PACU  Anesthesia Type:General  Level of Consciousness: awake, patient cooperative and confused  Airway & Oxygen Therapy: Patient Spontanous Breathing and Patient connected to face mask oxygen  Post-op Assessment: Report given to RN, Post -op Vital signs reviewed and stable and Patient moving all extremities X 4  Post vital signs: Reviewed and stable  Last Vitals:  Vitals Value Taken Time  BP 160/72 08/23/20 1649  Temp    Pulse 87 08/23/20 1654  Resp 14 08/23/20 1654  SpO2 98 % 08/23/20 1654  Vitals shown include unvalidated device data.  Last Pain:  Vitals:   08/23/20 1350  TempSrc:   PainSc: 0-No pain      Patients Stated Pain Goal: 0 (63/33/54 5625)  Complications: No complications documented.

## 2020-08-23 NOTE — Progress Notes (Signed)
Patient felt like his blood sugar was low this am. CBG 82 at home. Patient consumed 4oz cranberry juice at 0600. Anesthesia made aware. No new orders received at this time.

## 2020-08-23 NOTE — Interval H&P Note (Signed)
History and Physical Interval Note:  08/23/2020 2:30 PM  Steve [name]  has presented today for surgery, with the diagnosis of Cervical spinal stenosis.  The various methods of treatment have been discussed with the patient and family. After consideration of risks, benefits and other options for treatment, the patient has consented to  Procedure(s) with comments: Cervical 3-4 Anterior cervical decompression/discectomy/fusion (N/A) - 3C/RM 21 to follow as a surgical intervention.  The patient's history has been reviewed, patient examined, no change in status, stable for surgery.  I have reviewed the patient's chart and labs.  Questions were answered to the patient's satisfaction.     Peggyann Shoals

## 2020-08-23 NOTE — H&P (Signed)
[name] ID:   [id number] [name]: Steve [name]  Date of Birth: 05/06/1936 Visit Type: Office Visit   Date: 07/18/2020 11:30 AM Provider: Marchia Meiers. Vertell Limber MD   This 84 year old male presents for neck pain.  HISTORY OF PRESENT ILLNESS: 1.  neck pain  Steve [name] is an 84 year old male who was referred for neurosurgical evaluation by Dr. Merlene Laughter for left lower extremity weakness and gait abnormality.  Steve [name] reports that he began having difficulty with his left lower extremity about 2 years ago and has progressively worsened since onset.  He reports now he has difficulty with lifting his left lower extremity and walking.  He is now using a cane to aid in ambulation and stated "my left leg does he does not move. " He does report occasional neck pain that that radiates into his right shoulder and right upper arm that he rates at a 6/10 pain and happens a few times per week.  He also reports occasional low back pain with right upper extremity radiculopathy mostly affecting his right thigh and reports Steve pain to be around a 6/10.  He denies any left lower extremity radiculopathy.  Past medical history:  DM2, peripheral neuropathy, vertigo, shortness of breath, decreased kidney function ([name] is unsure of his diagnosis)       PAST MEDICAL/SURGICAL HISTORY:   (Detailed)      Family History:  (Detailed)   Social History:  (Detailed) Tobacco use reviewed. Preferred language is Unknown.   Smoking status: Former smoker.  SMOKING STATUS Type Smoking Status Usage Per Day Years Used Total Pack Years  Former smoker         MEDICATIONS: (added, continued or stopped this visit)   ALLERGIES: Ingredient Reaction Medication Name Comment NO KNOWN ALLERGIES    No known allergies. Reviewed, updated.    PHYSICAL EXAM:  Vitals Date Temp F BP Pulse Ht In Wt Lb BMI BSA Pain Score 07/18/2020  162/68 64 65 174 28.95  0/10   PHYSICAL EXAM  Details General Level of Distress: no acute distress Overall Appearance: normal    Cardiovascular Cardiac: regular rate and rhythm without murmur  Right Left  Carotid Pulses: normal normal  Respiratory Lungs: clear to auscultation  Neurological Orientation: normal Recent and Remote Memory: normal Attention Span and Concentration:   normal Language: normal Fund of Knowledge: normal  Right Left Sensation: normal normal Upper Extremity Coordination: normal normal  Lower Extremity Coordination: normal normal  Musculoskeletal Gait and Station: normal  Right Left Upper Extremity Muscle Strength: normal normal Lower Extremity Muscle Strength: normal normal Upper Extremity Muscle Tone:  normal normal Lower Extremity Muscle Tone: normal normal   Motor Strength Upper and lower extremity motor strength was tested in Steve clinically pertinent muscles. Any abnormal findings will be noted below.   Right Left Finger Extensor: 4/5 4/5 Hip Flexor:  4+/5   Deep Tendon Reflexes  Right Left Biceps: normal normal Triceps: normal normal Brachioradialis: normal normal Patellar: increase increase Achilles: increase increase  Sensory Sensation was tested at C2 to T1.   Cranial Nerves II. Optic Nerve/Visual Fields: normal III. Oculomotor: normal IV. Trochlear: normal V. Trigeminal: normal VI. Abducens: normal VII. Facial: normal VIII. Acoustic/Vestibular: normal IX. Glossopharyngeal: normal X. Vagus: normal XI. Spinal Accessory: normal XII. Hypoglossal: normal  Motor and other Tests Lhermittes: negative Rhomberg: negative Pronator drift: absent     Right Left Spurlings negative negative Hoffman's: present present Clonus: normal normal Babinski: normal normal SLR: negative negative Patrick's Corky Sox): negative negative Toe  Walk: normal normal Toe Lift: normal normal Heel Walk: normal normal Tinels Elbow: negative negative Tinels  Wrist: negative negative Phalen: negative negative   Additional Findings:  Suprapatellar reflex 3+   DIAGNOSTIC RESULTS:  Noncontrast cervical MRI 7/72021 shows multilevel degenerative and arthritic changes throughout Steve cervical spine.  C3-4 C4 has severe disc degeneration with loss of disc height and a grade 1 retrolisthesis all leading to severe spinal canal stenosis with mild to moderate spinal cord compression and a hyper intense signal abnormality within Steve spinal cord at this level. C5-6 severe disc degeneration and arthritic changes causing multifactorial moderate spinal stenosis with mild spinal cord compression and severe bilateral foraminal stenosis.  Noncontrast thoracic MRI 04/18/2020 reveals multilevel spondylosis.  T2-3 with disc degeneration with loss of disc height with resultant disc bulge and central disc protrusion causing moderate spinal canal stenosis.  Noncontrast lumbar CT 12/25/2019 showed multilevel degenerative discs and spondylosis throughout his lumbar spine, worse at Steve L4-5 where there is right sided subarticular stenosis due to an osteophyte off of Steve right facet joint.  Mild to moderate left foraminal stenosis L3-4.  Moderate bilateral foraminal stenosis at L5-S1    IMPRESSION:  Steve [name] is having 6/10 cervical neck pain with right upper extremity radiculopathy.  He is experiencing weakness in his arms and legs.  His exam is most remarkable for significant hyper reflexia in his bilateral lower extremities with a suprapatellar reflex 3+ and positive Hoffmann sign on Steve right.  Left hand intrinsics 4+/5, right hand intrinsics 4/5.  His imaging is most remarkable for herniated discs at Steve C3-4 level causing significant cervical stenosis with spinal cord compression and signal change at this level.  Cervical stenosis measured at 6.35 mm. He also has significant cervical stenosis at Steve C4-5 and C5-6 level as well as bilateral foraminal stenosis.  However,  given that Steve [name] has such an extensive medical history and chronic kidney disease, I believe Steve [name] should undergo decompression and fusion of Steve C3-4 level to relieve Steve pressure off Steve spinal cord.  PLAN: Steve [name] wishes to proceed with surgery.  This will consist of C3-4 ACDF.  Risks and benefits were discussed in detail with Steve [name] and he wishes to proceed with surgery.  Detailed [name] education was performed today.  All his questions were answered.  Steve [name] will need nephrology clearance prior to proceeding with surgery.  His nephrologist is Dr. Greig Right at [facility] in [place].  C3-4 ACDF tentatively scheduled for 08/23/2020 at [facility].  Prescription for soft collar provided to [name] while in Steve office today.  He will follow-up in Steve office 3 weeks after his surgery with a/P and lateral radiographs.  Orders: Diagnostic Procedures: Assessment Procedure M48.02 Cervical Spine- AP/Lat M54.2 Cervical Spine- AP/Lat/Flex/Ex Instruction(s)/Education: Assessment Instruction I10 Lifestyle education [phone] Dietary management education, guidance, and counseling Miscellaneous: Assessment  M48.02 Soft Cervical Collar  Completed Orders (this encounter) Order Details Reason Side Interpretation Result Initial Treatment Date Region Lifestyle education [name] will follow up with Primary Care Physician.       Dietary management education, guidance, and counseling Encouraged [name] to eat well balanced diet.       Cervical Spine- AP/Lat/Flex/Ex W/SWIMMER'S     07/18/2020 All Levels to All Levels  Assessment/Plan  # Detail Type Description  1. Assessment Cervical spinal stenosis (M48.02).  Plan Orders Soft Cervical Collar.     2. Assessment Cervical myelopathy with cervical radiculopathy (G95.9).  3. Assessment Radiculopathy, cervical region  (M54.12).     4. Assessment Cervical spondylosis (M47.812).     5. Assessment Cervicalgia (M54.2).     6. Assessment Essential (primary) hypertension (I10).     7. Assessment Body mass index (BMI) 28.0-28.9, adult (N18.33).  Plan Orders Today's instructions / counseling include(s) Dietary management education, guidance, and counseling. Clinical information/comments: Encouraged [name] to eat well balanced diet.       Pain Management Plan Pain Scale: 0/10. Method: Numeric Pain Intensity Scale.              Provider:  Marchia Meiers. Vertell Limber MD  07/19/2020 12:38 PM    Dictation edited by: Marchia Meiers. Vertell Limber    CC Providers: [facility] [address] [place], [place] [address]-               Electronically signed by Marchia Meiers. Vertell Limber MD on 07/19/2020 12:38 PM

## 2020-08-23 NOTE — Op Note (Signed)
08/23/2020  4:47 PM  PATIENT:  Steve [name]  84 y.o. male  PRE-OPERATIVE DIAGNOSIS:  Cervical spinal stenosis with cord compression, cord signal change, myelopathy C 34 level  POST-OPERATIVE DIAGNOSIS:  Cervical spinal stenosis with cord compression, cord signal change, myelopathy C 34 level   PROCEDURE:  Procedure(s) with comments: Cervical 3-4 Anterior cervical decompression/discectomy/fusion (N/A) - 3C/RM 21 to follow with PEEK cage, autograft, anterior cervical plate  SURGEON:  Surgeon(s) and Role:    Erline Levine, MD - Primary    * Dawley, Theodoro Doing, DO - Assisting  PHYSICIAN ASSISTANT:   ASSISTANTS: Poteat, RN   ANESTHESIA:   general  EBL:  20 mL   BLOOD ADMINISTERED:none  DRAINS: none   LOCAL MEDICATIONS USED:  MARCAINE    and LIDOCAINE   SPECIMEN:  No Specimen  DISPOSITION OF SPECIMEN:  N/A  COUNTS:  YES  TOURNIQUET:  * No tourniquets in log *  DICTATION: Patient is 84 year old male with large disc herniation of C3 on 4 with stenosis, myelopathy, cord compression, cord signal changes.  It was elected to take him to surgery for anterior cervical decompression and fusion C 34 level.  PROCEDURE: Patient was brought to operating room and following the smooth and uncomplicated induction of general endotracheal anesthesia his head was placed on a horseshoe head holder he was placed in 5 pounds of Holter traction and his anterior neck was prepped and draped in usual sterile fashion. An incision was made on the left side of midline after infiltrating the skin and subcutaneous tissues with local lidocaine. The platysmal layer was incised and subplatysmal dissection was performed exposing the anterior border sternocleidomastoid muscle. Using blunt dissection the carotid sheath was kept lateral and trachea and esophagus kept medial exposing the anterior cervical spine. A bent spinal needle was placed it was felt to be the C 34 level and this was confirmed on intraoperative  x-ray. Longus coli muscles were taken down from the anterior cervical spine using electrocautery and key elevator and self-retaining retractor was placed exposing the C 34 level. The interspace was incised and a thorough discectomy was performed. Distraction pins were placed. Uncinate spurs and central spondylitic ridges were drilled down with a high-speed drill. The spinal cord dura and both C4 nerve roots were widely decompressed. Hemostasis was assured. After trial sizing a 5 mm lordotic large footprint PEEK cage was selected and packed with local autograft. This was tamped into position and countersunk appropriately. Distraction weight was removed. A 20 mm Ozark anterior cervical plate was affixed to the cervical spine with 14 mm variable-angle screws 2 at C3, 2 at C4. All screws were well-positioned and locking mechanisms were engaged. A final X ray was obtained which showed well positioned graft and anterior plate without complicating features. Soft tissues were inspected and found to be in good repair. The wound was irrigated. The platysma layer was closed with 3-0 Vicryl stitches and the skin was reapproximated with 3-0 Vicryl subcuticular stitches. The wound was dressed with Dermabond and an occlusive dressing. Counts were correct at the end of the case. Patient was extubated and taken to recovery in stable and satisfactory condition.   PLAN OF CARE: Admit for overnight observation  PATIENT DISPOSITION:  PACU - hemodynamically stable.   Delay start of Pharmacological VTE agent (>24hrs) due to surgical blood loss or risk of bleeding: yes

## 2020-08-23 NOTE — Anesthesia Procedure Notes (Signed)
Procedure Name: Intubation Date/Time: 08/23/2020 3:13 PM Performed by: Renato Shin, CRNA Pre-anesthesia Checklist: Patient identified, Emergency Drugs available, Suction available and Patient being monitored Patient Re-evaluated:Patient Re-evaluated prior to induction Oxygen Delivery Method: Circle system utilized Preoxygenation: Pre-oxygenation with 100% oxygen Induction Type: IV induction Ventilation: Mask ventilation without difficulty Laryngoscope Size: Glidescope and 4 Grade View: Grade I Tube type: Oral Tube size: 7.5 mm Number of attempts: 1 Airway Equipment and Method: Oral airway,  Video-laryngoscopy and Rigid stylet Placement Confirmation: ETT inserted through vocal cords under direct vision,  positive ETCO2 and breath sounds checked- equal and bilateral Secured at: 23 cm Tube secured with: Tape Dental Injury: Teeth and Oropharynx as per pre-operative assessment  Difficulty Due To: Difficult Airway- due to reduced neck mobility and Difficult Airway-  due to neck instability Future Recommendations: Recommend- induction with short-acting agent, and alternative techniques readily available Comments: CSpine neutral throughout DL

## 2020-08-23 NOTE — Brief Op Note (Signed)
08/23/2020  4:47 PM  PATIENT:  Steve [name]  84 y.o. male  PRE-OPERATIVE DIAGNOSIS:  Cervical spinal stenosis with cord compression, cord signal change, myelopathy C 34 level  POST-OPERATIVE DIAGNOSIS:  Cervical spinal stenosis with cord compression, cord signal change, myelopathy C 34 level   PROCEDURE:  Procedure(s) with comments: Cervical 3-4 Anterior cervical decompression/discectomy/fusion (N/A) - 3C/RM 21 to follow with PEEK cage, autograft, anterior cervical plate  SURGEON:  Surgeon(s) and Role:    Erline Levine, MD - Primary    * Dawley, Theodoro Doing, DO - Assisting  PHYSICIAN ASSISTANT:   ASSISTANTS: Poteat, RN   ANESTHESIA:   general  EBL:  20 mL   BLOOD ADMINISTERED:none  DRAINS: none   LOCAL MEDICATIONS USED:  MARCAINE    and LIDOCAINE   SPECIMEN:  No Specimen  DISPOSITION OF SPECIMEN:  N/A  COUNTS:  YES  TOURNIQUET:  * No tourniquets in log *  DICTATION: Patient is 84 year old male with large disc herniation of C3 on 4 with stenosis, myelopathy, cord compression, cord signal changes.  It was elected to take him to surgery for anterior cervical decompression and fusion C 34 level.  PROCEDURE: Patient was brought to operating room and following the smooth and uncomplicated induction of general endotracheal anesthesia his head was placed on a horseshoe head holder he was placed in 5 pounds of Holter traction and his anterior neck was prepped and draped in usual sterile fashion. An incision was made on the left side of midline after infiltrating the skin and subcutaneous tissues with local lidocaine. The platysmal layer was incised and subplatysmal dissection was performed exposing the anterior border sternocleidomastoid muscle. Using blunt dissection the carotid sheath was kept lateral and trachea and esophagus kept medial exposing the anterior cervical spine. A bent spinal needle was placed it was felt to be the C 34 level and this was confirmed on intraoperative  x-ray. Longus coli muscles were taken down from the anterior cervical spine using electrocautery and key elevator and self-retaining retractor was placed exposing the C 34 level. The interspace was incised and a thorough discectomy was performed. Distraction pins were placed. Uncinate spurs and central spondylitic ridges were drilled down with a high-speed drill. The spinal cord dura and both C4 nerve roots were widely decompressed. Hemostasis was assured. After trial sizing a 5 mm lordotic large footprint PEEK cage was selected and packed with local autograft. This was tamped into position and countersunk appropriately. Distraction weight was removed. A 20 mm Ozark anterior cervical plate was affixed to the cervical spine with 14 mm variable-angle screws 2 at C3, 2 at C4. All screws were well-positioned and locking mechanisms were engaged. A final X ray was obtained which showed well positioned graft and anterior plate without complicating features. Soft tissues were inspected and found to be in good repair. The wound was irrigated. The platysma layer was closed with 3-0 Vicryl stitches and the skin was reapproximated with 3-0 Vicryl subcuticular stitches. The wound was dressed with Dermabond and an occlusive dressing. Counts were correct at the end of the case. Patient was extubated and taken to recovery in stable and satisfactory condition.   PLAN OF CARE: Admit for overnight observation  PATIENT DISPOSITION:  PACU - hemodynamically stable.   Delay start of Pharmacological VTE agent (>24hrs) due to surgical blood loss or risk of bleeding: yes

## 2020-08-23 NOTE — Anesthesia Postprocedure Evaluation (Signed)
Anesthesia Post Note  Patient: Altus Rybacki  Procedure(s) Performed: Cervical 3-4 Anterior cervical decompression/discectomy/fusion (N/A )     Patient location during evaluation: PACU Anesthesia Type: General Level of consciousness: awake and alert, patient cooperative and oriented Pain management: pain level controlled Vital Signs Assessment: post-procedure vital signs reviewed and stable Respiratory status: spontaneous breathing, nonlabored ventilation and respiratory function stable Cardiovascular status: blood pressure returned to baseline and stable Postop Assessment: no apparent nausea or vomiting Anesthetic complications: no   No complications documented.  Last Vitals:  Vitals:   08/23/20 1750 08/23/20 1820  BP: (!) 153/84 (!) 158/86  Pulse: 81 81  Resp: 16 10  Temp: (!) 36.3 C   SpO2: 99% 99%    Last Pain:  Vitals:   08/23/20 1750  TempSrc:   PainSc: 0-No pain                 Joelene Barriere,E. Ramonda Galyon

## 2020-08-23 NOTE — Progress Notes (Signed)
Awake, alert, conversant.  Improved strength both hand intrinsics.  MAEW with good power.  Doing well.

## 2020-08-24 DIAGNOSIS — Z7982 Long term (current) use of aspirin: Secondary | ICD-10-CM | POA: Diagnosis not present

## 2020-08-24 DIAGNOSIS — E119 Type 2 diabetes mellitus without complications: Secondary | ICD-10-CM | POA: Diagnosis not present

## 2020-08-24 DIAGNOSIS — M4712 Other spondylosis with myelopathy, cervical region: Secondary | ICD-10-CM | POA: Diagnosis not present

## 2020-08-24 DIAGNOSIS — Z87891 Personal history of nicotine dependence: Secondary | ICD-10-CM | POA: Diagnosis not present

## 2020-08-24 DIAGNOSIS — G959 Disease of spinal cord, unspecified: Secondary | ICD-10-CM | POA: Diagnosis not present

## 2020-08-24 LAB — GLUCOSE, CAPILLARY: Glucose-Capillary: 111 mg/dL — ABNORMAL HIGH (ref 70–99)

## 2020-08-24 MED ORDER — TRAMADOL HCL 50 MG PO TABS: 50.0000 mg | ORAL_TABLET | Freq: Two times a day (BID) | ORAL | Status: DC | PRN

## 2020-08-24 NOTE — TOC Initial Note (Addendum)
Transition of Care [facility]) - Initial/Assessment Note    Patient Details  Name: Steve [name] MRN: [id number] Date of Birth: 1936-09-22  [facility] CM/SW Contact:    Sharin Mons, RN Phone Number:  [phone] 08/24/2020, 2:01 PM  Clinical Narrative:            Pt presents with cervical spinal stenosis. From home alone. PTA states required min. assist with ADL's. Pt states  already has W/C and rolling walker.       - Cervical 3-4 Anterior cervical decompression/discectomy/fusion, 08/23/2020  NCM shared PT /OT recommendations  for home health services. Pt agreeable to home health services. Choice provided. Pt without preference referral made with [facility] .... acceptance pending. Pt will need HH orders/ face to face from MD for home health services.   DME ( 3in1/BSC , shower bench) needs noted . Referral made with [facility]. Equipment will be delivered to bedside prior to d/c.   Pt states once d/c lady friend Merdis Delay will provide supervision and assistance needed. NCM confirmed plan with [facility] [phone]).   Referral made with [facility] for community support.  Pt without Rx Med concerns or affordability issues.  TOC team will continue to monitor for needs.   Expected Discharge Plan: [place] Barriers to Discharge: Continued Medical Work up   Patient Goals and CMS Choice Patient states their goals for this hospitalization and ongoing recovery are:: to get better   Choice offered to / list presented to : Patient  Expected Discharge Plan and Services Expected Discharge Plan: [place]   Discharge Planning Services: CM Consult Post Acute Care Choice: Durable Medical Equipment Living arrangements for the past 2 months: Single Family Home                           HH Arranged: PT, OT [place] Agency: [place] ([place]) Date HH Agency Contacted: 08/24/20 Time HH Agency Contacted: 1400    Prior Living  Arrangements/Services Living arrangements for the past 2 months: [place] with:: Self Patient language and need for interpreter reviewed:: Yes Do you feel safe going back to the place where you live?: Yes      Need for Family Participation in Patient Care: Yes (Comment) Care giver support system in place?: No (comment) Current home services: DME (rolling walker , wheelchair) Criminal Activity/Legal Involvement Pertinent to Current Situation/Hospitalization: No - Comment as needed  Activities of Daily Living Home Assistive Devices/Equipment: CBG Meter, Eyeglasses, Cane (specify quad or straight), Wheelchair, Environmental consultant (specify type) ADL Screening (condition at time of admission) Patient's cognitive ability adequate to safely complete daily activities?: Yes Is the patient deaf or have difficulty hearing?: Yes Does the patient have difficulty seeing, even when wearing glasses/contacts?: No Does the patient have difficulty concentrating, remembering, or making decisions?: No Patient able to express need for assistance with ADLs?: Yes Does the patient have difficulty dressing or bathing?: No Independently performs ADLs?: Yes (appropriate for developmental age) Does the patient have difficulty walking or climbing stairs?: Yes Weakness of Legs: Both Weakness of Arms/Hands: Both  Permission Sought/Granted                  Emotional Assessment Appearance:: Appears stated age Attitude/Demeanor/Rapport: Engaged Affect (typically observed): Accepting Orientation: : Oriented to Self, Oriented to Place, Oriented to  Time, Oriented to Situation Alcohol / Substance Use: Not Applicable Psych Involvement: No (comment)  Admission  diagnosis:  Status post cervical discectomy [Z98.890] Cervical myelopathy ([place]) [G95.9] Patient Active Problem List   Diagnosis Date Noted  . Status post cervical discectomy 08/23/2020  . Cervical myelopathy ([place]) 08/23/2020  . Atypical chest pain  04/18/2020  . Acute kidney injury superimposed on CKD ([place]) 04/18/2020  . Gastroesophageal reflux disease   . Fall at home, initial encounter 07/26/2018  . Acute left-sided weakness 07/25/2018  . Hyperlipidemia 07/25/2018  . CKD (chronic kidney disease), stage IV ([place]) 05/13/2018  . Chronic kidney disease, stage 4 (severe) ([place]) 05/13/2018  . Chest pain 05/12/2018  . Elevated troponin 07/02/2017  . DM (diabetes mellitus), type 2 with renal complications ([place]) 84/78/4128  . IBS (irritable bowel syndrome) 01/28/2017  . Prostate cancer ([place]) 07/10/2014  . Abnormal ECG 04/12/2013  . HTN (hypertension) 04/12/2013  . SOB (shortness of breath) 04/12/2013  . Type 2 diabetes mellitus with diabetic nephropathy ([place]) 01/11/2013  . CHEST PAIN-UNSPECIFIED 06/12/2009   PCP:  Rosita Fire, MD Pharmacy:   [facility], [place] [address] [place]  [address] Phone: [phone] Fax: [phone]     Social Determinants of Health (SDOH) Interventions    Readmission Risk Interventions No flowsheet data found.

## 2020-08-24 NOTE — Progress Notes (Addendum)
Subjective: Patient reports that he has a sore throat and some difficulty swallowing.   Objective: Vital signs in last 24 hours: Temp:  [97.3 F (36.3 C)-98.6 F (37 C)] 97.9 F (36.6 C) (11/12 0500) Pulse Rate:  [67-87] 73 (11/12 0500) Resp:  [10-18] 16 (11/12 0500) BP: (147-177)/(58-87) 158/72 (11/12 0500) SpO2:  [95 %-100 %] 100 % (11/12 0500) Weight:  [76.5 kg] 76.5 kg (11/11 1355)  Intake/Output from previous day: 11/11 0701 - 11/12 0700 In: 1190 [P.O.:440; I.V.:550; IV Piggyback:200] Out: 1070 [Urine:1050; Blood:20] Intake/Output this shift: No intake/output data recorded.  Physical Exam: Awake, A/O X4, conversant, and in good spirits.  Improved strength in bilateral hand intrinsics.  MAEW with good power. Dressing is CDI.  Lab Results: No results for input(s): WBC, HGB, HCT, PLT in the last 72 hours. BMET No results for input(s): NA, K, CL, CO2, GLUCOSE, BUN, CREATININE, CALCIUM in the last 72 hours.  Studies/Results: DG Cervical Spine 2-3 Views  Result Date: 08/23/2020 CLINICAL DATA:  C3-4 ACDF EXAM: CERVICAL SPINE - 2-3 VIEW COMPARISON:  07/18/2020 FINDINGS: Three cross-table lateral views of the neck are obtained during the performance of the procedure. The initial image demonstrates surgical instrumentation at the C4/C5 disc space. The second image demonstrates instrumentation at the C3/C4 disc space. The final image demonstrates performance of an ACDF at C3/C4 with disc spacer and anterior fusion plate and screw fixation. Laparotomy pad in the prevertebral soft tissues. IMPRESSION: 1. C3/C4 ACDF as above. Electronically Signed   By: Randa Ngo M.D.   On: 08/23/2020 17:09    Assessment/Plan: Patient is doing well. He is having some dysphagia and sore throat. Ok to give patient Chloraseptic spray and Cepacol. Continue soft cervical collar. Encourage ambulation and mobility. Plan for discharge tomorrow.  LOS: 1 day    Marvis Moeller, DNP, NP-C 08/24/2020,  8:09 AM   Patient is doing well.  Myelopathy sym[toms are improved.

## 2020-08-24 NOTE — Care Management Obs Status (Signed)
[facility] NOTIFICATION   Patient Details  Name: Donold Marotto MRN: [id number] Date of Birth: [birth year]   Medicare Observation Status Notification Given:       Sharin Mons, RN 08/24/2020, 1:42 PM

## 2020-08-24 NOTE — Evaluation (Signed)
Physical Therapy Evaluation Patient Details Name: Steve [name] MRN: [id number] DOB: 07-Aug-1936 Today's Date: 08/24/2020   History of Present Illness  Pt is an 84 year old male who was referred for neurosurgical evaluation by Dr. Merlene Laughter for left lower extremity weakness and gait abnormality secondary to cervical spinal stenosis. Pt s/p C3-4 ACDF on 08/23/2020.  Clinical Impression  PTA, patient lives alone and uses [facility] for ambulation. Patient up in chair on arrival. Patient performed sit to stand transfer with min guard and no AD, use of momentum to stand. Patient ambulated 200' with IV pole and min guard, cues for clearing L LE. Patient notes improved drop foot on L with ambulation and states he wears a brace at home. Patient will benefit from skilled PT services during acute stay to address listed deficits (see PT problem list). Recommend HHPT following discharge to maximize functional independence and safety in home.    Follow Up Recommendations Home health PT    Equipment Recommendations  3in1 (PT)    Recommendations for Other Services       Precautions / Restrictions Precautions Precautions: Fall;Cervical Precaution Booklet Issued: Yes (comment) Required Braces or Orthoses: Cervical Brace Cervical Brace: Soft collar;At all times Restrictions Weight Bearing Restrictions: No      Mobility  Bed Mobility Overal bed mobility: Needs Assistance Bed Mobility: Rolling;Sidelying to Sit Rolling: Supervision Sidelying to sit: Min guard       General bed mobility comments: up in bed on arrival    Transfers Overall transfer level: Needs assistance Equipment used: None Transfers: Sit to/from Stand Sit to Stand: Min guard         General transfer comment: min guard for safety  Ambulation/Gait Ambulation/Gait assistance: Min guard Gait Distance (Feet): 200 Feet Assistive device: IV Pole Gait Pattern/deviations: Step-through pattern;Decreased dorsiflexion - left;Wide  base of support     General Gait Details: pt notes improved drop foot on L, patient able to clear L foot with ambulation with occassional instances of dragging toes during LE advancment.  Stairs            Wheelchair Mobility    Modified Rankin (Stroke Patients Only)       Balance Overall balance assessment: Needs assistance Sitting-balance support: No upper extremity supported;Feet supported Sitting balance-Leahy Scale: Good     Standing balance support: Single extremity supported;During functional activity Standing balance-Leahy Scale: Fair Standing balance comment: able to don pants in standing without UE support, benefits from UE support for dynamic tasks but no major LOB noted                             Pertinent Vitals/Pain Pain Assessment: Faces Pain Score: 4  Faces Pain Scale: Hurts a little bit Pain Location: neck Pain Descriptors / Indicators: Discomfort Pain Intervention(s): Limited activity within patient's tolerance;Monitored during session;Repositioned    Home Living Family/patient expects to be discharged to:: Private residence Living Arrangements: Alone Available Help at Discharge: Family;Friend(s);Available PRN/intermittently (nephew lives next door and other family members nearby) Type of Home: Apartment Home Access: Level entry     Home Layout: One level Home Equipment: Grab bars - tub/shower;Shower seat;Cane - single point;Daissy Yerian - 2 wheels Additional Comments: Nephew lives in apartment next door, pt reports he can stay with him or he stays with him as needed.     Prior Function Level of Independence: Independent with assistive device(s)         Comments: Modified Independent with use of  cane for mobility, hx of foot drop (has brace that he wears).     Hand Dominance   Dominant Hand: Right    Extremity/Trunk Assessment   Upper Extremity Assessment Upper Extremity Assessment: Defer to OT evaluation    Lower Extremity  Assessment Lower Extremity Assessment: Generalized weakness LLE Deficits / Details: foot drop    Cervical / Trunk Assessment Cervical / Trunk Assessment: Normal  Communication   Communication: No difficulties  Cognition Arousal/Alertness: Awake/alert Behavior During Therapy: WFL for tasks assessed/performed Overall Cognitive Status: Within Functional Limits for tasks assessed                                 General Comments: Alert, oriented and pleasant. Does need cues for maintaining precautions but able to self monitor and correct as needed      General Comments General comments (skin integrity, edema, etc.): Extended time spent educating on cervical precautions with ADLs, positioning, safety in home, IADLs and DME needs    Exercises     Assessment/Plan    PT Assessment Patient needs continued PT services  PT Problem List Decreased strength;Decreased activity tolerance;Decreased range of motion;Decreased balance;Decreased mobility;Decreased knowledge of precautions;Pain       PT Treatment Interventions Gait training;Functional mobility training;Therapeutic activities;Therapeutic exercise;Balance training;Patient/family education    PT Goals (Current goals can be found in the Care Plan section)  Acute Rehab PT Goals Patient Stated Goal: go home tomorrow PT Goal Formulation: With patient Time For Goal Achievement: 10/12/20 Potential to Achieve Goals: Good    Frequency Min 5X/week   Barriers to discharge        Co-evaluation               AM-PAC PT "6 Clicks" Mobility  Outcome Measure Help needed turning from your back to your side while in a flat bed without using bedrails?: A Little Help needed moving from lying on your back to sitting on the side of a flat bed without using bedrails?: A Little Help needed moving to and from a bed to a chair (including a wheelchair)?: A Little Help needed standing up from a chair using your arms (e.g.,  wheelchair or bedside chair)?: A Little Help needed to walk in hospital room?: A Little Help needed climbing 3-5 steps with a railing? : A Little 6 Click Score: 18    End of Session Equipment Utilized During Treatment: Gait belt;Cervical collar Activity Tolerance: Patient tolerated treatment well Patient left: in chair;with call bell/phone within reach;with chair alarm set Nurse Communication: Mobility status PT Visit Diagnosis: Unsteadiness on feet (R26.81);Other abnormalities of gait and mobility (R26.89);Muscle weakness (generalized) (M62.81)    Time: 4196-2229 PT Time Calculation (min) (ACUTE ONLY): 24 min   Charges:   PT Evaluation $PT Eval Low Complexity: 1 Low PT Treatments $Therapeutic Activity: 8-22 mins        Perrin Maltese, PT, DPT Acute Rehabilitation Services Pager [phone] Office [phone]   Chesley Noon K Allred 08/24/2020, 10:12 AM

## 2020-08-24 NOTE — Care Management CC44 (Signed)
Condition Code 44 Documentation Completed  Patient Details  Name: Steve [name] MRN: [id number] Date of Birth: December 11, 1935   Condition Code 44 given:  Yes Patient signature on Condition Code 44 notice:  Yes Documentation of 2 MD's agreement:  Yes Code 44 added to claim:  Yes    Sharin Mons, RN 08/24/2020, 1:42 PM

## 2020-08-24 NOTE — Consult Note (Signed)
   [facility] CM Inpatient Consult   08/24/2020  Oshea Percival Jan 11, 1936 [id number]   [place] [facility] [ACO] Patient:  Theme park manager Medicine  Referral from inpatient [facility] RNCM for added post hospital follow up support. Patient evaluated for community based chronic complex disease management services with [facility] as a benefit of patient's [employer]. Spoke with patient at the bedside HIPAA verified with patient's name, date of birth and apartment address verified to explain [facility] services.   Explained that the patient qualifies for benefits.  Patient states he will accept any help he can. Patient had been followed by [facility] for medication compliance.  Patient states between his friend, nephew and the nephew's  Girlfriend regarding transportation,food, and check ins.  Plan:  Gave patient a brochure and 24 hour nurse line and explained for potential follow up. Will follow for progress and needs.    Of note, [facility] Care Management services does not replace or interfere with any services that are arranged by inpatient case management or social work.  For additional questions or referrals please contact:    Natividad Brood, RN BSN [facility] Liaison  [phone] business mobile phone Toll free office [phone]  Fax number: [phone] [email or web address]  [email or web address]

## 2020-08-24 NOTE — Evaluation (Signed)
Occupational Therapy Evaluation Patient Details Name: Steve [name] MRN: [id number] DOB: 1935-12-21 Today's Date: 08/24/2020    History of Present Illness Pt is an 84 year old male who was referred for neurosurgical evaluation by Dr. Merlene Laughter for left lower extremity weakness and gait abnormality secondary to cervical spinal stenosis. Pt underwent anterior  cervical decompression/discectomy/fusion.   Clinical Impression   PTA, pt lives alone and reports Modified Independence in ADLs, IADLs and mobility with use of cane (and brace for foot drop). Pt presents now with deficits in strength, balance, pain and knowledge of precautions. Pt requires cues for maintaining cervical precautions but noted to self monitor and implement correction independently by end of session. Pt overall min guard for mobility, improved stability and posture with RW. Educated on cervical precautions for ADLs/IADLs (handout provided) with continued reinforcement needed. Pt requires Supervision for UB ADLs and Min A for LB ADLs. Pt reports nephew lives in same apartment complex and other family available to assist as needed. Pt would benefit from additional OT session prior to discharge to address tub transfers and LB ADLs while maintaining cervical precautions.     Follow Up Recommendations  Home health OT    Equipment Recommendations  Tub/shower bench    Recommendations for Other Services       Precautions / Restrictions Precautions Precautions: Fall;Cervical Precaution Booklet Issued: Yes (comment) Required Braces or Orthoses: Cervical Brace Cervical Brace: Soft collar;At all times Restrictions Weight Bearing Restrictions: No      Mobility Bed Mobility Overal bed mobility: Needs Assistance Bed Mobility: Rolling;Sidelying to Sit Rolling: Supervision Sidelying to sit: Min guard       General bed mobility comments: Instructed in log roll, able to follow instructions and keep spine straight. min guard for  ensuring spinal precautions    Transfers Overall transfer level: Needs assistance Equipment used: Rolling walker (2 wheeled);None Transfers: Sit to/from Stand Sit to Stand: Min guard         General transfer comment: min guard for safety. trialed IV pole vs RW with improved stability noted with RW (as well as posture). No LOB, only intermittent cues for keeping spine straight with mobility     Balance Overall balance assessment: Needs assistance Sitting-balance support: No upper extremity supported;Feet supported Sitting balance-Leahy Scale: Good     Standing balance support: Single extremity supported;During functional activity Standing balance-Leahy Scale: Fair Standing balance comment: able to don pants in standing without UE support, benefits from UE support for dynamic tasks but no major LOB noted                           ADL either performed or assessed with clinical judgement   ADL Overall ADL's : Needs assistance/impaired Eating/Feeding: Set up;Sitting   Grooming: Supervision/safety;Standing   Upper Body Bathing: Supervision/ safety;Sitting   Lower Body Bathing: Sit to/from stand;Supervison/ safety   Upper Body Dressing : Supervision/safety;Sitting   Lower Body Dressing: Minimal assistance;Sit to/from stand Lower Body Dressing Details (indicate cue type and reason): Min A due to difficulty managing buttons on pants (could be just the pant style). Able to use figure four position to don. Educated to don weaker LE first, use pants with drawstring and avoid bending neck to manage buttons/belt Toilet Transfer: Min guard;Ambulation;RW Toilet Transfer Details (indicate cue type and reason): simulated in room Toileting- Clothing Manipulation and Hygiene: Supervision/safety;Sit to/from stand       Functional mobility during ADLs: Min guard;Rolling walker General ADL Comments: Minor difficulties  with ADLs due to cervical precautions. Cervical collar assisted  in reminding pt to avoid excessive neck movement     Vision Baseline Vision/History: Wears glasses Wears Glasses: At all times Patient Visual Report: No change from baseline Vision Assessment?: No apparent visual deficits     Perception     Praxis      Pertinent Vitals/Pain Pain Assessment: 0-10 Pain Score: 4  Pain Location: neck Pain Descriptors / Indicators: Discomfort Pain Intervention(s): Monitored during session;RN gave pain meds during session     Hand Dominance Right   Extremity/Trunk Assessment Upper Extremity Assessment Upper Extremity Assessment: Overall WFL for tasks assessed   Lower Extremity Assessment Lower Extremity Assessment: Defer to PT evaluation;LLE deficits/detail LLE Deficits / Details: foot drop   Cervical / Trunk Assessment Cervical / Trunk Assessment: Normal   Communication Communication Communication: No difficulties   Cognition Arousal/Alertness: Awake/alert Behavior During Therapy: WFL for tasks assessed/performed Overall Cognitive Status: Within Functional Limits for tasks assessed                                 General Comments: Alert, oriented and pleasant. Does need cues for maintaining precautions but able to self monitor and correct as needed   General Comments  Extended time spent educating on cervical precautions with ADLs, positioning, safety in home, IADLs and DME needs    Exercises     Shoulder Instructions      Home Living Family/patient expects to be discharged to:: Private residence Living Arrangements: Alone Available Help at Discharge: Family;Friend(s);Available PRN/intermittently (nephew lives next door and other family members nearby) Type of Home: Apartment Home Access: Level entry     Home Layout: One level     Bathroom Shower/Tub: Teacher, early years/pre: Handicapped height     Home Equipment: Grab bars - tub/shower;Shower seat;Cane - single point;Walker - 2 wheels    Additional Comments: Nephew lives in apartment next door, pt reports he can stay with him or he stays with him as needed.       Prior Functioning/Environment Level of Independence: Independent with assistive device(s)        Comments: Modified Independent with use of cane for mobility, hx of foot drop (has brace that he wears). Able to complete ADLs, IADLs.         OT Problem List: Decreased strength;Impaired balance (sitting and/or standing);Decreased knowledge of precautions      OT Treatment/Interventions: Self-care/ADL training;Therapeutic exercise;DME and/or AE instruction;Therapeutic activities;Patient/family education;Balance training    OT Goals(Current goals can be found in the care plan section) Acute Rehab OT Goals Patient Stated Goal: go home tomorrow OT Goal Formulation: With patient Time For Goal Achievement: 09/07/20 Potential to Achieve Goals: Good ADL Goals Pt Will Perform Grooming: with modified independence;standing Pt Will Perform Lower Body Bathing: with modified independence;sit to/from stand Pt Will Perform Lower Body Dressing: with modified independence;sit to/from stand Pt Will Transfer to Toilet: with modified independence;ambulating Pt Will Perform Tub/Shower Transfer: Tub transfer;with modified independence;ambulating;tub bench;shower seat  OT Frequency: Min 2X/week   Barriers to D/C:            Co-evaluation              AM-PAC OT "6 Clicks" Daily Activity     Outcome Measure Help from another person eating meals?: A Little Help from another person taking care of personal grooming?: A Little Help from another person toileting, which includes  using toliet, bedpan, or urinal?: A Little Help from another person bathing (including washing, rinsing, drying)?: A Little Help from another person to put on and taking off regular upper body clothing?: A Little Help from another person to put on and taking off regular lower body clothing?: A  Little 6 Click Score: 18   End of Session Equipment Utilized During Treatment: Rolling walker;Cervical collar Nurse Communication: Mobility status  Activity Tolerance: Patient tolerated treatment well Patient left: in chair;with call bell/phone within reach;with chair alarm set  OT Visit Diagnosis: Other abnormalities of gait and mobility (R26.89);Muscle weakness (generalized) (M62.81)                Time: 9242-6834 OT Time Calculation (min): 40 min Charges:  OT General Charges $OT Visit: 1 Visit OT Evaluation $OT Eval Low Complexity: 1 Low OT Treatments $Self Care/Home Management : 8-22 mins $Therapeutic Activity: 8-22 mins  Layla Maw, OTR/L  Layla Maw 08/24/2020, 9:26 AM

## 2020-08-25 ENCOUNTER — Encounter (HOSPITAL_COMMUNITY): Payer: Self-pay | Admitting: Neurosurgery

## 2020-08-25 DIAGNOSIS — M4712 Other spondylosis with myelopathy, cervical region: Secondary | ICD-10-CM | POA: Diagnosis not present

## 2020-08-25 DIAGNOSIS — E119 Type 2 diabetes mellitus without complications: Secondary | ICD-10-CM | POA: Diagnosis not present

## 2020-08-25 DIAGNOSIS — Z87891 Personal history of nicotine dependence: Secondary | ICD-10-CM | POA: Diagnosis not present

## 2020-08-25 DIAGNOSIS — Z7982 Long term (current) use of aspirin: Secondary | ICD-10-CM | POA: Diagnosis not present

## 2020-08-25 LAB — GLUCOSE, CAPILLARY
Glucose-Capillary: 121 mg/dL — ABNORMAL HIGH (ref 70–99)
Glucose-Capillary: 155 mg/dL — ABNORMAL HIGH (ref 70–99)
Glucose-Capillary: 257 mg/dL — ABNORMAL HIGH (ref 70–99)

## 2020-08-25 MED ORDER — DEXAMETHASONE 4 MG PO TABS
4.0000 mg | ORAL_TABLET | Freq: Four times a day (QID) | ORAL | Status: AC
  Administered 2020-08-25 (×2): 4 mg via ORAL
  Filled 2020-08-25 (×2): qty 1

## 2020-08-25 NOTE — Progress Notes (Signed)
Occupational Therapy Treatment Patient Details Name: Steve [name] MRN: [id number] DOB: February 07, 1936 Today's Date: 08/25/2020    History of present illness Pt is an 84 year old male who was referred for neurosurgical evaluation by Dr. Merlene [name] for left lower extremity weakness and gait abnormality secondary to cervical spinal stenosis. Pt s/p C3-4 ACDF on 08/23/2020.   OT comments  Pt. Seen for skilled OT treatment session.  Focus of session, education and review of cervical precautions, pt. Requires cues to recall.  Completed lb dressing min guard for socks.  Simulated toileting with in room mobility min guard a.  Reviewed uses of 3n1 and how to adjust height.  Pt. Reports strong family support and assistance available upon d/c home.    Follow Up Recommendations  Home health OT    Equipment Recommendations  Tub/shower bench    Recommendations for Other Services      Precautions / Restrictions Precautions Precautions: Fall;Cervical Required Braces or Orthoses: Cervical Brace Cervical Brace: Soft collar;At all times       Mobility Bed Mobility               General bed mobility comments: seated in chair upon arrival  Transfers Overall transfer level: Needs assistance Equipment used: None Transfers: Sit to/from Stand Sit to Stand: Min guard              Balance                                           ADL either performed or assessed with clinical judgement   ADL Overall ADL's : Needs assistance/impaired                     Lower Body Dressing: Min guard Lower Body Dressing Details (indicate cue type and reason): able to demo figure four position for LB dressing tasks Toilet Transfer: Min Psychiatric nurse Details (indicate cue type and reason): simulated in room           General ADL Comments: reviewed cervical precautions, pt. required cues for remembering all of them.  reviewed uses of 3n1 and how to adj. positioning.  pt. 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  State they help a lot along with nephews g.friend.  Has several sisters in s. [place] too. Having family reunion in [place] this summer.    Pertinent Vitals/ Pain       Pain Assessment: No/denies pain  Home Living  Prior Functioning/Environment              Frequency  Min 2X/week        Progress Toward Goals  OT Goals(current goals can now be found in the care plan section)  Progress towards OT goals: Progressing toward goals     Plan      Co-evaluation                 AM-PAC OT "6 Clicks" Daily Activity     Outcome Measure   Help from another person eating meals?: A Little Help from another person taking care of personal grooming?: A Little Help from another person toileting, which includes using toliet, bedpan, or urinal?: A Little Help from another person bathing (including washing, rinsing, drying)?: A Little Help from another person to put on and taking off regular upper body clothing?: A Little Help from another person to put on and taking off regular lower body clothing?: A Little 6 Click Score: 18    End of Session Equipment Utilized During Treatment: Cervical collar  OT Visit Diagnosis: Other abnormalities of gait and mobility (R26.89);Muscle weakness (generalized) (M62.81)   Activity Tolerance Patient tolerated treatment well   Patient Left in  chair;with call bell/phone within reach;with chair alarm set   Nurse Communication          Time: 7903-8333 OT Time Calculation (min): 13 min  Charges: OT General Charges $OT Visit: 1 Visit OT Treatments $Self Care/Home Management : 8-22 mins  Steve [name], [place]   Steve [name] 08/25/2020, 12:59 PM

## 2020-08-25 NOTE — Progress Notes (Signed)
Physical Therapy Treatment Patient Details Name: Steve [name] MRN: [id number] DOB: 1936-10-09 Today's Date: 08/25/2020    History of Present Illness Pt is an 84 year old male who was referred for neurosurgical evaluation by Dr. Merlene Laughter for left lower extremity weakness and gait abnormality secondary to cervical spinal stenosis. Pt s/p C3-4 ACDF on 08/23/2020.    PT Comments    The pt is demonstrating improvement in mobility and PT goals at this time. He was able to demo good bed mobility and transfers with reduced assistance at this time, as well as progress hallway ambulation with improved foot clearance and posture. The pt was also educated in general safety with mobility and transfers in preparation for d/c home and was given a series of exercises for LE strengthening and ROM. The pt will continue to benefit from skilled PT to further progress functional strength, stability, and independence with mobility acutely and following d/c home.     Follow Up Recommendations  Home health PT     Equipment Recommendations  3in1 (PT)    Recommendations for Other Services       Precautions / Restrictions Precautions Precautions: Fall;Cervical Precaution Booklet Issued: Yes (comment) Required Braces or Orthoses: Cervical Brace Cervical Brace: Soft collar;At all times Restrictions Weight Bearing Restrictions: No    Mobility  Bed Mobility Overal bed mobility: Needs Assistance Bed Mobility: Rolling;Sidelying to Sit Rolling: Supervision Sidelying to sit: Supervision       General bed mobility comments: supervision for log roll technique, pt able to complete without assist  Transfers Overall transfer level: Needs assistance Equipment used: Rolling walker (2 wheeled) Transfers: Sit to/from Stand Sit to Stand: Min guard         General transfer comment: min guard for safety  Ambulation/Gait Ambulation/Gait assistance: Min guard Gait Distance (Feet): 250 Feet Assistive  device: Rolling walker (2 wheeled) Gait Pattern/deviations: Step-through pattern;Decreased dorsiflexion - left;Wide base of support;Trunk flexed Gait velocity: 0.23 m/s Gait velocity interpretation: <1.31 ft/sec, indicative of household ambulator General Gait Details: pt initially with poor clearance and significant trunk flexion, progressed to improved clearance and improved posture with cues.      Balance Overall balance assessment: Needs assistance Sitting-balance support: No upper extremity supported;Feet supported Sitting balance-Leahy Scale: Good     Standing balance support: During functional activity;Bilateral upper extremity supported Standing balance-Leahy Scale: Fair Standing balance comment: able to maintain static stance without assist, but BUE support for gait                            Cognition Arousal/Alertness: Awake/alert Behavior During Therapy: WFL for tasks assessed/performed Overall Cognitive Status: Within Functional Limits for tasks assessed                                 General Comments: Alert, oriented and pleasant. Does need cues for maintaining precautions but able to self monitor and correct as needed      Exercises General Exercises - Lower Extremity Long Arc Quad: AROM;Both;10 reps;Seated Heel Raises: AROM;10 reps;Both;Seated    General Comments General comments (skin integrity, edema, etc.): VSS on RA, discussed safety with mobility and AD at home      Pertinent Vitals/Pain Pain Assessment: No/denies pain           PT Goals (current goals can now be found in the care plan section) Acute Rehab PT Goals Patient Stated Goal: go home  tomorrow PT Goal Formulation: With patient Time For Goal Achievement: 10/12/20 Potential to Achieve Goals: Good Progress towards PT goals: Progressing toward goals    Frequency    Min 5X/week      PT Plan Current plan remains appropriate       AM-PAC PT "6 Clicks"  Mobility   Outcome Measure  Help needed turning from your back to your side while in a flat bed without using bedrails?: None Help needed moving from lying on your back to sitting on the side of a flat bed without using bedrails?: None Help needed moving to and from a bed to a chair (including a wheelchair)?: A Little Help needed standing up from a chair using your arms (e.g., wheelchair or bedside chair)?: A Little Help needed to walk in hospital room?: A Little Help needed climbing 3-5 steps with a railing? : A Little 6 Click Score: 20    End of Session Equipment Utilized During Treatment: Gait belt;Cervical collar Activity Tolerance: Patient tolerated treatment well Patient left: in chair;with call bell/phone within reach;with chair alarm set Nurse Communication: Mobility status PT Visit Diagnosis: Unsteadiness on feet (R26.81);Other abnormalities of gait and mobility (R26.89);Muscle weakness (generalized) (M62.81)     Time: 8828-0034 PT Time Calculation (min) (ACUTE ONLY): 28 min  Charges:  $Gait Training: 23-37 mins                     Karma Ganja, PT, DPT   Acute Rehabilitation Department Pager #: [phone]   Otho Bellows 08/25/2020, 1:21 PM

## 2020-08-25 NOTE — Progress Notes (Signed)
Patient ID: Steve [name], male   DOB: 02/16/1936, 84 y.o.   MRN: [id number] BP (!) 151/64 (BP Location: Right Arm)   Pulse 80   Temp 98.3 F (36.8 C) (Oral)   Resp 16   Ht 5\' 5"  (1.651 m)   Wt 76.5 kg   SpO2 97%   BMI 28.06 kg/m  Alert and oriented x 4, speech is clear and fluent Wound is clean,dry, no signs of infection Complaining of a slight choking sensation Will give a dose of decadron Working well with PT' No discharge today.  Voice is strong, clear

## 2020-08-25 NOTE — Progress Notes (Signed)
Notified RN that pt needs an order for [facility] PT/OT and F2F if pt is D/C today.

## 2020-08-26 DIAGNOSIS — Z87891 Personal history of nicotine dependence: Secondary | ICD-10-CM | POA: Diagnosis not present

## 2020-08-26 DIAGNOSIS — M4712 Other spondylosis with myelopathy, cervical region: Secondary | ICD-10-CM | POA: Diagnosis not present

## 2020-08-26 DIAGNOSIS — Z7982 Long term (current) use of aspirin: Secondary | ICD-10-CM | POA: Diagnosis not present

## 2020-08-26 DIAGNOSIS — E119 Type 2 diabetes mellitus without complications: Secondary | ICD-10-CM | POA: Diagnosis not present

## 2020-08-26 LAB — GLUCOSE, CAPILLARY
Glucose-Capillary: 135 mg/dL — ABNORMAL HIGH (ref 70–99)
Glucose-Capillary: 114 mg/dL — ABNORMAL HIGH (ref 70–99)
Glucose-Capillary: 128 mg/dL — ABNORMAL HIGH (ref 70–99)
Glucose-Capillary: 150 mg/dL — ABNORMAL HIGH (ref 70–99)

## 2020-08-26 NOTE — Progress Notes (Signed)
Patient ID: Steve [name], male   DOB: 02-04-1936, 84 y.o.   MRN: [id number] BP (!) 152/65 (BP Location: Right Arm)   Pulse 77   Temp 98.6 F (37 C) (Oral)   Resp 16   Ht 5\' 5"  (1.651 m)   Wt 76.5 kg   SpO2 100%   BMI 28.06 kg/m  Alert and oriented x 4, speech is clear and fluent Moving all extremities Wound is clean dry, no signs of infection Voice is mildly raspy Discharge tomorrow, no family available today

## 2020-08-27 DIAGNOSIS — M4712 Other spondylosis with myelopathy, cervical region: Secondary | ICD-10-CM | POA: Diagnosis not present

## 2020-08-27 DIAGNOSIS — E119 Type 2 diabetes mellitus without complications: Secondary | ICD-10-CM | POA: Diagnosis not present

## 2020-08-27 DIAGNOSIS — Z7982 Long term (current) use of aspirin: Secondary | ICD-10-CM | POA: Diagnosis not present

## 2020-08-27 DIAGNOSIS — Z87891 Personal history of nicotine dependence: Secondary | ICD-10-CM | POA: Diagnosis not present

## 2020-08-27 LAB — GLUCOSE, CAPILLARY
Glucose-Capillary: 108 mg/dL — ABNORMAL HIGH (ref 70–99)
Glucose-Capillary: 133 mg/dL — ABNORMAL HIGH (ref 70–99)
Glucose-Capillary: 187 mg/dL — ABNORMAL HIGH (ref 70–99)

## 2020-08-27 MED ORDER — METHOCARBAMOL 500 MG PO TABS: 500.0000 mg | ORAL_TABLET | Freq: Four times a day (QID) | ORAL | 0 refills | Status: DC | PRN

## 2020-08-27 MED ORDER — TRAMADOL HCL 50 MG PO TABS: 50.0000 mg | ORAL_TABLET | Freq: Four times a day (QID) | ORAL | 0 refills | Status: AC | PRN

## 2020-08-27 NOTE — Progress Notes (Signed)
Physical Therapy Treatment Patient Details Name: Steve [name] MRN: [id number] DOB: 1936-06-17 Today's Date: 08/27/2020    History of Present Illness Pt is an 84 year old male who was referred for neurosurgical evaluation by Dr. Merlene Laughter for left lower extremity weakness and gait abnormality secondary to cervical spinal stenosis. Pt s/p C3-4 ACDF on 08/23/2020.    PT Comments    Continuing work on functional mobility and activity tolerance;  Mr. Rozo tells me he did not sleep well last night, but that he still would like to get up and walk the hallways; Moving slower than last session, and with a few instances of L foot clearance difficulty, but seemed pleased to be able to walk; ontrack to dc tomorrow   Follow Up Recommendations  Home health PT     Equipment Recommendations  3in1 (PT);Other (comment) (RW if pt does not already have one)    Recommendations for Other Services       Precautions / Restrictions Precautions Precautions: Fall;Cervical Required Braces or Orthoses: Cervical Brace Cervical Brace: Soft collar;At all times    Mobility  Bed Mobility Overal bed mobility: Needs Assistance Bed Mobility: Rolling;Sidelying to Sit Rolling: Supervision Sidelying to sit: Min assist       General bed mobility comments: min handheld assist to pull to sit  Transfers Overall transfer level: Needs assistance Equipment used: Rolling walker (2 wheeled) Transfers: Sit to/from Stand Sit to Stand: Min guard         General transfer comment: min guard for safety  Ambulation/Gait Ambulation/Gait assistance: Min guard Gait Distance (Feet): 180 Feet Assistive device: Rolling walker (2 wheeled) Gait Pattern/deviations: Step-through pattern;Decreased dorsiflexion - left;Wide base of support;Trunk flexed     General Gait Details: pt initially with poor clearance and significant trunk flexion, progressed to improved clearance and improved posture with cues; noted a few  instances with decr L foot clearance; pt states he walked better last session, but that he is tired today   Chief Strategy Officer    Modified Rankin (Stroke Patients Only)       Balance     Sitting balance-Leahy Scale: Good       Standing balance-Leahy Scale: Fair                              Cognition Arousal/Alertness: Awake/alert Behavior During Therapy: WFL for tasks assessed/performed Overall Cognitive Status: Within Functional Limits for tasks assessed                                        Exercises      General Comments        Pertinent Vitals/Pain Pain Assessment: 0-10 Pain Score: 5  Pain Location: neck Pain Descriptors / Indicators: Discomfort Pain Intervention(s): Monitored during session    Home Living                      Prior Function            PT Goals (current goals can now be found in the care plan section) Acute Rehab PT Goals Patient Stated Goal: Hopes to go home soon PT Goal Formulation: With patient Time For Goal Achievement: 09/10/20 Potential to Achieve Goals: Good Progress towards PT goals: Progressing toward goals    Frequency  Min 5X/week      PT Plan Current plan remains appropriate    Co-evaluation              AM-PAC PT "6 Clicks" Mobility   Outcome Measure  Help needed turning from your back to your side while in a flat bed without using bedrails?: None Help needed moving from lying on your back to sitting on the side of a flat bed without using bedrails?: None Help needed moving to and from a bed to a chair (including a wheelchair)?: A Little Help needed standing up from a chair using your arms (e.g., wheelchair or bedside chair)?: A Little Help needed to walk in hospital room?: A Little Help needed climbing 3-5 steps with a railing? : A Little 6 Click Score: 20    End of Session Equipment Utilized During Treatment: Gait belt;Cervical  collar Activity Tolerance: Patient tolerated treatment well Patient left: in chair;with call bell/phone within reach;with chair alarm set Nurse Communication: Mobility status PT Visit Diagnosis: Unsteadiness on feet (R26.81);Other abnormalities of gait and mobility (R26.89);Muscle weakness (generalized) (M62.81)     Time: 3888-2800 PT Time Calculation (min) (ACUTE ONLY): 42 min  Charges:  $Gait Training: 23-37 mins $Therapeutic Activity: 8-22 mins                     Roney Marion, PT  Acute Rehabilitation Services Pager [phone] Office [place] 08/27/2020, 6:14 PM

## 2020-08-27 NOTE — Discharge Instructions (Signed)

## 2020-08-27 NOTE — Discharge Summary (Signed)
Physician Discharge Summary  Patient ID: Steve [name] MRN: [id number] DOB/AGE: 1936-04-12 84 y.o.  Admit date: 08/23/2020 Discharge date: 08/27/2020  Admission Diagnoses:Cervical spondylosis with myelopathy  Discharge Diagnoses:  Active Problems:   Status post cervical discectomy   Cervical myelopathy [facility])   Discharged Condition: fair  Hospital Course: Steve [name] was admitted for cervical myelopathy, neck pain, cervical stenosis at C3/4. Post op he has maintained his neurological baseline with weakness in the upper extremities, hyperreflexia, and gait difficulties. He underwent an uncomplicated ACDF at O8/4. Post op he is working well with pt/ot. He was recommended for home health pt and ot. His wound is clean, dry, and without signs of infection. He is voiding, tolerating a regular diet, and speaking with a normal voice.   Treatments: surgery: Cervical 3-4 Anterior cervical decompression/discectomy/fusion PEEK cage, autograft, anterior cervical [place]  plate ,    Discharge Exam: Blood pressure (!) 104/44, pulse 64, temperature 98.7 F (37.1 C), temperature source Oral, resp. rate 17, height 5' 5"  (1.651 m), weight 76.5 kg, SpO2 100 %. General appearance: alert, cooperative, appears stated age and no distress  Disposition: Discharge disposition: 01-Home or Self Care      Cervical spinal stenosis  Allergies as of 08/27/2020   No Known Allergies     Medication List    TAKE these medications   Accu-Chek Aviva Plus test strip Generic drug: glucose blood   OneTouch Verio test strip Generic drug: glucose blood 1 each 2 (two) times daily.   Accu-Chek Softclix Lancet Dev Kit   Accu-Chek Softclix Lancets lancets   albuterol 108 (90 Base) MCG/ACT inhaler Commonly known as: VENTOLIN HFA Inhale 1-2 puffs into the lungs every 6 (six) hours as needed for wheezing or shortness of breath.   ascorbic acid 500 MG tablet Commonly known as: VITAMIN C Take 500 mg by mouth  daily.   aspirin 81 MG tablet Take 81 mg by mouth daily.   cholecalciferol 25 MCG (1000 UNIT) tablet Commonly known as: VITAMIN D3 Take 1,000 Units by mouth daily.   furosemide 40 MG tablet Commonly known as: LASIX Take 80 mg by mouth 2 (two) times daily.   furosemide 20 MG tablet Commonly known as: LASIX Take 1 tablet (20 mg total) by mouth daily.   gabapentin 300 MG capsule Commonly known as: NEURONTIN Take 300 mg by mouth 2 (two) times daily.   hydrALAZINE 100 MG tablet Commonly known as: APRESOLINE Take 100 mg by mouth 3 (three) times daily.   isosorbide mononitrate 30 MG 24 hr tablet Commonly known as: IMDUR Take 1 tablet (30 mg total) by mouth daily.   lamoTRIgine 25 MG tablet Commonly known as: LAMICTAL Take 25 mg by mouth 2 (two) times daily.   methocarbamol 500 MG tablet Commonly known as: ROBAXIN Take 1 tablet (500 mg total) by mouth every 6 (six) hours as needed for muscle spasms.   metoprolol tartrate 25 MG tablet Commonly known as: LOPRESSOR Take 1 tablet (25 mg total) by mouth 2 (two) times daily.   multivitamin with minerals Tabs tablet Take 1 tablet by mouth daily.   NIFEdipine 90 MG 24 hr tablet Commonly known as: PROCARDIA XL/NIFEDICAL-XL TAKE (1) TABLET BY MOUTH ONCE DAILY. What changed: See the new instructions.   pantoprazole 40 MG tablet Commonly known as: PROTONIX Take 1 tablet (40 mg total) by mouth daily. What changed: when to take this   pravastatin 40 MG tablet Commonly known as: PRAVACHOL Take 40 mg by mouth daily.   predniSONE  5 MG tablet Commonly known as: DELTASONE Take 5 mg by mouth daily with breakfast.   Retacrit 4000 UNIT/ML injection Generic drug: epoetin alfa-epbx 4,000 Units every 14 (fourteen) days.   sodium bicarbonate 650 MG tablet Take 650 mg by mouth 2 (two) times daily.   tamsulosin 0.4 MG Caps capsule Commonly known as: FLOMAX Take 0.4 mg by mouth daily.   Tradjenta 5 MG Tabs tablet Generic drug:  linagliptin Take 5 mg by mouth daily.   traMADol 50 MG tablet Commonly known as: ULTRAM Take 1 tablet (50 mg total) by mouth every 12 (twelve) hours as needed for severe pain. What changed: Another medication with the same name was added. Make sure you understand how and when to take each.   traMADol 50 MG tablet Commonly known as: ULTRAM Take 1 tablet (50 mg total) by mouth every 6 (six) hours as needed for up to 8 days for severe pain (For Mild pain unrelieved by APAP, Severe pain unrelieved fyr Norco and before dilaudid inj). What changed: You were already taking a medication with the same name, and this prescription was added. Make sure you understand how and when to take each.   zolpidem 5 MG tablet Commonly known as: AMBIEN 5 mg at bedtime as needed.            Durable Medical Equipment  (From admission, onward)         Start     Ordered   08/27/20 1843  For home use only DME Tub bench  Once        08/27/20 1842   08/27/20 1843  For home use only DME Walker rolling  Once       Question Answer Comment  Walker: With [place] Wheels   Patient needs a walker to treat with the following condition Cervical spondylosis with myelopathy and radiculopathy      08/27/20 1843   08/24/20 1449  For home use only DME Shower stool  Once        08/24/20 1448   08/24/20 1448  For home use only DME 3 n 1  Once        08/24/20 1448          Follow-up Information    Erline Levine, MD Follow up in 3 week(s).   Specialty: Neurosurgery Why: please call to make an appointment Contact information: [address] [place] [address] [place] [address] [phone]               Signed: Ashok Pall 08/27/2020, 7:02 PM

## 2020-08-28 ENCOUNTER — Other Ambulatory Visit: Payer: Self-pay

## 2020-08-28 ENCOUNTER — Encounter (HOSPITAL_COMMUNITY)
Admission: RE | Admit: 2020-08-28 | Discharge: 2020-08-28 | Disposition: A | Discharge status: Home / Self Care | Payer: Medicare Other | Source: Ambulatory Visit | Attending: Nephrology | Admitting: Nephrology

## 2020-08-28 ENCOUNTER — Encounter (HOSPITAL_COMMUNITY): Payer: Medicare Other

## 2020-08-28 DIAGNOSIS — Z87891 Personal history of nicotine dependence: Secondary | ICD-10-CM | POA: Diagnosis not present

## 2020-08-28 DIAGNOSIS — M4712 Other spondylosis with myelopathy, cervical region: Secondary | ICD-10-CM | POA: Diagnosis not present

## 2020-08-28 DIAGNOSIS — Z7982 Long term (current) use of aspirin: Secondary | ICD-10-CM | POA: Diagnosis not present

## 2020-08-28 DIAGNOSIS — E119 Type 2 diabetes mellitus without complications: Secondary | ICD-10-CM | POA: Diagnosis not present

## 2020-08-28 NOTE — Progress Notes (Signed)
Physical Therapy Treatment Patient Details Name: Steve [name] MRN: [id number] DOB: 07-28-1936 Today's Date: 08/28/2020    History of Present Illness Pt is an 84 year old male who was referred for neurosurgical evaluation by Dr. Merlene Laughter for left lower extremity weakness and gait abnormality secondary to cervical spinal stenosis. Pt s/p C3-4 ACDF on 08/23/2020.    PT Comments    Continuing work on functional mobility and activity tolerance;  Notable improvements to day in gait distance and foot clearance; discussed plans for home, soft collar use, and car transfers; OK for dc home from PT standpoint    Follow Up Recommendations  Home health PT     Equipment Recommendations  3in1 (PT);Other (comment) (RW if pt does not already have one)    Recommendations for Other Services       Precautions / Restrictions Precautions Precautions: Fall;Cervical Precaution Booklet Issued: Yes (comment) Precaution Comments: Fall risk greatly reduced with use of RW Required Braces or Orthoses: Cervical Brace Cervical Brace: Soft collar;Other (comment) (can remove in bed, can ambulate to bathroom without it) Restrictions Weight Bearing Restrictions: No    Mobility  Bed Mobility Overal bed mobility: Needs Assistance Bed Mobility: Sit to Sidelying Rolling: Supervision Sidelying to sit: Supervision     Sit to sidelying: Min guard General bed mobility comments: Supervision for safety, cues for log roll  Transfers Overall transfer level: Needs assistance Equipment used: Rolling walker (2 wheeled) Transfers: Sit to/from Stand Sit to Stand: Supervision         General transfer comment: Supervision, no cues needed for hand placement   Ambulation/Gait Ambulation/Gait assistance: Min guard;Supervision Gait Distance (Feet): 240 Feet Assistive device: Rolling walker (2 wheeled) Gait Pattern/deviations: Step-through pattern;Decreased dorsiflexion - left;Wide base of support;Trunk flexed      General Gait Details: Much improved L foot clearance with swing; Overall walking with less difficulty today   Stairs             Wheelchair Mobility    Modified Rankin (Stroke Patients Only)       Balance Overall balance assessment: Needs assistance Sitting-balance support: No upper extremity supported;Feet supported Sitting balance-Leahy Scale: Good     Standing balance support: During functional activity;Bilateral upper extremity supported Standing balance-Leahy Scale: Fair Standing balance comment: able to maintain static standing with one UE support                            Cognition Arousal/Alertness: Awake/alert Behavior During Therapy: WFL for tasks assessed/performed Overall Cognitive Status: Within Functional Limits for tasks assessed                                 General Comments: Alert, oriented and pleasant. Does need cues for maintaining precautions but able to self monitor and correct as needed      Exercises      General Comments General comments (skin integrity, edema, etc.): Collaborated with OT and pt on using soft collar as reminder for precautions due to habit of wanting to turn head. Pt agreeable      Pertinent Vitals/Pain Pain Assessment: Faces Faces Pain Scale: Hurts a little bit Pain Location: neck Pain Descriptors / Indicators: Discomfort Pain Intervention(s): Monitored during session    Home Living                      Prior Function  PT Goals (current goals can now be found in the care plan section) Acute Rehab PT Goals Patient Stated Goal: go home today PT Goal Formulation: With patient Time For Goal Achievement: 09/10/20 Potential to Achieve Goals: Good Progress towards PT goals: Progressing toward goals    Frequency    Min 5X/week      PT Plan Current plan remains appropriate    Co-evaluation              AM-PAC PT "6 Clicks" Mobility   Outcome  Measure  Help needed turning from your back to your side while in a flat bed without using bedrails?: None Help needed moving from lying on your back to sitting on the side of a flat bed without using bedrails?: None Help needed moving to and from a bed to a chair (including a wheelchair)?: None Help needed standing up from a chair using your arms (e.g., wheelchair or bedside chair)?: None Help needed to walk in hospital room?: A Little Help needed climbing 3-5 steps with a railing? : A Little 6 Click Score: 22    End of Session Equipment Utilized During Treatment: Gait belt;Cervical collar Activity Tolerance: Patient tolerated treatment well Patient left: in bed;with call bell/phone within reach;Other (comment) (Pt hoping to get a nap in before dc) Nurse Communication: Mobility status PT Visit Diagnosis: Unsteadiness on feet (R26.81);Other abnormalities of gait and mobility (R26.89);Muscle weakness (generalized) (M62.81)     Time: 3151-7616 PT Time Calculation (min) (ACUTE ONLY): 14 min  Charges:  $Gait Training: 8-22 mins                     Roney Marion, [place] Pager [phone] Office [phone]    Colletta [place] 08/28/2020, 10:50 AM

## 2020-08-28 NOTE — Progress Notes (Signed)
Discharge instructions given. Patient verbalized understanding and all questions were answered. Awaiting family to take him home.

## 2020-08-28 NOTE — Progress Notes (Signed)
Occupational Therapy Treatment Patient Details Name: Steve [name] MRN: [id number] DOB: 1936/10/02 Today's Date: 08/28/2020    History of present illness Pt is an 84 year old male who was referred for neurosurgical evaluation by Dr. Merlene Laughter for left lower extremity weakness and gait abnormality secondary to cervical spinal stenosis. Pt s/p C3-4 ACDF on 08/23/2020.   OT comments  Pt progressing well towards OT goals, improved clearance of L foot for mobility to/from bathroom with RW today. Pt required no more than Supervision for toileting, dressing and ADLs standing at sink but continues to benefit from cues for cervical precautions. Assessed ability to step over tub with min guard, difficulty lifting L LE high enough to completely clear but pt appears hesitant to use tub transfer bench. Noted updated orders for soft collar - educated pt on these and use of soft collar as reminder to maintain precautions with pt agreeable. Continue to recommend [place] for addressing ADLs and IADLs in the home.    Follow Up Recommendations  Home health OT    Equipment Recommendations  Tub/shower bench    Recommendations for Other Services      Precautions / Restrictions Precautions Precautions: Fall;Cervical Precaution Booklet Issued: Yes (comment) Required Braces or Orthoses: Cervical Brace Cervical Brace: Soft collar;Other (comment) (can remove in bed, can ambulate to bathroom without it) Restrictions Weight Bearing Restrictions: No       Mobility Bed Mobility Overal bed mobility: Needs Assistance Bed Mobility: Rolling;Sidelying to Sit Rolling: Supervision Sidelying to sit: Supervision       General bed mobility comments: Supervision for safety, cues for log roll  Transfers Overall transfer level: Needs assistance Equipment used: Rolling walker (2 wheeled) Transfers: Sit to/from Stand Sit to Stand: Supervision         General transfer comment: Supervision, no cues needed for hand  placement     Balance Overall balance assessment: Needs assistance Sitting-balance support: No upper extremity supported;Feet supported Sitting balance-Leahy Scale: Good     Standing balance support: During functional activity;Bilateral upper extremity supported Standing balance-Leahy Scale: Fair Standing balance comment: able to maintain static standing with one UE support                           ADL either performed or assessed with clinical judgement   ADL Overall ADL's : Needs assistance/impaired     Grooming: Supervision/safety;Standing;Wash/dry hands Grooming Details (indicate cue type and reason): Supervision, minor cues to avoid turning head         Upper Body Dressing : Set up;Standing Upper Body Dressing Details (indicate cue type and reason): Setup to don hospital gown around back in standing, one UE support at a time Lower Body Dressing: Supervision/safety;Cueing for back precautions;Sit to/from stand Lower Body Dressing Details (indicate cue type and reason): Able to bring B feet to self to adjust socks, noted to self monitor and avoid looking down for task Toilet Transfer: Supervision/safety;Ambulation;Regular Toilet;RW Toilet Transfer Details (indicate cue type and reason): no LOB, cues to stay close to walker. Improved ability to lift L foot  Toileting- Clothing Manipulation and Hygiene: Supervision/safety;Sitting/lateral lean;Sit to/from stand Toileting - Clothing Manipulation Details (indicate cue type and reason): No assist needed, supervision for safety Tub/ Shower Transfer: Min guard;Stand-pivot;Rolling walker;Tub transfer Tub/Shower Transfer Details (indicate cue type and reason): min guard using grab bars. Pt with decreased ability to clear L foot (sliding over top of tub). Encouraged tub bench but reports therapist "trying to make me handicapped".  Functional  mobility during ADLs: Supervision/safety;Rolling walker General ADL Comments: Continues  to require cues for cervical precautions but improving. New orders noted for soft collar wearing schedule     Vision   Vision Assessment?: No apparent visual deficits   Perception     Praxis      Cognition Arousal/Alertness: Awake/alert Behavior During Therapy: WFL for tasks assessed/performed Overall Cognitive Status: Within Functional Limits for tasks assessed                                 General Comments: Alert, oriented and pleasant. Does need cues for maintaining precautions but able to self monitor and correct as needed        Exercises     Shoulder Instructions       General Comments Collaborated with PT and pt on using soft collar as reminder for precautions due to habit of wanting to turn head. Pt agreeable    Pertinent Vitals/ Pain       Pain Assessment: Faces Faces Pain Scale: Hurts a little bit Pain Location: neck Pain Descriptors / Indicators: Discomfort Pain Intervention(s): Monitored during session  Home Living                                          Prior Functioning/Environment              Frequency  Min 2X/week        Progress Toward Goals  OT Goals(current goals can now be found in the care plan section)  Progress towards OT goals: Progressing toward goals  Acute Rehab OT Goals Patient Stated Goal: go home today OT Goal Formulation: With patient Time For Goal Achievement: 09/07/20 Potential to Achieve Goals: Good ADL Goals Pt Will Perform Grooming: with modified independence;standing Pt Will Perform Lower Body Bathing: with modified independence;sit to/from stand Pt Will Perform Lower Body Dressing: with modified independence;sit to/from stand Pt Will Transfer to Toilet: with modified independence;ambulating Pt Will Perform Tub/Shower Transfer: Tub transfer;with modified independence;ambulating;tub bench;shower seat  Plan Discharge plan remains appropriate    Co-evaluation                  AM-PAC OT "6 Clicks" Daily Activity     Outcome Measure   Help from another person eating meals?: A Little Help from another person taking care of personal grooming?: A Little Help from another person toileting, which includes using toliet, bedpan, or urinal?: A Little Help from another person bathing (including washing, rinsing, drying)?: A Little Help from another person to put on and taking off regular upper body clothing?: A Little Help from another person to put on and taking off regular lower body clothing?: A Little 6 Click Score: 18    End of Session Equipment Utilized During Treatment: Rolling walker;Cervical collar  OT Visit Diagnosis: Other abnormalities of gait and mobility (R26.89);Muscle weakness (generalized) (M62.81)   Activity Tolerance Patient tolerated treatment well   Patient Left Other (comment) (walking with PT )   Nurse Communication          Time: 9622-2979 OT Time Calculation (min): 17 min  Charges: OT General Charges $OT Visit: 1 Visit OT Treatments $Self Care/Home Management : 8-22 mins  Layla Maw, OTR/L   Layla Maw 08/28/2020, 8:59 AM

## 2020-08-30 DIAGNOSIS — M4722 Other spondylosis with radiculopathy, cervical region: Secondary | ICD-10-CM | POA: Diagnosis not present

## 2020-08-30 DIAGNOSIS — M4312 Spondylolisthesis, cervical region: Secondary | ICD-10-CM | POA: Diagnosis not present

## 2020-08-30 DIAGNOSIS — M4712 Other spondylosis with myelopathy, cervical region: Secondary | ICD-10-CM | POA: Diagnosis not present

## 2020-08-30 DIAGNOSIS — Z4789 Encounter for other orthopedic aftercare: Secondary | ICD-10-CM | POA: Diagnosis not present

## 2020-08-30 DIAGNOSIS — M50322 Other cervical disc degeneration at C5-C6 level: Secondary | ICD-10-CM | POA: Diagnosis not present

## 2020-08-31 ENCOUNTER — Other Ambulatory Visit: Payer: Self-pay

## 2020-08-31 DIAGNOSIS — Z4789 Encounter for other orthopedic aftercare: Secondary | ICD-10-CM | POA: Diagnosis not present

## 2020-08-31 DIAGNOSIS — M4722 Other spondylosis with radiculopathy, cervical region: Secondary | ICD-10-CM | POA: Diagnosis not present

## 2020-08-31 DIAGNOSIS — M4312 Spondylolisthesis, cervical region: Secondary | ICD-10-CM | POA: Diagnosis not present

## 2020-08-31 DIAGNOSIS — M4712 Other spondylosis with myelopathy, cervical region: Secondary | ICD-10-CM | POA: Diagnosis not present

## 2020-08-31 DIAGNOSIS — M50322 Other cervical disc degeneration at C5-C6 level: Secondary | ICD-10-CM | POA: Diagnosis not present

## 2020-08-31 NOTE — Patient Outreach (Signed)
Steve [name] [facility]) Care Management  08/31/2020  Steve [name] 19-May-1936 [id number]   Transition of care:  08/29/2020  Placed call to patient and explained reason for call. Patient has agreed to [facility] services.  Reports he is doing pretty good. States that he is going to his nephews home in [place] to live while he is healing. States he does not know that address.  Reports he has all his medications and is taking them as prescribed. Reports no questions about discharge papers. Reports pain is 6/10.  Taking his pain medications.   Denies any other concerns today.  Plan: will mail patient new patient packet and welcome letter. Will call back in 1 week. Tomasa Rand, RN, BSN, CEN [facility] [employer] [phone]

## 2020-09-03 ENCOUNTER — Ambulatory Visit: Payer: Self-pay

## 2020-09-04 ENCOUNTER — Other Ambulatory Visit: Payer: Self-pay

## 2020-09-04 DIAGNOSIS — Z4789 Encounter for other orthopedic aftercare: Secondary | ICD-10-CM | POA: Diagnosis not present

## 2020-09-04 DIAGNOSIS — M4722 Other spondylosis with radiculopathy, cervical region: Secondary | ICD-10-CM | POA: Diagnosis not present

## 2020-09-04 DIAGNOSIS — M4312 Spondylolisthesis, cervical region: Secondary | ICD-10-CM | POA: Diagnosis not present

## 2020-09-04 DIAGNOSIS — M50322 Other cervical disc degeneration at C5-C6 level: Secondary | ICD-10-CM | POA: Diagnosis not present

## 2020-09-04 DIAGNOSIS — M4712 Other spondylosis with myelopathy, cervical region: Secondary | ICD-10-CM | POA: Diagnosis not present

## 2020-09-04 NOTE — Patient Outreach (Signed)
[facility]) Care Management  09/04/2020  Steve [name] November 10, 1935 [id number]   Transition of care:  Placed call to patient who answered and reports that he is doing well. Reports his family came to stay with him. Reports he has follow up planned on 09/10/2020 with Dr. Legrand Rams. Report pain under control ( 2/10 today).  Reports no problems with swallowing. Reports no new issues. States he is doing very well.  Patient appeared to be in a hurry to get off the phone.  Patient did report that he has been seeing therapist. States both therapist came today.   PLAN: will call back in 1 week.  Tomasa Rand, RN, BSN, CEN [facility] [employer] [phone]

## 2020-09-05 ENCOUNTER — Encounter (HOSPITAL_COMMUNITY): Admission: RE | Admit: 2020-09-05 | Payer: Medicare Other | Source: Ambulatory Visit

## 2020-09-05 ENCOUNTER — Encounter (HOSPITAL_COMMUNITY): Payer: Medicare Other

## 2020-09-07 DIAGNOSIS — M4722 Other spondylosis with radiculopathy, cervical region: Secondary | ICD-10-CM | POA: Diagnosis not present

## 2020-09-07 DIAGNOSIS — M4312 Spondylolisthesis, cervical region: Secondary | ICD-10-CM | POA: Diagnosis not present

## 2020-09-07 DIAGNOSIS — Z4789 Encounter for other orthopedic aftercare: Secondary | ICD-10-CM | POA: Diagnosis not present

## 2020-09-07 DIAGNOSIS — M4712 Other spondylosis with myelopathy, cervical region: Secondary | ICD-10-CM | POA: Diagnosis not present

## 2020-09-07 DIAGNOSIS — M50322 Other cervical disc degeneration at C5-C6 level: Secondary | ICD-10-CM | POA: Diagnosis not present

## 2020-09-10 DIAGNOSIS — I10 Essential (primary) hypertension: Secondary | ICD-10-CM | POA: Diagnosis not present

## 2020-09-10 DIAGNOSIS — N184 Chronic kidney disease, stage 4 (severe): Secondary | ICD-10-CM | POA: Diagnosis not present

## 2020-09-10 DIAGNOSIS — M4802 Spinal stenosis, cervical region: Secondary | ICD-10-CM | POA: Diagnosis not present

## 2020-09-10 DIAGNOSIS — E1142 Type 2 diabetes mellitus with diabetic polyneuropathy: Secondary | ICD-10-CM | POA: Diagnosis not present

## 2020-09-11 ENCOUNTER — Encounter (HOSPITAL_COMMUNITY)
Admission: RE | Admit: 2020-09-11 | Discharge: 2020-09-11 | Disposition: A | Discharge status: Home / Self Care | Payer: Medicare Other | Source: Ambulatory Visit | Attending: Nephrology | Admitting: Nephrology

## 2020-09-11 ENCOUNTER — Other Ambulatory Visit: Payer: Self-pay

## 2020-09-11 ENCOUNTER — Encounter (HOSPITAL_COMMUNITY): Payer: Self-pay

## 2020-09-11 DIAGNOSIS — D631 Anemia in chronic kidney disease: Secondary | ICD-10-CM | POA: Diagnosis not present

## 2020-09-11 DIAGNOSIS — M4722 Other spondylosis with radiculopathy, cervical region: Secondary | ICD-10-CM | POA: Diagnosis not present

## 2020-09-11 DIAGNOSIS — M50322 Other cervical disc degeneration at C5-C6 level: Secondary | ICD-10-CM | POA: Diagnosis not present

## 2020-09-11 DIAGNOSIS — M4312 Spondylolisthesis, cervical region: Secondary | ICD-10-CM | POA: Diagnosis not present

## 2020-09-11 DIAGNOSIS — M4712 Other spondylosis with myelopathy, cervical region: Secondary | ICD-10-CM | POA: Diagnosis not present

## 2020-09-11 DIAGNOSIS — N185 Chronic kidney disease, stage 5: Secondary | ICD-10-CM | POA: Diagnosis not present

## 2020-09-11 DIAGNOSIS — Z4789 Encounter for other orthopedic aftercare: Secondary | ICD-10-CM | POA: Diagnosis not present

## 2020-09-11 LAB — RENAL FUNCTION PANEL
Albumin: 3.6 g/dL (ref 3.5–5.0)
Anion gap: 11 (ref 5–15)
BUN: 53 mg/dL — ABNORMAL HIGH (ref 8–23)
CO2: 21 mmol/L — ABNORMAL LOW (ref 22–32)
Calcium: 9.1 mg/dL (ref 8.9–10.3)
Chloride: 101 mmol/L (ref 98–111)
Creatinine, Ser: 5.4 mg/dL — ABNORMAL HIGH (ref 0.61–1.24)
GFR, Estimated: 10 mL/min — ABNORMAL LOW (ref >60)
Glucose, Bld: 88 mg/dL (ref 70–99)
Phosphorus: 4 mg/dL (ref 2.5–4.6)
Potassium: 5.1 mmol/L (ref 3.5–5.1)
Sodium: 133 mmol/L — ABNORMAL LOW (ref 135–145)

## 2020-09-11 LAB — CBC
HCT: 26.9 % — ABNORMAL LOW (ref 39.0–52.0)
Hemoglobin: 9 g/dL — ABNORMAL LOW (ref 13.0–17.0)
MCH: 26.8 pg (ref 26.0–34.0)
MCHC: 33.5 g/dL (ref 30.0–36.0)
MCV: 80.1 fL (ref 80.0–100.0)
Platelets: 225 10*3/uL (ref 150–400)
RBC: 3.36 MIL/uL — ABNORMAL LOW (ref 4.22–5.81)
RDW: 12.9 % (ref 11.5–15.5)
WBC: 6.6 10*3/uL (ref 4.0–10.5)
nRBC: 0 % (ref 0.0–0.2)

## 2020-09-11 LAB — POCT HEMOGLOBIN-HEMACUE: Hemoglobin: 9 g/dL — ABNORMAL LOW (ref 13.0–17.0)

## 2020-09-11 MED ORDER — EPOETIN ALFA-EPBX 2000 UNIT/ML IJ SOLN
INTRAMUSCULAR | Status: AC
  Filled 2020-09-11: qty 2

## 2020-09-11 MED ORDER — EPOETIN ALFA-EPBX 2000 UNIT/ML IJ SOLN
2000.0000 [IU] | Freq: Once | INTRAMUSCULAR | Status: AC
  Administered 2020-09-11: 2000 [IU] via SUBCUTANEOUS

## 2020-09-11 NOTE — Patient Outreach (Signed)
[facility]) Care Management  09/11/2020  Steve [name] [birth year] [id number]   Transition of care:  Placed call to patient with no answer.   PLAN: will call back in 2 days.  Tomasa Rand, RN, BSN, CEN [facility] [employer] [phone]

## 2020-09-12 DIAGNOSIS — M4802 Spinal stenosis, cervical region: Secondary | ICD-10-CM | POA: Diagnosis not present

## 2020-09-13 ENCOUNTER — Other Ambulatory Visit: Payer: Self-pay

## 2020-09-13 DIAGNOSIS — M4312 Spondylolisthesis, cervical region: Secondary | ICD-10-CM | POA: Diagnosis not present

## 2020-09-13 DIAGNOSIS — M4722 Other spondylosis with radiculopathy, cervical region: Secondary | ICD-10-CM | POA: Diagnosis not present

## 2020-09-13 DIAGNOSIS — M4712 Other spondylosis with myelopathy, cervical region: Secondary | ICD-10-CM | POA: Diagnosis not present

## 2020-09-13 DIAGNOSIS — M50322 Other cervical disc degeneration at C5-C6 level: Secondary | ICD-10-CM | POA: Diagnosis not present

## 2020-09-13 DIAGNOSIS — Z4789 Encounter for other orthopedic aftercare: Secondary | ICD-10-CM | POA: Diagnosis not present

## 2020-09-14 DIAGNOSIS — E872 Acidosis: Secondary | ICD-10-CM | POA: Diagnosis not present

## 2020-09-14 DIAGNOSIS — N185 Chronic kidney disease, stage 5: Secondary | ICD-10-CM | POA: Diagnosis not present

## 2020-09-14 DIAGNOSIS — E871 Hypo-osmolality and hyponatremia: Secondary | ICD-10-CM | POA: Diagnosis not present

## 2020-09-14 DIAGNOSIS — I10 Essential (primary) hypertension: Secondary | ICD-10-CM | POA: Diagnosis not present

## 2020-09-14 DIAGNOSIS — R809 Proteinuria, unspecified: Secondary | ICD-10-CM | POA: Diagnosis not present

## 2020-09-14 NOTE — Patient Outreach (Signed)
[facility]) Care Management  09/14/2020  Steve [name] [birth year] [id number]   Transition of care:  Placed call to patient who did answered and did not remember who I was.  Reviewed with patient that we have been talking for weeks since his surgery. Patient reports to me that he is feeling good. Reports no pain.  Reports he has followed up with primary care doctor this week.  Reports taking all medications as prescribed. Reports he is feeling stronger.  Denies any new problems or concerns today and states he does not need to me to call him anymore. Reviewed case closure with patient and he is in agreement.   PLAN: will close case as goals met early.  Will send case closure to patient and MD.  Tomasa Rand, RN, BSN, CEN [place] Coordinator [phone]

## 2020-09-18 DIAGNOSIS — J209 Acute bronchitis, unspecified: Secondary | ICD-10-CM | POA: Diagnosis not present

## 2020-09-18 DIAGNOSIS — Z4789 Encounter for other orthopedic aftercare: Secondary | ICD-10-CM | POA: Diagnosis not present

## 2020-09-18 DIAGNOSIS — M50322 Other cervical disc degeneration at C5-C6 level: Secondary | ICD-10-CM | POA: Diagnosis not present

## 2020-09-18 DIAGNOSIS — M4722 Other spondylosis with radiculopathy, cervical region: Secondary | ICD-10-CM | POA: Diagnosis not present

## 2020-09-18 DIAGNOSIS — M4712 Other spondylosis with myelopathy, cervical region: Secondary | ICD-10-CM | POA: Diagnosis not present

## 2020-09-18 DIAGNOSIS — K219 Gastro-esophageal reflux disease without esophagitis: Secondary | ICD-10-CM | POA: Diagnosis not present

## 2020-09-18 DIAGNOSIS — M4312 Spondylolisthesis, cervical region: Secondary | ICD-10-CM | POA: Diagnosis not present

## 2020-09-19 DIAGNOSIS — Z4789 Encounter for other orthopedic aftercare: Secondary | ICD-10-CM | POA: Diagnosis not present

## 2020-09-19 DIAGNOSIS — M4722 Other spondylosis with radiculopathy, cervical region: Secondary | ICD-10-CM | POA: Diagnosis not present

## 2020-09-19 DIAGNOSIS — M4712 Other spondylosis with myelopathy, cervical region: Secondary | ICD-10-CM | POA: Diagnosis not present

## 2020-09-19 DIAGNOSIS — M4312 Spondylolisthesis, cervical region: Secondary | ICD-10-CM | POA: Diagnosis not present

## 2020-09-19 DIAGNOSIS — M50322 Other cervical disc degeneration at C5-C6 level: Secondary | ICD-10-CM | POA: Diagnosis not present

## 2020-09-25 ENCOUNTER — Other Ambulatory Visit: Payer: Self-pay

## 2020-09-25 ENCOUNTER — Encounter (HOSPITAL_COMMUNITY): Payer: Self-pay

## 2020-09-25 ENCOUNTER — Encounter (HOSPITAL_COMMUNITY)
Admission: RE | Admit: 2020-09-25 | Discharge: 2020-09-25 | Disposition: A | Discharge status: Home / Self Care | Payer: Medicare Other | Source: Ambulatory Visit | Attending: Nephrology | Admitting: Nephrology

## 2020-09-25 DIAGNOSIS — N185 Chronic kidney disease, stage 5: Secondary | ICD-10-CM | POA: Diagnosis not present

## 2020-09-25 DIAGNOSIS — M50322 Other cervical disc degeneration at C5-C6 level: Secondary | ICD-10-CM | POA: Diagnosis not present

## 2020-09-25 DIAGNOSIS — D631 Anemia in chronic kidney disease: Secondary | ICD-10-CM | POA: Insufficient documentation

## 2020-09-25 DIAGNOSIS — M4312 Spondylolisthesis, cervical region: Secondary | ICD-10-CM | POA: Diagnosis not present

## 2020-09-25 DIAGNOSIS — Z4789 Encounter for other orthopedic aftercare: Secondary | ICD-10-CM | POA: Diagnosis not present

## 2020-09-25 DIAGNOSIS — M4712 Other spondylosis with myelopathy, cervical region: Secondary | ICD-10-CM | POA: Diagnosis not present

## 2020-09-25 DIAGNOSIS — M4722 Other spondylosis with radiculopathy, cervical region: Secondary | ICD-10-CM | POA: Diagnosis not present

## 2020-09-25 LAB — POCT HEMOGLOBIN-HEMACUE: Hemoglobin: 10.4 g/dL — ABNORMAL LOW (ref 13.0–17.0)

## 2020-09-25 MED ORDER — EPOETIN ALFA-EPBX 2000 UNIT/ML IJ SOLN: 2000.0000 [IU] | Freq: Once | INTRAMUSCULAR | Status: DC

## 2020-09-26 DIAGNOSIS — M4312 Spondylolisthesis, cervical region: Secondary | ICD-10-CM | POA: Diagnosis not present

## 2020-09-26 DIAGNOSIS — G8314 Monoplegia of lower limb affecting left nondominant side: Secondary | ICD-10-CM | POA: Diagnosis not present

## 2020-09-26 DIAGNOSIS — E1142 Type 2 diabetes mellitus with diabetic polyneuropathy: Secondary | ICD-10-CM | POA: Diagnosis not present

## 2020-09-26 DIAGNOSIS — Z4789 Encounter for other orthopedic aftercare: Secondary | ICD-10-CM | POA: Diagnosis not present

## 2020-09-26 DIAGNOSIS — M4722 Other spondylosis with radiculopathy, cervical region: Secondary | ICD-10-CM | POA: Diagnosis not present

## 2020-09-26 DIAGNOSIS — M50322 Other cervical disc degeneration at C5-C6 level: Secondary | ICD-10-CM | POA: Diagnosis not present

## 2020-09-26 DIAGNOSIS — M4712 Other spondylosis with myelopathy, cervical region: Secondary | ICD-10-CM | POA: Diagnosis not present

## 2020-09-26 DIAGNOSIS — R269 Unspecified abnormalities of gait and mobility: Secondary | ICD-10-CM | POA: Diagnosis not present

## 2020-10-09 ENCOUNTER — Encounter (HOSPITAL_COMMUNITY)
Admission: RE | Admit: 2020-10-09 | Discharge: 2020-10-09 | Disposition: A | Discharge status: Home / Self Care | Payer: Medicare Other | Source: Ambulatory Visit | Attending: Nephrology | Admitting: Nephrology

## 2020-10-09 ENCOUNTER — Other Ambulatory Visit: Payer: Self-pay

## 2020-10-09 ENCOUNTER — Encounter (HOSPITAL_COMMUNITY): Payer: Self-pay

## 2020-10-09 DIAGNOSIS — D631 Anemia in chronic kidney disease: Secondary | ICD-10-CM | POA: Diagnosis not present

## 2020-10-09 DIAGNOSIS — N185 Chronic kidney disease, stage 5: Secondary | ICD-10-CM | POA: Diagnosis not present

## 2020-10-09 LAB — POCT HEMOGLOBIN-HEMACUE: Hemoglobin: 7.9 g/dL — ABNORMAL LOW (ref 13.0–17.0)

## 2020-10-09 MED ORDER — EPOETIN ALFA-EPBX 2000 UNIT/ML IJ SOLN
2000.0000 [IU] | Freq: Once | INTRAMUSCULAR | Status: AC
  Administered 2020-10-09: 15:00:00 2000 [IU] via SUBCUTANEOUS
  Filled 2020-10-09: qty 1

## 2020-10-19 DIAGNOSIS — I10 Essential (primary) hypertension: Secondary | ICD-10-CM | POA: Diagnosis not present

## 2020-10-19 DIAGNOSIS — E1142 Type 2 diabetes mellitus with diabetic polyneuropathy: Secondary | ICD-10-CM | POA: Diagnosis not present

## 2020-10-23 ENCOUNTER — Other Ambulatory Visit: Payer: Self-pay

## 2020-10-23 ENCOUNTER — Encounter (HOSPITAL_COMMUNITY)
Admission: RE | Admit: 2020-10-23 | Discharge: 2020-10-23 | Disposition: A | Discharge status: Home / Self Care | Payer: Medicare Other | Source: Ambulatory Visit | Attending: Nephrology | Admitting: Nephrology

## 2020-10-23 ENCOUNTER — Encounter (HOSPITAL_COMMUNITY): Payer: Self-pay

## 2020-10-23 DIAGNOSIS — N185 Chronic kidney disease, stage 5: Secondary | ICD-10-CM | POA: Insufficient documentation

## 2020-10-23 DIAGNOSIS — D631 Anemia in chronic kidney disease: Secondary | ICD-10-CM | POA: Diagnosis not present

## 2020-10-23 LAB — CBC
HCT: 29 % — ABNORMAL LOW (ref 39.0–52.0)
Hemoglobin: 9.5 g/dL — ABNORMAL LOW (ref 13.0–17.0)
MCH: 26.5 pg (ref 26.0–34.0)
MCHC: 32.8 g/dL (ref 30.0–36.0)
MCV: 80.8 fL (ref 80.0–100.0)
Platelets: 239 10*3/uL (ref 150–400)
RBC: 3.59 MIL/uL — ABNORMAL LOW (ref 4.22–5.81)
RDW: 13.5 % (ref 11.5–15.5)
WBC: 4.9 10*3/uL (ref 4.0–10.5)
nRBC: 0 % (ref 0.0–0.2)

## 2020-10-23 LAB — RENAL FUNCTION PANEL
Albumin: 3.7 g/dL (ref 3.5–5.0)
Anion gap: 11 (ref 5–15)
BUN: 58 mg/dL — ABNORMAL HIGH (ref 8–23)
CO2: 20 mmol/L — ABNORMAL LOW (ref 22–32)
Calcium: 9.1 mg/dL (ref 8.9–10.3)
Chloride: 105 mmol/L (ref 98–111)
Creatinine, Ser: 5.19 mg/dL — ABNORMAL HIGH (ref 0.61–1.24)
GFR, Estimated: 10 mL/min — ABNORMAL LOW (ref >60)
Glucose, Bld: 139 mg/dL — ABNORMAL HIGH (ref 70–99)
Phosphorus: 3.9 mg/dL (ref 2.5–4.6)
Potassium: 4.4 mmol/L (ref 3.5–5.1)
Sodium: 136 mmol/L (ref 135–145)

## 2020-10-23 LAB — POCT HEMOGLOBIN-HEMACUE: Hemoglobin: 10.3 g/dL — ABNORMAL LOW (ref 13.0–17.0)

## 2020-10-23 MED ORDER — EPOETIN ALFA-EPBX 2000 UNIT/ML IJ SOLN: 2000.0000 [IU] | Freq: Once | INTRAMUSCULAR | Status: DC

## 2020-10-24 ENCOUNTER — Other Ambulatory Visit: Payer: Self-pay | Admitting: Urology

## 2020-10-24 DIAGNOSIS — Z8546 Personal history of malignant neoplasm of prostate: Secondary | ICD-10-CM

## 2020-10-25 DIAGNOSIS — E1129 Type 2 diabetes mellitus with other diabetic kidney complication: Secondary | ICD-10-CM | POA: Diagnosis not present

## 2020-10-25 DIAGNOSIS — E1122 Type 2 diabetes mellitus with diabetic chronic kidney disease: Secondary | ICD-10-CM | POA: Diagnosis not present

## 2020-10-25 DIAGNOSIS — D631 Anemia in chronic kidney disease: Secondary | ICD-10-CM | POA: Diagnosis not present

## 2020-10-25 DIAGNOSIS — N189 Chronic kidney disease, unspecified: Secondary | ICD-10-CM | POA: Diagnosis not present

## 2020-10-25 DIAGNOSIS — N185 Chronic kidney disease, stage 5: Secondary | ICD-10-CM | POA: Diagnosis not present

## 2020-10-25 DIAGNOSIS — I10 Essential (primary) hypertension: Secondary | ICD-10-CM | POA: Diagnosis not present

## 2020-10-25 DIAGNOSIS — E872 Acidosis: Secondary | ICD-10-CM | POA: Diagnosis not present

## 2020-10-25 DIAGNOSIS — R809 Proteinuria, unspecified: Secondary | ICD-10-CM | POA: Diagnosis not present

## 2020-11-06 ENCOUNTER — Encounter (HOSPITAL_COMMUNITY)
Admission: RE | Admit: 2020-11-06 | Discharge: 2020-11-06 | Disposition: A | Discharge status: Home / Self Care | Payer: Medicare Other | Source: Ambulatory Visit | Attending: Nephrology | Admitting: Nephrology

## 2020-11-06 ENCOUNTER — Encounter (HOSPITAL_COMMUNITY): Payer: Self-pay

## 2020-11-06 ENCOUNTER — Other Ambulatory Visit: Payer: Self-pay

## 2020-11-06 DIAGNOSIS — D631 Anemia in chronic kidney disease: Secondary | ICD-10-CM | POA: Diagnosis not present

## 2020-11-06 DIAGNOSIS — N185 Chronic kidney disease, stage 5: Secondary | ICD-10-CM | POA: Diagnosis not present

## 2020-11-06 LAB — POCT HEMOGLOBIN-HEMACUE: Hemoglobin: 9.2 g/dL — ABNORMAL LOW (ref 13.0–17.0)

## 2020-11-06 MED ORDER — EPOETIN ALFA-EPBX 2000 UNIT/ML IJ SOLN
INTRAMUSCULAR | Status: AC
  Filled 2020-11-06: qty 2

## 2020-11-06 MED ORDER — EPOETIN ALFA-EPBX 2000 UNIT/ML IJ SOLN
2000.0000 [IU] | Freq: Once | INTRAMUSCULAR | Status: AC
  Administered 2020-11-06: 2000 [IU] via SUBCUTANEOUS

## 2020-11-14 DIAGNOSIS — M4802 Spinal stenosis, cervical region: Secondary | ICD-10-CM | POA: Diagnosis not present

## 2020-11-20 ENCOUNTER — Other Ambulatory Visit (HOSPITAL_COMMUNITY): Payer: Medicare Other

## 2020-11-20 ENCOUNTER — Encounter (HOSPITAL_COMMUNITY): Admission: RE | Admit: 2020-11-20 | Payer: Medicare Other | Source: Ambulatory Visit

## 2020-11-20 ENCOUNTER — Encounter (HOSPITAL_COMMUNITY)
Admission: RE | Admit: 2020-11-20 | Discharge: 2020-11-20 | Disposition: A | Discharge status: Home / Self Care | Payer: Medicare Other | Source: Ambulatory Visit | Attending: Nephrology | Admitting: Nephrology

## 2020-11-26 DIAGNOSIS — Z1389 Encounter for screening for other disorder: Secondary | ICD-10-CM | POA: Diagnosis not present

## 2020-11-26 DIAGNOSIS — I10 Essential (primary) hypertension: Secondary | ICD-10-CM | POA: Diagnosis not present

## 2020-11-26 DIAGNOSIS — N184 Chronic kidney disease, stage 4 (severe): Secondary | ICD-10-CM | POA: Diagnosis not present

## 2020-11-26 DIAGNOSIS — Z0001 Encounter for general adult medical examination with abnormal findings: Secondary | ICD-10-CM | POA: Diagnosis not present

## 2020-11-26 DIAGNOSIS — E1142 Type 2 diabetes mellitus with diabetic polyneuropathy: Secondary | ICD-10-CM | POA: Diagnosis not present

## 2020-11-28 DIAGNOSIS — Z0001 Encounter for general adult medical examination with abnormal findings: Secondary | ICD-10-CM | POA: Diagnosis not present

## 2020-11-28 DIAGNOSIS — E1142 Type 2 diabetes mellitus with diabetic polyneuropathy: Secondary | ICD-10-CM | POA: Diagnosis not present

## 2020-11-28 DIAGNOSIS — I10 Essential (primary) hypertension: Secondary | ICD-10-CM | POA: Diagnosis not present

## 2020-12-04 ENCOUNTER — Other Ambulatory Visit: Payer: Self-pay

## 2020-12-04 ENCOUNTER — Encounter (HOSPITAL_COMMUNITY): Payer: Self-pay

## 2020-12-04 ENCOUNTER — Encounter (HOSPITAL_COMMUNITY)
Admission: RE | Admit: 2020-12-04 | Discharge: 2020-12-04 | Disposition: A | Discharge status: Home / Self Care | Payer: Medicare Other | Source: Ambulatory Visit | Attending: Nephrology | Admitting: Nephrology

## 2020-12-04 DIAGNOSIS — R809 Proteinuria, unspecified: Secondary | ICD-10-CM | POA: Insufficient documentation

## 2020-12-04 DIAGNOSIS — I129 Hypertensive chronic kidney disease with stage 1 through stage 4 chronic kidney disease, or unspecified chronic kidney disease: Secondary | ICD-10-CM | POA: Diagnosis not present

## 2020-12-04 DIAGNOSIS — E872 Acidosis: Secondary | ICD-10-CM | POA: Diagnosis not present

## 2020-12-04 DIAGNOSIS — E1129 Type 2 diabetes mellitus with other diabetic kidney complication: Secondary | ICD-10-CM | POA: Insufficient documentation

## 2020-12-04 DIAGNOSIS — D631 Anemia in chronic kidney disease: Secondary | ICD-10-CM | POA: Diagnosis not present

## 2020-12-04 DIAGNOSIS — E1122 Type 2 diabetes mellitus with diabetic chronic kidney disease: Secondary | ICD-10-CM | POA: Diagnosis not present

## 2020-12-04 DIAGNOSIS — N185 Chronic kidney disease, stage 5: Secondary | ICD-10-CM | POA: Diagnosis not present

## 2020-12-04 LAB — CBC
HCT: 30.5 % — ABNORMAL LOW (ref 39.0–52.0)
Hemoglobin: 10.1 g/dL — ABNORMAL LOW (ref 13.0–17.0)
MCH: 26.9 pg (ref 26.0–34.0)
MCHC: 33.1 g/dL (ref 30.0–36.0)
MCV: 81.3 fL (ref 80.0–100.0)
Platelets: 237 10*3/uL (ref 150–400)
RBC: 3.75 MIL/uL — ABNORMAL LOW (ref 4.22–5.81)
RDW: 13.1 % (ref 11.5–15.5)
WBC: 5.6 10*3/uL (ref 4.0–10.5)
nRBC: 0 % (ref 0.0–0.2)

## 2020-12-04 LAB — RENAL FUNCTION PANEL
Albumin: 3.8 g/dL (ref 3.5–5.0)
Anion gap: 10 (ref 5–15)
BUN: 78 mg/dL — ABNORMAL HIGH (ref 8–23)
CO2: 24 mmol/L (ref 22–32)
Calcium: 9.6 mg/dL (ref 8.9–10.3)
Chloride: 104 mmol/L (ref 98–111)
Creatinine, Ser: 6.65 mg/dL — ABNORMAL HIGH (ref 0.61–1.24)
GFR, Estimated: 8 mL/min — ABNORMAL LOW (ref >60)
Glucose, Bld: 106 mg/dL — ABNORMAL HIGH (ref 70–99)
Phosphorus: 3.7 mg/dL (ref 2.5–4.6)
Potassium: 5.7 mmol/L — ABNORMAL HIGH (ref 3.5–5.1)
Sodium: 138 mmol/L (ref 135–145)

## 2020-12-04 LAB — POCT HEMOGLOBIN-HEMACUE: Hemoglobin: 10.2 g/dL — ABNORMAL LOW (ref 13.0–17.0)

## 2020-12-04 MED ORDER — EPOETIN ALFA-EPBX 10000 UNIT/ML IJ SOLN: 4000.0000 [IU] | Freq: Once | INTRAMUSCULAR | Status: DC

## 2020-12-05 LAB — PARATHYROID HORMONE, INTACT (NO CA): PTH: 50 pg/mL (ref 15–65)

## 2020-12-07 DIAGNOSIS — D631 Anemia in chronic kidney disease: Secondary | ICD-10-CM | POA: Diagnosis not present

## 2020-12-07 DIAGNOSIS — R809 Proteinuria, unspecified: Secondary | ICD-10-CM | POA: Diagnosis not present

## 2020-12-07 DIAGNOSIS — E211 Secondary hyperparathyroidism, not elsewhere classified: Secondary | ICD-10-CM | POA: Diagnosis not present

## 2020-12-07 DIAGNOSIS — E872 Acidosis: Secondary | ICD-10-CM | POA: Diagnosis not present

## 2020-12-07 DIAGNOSIS — N185 Chronic kidney disease, stage 5: Secondary | ICD-10-CM | POA: Diagnosis not present

## 2020-12-07 DIAGNOSIS — N189 Chronic kidney disease, unspecified: Secondary | ICD-10-CM | POA: Diagnosis not present

## 2020-12-07 DIAGNOSIS — E1129 Type 2 diabetes mellitus with other diabetic kidney complication: Secondary | ICD-10-CM | POA: Diagnosis not present

## 2020-12-07 DIAGNOSIS — E875 Hyperkalemia: Secondary | ICD-10-CM | POA: Diagnosis not present

## 2020-12-07 DIAGNOSIS — I10 Essential (primary) hypertension: Secondary | ICD-10-CM | POA: Diagnosis not present

## 2020-12-07 DIAGNOSIS — E1122 Type 2 diabetes mellitus with diabetic chronic kidney disease: Secondary | ICD-10-CM | POA: Diagnosis not present

## 2020-12-11 ENCOUNTER — Other Ambulatory Visit: Payer: Self-pay

## 2020-12-11 ENCOUNTER — Other Ambulatory Visit (HOSPITAL_COMMUNITY)
Admission: RE | Admit: 2020-12-11 | Discharge: 2020-12-11 | Disposition: A | Discharge status: Home / Self Care | Payer: Medicare Other | Source: Ambulatory Visit | Attending: Nephrology | Admitting: Nephrology

## 2020-12-11 DIAGNOSIS — N185 Chronic kidney disease, stage 5: Secondary | ICD-10-CM | POA: Diagnosis not present

## 2020-12-11 DIAGNOSIS — E1129 Type 2 diabetes mellitus with other diabetic kidney complication: Secondary | ICD-10-CM | POA: Diagnosis not present

## 2020-12-11 DIAGNOSIS — E1122 Type 2 diabetes mellitus with diabetic chronic kidney disease: Secondary | ICD-10-CM | POA: Diagnosis not present

## 2020-12-11 DIAGNOSIS — I10 Essential (primary) hypertension: Secondary | ICD-10-CM | POA: Diagnosis not present

## 2020-12-11 DIAGNOSIS — E872 Acidosis: Secondary | ICD-10-CM | POA: Insufficient documentation

## 2020-12-11 DIAGNOSIS — E875 Hyperkalemia: Secondary | ICD-10-CM | POA: Insufficient documentation

## 2020-12-11 DIAGNOSIS — R809 Proteinuria, unspecified: Secondary | ICD-10-CM | POA: Insufficient documentation

## 2020-12-11 DIAGNOSIS — D631 Anemia in chronic kidney disease: Secondary | ICD-10-CM | POA: Insufficient documentation

## 2020-12-11 DIAGNOSIS — E211 Secondary hyperparathyroidism, not elsewhere classified: Secondary | ICD-10-CM | POA: Insufficient documentation

## 2020-12-11 DIAGNOSIS — N189 Chronic kidney disease, unspecified: Secondary | ICD-10-CM | POA: Insufficient documentation

## 2020-12-11 LAB — CBC
HCT: 29.6 % — ABNORMAL LOW (ref 39.0–52.0)
Hemoglobin: 9.8 g/dL — ABNORMAL LOW (ref 13.0–17.0)
MCH: 26.6 pg (ref 26.0–34.0)
MCHC: 33.1 g/dL (ref 30.0–36.0)
MCV: 80.4 fL (ref 80.0–100.0)
Platelets: 241 10*3/uL (ref 150–400)
RBC: 3.68 MIL/uL — ABNORMAL LOW (ref 4.22–5.81)
RDW: 12.9 % (ref 11.5–15.5)
WBC: 5.8 10*3/uL (ref 4.0–10.5)
nRBC: 0 % (ref 0.0–0.2)

## 2020-12-11 LAB — RENAL FUNCTION PANEL
Albumin: 3.6 g/dL (ref 3.5–5.0)
Anion gap: 12 (ref 5–15)
BUN: 70 mg/dL — ABNORMAL HIGH (ref 8–23)
CO2: 20 mmol/L — ABNORMAL LOW (ref 22–32)
Calcium: 9.1 mg/dL (ref 8.9–10.3)
Chloride: 106 mmol/L (ref 98–111)
Creatinine, Ser: 6.32 mg/dL — ABNORMAL HIGH (ref 0.61–1.24)
GFR, Estimated: 8 mL/min — ABNORMAL LOW (ref >60)
Glucose, Bld: 134 mg/dL — ABNORMAL HIGH (ref 70–99)
Phosphorus: 4.2 mg/dL (ref 2.5–4.6)
Potassium: 4.8 mmol/L (ref 3.5–5.1)
Sodium: 138 mmol/L (ref 135–145)

## 2020-12-11 LAB — IRON AND TIBC
Iron: 84 ug/dL (ref 45–182)
Saturation Ratios: 24 % (ref 17.9–39.5)
TIBC: 344 ug/dL (ref 250–450)
UIBC: 260 ug/dL

## 2020-12-11 LAB — HEPATITIS C ANTIBODY: HCV Ab: NONREACTIVE

## 2020-12-11 LAB — PROTEIN / CREATININE RATIO, URINE
Creatinine, Urine: 102.83 mg/dL
Protein Creatinine Ratio: 1.03 mg/mg{Cre} — ABNORMAL HIGH (ref 0.00–0.15)
Total Protein, Urine: 106 mg/dL

## 2020-12-11 LAB — HEPATITIS B SURFACE ANTIGEN: Hepatitis B Surface Ag: NONREACTIVE

## 2020-12-11 LAB — VITAMIN D 25 HYDROXY (VIT D DEFICIENCY, FRACTURES): Vit D, 25-Hydroxy: 47.05 ng/mL (ref 30–100)

## 2020-12-11 LAB — HEPATITIS B CORE ANTIBODY, IGM: Hep B C IgM: NONREACTIVE

## 2020-12-12 LAB — HCV RNA QUANT RFLX ULTRA OR GENOTYP
HCV RNA Qnt(log copy/mL): UNDETERMINED log10 IU/mL
HepC Qn: NOT DETECTED IU/mL

## 2020-12-12 LAB — HEPATITIS B SURFACE ANTIBODY, QUANTITATIVE: Hep B S AB Quant (Post): <3.1 m[IU]/mL — ABNORMAL LOW (ref >9.9)

## 2020-12-12 LAB — PTH, INTACT AND CALCIUM
Calcium, Total (PTH): 9.2 mg/dL (ref 8.6–10.2)
PTH: 82 pg/mL — ABNORMAL HIGH (ref 15–65)

## 2020-12-14 DIAGNOSIS — E211 Secondary hyperparathyroidism, not elsewhere classified: Secondary | ICD-10-CM | POA: Diagnosis not present

## 2020-12-14 DIAGNOSIS — N189 Chronic kidney disease, unspecified: Secondary | ICD-10-CM | POA: Diagnosis not present

## 2020-12-14 DIAGNOSIS — I10 Essential (primary) hypertension: Secondary | ICD-10-CM | POA: Diagnosis not present

## 2020-12-14 DIAGNOSIS — R809 Proteinuria, unspecified: Secondary | ICD-10-CM | POA: Diagnosis not present

## 2020-12-14 DIAGNOSIS — D631 Anemia in chronic kidney disease: Secondary | ICD-10-CM | POA: Diagnosis not present

## 2020-12-14 DIAGNOSIS — N185 Chronic kidney disease, stage 5: Secondary | ICD-10-CM | POA: Diagnosis not present

## 2020-12-14 DIAGNOSIS — E872 Acidosis: Secondary | ICD-10-CM | POA: Diagnosis not present

## 2020-12-14 DIAGNOSIS — E1122 Type 2 diabetes mellitus with diabetic chronic kidney disease: Secondary | ICD-10-CM | POA: Diagnosis not present

## 2020-12-14 DIAGNOSIS — E1129 Type 2 diabetes mellitus with other diabetic kidney complication: Secondary | ICD-10-CM | POA: Diagnosis not present

## 2020-12-15 LAB — QUANTIFERON-TB GOLD PLUS (RQFGPL)
QuantiFERON Mitogen Value: 2.21 IU/mL
QuantiFERON Nil Value: 0.08 IU/mL
QuantiFERON TB1 Ag Value: 0.11 IU/mL
QuantiFERON TB2 Ag Value: 0.09 IU/mL

## 2020-12-15 LAB — QUANTIFERON-TB GOLD PLUS: QuantiFERON-TB Gold Plus: NEGATIVE

## 2020-12-18 ENCOUNTER — Telehealth (HOSPITAL_COMMUNITY): Payer: Self-pay

## 2020-12-18 ENCOUNTER — Encounter (HOSPITAL_COMMUNITY)
Admission: RE | Admit: 2020-12-18 | Discharge: 2020-12-18 | Disposition: A | Discharge status: Home / Self Care | Payer: Medicare Other | Source: Ambulatory Visit | Attending: Nephrology | Admitting: Nephrology

## 2020-12-18 ENCOUNTER — Other Ambulatory Visit (HOSPITAL_COMMUNITY): Payer: Self-pay | Admitting: Nephrology

## 2020-12-18 DIAGNOSIS — N185 Chronic kidney disease, stage 5: Secondary | ICD-10-CM

## 2020-12-18 DIAGNOSIS — E1122 Type 2 diabetes mellitus with diabetic chronic kidney disease: Secondary | ICD-10-CM

## 2020-12-18 DIAGNOSIS — D631 Anemia in chronic kidney disease: Secondary | ICD-10-CM

## 2020-12-18 NOTE — Telephone Encounter (Signed)
Called to schedule dialysis catheter placement, no answer, no vm. AW

## 2020-12-19 ENCOUNTER — Other Ambulatory Visit: Payer: Self-pay | Admitting: Student

## 2020-12-20 ENCOUNTER — Encounter (HOSPITAL_COMMUNITY): Payer: Self-pay

## 2020-12-20 ENCOUNTER — Other Ambulatory Visit: Payer: Self-pay

## 2020-12-20 ENCOUNTER — Ambulatory Visit (HOSPITAL_COMMUNITY)
Admission: RE | Admit: 2020-12-20 | Discharge: 2020-12-20 | Disposition: A | Discharge status: Home / Self Care | Payer: Medicare Other | Source: Ambulatory Visit | Attending: Nephrology | Admitting: Nephrology

## 2020-12-20 DIAGNOSIS — Z7982 Long term (current) use of aspirin: Secondary | ICD-10-CM | POA: Insufficient documentation

## 2020-12-20 DIAGNOSIS — Z79899 Other long term (current) drug therapy: Secondary | ICD-10-CM | POA: Insufficient documentation

## 2020-12-20 DIAGNOSIS — N289 Disorder of kidney and ureter, unspecified: Secondary | ICD-10-CM | POA: Diagnosis not present

## 2020-12-20 DIAGNOSIS — Z87891 Personal history of nicotine dependence: Secondary | ICD-10-CM | POA: Diagnosis not present

## 2020-12-20 DIAGNOSIS — N189 Chronic kidney disease, unspecified: Secondary | ICD-10-CM

## 2020-12-20 DIAGNOSIS — E1122 Type 2 diabetes mellitus with diabetic chronic kidney disease: Secondary | ICD-10-CM | POA: Insufficient documentation

## 2020-12-20 DIAGNOSIS — I129 Hypertensive chronic kidney disease with stage 1 through stage 4 chronic kidney disease, or unspecified chronic kidney disease: Secondary | ICD-10-CM | POA: Insufficient documentation

## 2020-12-20 DIAGNOSIS — N184 Chronic kidney disease, stage 4 (severe): Secondary | ICD-10-CM | POA: Diagnosis not present

## 2020-12-20 DIAGNOSIS — D631 Anemia in chronic kidney disease: Secondary | ICD-10-CM

## 2020-12-20 DIAGNOSIS — Z4901 Encounter for fitting and adjustment of extracorporeal dialysis catheter: Secondary | ICD-10-CM | POA: Diagnosis not present

## 2020-12-20 HISTORY — PX: IR FLUORO GUIDE CV LINE LEFT: IMG2282

## 2020-12-20 HISTORY — PX: IR US GUIDE VASC ACCESS LEFT: IMG2389

## 2020-12-20 LAB — GLUCOSE, CAPILLARY: Glucose-Capillary: 123 mg/dL — ABNORMAL HIGH (ref 70–99)

## 2020-12-20 MED ORDER — CEFAZOLIN SODIUM-DEXTROSE 2-4 GM/100ML-% IV SOLN: 2.0000 g | INTRAVENOUS | Status: AC

## 2020-12-20 MED ORDER — MIDAZOLAM HCL 2 MG/2ML IJ SOLN
INTRAMUSCULAR | Status: AC | PRN
  Administered 2020-12-20: 1 mg via INTRAVENOUS

## 2020-12-20 MED ORDER — FENTANYL CITRATE (PF) 100 MCG/2ML IJ SOLN
INTRAMUSCULAR | Status: AC | PRN
  Administered 2020-12-20: 50 ug via INTRAVENOUS

## 2020-12-20 MED ORDER — CEFAZOLIN SODIUM-DEXTROSE 2-4 GM/100ML-% IV SOLN
INTRAVENOUS | Status: AC
  Administered 2020-12-20: 2 g via INTRAVENOUS
  Filled 2020-12-20: qty 100

## 2020-12-20 MED ORDER — MIDAZOLAM HCL 2 MG/2ML IJ SOLN
INTRAMUSCULAR | Status: AC
  Filled 2020-12-20: qty 2

## 2020-12-20 MED ORDER — LIDOCAINE HCL 1 % IJ SOLN
INTRAMUSCULAR | Status: AC | PRN
  Administered 2020-12-20: 10 mL

## 2020-12-20 MED ORDER — SODIUM CHLORIDE 0.9 % IV SOLN: INTRAVENOUS | Status: DC

## 2020-12-20 MED ORDER — FENTANYL CITRATE (PF) 100 MCG/2ML IJ SOLN
INTRAMUSCULAR | Status: AC
  Filled 2020-12-20: qty 2

## 2020-12-20 MED ORDER — LIDOCAINE-EPINEPHRINE 1 %-1:100000 IJ SOLN
INTRAMUSCULAR | Status: AC
  Filled 2020-12-20: qty 1

## 2020-12-20 MED ORDER — HEPARIN SODIUM (PORCINE) 1000 UNIT/ML IJ SOLN
INTRAMUSCULAR | Status: AC
  Filled 2020-12-20: qty 1

## 2020-12-20 NOTE — Procedures (Signed)
  Procedure: R IJ tunneled HD CVC placement   EBL:   minimal Complications:  none immediate  See full dictation in [employer].  Dillard Cannon MD Main # [phone] Pager  [phone] Mobile [phone]

## 2020-12-20 NOTE — Sedation Documentation (Signed)
Attempt made x 1 to call report to Short Stay. Per Lattie Haw, RN to call back. Will call back in 10 minutes.

## 2020-12-20 NOTE — Discharge Instructions (Signed)
Central Line Dialysis Access Placement, Care After This sheet gives you information about how to care for yourself after your procedure. Your health care provider may also give you more specific instructions. If you have problems or questions, contact your health care provider. What can I expect after the procedure? After the procedure, it is common to have:  Mild pain or discomfort.  Mild redness, swelling, or bruising around your incision.  A small amount of blood or clear fluid coming from your incision. Follow these instructions at home: Medicines  Take over-the-counter and prescription medicines only as told by your health care provider.  If you were prescribed an antibiotic medicine, take it as told by your health care provider. Do not stop using the antibiotic even if you start to feel better. Incision care  Follow instructions from your health care provider about how to take care of your incision. Make sure you: ? Wash your hands with soap and water for at least 20 seconds before and after you change your bandage (dressing). If soap and water are not available, use hand sanitizer. ? Change your dressing as told by your health care provider. ? Leave stitches (sutures) in place.  Check your incision area every day for signs of infection. Check for: ? More redness, swelling, or pain. ? More fluid or blood. ? Warmth. ? Pus or a bad smell.   Managing pain and swelling  If directed, apply heat to the affected area as often as told by your health care provider. Use the heat source that your health care provider recommends, such as a moist heat pack or a heating pad. ? Place a towel between your skin and the heat source. ? Leave the heat on for 20-30 minutes. ? Remove the heat if your skin turns bright red. This is especially important if you are unable to feel pain, heat, or cold. You may have a greater risk of getting burned.  If directed, put ice on the catheter site. To do  this: ? Put ice in a plastic bag. ? Place a towel between your skin and the bag. ? Leave the ice on for 20 minutes, 2-3 times a day. Activity  If you were given a sedative during the procedure, it can affect you for several hours. Do not drive or operate machinery until your health care provider says that it is safe.  Do not lift anything that is heavier than 10 lb (4.5 kg), or the limit that you are told, until your health care provider says that it is safe.  Return to your normal activities as told by your health care provider. Ask your health care provider what activities are safe for you. General instructions  Follow instructions from your health care provider about eating or drinking restrictions.  Do not use any products that contain nicotine or tobacco, such as cigarettes, e-cigarettes, and chewing tobacco. These can delay incision healing after surgery. If you need help quitting, ask your health care provider.  Do not take baths, swim, or use a hot tub until your health care provider approves. Ask your health care provider if you may take showers. You may only be allowed to take sponge baths.  Wear compression stockings as told by your health care provider. These stockings help to prevent blood clots and reduce swelling in your legs.  Keep all follow-up visits as told by your health care provider. This is important.   Contact a health care provider if:  Your catheter gets pulled   out of place.  Your catheter site becomes itchy.  You develop a rash around your catheter site.  You have any of these signs of infection: ? More redness, swelling, or pain around your incision. ? More fluid or blood coming from your incision. ? Warmth coming from your incision. ? Pus or a bad smell coming from your incision. ? A fever. Get help right away if:  You become light-headed or dizzy.  You have chest pain or a fast heart rate.  You faint.  You have difficulty breathing.  Your  catheter gets pulled out completely.  You have discomfort in your arms, back, neck, or jaw. These symptoms may represent a serious problem that is an emergency. Do not wait to see if the symptoms will go away. Get medical help right away. Call your local emergency services (911 in the [place] Do not drive yourself to the hospital. Summary  After the procedure, it is common to have mild symptoms of pain, redness, swelling, or bruising around your incision. It is also common to have a small amount of blood or clear fluid coming from your incision.  Check your incision area every day for signs of infection.  Do not take baths, swim, or use a hot tub until your health care provider approves. Ask your health care provider if you may take showers. You may only be allowed to take sponge baths.  Get help right away if you have chest pain or difficulty breathing, or if your catheter gets pulled out completely. This information is not intended to replace advice given to you by your health care provider. Make sure you discuss any questions you have with your health care provider. Document Revised: 08/18/2019 Document Reviewed: 08/18/2019 Elsevier Patient Education  2021 [employer].

## 2020-12-20 NOTE — H&P (Signed)
Chief Complaint: Patient was seen in consultation today for tunneled dialysis catheter placement at the request of [place] S  Referring Physician(s): [place] S  Supervising Physician: Arne Cleveland  Patient Status: [facility] - Out-pt  History of Present Illness: Steve [name] is a 85 y.o. male   CKD23 Follows with Dr Theador Hawthorne To initiate dialysis per notes Pt states he was evaluated by "Vascular doctor"  a year ago Was unable to have fistula placed secondary "vessels too small" In chart:  11/10/19 note of Dr Oneida Alar:  ASSESSMENT: Patient needs long-term hemodialysis access.  He does not have any vein suitable for a fistula.  He would require most likely a left upper arm AV graft.  Pt has since followed with Dr Theador Hawthorne Has haled off on dialysis til now Need for dialysis initiation asap Pt to hear from Dr Theador Hawthorne this week or next for initiation Here now for dialysis catheter placement  Past Medical History:  Diagnosis Date  . Anemia   . Arthritis   . CKD (chronic kidney disease) stage 4, GFR 15-29 ml/min (HCC)   . Essential hypertension   . GERD (gastroesophageal reflux disease)   . Hyperlipidemia   . Prostate cancer [facility])    took radiation  . Renal insufficiency   . Type 2 diabetes mellitus ([place])     Past Surgical History:  Procedure Laterality Date  . ANTERIOR CERVICAL DECOMP/DISCECTOMY FUSION N/A 08/23/2020   Procedure: Cervical 3-4 Anterior cervical decompression/discectomy/fusion;  Surgeon: Erline Levine, MD;  Location: [facility];  Service: Neurosurgery;  Laterality: N/A;  3C/RM 21 to follow  . CATARACT EXTRACTION W/PHACO Right 04/28/2016   Procedure: CATARACT EXTRACTION PHACO AND INTRAOCULAR LENS PLACEMENT (IOC);  Surgeon: Williams Che, MD;  Location: [facility];  Service: Ophthalmology;  Laterality: Right;  CDE: 4.28  . COLONOSCOPY     about 2 years in [place]   . CYSTECTOMY     pt. denies    Allergies: Patient has no known  allergies.  Medications: Prior to Admission medications   Medication Sig Start Date End Date Taking? Authorizing Provider  ascorbic acid (VITAMIN C) 500 MG tablet Take 500 mg by mouth daily.   Yes [provider]  aspirin 81 MG tablet Take 81 mg by mouth daily.   Yes [provider]  cholecalciferol (VITAMIN D3) 25 MCG (1000 UNIT) tablet Take 1,000 Units by mouth daily.   Yes [provider]  epoetin alfa-epbx (RETACRIT) 4000 UNIT/ML injection Inject 4,000 Units into the skin every 14 (fourteen) days.    Yes Bhutani, Manpreet S, MD  furosemide (LASIX) 20 MG tablet Take 1 tablet (20 mg total) by mouth daily. 04/22/20  Yes Barton Dubois, MD  gabapentin (NEURONTIN) 300 MG capsule Take 300 mg by mouth 2 (two) times daily.  01/19/15  Yes [provider]  hydrALAZINE (APRESOLINE) 100 MG tablet Take 100 mg by mouth 3 (three) times daily.    Yes [provider]  isosorbide mononitrate (IMDUR) 30 MG 24 hr tablet Take 1 tablet (30 mg total) by mouth daily. 04/19/20  Yes Barton Dubois, MD  lamoTRIgine (LAMICTAL) 25 MG tablet Take 25 mg by mouth 2 (two) times daily.   Yes [provider]  linagliptin (TRADJENTA) 5 MG TABS tablet Take 5 mg by mouth daily.   Yes [provider]  metoprolol tartrate (LOPRESSOR) 25 MG tablet Take 1 tablet (25 mg total) by mouth 2 (two) times daily. 04/18/20  Yes Barton Dubois, MD  Multiple Vitamin (MULTIVITAMIN WITH MINERALS)  TABS tablet Take 1 tablet by mouth daily.   Yes [provider]  NIFEdipine (PROCARDIA XL/ADALAT-CC) 90 MG 24 hr tablet TAKE (1) TABLET BY MOUTH ONCE DAILY. Patient taking differently: Take 90 mg by mouth daily before breakfast. 06/17/18  Yes Herminio Commons, MD  pantoprazole (PROTONIX) 40 MG tablet Take 1 tablet (40 mg total) by mouth daily. Patient taking differently: Take 40 mg by mouth daily before breakfast. 04/19/20  Yes Barton Dubois, MD  pravastatin (PRAVACHOL) 40 MG tablet Take  40 mg by mouth daily.  04/06/13  Yes [provider]  predniSONE (DELTASONE) 5 MG tablet Take 5 mg by mouth daily with breakfast.   Yes [provider]  sodium bicarbonate 650 MG tablet Take 650 mg by mouth 2 (two) times daily.  06/24/19  Yes [provider]  tamsulosin (FLOMAX) 0.4 MG CAPS Take 0.4 mg by mouth daily.  04/06/13  Yes [provider]  Accu-Chek Softclix Lancets lancets  10/27/19   [provider]  albuterol (PROVENTIL HFA;VENTOLIN HFA) 108 (90 Base) MCG/ACT inhaler Inhale 1-2 puffs into the lungs every 6 (six) hours as needed for wheezing or shortness of breath. Patient not taking: Reported on 04/18/2020    [provider]  furosemide (LASIX) 40 MG tablet Take 80 mg by mouth 2 (two) times daily.    [provider]  glucose blood (ACCU-CHEK AVIVA PLUS) test strip  11/27/17   [provider]  Lancets Misc. ([place]) KIT  10/28/19   [provider]  methocarbamol (ROBAXIN) 500 MG tablet Take 1 tablet (500 mg total) by mouth every 6 (six) hours as needed for muscle spasms. 08/27/20   Ashok Pall, MD  [facility] VERIO test strip 1 each 2 (two) times daily. 12/21/19   [provider]  traMADol (ULTRAM) 50 MG tablet Take 1 tablet (50 mg total) by mouth every 12 (twelve) hours as needed for severe pain. Patient not taking: Reported on 08/16/2020 04/18/20   Barton Dubois, MD  zolpidem (AMBIEN) 5 MG tablet 5 mg at bedtime as needed.  Patient not taking: Reported on 08/16/2020 07/11/19   [provider]     Family History  Problem Relation Age of Onset  . Heart disease Mother        cause of death  . Diabetes Sister   . Colon cancer Neg Hx     Social History   Socioeconomic History  . Marital status: Divorced    Spouse name: Not on file  . Number of children: Not on file  . Years of education: Not on file  . Highest education level: Not on file  Occupational History  .  Occupation: Retired  Tobacco Use  . Smoking status: Former Smoker    Packs/day: 2.00    Years: 25.00    Pack years: 50.00    Types: Pipe, Cigars    Start date: 11/08/1954    Quit date: 10/14/1987    Years since quitting: 33.2  . Smokeless tobacco: Never Used  . Tobacco comment: About 2 pipes a day  Vaping Use  . Vaping Use: Never used  Substance and Sexual Activity  . Alcohol use: No    Alcohol/week: 0.0 standard drinks  . Drug use: No  . Sexual activity: Not on file  Other Topics Concern  . Not on file  Social History Narrative  . Not on file   Social Determinants of Health   Financial Resource Strain: Not on file  Food  Insecurity: Not on file  Transportation Needs: Not on file  Physical Activity: Not on file  Stress: Not on file  Social Connections: Not on file    Review of Systems: A 12 point ROS discussed and pertinent positives are indicated in the HPI above.  All other systems are negative.  Review of Systems  Constitutional: Negative for activity change, fatigue and fever.  Respiratory: Negative for cough and shortness of breath.   Cardiovascular: Negative for chest pain.  Gastrointestinal: Negative for abdominal pain and nausea.  Neurological: Negative for weakness.  Psychiatric/Behavioral: Negative for behavioral problems and confusion.    Vital Signs: BP (!) 166/69   Pulse 72   Temp 98.3 F (36.8 C) (Oral)   Resp 17   Ht 5' 5"  (1.651 m)   Wt 170 lb (77.1 kg)   SpO2 99%   BMI 28.29 kg/m   Physical Exam Vitals reviewed.  HENT:     Mouth/Throat:     Mouth: Mucous membranes are moist.  Cardiovascular:     Rate and Rhythm: Normal rate and regular rhythm.     Heart sounds: Normal heart sounds.  Pulmonary:     Effort: Pulmonary effort is normal.     Breath sounds: Normal breath sounds.  Abdominal:     Palpations: Abdomen is soft.  Musculoskeletal:        General: Normal range of motion.  Skin:    General: Skin is warm.  Neurological:      Mental Status: He is alert and oriented to person, place, and time.  Psychiatric:        Behavior: Behavior normal.     Imaging: No results found.  Labs:  CBC: Recent Labs    09/11/20 1626 09/25/20 1502 10/23/20 1429 10/23/20 1442 11/06/20 1436 12/04/20 1446 12/04/20 1451 12/11/20 1401  WBC 6.6  --  4.9  --   --  5.6  --  5.8  HGB 9.0*   < > 9.5*   < > 9.2* 10.1* 10.2* 9.8*  HCT 26.9*  --  29.0*  --   --  30.5*  --  29.6*  PLT 225  --  239  --   --  237  --  241   < > = values in this interval not displayed.    COAGS: Recent Labs    04/18/20 0501  INR 1.1  APTT 34    BMP: Recent Labs    02/29/20 0830 04/17/20 2009 04/18/20 0501 07/03/20 1315 07/31/20 1512 09/11/20 1626 10/23/20 1429 12/04/20 1446 12/11/20 1401  NA 135 136 137 136   < > 133* 136 138 138  K 5.2* 4.1 4.0 4.4   < > 5.1 4.4 5.7* 4.8  CL 103 104 106 104   < > 101 105 104 106  CO2 20* 21* 22 20*   < > 21* 20* 24 20*  GLUCOSE 109* 117* 136* 177*   < > 88 139* 106* 134*  BUN 37* 45* 45* 70*   < > 53* 58* 78* 70*  CALCIUM 9.5 8.8* 8.7* 9.1   < > 9.1 9.1 9.6 9.1  9.2  CREATININE 3.83* 4.56* 4.43* 5.98*   < > 5.40* 5.19* 6.65* 6.32*  GFRNONAA 14* 11* 12* 8*   < > 10* 10* 8* 8*  GFRAA 16* 13* 13* 9*  --   --   --   --   --    < > = values in this interval not displayed.  LIVER FUNCTION TESTS: Recent Labs    02/29/20 0830 04/18/20 0501 07/03/20 1315 09/11/20 1626 10/23/20 1429 12/04/20 1446 12/11/20 1401  BILITOT 0.4 0.6  --   --   --   --   --   AST 21 16  --   --   --   --   --   ALT 19 13  --   --   --   --   --   ALKPHOS 67 56  --   --   --   --   --   PROT 7.5 7.1  --   --   --   --   --   ALBUMIN 4.0 3.7   < > 3.6 3.7 3.8 3.6   < > = values in this interval not displayed.    TUMOR MARKERS: No results for input(s): AFPTM, CEA, CA199, CHROMGRNA in the last 8760 hours.  Assessment and Plan:  CKD4 Now need to start dialysis per Dr Theador Hawthorne Was evaluated by Dr Oneida Alar 1 yr  ag-- no veins suitable for fistula at that time Dr Theador Hawthorne is to get back with [facility] when need per note Now scheduled for tunneled dialysis catheter to start dialysis asap Risks and benefits discussed with the patient including, but not limited to bleeding, infection, vascular injury, pneumothorax which may require chest tube placement, air embolism or even death  All of the patient's questions were answered, patient is agreeable to proceed. Consent signed and in chart.   Thank you for this interesting consult.  I greatly enjoyed meeting Eeshan Verbrugge and look forward to participating in their care.  A copy of this report was sent to the requesting provider on this date.  Electronically Signed: Lavonia Drafts, PA-C 12/20/2020, 9:46 AM   I spent a total of  30 Minutes   in face to face in clinical consultation, greater than 50% of which was counseling/coordinating care for tunneled HD catheter placement

## 2020-12-21 ENCOUNTER — Other Ambulatory Visit (HOSPITAL_COMMUNITY): Payer: Self-pay | Admitting: Nephrology

## 2020-12-21 DIAGNOSIS — Z23 Encounter for immunization: Secondary | ICD-10-CM | POA: Diagnosis not present

## 2020-12-21 DIAGNOSIS — Z992 Dependence on renal dialysis: Secondary | ICD-10-CM | POA: Diagnosis not present

## 2020-12-21 DIAGNOSIS — N186 End stage renal disease: Secondary | ICD-10-CM | POA: Diagnosis not present

## 2020-12-21 DIAGNOSIS — E1122 Type 2 diabetes mellitus with diabetic chronic kidney disease: Secondary | ICD-10-CM

## 2020-12-21 DIAGNOSIS — E119 Type 2 diabetes mellitus without complications: Secondary | ICD-10-CM | POA: Diagnosis not present

## 2020-12-21 DIAGNOSIS — N185 Chronic kidney disease, stage 5: Secondary | ICD-10-CM

## 2020-12-21 DIAGNOSIS — D631 Anemia in chronic kidney disease: Secondary | ICD-10-CM

## 2020-12-22 DIAGNOSIS — Z992 Dependence on renal dialysis: Secondary | ICD-10-CM | POA: Diagnosis not present

## 2020-12-22 DIAGNOSIS — Z23 Encounter for immunization: Secondary | ICD-10-CM | POA: Diagnosis not present

## 2020-12-22 DIAGNOSIS — N186 End stage renal disease: Secondary | ICD-10-CM | POA: Diagnosis not present

## 2020-12-25 DIAGNOSIS — N186 End stage renal disease: Secondary | ICD-10-CM | POA: Diagnosis not present

## 2020-12-25 DIAGNOSIS — Z992 Dependence on renal dialysis: Secondary | ICD-10-CM | POA: Diagnosis not present

## 2020-12-25 DIAGNOSIS — Z23 Encounter for immunization: Secondary | ICD-10-CM | POA: Diagnosis not present

## 2020-12-27 ENCOUNTER — Telehealth: Payer: Self-pay | Admitting: Vascular Surgery

## 2020-12-27 DIAGNOSIS — N186 End stage renal disease: Secondary | ICD-10-CM | POA: Diagnosis not present

## 2020-12-27 DIAGNOSIS — Z992 Dependence on renal dialysis: Secondary | ICD-10-CM | POA: Diagnosis not present

## 2020-12-27 DIAGNOSIS — Z23 Encounter for immunization: Secondary | ICD-10-CM | POA: Diagnosis not present

## 2020-12-27 NOTE — Telephone Encounter (Signed)
Steve Dutch, MD  [facility] Charge [facility]; [facility] Admin Pool; [name] Pt needs appt to discuss AV access. He does not need vein map. He has bad veins and will need a graft but we have not seen him for a year   Steve [name]    I have made several attempts over the last week to contact this patient to schedule appointment with no results. We are unable to leave a message and there has been no answer on both home and mobile numbers.

## 2020-12-29 DIAGNOSIS — N186 End stage renal disease: Secondary | ICD-10-CM | POA: Diagnosis not present

## 2020-12-29 DIAGNOSIS — Z23 Encounter for immunization: Secondary | ICD-10-CM | POA: Diagnosis not present

## 2020-12-29 DIAGNOSIS — Z992 Dependence on renal dialysis: Secondary | ICD-10-CM | POA: Diagnosis not present

## 2021-01-01 DIAGNOSIS — Z992 Dependence on renal dialysis: Secondary | ICD-10-CM | POA: Diagnosis not present

## 2021-01-01 DIAGNOSIS — N186 End stage renal disease: Secondary | ICD-10-CM | POA: Diagnosis not present

## 2021-01-01 DIAGNOSIS — Z23 Encounter for immunization: Secondary | ICD-10-CM | POA: Diagnosis not present

## 2021-01-03 DIAGNOSIS — Z23 Encounter for immunization: Secondary | ICD-10-CM | POA: Diagnosis not present

## 2021-01-03 DIAGNOSIS — Z992 Dependence on renal dialysis: Secondary | ICD-10-CM | POA: Diagnosis not present

## 2021-01-03 DIAGNOSIS — N186 End stage renal disease: Secondary | ICD-10-CM | POA: Diagnosis not present

## 2021-01-05 DIAGNOSIS — Z23 Encounter for immunization: Secondary | ICD-10-CM | POA: Diagnosis not present

## 2021-01-05 DIAGNOSIS — N186 End stage renal disease: Secondary | ICD-10-CM | POA: Diagnosis not present

## 2021-01-05 DIAGNOSIS — Z992 Dependence on renal dialysis: Secondary | ICD-10-CM | POA: Diagnosis not present

## 2021-01-08 DIAGNOSIS — N186 End stage renal disease: Secondary | ICD-10-CM | POA: Diagnosis not present

## 2021-01-08 DIAGNOSIS — Z992 Dependence on renal dialysis: Secondary | ICD-10-CM | POA: Diagnosis not present

## 2021-01-08 DIAGNOSIS — Z23 Encounter for immunization: Secondary | ICD-10-CM | POA: Diagnosis not present

## 2021-01-08 DIAGNOSIS — D509 Iron deficiency anemia, unspecified: Secondary | ICD-10-CM | POA: Diagnosis not present

## 2021-01-10 DIAGNOSIS — Z992 Dependence on renal dialysis: Secondary | ICD-10-CM | POA: Diagnosis not present

## 2021-01-10 DIAGNOSIS — N186 End stage renal disease: Secondary | ICD-10-CM | POA: Diagnosis not present

## 2021-01-10 DIAGNOSIS — Z23 Encounter for immunization: Secondary | ICD-10-CM | POA: Diagnosis not present

## 2021-01-12 DIAGNOSIS — Z23 Encounter for immunization: Secondary | ICD-10-CM | POA: Diagnosis not present

## 2021-01-12 DIAGNOSIS — N186 End stage renal disease: Secondary | ICD-10-CM | POA: Diagnosis not present

## 2021-01-12 DIAGNOSIS — Z992 Dependence on renal dialysis: Secondary | ICD-10-CM | POA: Diagnosis not present

## 2021-01-15 DIAGNOSIS — Z23 Encounter for immunization: Secondary | ICD-10-CM | POA: Diagnosis not present

## 2021-01-15 DIAGNOSIS — N186 End stage renal disease: Secondary | ICD-10-CM | POA: Diagnosis not present

## 2021-01-15 DIAGNOSIS — Z992 Dependence on renal dialysis: Secondary | ICD-10-CM | POA: Diagnosis not present

## 2021-01-17 DIAGNOSIS — E559 Vitamin D deficiency, unspecified: Secondary | ICD-10-CM | POA: Diagnosis not present

## 2021-01-17 DIAGNOSIS — N186 End stage renal disease: Secondary | ICD-10-CM | POA: Diagnosis not present

## 2021-01-17 DIAGNOSIS — Z23 Encounter for immunization: Secondary | ICD-10-CM | POA: Diagnosis not present

## 2021-01-17 DIAGNOSIS — Z992 Dependence on renal dialysis: Secondary | ICD-10-CM | POA: Diagnosis not present

## 2021-01-19 DIAGNOSIS — Z23 Encounter for immunization: Secondary | ICD-10-CM | POA: Diagnosis not present

## 2021-01-19 DIAGNOSIS — Z992 Dependence on renal dialysis: Secondary | ICD-10-CM | POA: Diagnosis not present

## 2021-01-19 DIAGNOSIS — N186 End stage renal disease: Secondary | ICD-10-CM | POA: Diagnosis not present

## 2021-01-22 DIAGNOSIS — Z23 Encounter for immunization: Secondary | ICD-10-CM | POA: Diagnosis not present

## 2021-01-22 DIAGNOSIS — N186 End stage renal disease: Secondary | ICD-10-CM | POA: Diagnosis not present

## 2021-01-22 DIAGNOSIS — Z992 Dependence on renal dialysis: Secondary | ICD-10-CM | POA: Diagnosis not present

## 2021-01-24 DIAGNOSIS — Z23 Encounter for immunization: Secondary | ICD-10-CM | POA: Diagnosis not present

## 2021-01-24 DIAGNOSIS — Z992 Dependence on renal dialysis: Secondary | ICD-10-CM | POA: Diagnosis not present

## 2021-01-24 DIAGNOSIS — N186 End stage renal disease: Secondary | ICD-10-CM | POA: Diagnosis not present

## 2021-01-26 DIAGNOSIS — N186 End stage renal disease: Secondary | ICD-10-CM | POA: Diagnosis not present

## 2021-01-26 DIAGNOSIS — Z23 Encounter for immunization: Secondary | ICD-10-CM | POA: Diagnosis not present

## 2021-01-26 DIAGNOSIS — Z992 Dependence on renal dialysis: Secondary | ICD-10-CM | POA: Diagnosis not present

## 2021-01-29 DIAGNOSIS — N186 End stage renal disease: Secondary | ICD-10-CM | POA: Diagnosis not present

## 2021-01-29 DIAGNOSIS — Z23 Encounter for immunization: Secondary | ICD-10-CM | POA: Diagnosis not present

## 2021-01-29 DIAGNOSIS — Z992 Dependence on renal dialysis: Secondary | ICD-10-CM | POA: Diagnosis not present

## 2021-01-31 DIAGNOSIS — Z23 Encounter for immunization: Secondary | ICD-10-CM | POA: Diagnosis not present

## 2021-01-31 DIAGNOSIS — Z992 Dependence on renal dialysis: Secondary | ICD-10-CM | POA: Diagnosis not present

## 2021-01-31 DIAGNOSIS — N186 End stage renal disease: Secondary | ICD-10-CM | POA: Diagnosis not present

## 2021-02-02 DIAGNOSIS — N186 End stage renal disease: Secondary | ICD-10-CM | POA: Diagnosis not present

## 2021-02-02 DIAGNOSIS — Z23 Encounter for immunization: Secondary | ICD-10-CM | POA: Diagnosis not present

## 2021-02-02 DIAGNOSIS — Z992 Dependence on renal dialysis: Secondary | ICD-10-CM | POA: Diagnosis not present

## 2021-02-05 ENCOUNTER — Other Ambulatory Visit: Payer: Self-pay

## 2021-02-05 DIAGNOSIS — D509 Iron deficiency anemia, unspecified: Secondary | ICD-10-CM | POA: Diagnosis not present

## 2021-02-05 DIAGNOSIS — Z992 Dependence on renal dialysis: Secondary | ICD-10-CM | POA: Diagnosis not present

## 2021-02-05 DIAGNOSIS — N184 Chronic kidney disease, stage 4 (severe): Secondary | ICD-10-CM

## 2021-02-05 DIAGNOSIS — N186 End stage renal disease: Secondary | ICD-10-CM | POA: Diagnosis not present

## 2021-02-05 DIAGNOSIS — N2581 Secondary hyperparathyroidism of renal origin: Secondary | ICD-10-CM | POA: Diagnosis not present

## 2021-02-05 DIAGNOSIS — Z23 Encounter for immunization: Secondary | ICD-10-CM | POA: Diagnosis not present

## 2021-02-07 DIAGNOSIS — Z992 Dependence on renal dialysis: Secondary | ICD-10-CM | POA: Diagnosis not present

## 2021-02-07 DIAGNOSIS — N186 End stage renal disease: Secondary | ICD-10-CM | POA: Diagnosis not present

## 2021-02-07 DIAGNOSIS — Z23 Encounter for immunization: Secondary | ICD-10-CM | POA: Diagnosis not present

## 2021-02-07 DIAGNOSIS — E46 Unspecified protein-calorie malnutrition: Secondary | ICD-10-CM | POA: Diagnosis not present

## 2021-02-09 DIAGNOSIS — Z992 Dependence on renal dialysis: Secondary | ICD-10-CM | POA: Diagnosis not present

## 2021-02-09 DIAGNOSIS — Z23 Encounter for immunization: Secondary | ICD-10-CM | POA: Diagnosis not present

## 2021-02-09 DIAGNOSIS — N186 End stage renal disease: Secondary | ICD-10-CM | POA: Diagnosis not present

## 2021-02-12 DIAGNOSIS — Z23 Encounter for immunization: Secondary | ICD-10-CM | POA: Diagnosis not present

## 2021-02-12 DIAGNOSIS — N186 End stage renal disease: Secondary | ICD-10-CM | POA: Diagnosis not present

## 2021-02-12 DIAGNOSIS — Z992 Dependence on renal dialysis: Secondary | ICD-10-CM | POA: Diagnosis not present

## 2021-02-13 DIAGNOSIS — Z992 Dependence on renal dialysis: Secondary | ICD-10-CM | POA: Diagnosis not present

## 2021-02-13 DIAGNOSIS — N186 End stage renal disease: Secondary | ICD-10-CM | POA: Diagnosis not present

## 2021-02-13 DIAGNOSIS — Z23 Encounter for immunization: Secondary | ICD-10-CM | POA: Diagnosis not present

## 2021-02-14 ENCOUNTER — Other Ambulatory Visit: Payer: Self-pay

## 2021-02-14 ENCOUNTER — Encounter: Payer: Self-pay | Admitting: Vascular Surgery

## 2021-02-14 ENCOUNTER — Inpatient Hospital Stay (HOSPITAL_COMMUNITY): Admission: RE | Admit: 2021-02-14 | Payer: Medicare Other | Source: Ambulatory Visit

## 2021-02-14 ENCOUNTER — Ambulatory Visit: Payer: Medicare Other | Admitting: Vascular Surgery

## 2021-02-14 VITALS — BP 136/65 | HR 56 | Temp 97.7°F | Resp 20 | Ht 65.0 in | Wt 170.0 lb

## 2021-02-14 DIAGNOSIS — Z992 Dependence on renal dialysis: Secondary | ICD-10-CM | POA: Diagnosis not present

## 2021-02-14 DIAGNOSIS — N186 End stage renal disease: Secondary | ICD-10-CM

## 2021-02-14 NOTE — Progress Notes (Signed)
Patient is an 85-year-old male who returns for follow-up today.  He was last seen in January 2021.  At that time we evaluated him for hemodialysis access.  He had no suitable veins for creation of a fistula.  He was not on dialysis at that point.  We deferred placing a graft.  Since then he has now started hemodialysis.  He has a catheter in place.  He is right-handed.  He states he has been tolerating dialysis well.  Past Medical History:  Diagnosis Date  . Anemia   . Arthritis   . CKD (chronic kidney disease) stage 4, GFR 15-29 ml/min (HCC)   . Essential hypertension   . GERD (gastroesophageal reflux disease)   . Hyperlipidemia   . Prostate cancer (HCC)    took radiation  . Renal insufficiency   . Type 2 diabetes mellitus (HCC)     Past Surgical History:  Procedure Laterality Date  . ANTERIOR CERVICAL DECOMP/DISCECTOMY FUSION N/A 08/23/2020   Procedure: Cervical 3-4 Anterior cervical decompression/discectomy/fusion;  Surgeon: Stern, Joseph, MD;  [facility];  Service: Neurosurgery;  Laterality: N/A;  3C/RM 21 to follow  . CATARACT EXTRACTION W/PHACO Right 04/28/2016   Procedure: CATARACT EXTRACTION PHACO AND INTRAOCULAR LENS PLACEMENT (IOC);  Surgeon: Carroll F Haines, MD;  Location: [facility];  Service: Ophthalmology;  Laterality: Right;  CDE: 4.28  . COLONOSCOPY     about 2 years in [place]   . CYSTECTOMY     pt. denies  . IR FLUORO GUIDE CV LINE LEFT  12/20/2020  . IR US GUIDE VASC ACCESS LEFT  12/20/2020    Current Outpatient Medications on File Prior to Visit  Medication Sig Dispense Refill  . Accu-Chek Softclix Lancets lancets     . albuterol (PROVENTIL HFA;VENTOLIN HFA) 108 (90 Base) MCG/ACT inhaler Inhale 1-2 puffs into the lungs every 6 (six) hours as needed for wheezing or shortness of breath.    . ascorbic acid (VITAMIN C) 500 MG tablet Take 500 mg by mouth daily.    . aspirin 81 MG tablet Take 81 mg by mouth daily.    . cholecalciferol (VITAMIN D3) 25 MCG (1000 UNIT)  tablet Take 1,000 Units by mouth daily.    . epoetin alfa-epbx (RETACRIT) 4000 UNIT/ML injection Inject 4,000 Units into the skin every 14 (fourteen) days.     . furosemide (LASIX) 20 MG tablet Take 1 tablet (20 mg total) by mouth daily.    . furosemide (LASIX) 40 MG tablet Take 80 mg by mouth 2 (two) times daily.    . gabapentin (NEURONTIN) 300 MG capsule Take 300 mg by mouth 2 (two) times daily.     . glucose blood (ACCU-CHEK AVIVA PLUS) test strip     . hydrALAZINE (APRESOLINE) 100 MG tablet Take 100 mg by mouth 3 (three) times daily.     . isosorbide mononitrate (IMDUR) 30 MG 24 hr tablet Take 1 tablet (30 mg total) by mouth daily. 30 tablet 1  . lamoTRIgine (LAMICTAL) 25 MG tablet Take 25 mg by mouth 2 (two) times daily.    . Lancets Misc. (ACCU-CHEK SOFTCLIX LANCET DEV) KIT     . linagliptin (TRADJENTA) 5 MG TABS tablet Take 5 mg by mouth daily.    . methocarbamol (ROBAXIN) 500 MG tablet Take 1 tablet (500 mg total) by mouth every 6 (six) hours as needed for muscle spasms. 45 tablet 0  . metoprolol tartrate (LOPRESSOR) 25 MG tablet Take 1 tablet (25 mg total) by mouth 2 (two)   times daily.    . Multiple Vitamin (MULTIVITAMIN WITH MINERALS) TABS tablet Take 1 tablet by mouth daily.    . NIFEdipine (PROCARDIA XL/ADALAT-CC) 90 MG 24 hr tablet TAKE (1) TABLET BY MOUTH ONCE DAILY. (Patient taking differently: Take 90 mg by mouth daily before breakfast.) 30 tablet 6  . ONETOUCH VERIO test strip 1 each 2 (two) times daily.    . pantoprazole (PROTONIX) 40 MG tablet Take 1 tablet (40 mg total) by mouth daily. (Patient taking differently: Take 40 mg by mouth daily before breakfast.) 30 tablet 1  . pravastatin (PRAVACHOL) 40 MG tablet Take 40 mg by mouth daily.     . predniSONE (DELTASONE) 5 MG tablet Take 5 mg by mouth daily with breakfast.    . sodium bicarbonate 650 MG tablet Take 650 mg by mouth 2 (two) times daily.     . tamsulosin (FLOMAX) 0.4 MG CAPS Take 0.4 mg by mouth daily.     . traMADol  (ULTRAM) 50 MG tablet Take 1 tablet (50 mg total) by mouth every 12 (twelve) hours as needed for severe pain.    . zolpidem (AMBIEN) 5 MG tablet 5 mg at bedtime as needed.     No current facility-administered medications on file prior to visit.    Physical exam:  Vitals:   02/14/21 1500  BP: 136/65  Pulse: (!) 56  Resp: 20  Temp: 97.7 F (36.5 C)  SpO2: 97%  Weight: 170 lb (77.1 kg)  Height: 5' 5" (1.651 m)    Extremities: 2+ brachial radial pulses bilaterally  Data: I reviewed his previous vein mapping ultrasound which showed no adequate vein for fistula creation.  I also reviewed his previous upper extremity arterial duplex exam which showed no significant arterial occlusive disease.  Assessment: Patient needs long-term hemodialysis access.  Plan: Patient will be scheduled for a left upper arm graft.  Since the patient lives in [place] we will schedule him with my partner Dr. Early at [facility] which will be more convenient for the patient transportation wise.  Risk benefits possible complications and procedure details including not limited to bleeding infection graft thrombosis hemic steal were discussed with patient today.  He understands and agrees to proceed.  Elvera Almario, MD [facility] Office: [phone]  

## 2021-02-14 NOTE — H&P (View-Only) (Signed)
Patient is an 85 year old male who returns for follow-up today.  He was last seen in January 2021.  At that time we evaluated him for hemodialysis access.  He had no suitable veins for creation of a fistula.  He was not on dialysis at that point.  We deferred placing a graft.  Since then he has now started hemodialysis.  He has a catheter in place.  He is right-handed.  He states he has been tolerating dialysis well.  Past Medical History:  Diagnosis Date  . Anemia   . Arthritis   . CKD (chronic kidney disease) stage 4, GFR 15-29 ml/min (HCC)   . Essential hypertension   . GERD (gastroesophageal reflux disease)   . Hyperlipidemia   . Prostate cancer [facility])    took radiation  . Renal insufficiency   . Type 2 diabetes mellitus ([place])     Past Surgical History:  Procedure Laterality Date  . ANTERIOR CERVICAL DECOMP/DISCECTOMY FUSION N/A 08/23/2020   Procedure: Cervical 3-4 Anterior cervical decompression/discectomy/fusion;  Surgeon: Erline Levine, MD;  Location: [facility];  Service: Neurosurgery;  Laterality: N/A;  3C/RM 21 to follow  . CATARACT EXTRACTION W/PHACO Right 04/28/2016   Procedure: CATARACT EXTRACTION PHACO AND INTRAOCULAR LENS PLACEMENT (IOC);  Surgeon: Williams Che, MD;  Location: [facility];  Service: Ophthalmology;  Laterality: Right;  CDE: 4.28  . COLONOSCOPY     about 2 years in [place]   . CYSTECTOMY     pt. denies  . IR FLUORO GUIDE CV LINE LEFT  12/20/2020  . IR US GUIDE VASC ACCESS LEFT  12/20/2020    Current Outpatient Medications on File Prior to Visit  Medication Sig Dispense Refill  . Accu-Chek Softclix Lancets lancets     . albuterol (PROVENTIL HFA;VENTOLIN HFA) 108 (90 Base) MCG/ACT inhaler Inhale 1-2 puffs into the lungs every 6 (six) hours as needed for wheezing or shortness of breath.    Marland Kitchen ascorbic acid (VITAMIN C) 500 MG tablet Take 500 mg by mouth daily.    Marland Kitchen aspirin 81 MG tablet Take 81 mg by mouth daily.    . cholecalciferol (VITAMIN D3) 25 MCG (1000 UNIT)  tablet Take 1,000 Units by mouth daily.    Marland Kitchen epoetin alfa-epbx (RETACRIT) 4000 UNIT/ML injection Inject 4,000 Units into the skin every 14 (fourteen) days.     . furosemide (LASIX) 20 MG tablet Take 1 tablet (20 mg total) by mouth daily.    . furosemide (LASIX) 40 MG tablet Take 80 mg by mouth 2 (two) times daily.    Marland Kitchen gabapentin (NEURONTIN) 300 MG capsule Take 300 mg by mouth 2 (two) times daily.     Marland Kitchen glucose blood (ACCU-CHEK AVIVA PLUS) test strip     . hydrALAZINE (APRESOLINE) 100 MG tablet Take 100 mg by mouth 3 (three) times daily.     . isosorbide mononitrate (IMDUR) 30 MG 24 hr tablet Take 1 tablet (30 mg total) by mouth daily. 30 tablet 1  . lamoTRIgine (LAMICTAL) 25 MG tablet Take 25 mg by mouth 2 (two) times daily.    . Lancets Misc. (ACCU-CHEK SOFTCLIX LANCET DEV) KIT     . linagliptin (TRADJENTA) 5 MG TABS tablet Take 5 mg by mouth daily.    . methocarbamol (ROBAXIN) 500 MG tablet Take 1 tablet (500 mg total) by mouth every 6 (six) hours as needed for muscle spasms. 45 tablet 0  . metoprolol tartrate (LOPRESSOR) 25 MG tablet Take 1 tablet (25 mg total) by mouth 2 (two)  times daily.    . Multiple Vitamin (MULTIVITAMIN WITH MINERALS) TABS tablet Take 1 tablet by mouth daily.    Marland Kitchen NIFEdipine (PROCARDIA XL/ADALAT-CC) 90 MG 24 hr tablet TAKE (1) TABLET BY MOUTH ONCE DAILY. (Patient taking differently: Take 90 mg by mouth daily before breakfast.) 30 tablet 6  . ONETOUCH VERIO test strip 1 each 2 (two) times daily.    . pantoprazole (PROTONIX) 40 MG tablet Take 1 tablet (40 mg total) by mouth daily. (Patient taking differently: Take 40 mg by mouth daily before breakfast.) 30 tablet 1  . pravastatin (PRAVACHOL) 40 MG tablet Take 40 mg by mouth daily.     . predniSONE (DELTASONE) 5 MG tablet Take 5 mg by mouth daily with breakfast.    . sodium bicarbonate 650 MG tablet Take 650 mg by mouth 2 (two) times daily.     . tamsulosin (FLOMAX) 0.4 MG CAPS Take 0.4 mg by mouth daily.     . traMADol  (ULTRAM) 50 MG tablet Take 1 tablet (50 mg total) by mouth every 12 (twelve) hours as needed for severe pain.    Marland Kitchen zolpidem (AMBIEN) 5 MG tablet 5 mg at bedtime as needed.     No current facility-administered medications on file prior to visit.    Physical exam:  Vitals:   02/14/21 1500  BP: 136/65  Pulse: (!) 56  Resp: 20  Temp: 97.7 F (36.5 C)  SpO2: 97%  Weight: 170 lb (77.1 kg)  Height: _0  (1.651 m)    Extremities: 2+ brachial radial pulses bilaterally  Data: I reviewed his previous vein mapping ultrasound which showed no adequate vein for fistula creation.  I also reviewed his previous upper extremity arterial duplex exam which showed no significant arterial occlusive disease.  Assessment: Patient needs long-term hemodialysis access.  Plan: Patient will be scheduled for a left upper arm graft.  Since the patient lives in [place] we will schedule him with my partner Dr. Donnetta Hutching at [facility] which will be more convenient for the patient transportation wise.  Risk benefits possible complications and procedure details including not limited to bleeding infection graft thrombosis hemic steal were discussed with patient today.  He understands and agrees to proceed.  Ruta Hinds, MD [facility] Office: [phone]

## 2021-02-15 NOTE — Patient Instructions (Signed)
Steve [name]  02/15/2021     @PREFPERIOPPHARMACY @   Your procedure is scheduled on  02/21/2021.   Report to [facility] at  [phone]  A.M.   Call this number if you have problems the morning of surgery:  [phone]   Remember:  Do not eat or drink after midnight.                         Take these medicines the morning of surgery with A SIP OF WATER  Gabapentin, hydralazine, metoprolol, procardia, tramadol (if needed).    DO NOT take any medications for diabetes the morning of your procedure.  If your glucose is 70 or below the morning of your procedure, drink 1/2 cup of clear liquid containing sugar and recheck your glucose in 15 minutes. If your glucose is  Still 70 or below, call [phone] for instructions.  If your glucose is 300 or above the morning of your procedure, call [phone] for instructions.     Please brush your teeth.  Do not wear jewelry, make-up or nail polish.  Do not wear lotions, powders, or perfumes, or deodorant.  Do not shave 48 hours prior to surgery.  Men may shave face and neck.  Do not bring valuables to the hospital.  [facility] is not responsible for any belongings or valuables.  Contacts, dentures or bridgework may not be worn into surgery.  Leave your suitcase in the car.  After surgery it may be brought to your room.  For patients admitted to the hospital, discharge time will be determined by your treatment team.  Patients discharged the day of surgery will not be allowed to drive home and must have someone with them for 24 hours.   Special instructions:  DO NOT smoke tobacco or vape for 24 hours before you come.    Your COVID test is 02/19/2021 @ 0945.   Place clean sheets on your bed the night before your procedure and DO NOT sleep with pets this night.  Shower with CHG the night before and the morning of your procedure. DO NOT use CHG on your face, hair or genitals.  After each shower, dry off with a clean towel,  put on clean, comfortable clothes and brush your teeth.   Please read over the following fact sheets that you were given. Anesthesia Post-op Instructions and Care and Recovery After Surgery       AV Fistula Placement, Care After The following information offers guidance on how to care for yourself after your procedure. Your health care provider may also give you more specific instructions. If you have problems or questions, contact your health care provider. What can I expect after the procedure? After the procedure, it is common to have:  Soreness at the fistula site.  Vibration (thrill) over the fistula. Follow these instructions at home: Medicines  Take over-the-counter and prescription medicines only as told by your health care provider.  Ask your health care provider if the medicine prescribed to you can cause constipation. You may need to take these actions to prevent or treat constipation: ? Drink enough fluid to keep your urine pale yellow. ? Take over-the-counter or prescription medicines. ? Eat foods that are high in fiber, such as beans, whole grains, and fresh fruits and vegetables. ? Limit foods that are high in fat and processed sugars, such as fried or sweet foods. Incision care Follow instructions from your health care  provider about how to take care of your incision. Make sure you:  Wash your hands with soap and water for at least 20 seconds before and after you change your bandage (dressing). If soap and water are not available, use hand sanitizer.  Change your dressing as told by your health care provider.  Leave stitches (sutures), skin glue, or adhesive strips in place. These skin closures may need to stay in place for 2 weeks or longer. If adhesive strip edges start to loosen and curl up, you may trim the loose edges. Do not remove adhesive strips completely unless your health care provider tells you to do that.   Fistula care  Check your fistula site every  day to make sure the thrill feels the same.  Check your fistula site every day for signs of infection. Check for: ? More redness, swelling, or pain. ? Fluid or blood. ? Warmth. ? Pus or a bad smell.  Raise (elevate) the affected area above the level of your heart while you are sitting or lying down.  Do not lift anything that is heavier than 10 lb (4.5 kg), or the limit that you are told, until your health care provider says that it is safe.  Do not lie down on your fistula arm.  Do not let anyone draw blood or take a blood pressure reading on your fistula arm. This is important.  Do not wear tight jewelry or clothing over your fistula arm.   Bathing  Do not take baths, swim, or use a hot tub until your health care provider approves. Ask your health care provider if you may take showers. You may only be allowed to take sponge baths.  Keep the area around your incision clean and dry. General instructions  Rest at home for a day or two.  If you were given a sedative during the procedure, it can affect you for several hours. Do not drive or operate machinery until your health care provider says that it is safe.  Return to your normal activities as told. Ask your health care provider what activities are safe for you.  Keep all follow-up visits. This is important. Contact a health care provider if:  You have more redness, swelling, or pain around your fistula site.  Your fistula site feels warm to the touch.  You have pus or a bad smell coming from your fistula site.  You have a fever or chills.  You feel numb or cold in your arm or your fistula site.  You feel a decrease or a change in the thrill over the fistula. Get help right away if:  You have bleeding from your fistula site that will not stop.  You have chest pain.  You have trouble breathing. These symptoms may represent a serious problem that is an emergency. Do not wait to see if the symptoms will go away. Get  medical help right away. Call your local emergency services (911 in the [place] Do not drive yourself to the hospital. Summary  Follow instructions from your health care provider about how to take care of your incision.  Do not let anyone draw blood or take a blood pressure reading on your fistula arm. This is important.  Contact a health care provider if you have a change in the thrill or have any signs of infection at your fistula site.  Keep all follow-up visits. This is important. This information is not intended to replace advice given to you by your health  care provider. Make sure you discuss any questions you have with your health care provider. Document Revised: 05/09/2020 Document Reviewed: 05/09/2020 Elsevier Patient Education  2021 [employer] After This sheet gives you information about how to care for yourself after your procedure. Your health care provider may also give you more specific instructions. If you have problems or questions, contact your health care provider. What can I expect after the procedure? After the procedure, it is common to have:  Tiredness.  Forgetfulness about what happened after the procedure.  Impaired judgment for important decisions.  Nausea or vomiting.  Some difficulty with balance. Follow these instructions at home: For the time period you were told by your health care provider:  Rest as needed.  Do not participate in activities where you could fall or become injured.  Do not drive or use machinery.  Do not drink alcohol.  Do not take sleeping pills or medicines that cause drowsiness.  Do not make important decisions or sign legal documents.  Do not take care of children on your own.      Eating and drinking  Follow the diet that is recommended by your health care provider.  Drink enough fluid to keep your urine pale yellow.  If you vomit: ? Drink water, juice, or soup when you can drink  without vomiting. ? Make sure you have little or no nausea before eating solid foods. General instructions  Have a responsible adult stay with you for the time you are told. It is important to have someone help care for you until you are awake and alert.  Take over-the-counter and prescription medicines only as told by your health care provider.  If you have sleep apnea, surgery and certain medicines can increase your risk for breathing problems. Follow instructions from your health care provider about wearing your sleep device: ? Anytime you are sleeping, including during daytime naps. ? While taking prescription pain medicines, sleeping medicines, or medicines that make you drowsy.  Avoid smoking.  Keep all follow-up visits as told by your health care provider. This is important. Contact a health care provider if:  You keep feeling nauseous or you keep vomiting.  You feel light-headed.  You are still sleepy or having trouble with balance after 24 hours.  You develop a rash.  You have a fever.  You have redness or swelling around the IV site. Get help right away if:  You have trouble breathing.  You have new-onset confusion at home. Summary  For several hours after your procedure, you may feel tired. You may also be forgetful and have poor judgment.  Have a responsible adult stay with you for the time you are told. It is important to have someone help care for you until you are awake and alert.  Rest as told. Do not drive or operate machinery. Do not drink alcohol or take sleeping pills.  Get help right away if you have trouble breathing, or if you suddenly become confused. This information is not intended to replace advice given to you by your health care provider. Make sure you discuss any questions you have with your health care provider. Document Revised: 06/14/2020 Document Reviewed: 09/01/2019 Elsevier Patient Education  2021 [employer].

## 2021-02-16 DIAGNOSIS — Z23 Encounter for immunization: Secondary | ICD-10-CM | POA: Diagnosis not present

## 2021-02-16 DIAGNOSIS — Z992 Dependence on renal dialysis: Secondary | ICD-10-CM | POA: Diagnosis not present

## 2021-02-16 DIAGNOSIS — N186 End stage renal disease: Secondary | ICD-10-CM | POA: Diagnosis not present

## 2021-02-18 ENCOUNTER — Other Ambulatory Visit: Payer: Self-pay

## 2021-02-18 ENCOUNTER — Encounter (HOSPITAL_COMMUNITY)
Admission: RE | Admit: 2021-02-18 | Discharge: 2021-02-18 | Disposition: A | Discharge status: Home / Self Care | Payer: Medicare Other | Source: Ambulatory Visit | Attending: Vascular Surgery | Admitting: Vascular Surgery

## 2021-02-19 ENCOUNTER — Other Ambulatory Visit (HOSPITAL_COMMUNITY): Payer: Medicare Other

## 2021-02-19 ENCOUNTER — Ambulatory Visit: Payer: Medicare Other | Admitting: Urology

## 2021-02-19 ENCOUNTER — Other Ambulatory Visit (HOSPITAL_COMMUNITY)
Admission: RE | Admit: 2021-02-19 | Discharge: 2021-02-19 | Disposition: A | Discharge status: Home / Self Care | Payer: Medicare Other | Source: Ambulatory Visit | Attending: Vascular Surgery | Admitting: Vascular Surgery

## 2021-02-19 ENCOUNTER — Other Ambulatory Visit: Payer: Self-pay

## 2021-02-19 DIAGNOSIS — Z20822 Contact with and (suspected) exposure to covid-19: Secondary | ICD-10-CM | POA: Insufficient documentation

## 2021-02-19 DIAGNOSIS — N186 End stage renal disease: Secondary | ICD-10-CM | POA: Diagnosis not present

## 2021-02-19 DIAGNOSIS — Z992 Dependence on renal dialysis: Secondary | ICD-10-CM | POA: Diagnosis not present

## 2021-02-19 DIAGNOSIS — Z23 Encounter for immunization: Secondary | ICD-10-CM | POA: Diagnosis not present

## 2021-02-19 DIAGNOSIS — Z01812 Encounter for preprocedural laboratory examination: Secondary | ICD-10-CM | POA: Insufficient documentation

## 2021-02-19 LAB — SARS CORONAVIRUS 2 (TAT 6-24 HRS): SARS Coronavirus 2: NEGATIVE

## 2021-02-20 DIAGNOSIS — Z992 Dependence on renal dialysis: Secondary | ICD-10-CM | POA: Diagnosis not present

## 2021-02-20 DIAGNOSIS — Z23 Encounter for immunization: Secondary | ICD-10-CM | POA: Diagnosis not present

## 2021-02-20 DIAGNOSIS — N186 End stage renal disease: Secondary | ICD-10-CM | POA: Diagnosis not present

## 2021-02-21 ENCOUNTER — Encounter (HOSPITAL_COMMUNITY)
Admission: RE | Disposition: A | Discharge status: Home / Self Care | Payer: Self-pay | Source: Home / Self Care | Attending: Vascular Surgery

## 2021-02-21 ENCOUNTER — Telehealth: Payer: Self-pay

## 2021-02-21 ENCOUNTER — Ambulatory Visit (HOSPITAL_COMMUNITY)
Admission: RE | Admit: 2021-02-21 | Discharge: 2021-02-21 | Disposition: A | Discharge status: Home / Self Care | Payer: Medicare Other | Attending: Vascular Surgery | Admitting: Vascular Surgery

## 2021-02-21 ENCOUNTER — Ambulatory Visit (HOSPITAL_COMMUNITY): Payer: Medicare Other | Admitting: Anesthesiology

## 2021-02-21 DIAGNOSIS — Z992 Dependence on renal dialysis: Secondary | ICD-10-CM | POA: Diagnosis not present

## 2021-02-21 DIAGNOSIS — Z7952 Long term (current) use of systemic steroids: Secondary | ICD-10-CM | POA: Diagnosis not present

## 2021-02-21 DIAGNOSIS — Z79899 Other long term (current) drug therapy: Secondary | ICD-10-CM | POA: Diagnosis not present

## 2021-02-21 DIAGNOSIS — N186 End stage renal disease: Secondary | ICD-10-CM | POA: Insufficient documentation

## 2021-02-21 DIAGNOSIS — Z7982 Long term (current) use of aspirin: Secondary | ICD-10-CM | POA: Insufficient documentation

## 2021-02-21 DIAGNOSIS — Z7984 Long term (current) use of oral hypoglycemic drugs: Secondary | ICD-10-CM | POA: Insufficient documentation

## 2021-02-21 DIAGNOSIS — I120 Hypertensive chronic kidney disease with stage 5 chronic kidney disease or end stage renal disease: Secondary | ICD-10-CM | POA: Insufficient documentation

## 2021-02-21 DIAGNOSIS — G8918 Other acute postprocedural pain: Secondary | ICD-10-CM | POA: Diagnosis not present

## 2021-02-21 DIAGNOSIS — E1122 Type 2 diabetes mellitus with diabetic chronic kidney disease: Secondary | ICD-10-CM | POA: Insufficient documentation

## 2021-02-21 DIAGNOSIS — N185 Chronic kidney disease, stage 5: Secondary | ICD-10-CM

## 2021-02-21 HISTORY — PX: AV FISTULA PLACEMENT: SHX1204

## 2021-02-21 LAB — POCT I-STAT, CHEM 8
BUN: 20 mg/dL (ref 8–23)
Calcium, Ion: 1.08 mmol/L — ABNORMAL LOW (ref 1.15–1.40)
Chloride: 97 mmol/L — ABNORMAL LOW (ref 98–111)
Creatinine, Ser: 4.2 mg/dL — ABNORMAL HIGH (ref 0.61–1.24)
Glucose, Bld: 130 mg/dL — ABNORMAL HIGH (ref 70–99)
HCT: 36 % — ABNORMAL LOW (ref 39.0–52.0)
Hemoglobin: 12.2 g/dL — ABNORMAL LOW (ref 13.0–17.0)
Potassium: 3.9 mmol/L (ref 3.5–5.1)
Sodium: 136 mmol/L (ref 135–145)
TCO2: 27 mmol/L (ref 22–32)

## 2021-02-21 SURGERY — INSERTION OF ARTERIOVENOUS (AV) GORE-TEX GRAFT ARM
Anesthesia: Regional | Site: Arm Upper | Laterality: Left

## 2021-02-21 MED ORDER — OXYCODONE-ACETAMINOPHEN 5-325 MG PO TABS: 1.0000 | ORAL_TABLET | Freq: Four times a day (QID) | ORAL | 0 refills | Status: DC | PRN

## 2021-02-21 MED ORDER — SODIUM CHLORIDE 0.9 % IV SOLN: INTRAVENOUS | Status: DC

## 2021-02-21 MED ORDER — FENTANYL CITRATE (PF) 100 MCG/2ML IJ SOLN
INTRAMUSCULAR | Status: AC
  Filled 2021-02-21: qty 2

## 2021-02-21 MED ORDER — PROPOFOL 10 MG/ML IV BOLUS
INTRAVENOUS | Status: AC
  Filled 2021-02-21: qty 20

## 2021-02-21 MED ORDER — LIDOCAINE HCL (PF) 2 % IJ SOLN
INTRAMUSCULAR | Status: AC
  Filled 2021-02-21: qty 5

## 2021-02-21 MED ORDER — VASOPRESSIN 20 UNIT/ML IV SOLN
INTRAVENOUS | Status: AC
  Filled 2021-02-21: qty 1

## 2021-02-21 MED ORDER — MIDAZOLAM HCL 2 MG/2ML IJ SOLN
INTRAMUSCULAR | Status: AC
  Filled 2021-02-21: qty 2

## 2021-02-21 MED ORDER — DIPHENHYDRAMINE HCL 50 MG/ML IJ SOLN
INTRAMUSCULAR | Status: DC | PRN
  Administered 2021-02-21: 12.5 mg via INTRAVENOUS

## 2021-02-21 MED ORDER — HEPARIN SODIUM (PORCINE) 1000 UNIT/ML IJ SOLN
INTRAMUSCULAR | Status: AC
  Filled 2021-02-21: qty 6

## 2021-02-21 MED ORDER — DEXAMETHASONE SODIUM PHOSPHATE 4 MG/ML IJ SOLN
INTRAMUSCULAR | Status: DC | PRN
  Administered 2021-02-21: 4 mg via INTRAVENOUS

## 2021-02-21 MED ORDER — CHLORHEXIDINE GLUCONATE 4 % EX LIQD: 60.0000 mL | Freq: Once | CUTANEOUS | Status: DC

## 2021-02-21 MED ORDER — PROPOFOL 500 MG/50ML IV EMUL
INTRAVENOUS | Status: DC | PRN
  Administered 2021-02-21: 30 ug/kg/min via INTRAVENOUS

## 2021-02-21 MED ORDER — SODIUM CHLORIDE (PF) 0.9 % IJ SOLN
INTRAMUSCULAR | Status: AC
  Filled 2021-02-21: qty 10

## 2021-02-21 MED ORDER — MEPIVACAINE HCL (PF) 1.5 % IJ SOLN
INTRAMUSCULAR | Status: DC | PRN
  Administered 2021-02-21: 30 mL via PERINEURAL

## 2021-02-21 MED ORDER — MEPIVACAINE HCL (PF) 1.5 % IJ SOLN
INTRAMUSCULAR | Status: AC
  Filled 2021-02-21: qty 30

## 2021-02-21 MED ORDER — ONDANSETRON HCL 4 MG/2ML IJ SOLN
INTRAMUSCULAR | Status: DC | PRN
  Administered 2021-02-21: 4 mg via INTRAVENOUS

## 2021-02-21 MED ORDER — SODIUM CHLORIDE 0.9 % IV SOLN
INTRAVENOUS | Status: DC | PRN
  Administered 2021-02-21: 30 mL

## 2021-02-21 MED ORDER — LIDOCAINE HCL (PF) 1 % IJ SOLN
INTRAMUSCULAR | Status: AC
  Filled 2021-02-21: qty 30

## 2021-02-21 MED ORDER — ORAL CARE MOUTH RINSE: 15.0000 mL | Freq: Once | OROMUCOSAL | Status: AC

## 2021-02-21 MED ORDER — LIDOCAINE 2% (20 MG/ML) 5 ML SYRINGE
INTRAMUSCULAR | Status: DC | PRN
  Administered 2021-02-21: 40 mg via INTRAVENOUS

## 2021-02-21 MED ORDER — PHENYLEPHRINE 40 MCG/ML (10ML) SYRINGE FOR IV PUSH (FOR BLOOD PRESSURE SUPPORT)
PREFILLED_SYRINGE | INTRAVENOUS | Status: AC
  Filled 2021-02-21: qty 10

## 2021-02-21 MED ORDER — CHLORHEXIDINE GLUCONATE 0.12 % MT SOLN
15.0000 mL | Freq: Once | OROMUCOSAL | Status: AC
  Administered 2021-02-21: 15 mL via OROMUCOSAL

## 2021-02-21 MED ORDER — ONDANSETRON HCL 4 MG/2ML IJ SOLN
INTRAMUSCULAR | Status: AC
  Filled 2021-02-21: qty 2

## 2021-02-21 MED ORDER — FENTANYL CITRATE (PF) 100 MCG/2ML IJ SOLN: 25.0000 ug | INTRAMUSCULAR | Status: DC | PRN

## 2021-02-21 MED ORDER — MIDAZOLAM HCL 2 MG/2ML IJ SOLN: 2.0000 mg | Freq: Once | INTRAMUSCULAR | Status: DC

## 2021-02-21 MED ORDER — CEFAZOLIN SODIUM-DEXTROSE 2-4 GM/100ML-% IV SOLN
2.0000 g | INTRAVENOUS | Status: AC
  Administered 2021-02-21: 2 g via INTRAVENOUS
  Filled 2021-02-21: qty 100

## 2021-02-21 MED ORDER — 0.9 % SODIUM CHLORIDE (POUR BTL) OPTIME
TOPICAL | Status: DC | PRN
  Administered 2021-02-21: 1000 mL

## 2021-02-21 MED ORDER — MIDAZOLAM HCL 2 MG/2ML IJ SOLN
INTRAMUSCULAR | Status: DC | PRN
  Administered 2021-02-21: 1 mg via INTRAVENOUS

## 2021-02-21 MED ORDER — PHENYLEPHRINE HCL-NACL 10-0.9 MG/250ML-% IV SOLN
INTRAVENOUS | Status: AC
  Filled 2021-02-21: qty 250

## 2021-02-21 MED ORDER — DIPHENHYDRAMINE HCL 50 MG/ML IJ SOLN
INTRAMUSCULAR | Status: AC
  Filled 2021-02-21: qty 1

## 2021-02-21 MED ORDER — LIDOCAINE-EPINEPHRINE 0.5 %-1:200000 IJ SOLN
INTRAMUSCULAR | Status: AC
  Filled 2021-02-21: qty 1

## 2021-02-21 MED ORDER — LIDOCAINE HCL (PF) 1 % IJ SOLN
INTRAMUSCULAR | Status: DC | PRN
  Administered 2021-02-21: 3 mL

## 2021-02-21 MED ORDER — ONDANSETRON HCL 4 MG/2ML IJ SOLN: 4.0000 mg | Freq: Once | INTRAMUSCULAR | Status: DC | PRN

## 2021-02-21 MED ORDER — DEXAMETHASONE SODIUM PHOSPHATE 10 MG/ML IJ SOLN
INTRAMUSCULAR | Status: AC
  Filled 2021-02-21: qty 1

## 2021-02-21 SURGICAL SUPPLY — 40 items
ADH SKN CLS APL DERMABOND .7 (GAUZE/BANDAGES/DRESSINGS) ×1
ARMBAND PINK RESTRICT EXTREMIT (MISCELLANEOUS) ×2 IMPLANT
BAG HAMPER (MISCELLANEOUS) ×2 IMPLANT
CANNULA VESSEL 3MM 2 BLNT TIP (CANNULA) ×2 IMPLANT
CLIP LIGATING EXTRA MED SLVR (CLIP) ×2 IMPLANT
CLIP LIGATING EXTRA SM BLUE (MISCELLANEOUS) ×2 IMPLANT
COVER LIGHT HANDLE STERIS (MISCELLANEOUS) ×4 IMPLANT
COVER WAND RF STERILE (DRAPES) ×2 IMPLANT
DECANTER SPIKE VIAL GLASS SM (MISCELLANEOUS) ×2 IMPLANT
DERMABOND ADVANCED (GAUZE/BANDAGES/DRESSINGS) ×1
DERMABOND ADVANCED .7 DNX12 (GAUZE/BANDAGES/DRESSINGS) ×1 IMPLANT
ELECT REM PT RETURN 9FT ADLT (ELECTROSURGICAL) ×2
ELECTRODE REM PT RTRN 9FT ADLT (ELECTROSURGICAL) ×1 IMPLANT
GAUZE SPONGE 4X4 12PLY STRL (GAUZE/BANDAGES/DRESSINGS) ×4 IMPLANT
GLOVE SS BIOGEL STRL SZ 7.5 (GLOVE) ×1 IMPLANT
GLOVE SUPERSENSE BIOGEL SZ 7.5 (GLOVE) ×1
GLOVE SURG UNDER POLY LF SZ7 (GLOVE) ×6 IMPLANT
GOWN STRL REUS W/TWL LRG LVL3 (GOWN DISPOSABLE) ×10 IMPLANT
GRAFT GORETEX STRT 4-7X45 (Vascular Products) ×2 IMPLANT
IV NS 500ML (IV SOLUTION) ×4
IV NS 500ML BAXH (IV SOLUTION) ×2 IMPLANT
KIT BLADEGUARD II DBL (SET/KITS/TRAYS/PACK) ×2 IMPLANT
KIT TURNOVER KIT A (KITS) ×2 IMPLANT
MANIFOLD NEPTUNE II (INSTRUMENTS) ×2 IMPLANT
MARKER SKIN DUAL TIP RULER LAB (MISCELLANEOUS) ×4 IMPLANT
NEEDLE HYPO 18GX1.5 BLUNT FILL (NEEDLE) ×2 IMPLANT
NS IRRIG 1000ML POUR BTL (IV SOLUTION) ×2 IMPLANT
PACK CV ACCESS (CUSTOM PROCEDURE TRAY) ×2 IMPLANT
PAD ARMBOARD 7.5X6 YLW CONV (MISCELLANEOUS) ×4 IMPLANT
SET BASIN LINEN APH (SET/KITS/TRAYS/PACK) ×2 IMPLANT
SUT PROLENE 6 0 CC (SUTURE) ×2 IMPLANT
SUT SILK 2 0 PERMA HAND 18 BK (SUTURE) ×2 IMPLANT
SUT SILK 2 0 SH (SUTURE) ×2 IMPLANT
SUT VIC AB 3-0 SH 27 (SUTURE) ×2
SUT VIC AB 3-0 SH 27X BRD (SUTURE) ×1 IMPLANT
SYR 10ML LL (SYRINGE) ×2 IMPLANT
SYR CONTROL 10ML LL (SYRINGE) ×2 IMPLANT
SYR TOOMEY 50ML (SYRINGE) ×2 IMPLANT
TOWEL GREEN STERILE (TOWEL DISPOSABLE) ×2 IMPLANT
UNDERPAD 30X36 HEAVY ABSORB (UNDERPADS AND DIAPERS) ×2 IMPLANT

## 2021-02-21 NOTE — Interval H&P Note (Signed)
History and Physical Interval Note:  02/21/2021 7:22 AM  Steve [name]  has presented today for surgery, with the diagnosis of ESRD.  The various methods of treatment have been discussed with the patient and family. After consideration of risks, benefits and other options for treatment, the patient has consented to  Procedure(s): INSERTION OF ARTERIOVENOUS GORE-TEX GRAFT LEFT ARM (Left) as a surgical intervention.  The patient's history has been reviewed, patient examined, no change in status, stable for surgery.  I have reviewed the patient's chart and labs.  Questions were answered to the patient's satisfaction.     Curt Jews

## 2021-02-21 NOTE — Anesthesia Postprocedure Evaluation (Signed)
Anesthesia Post Note  Patient: Steve [name]  Procedure(s) Performed: INSERTION OF ARTERIOVENOUS GORE-TEX GRAFT LEFT ARM (Left Arm Upper)  Patient location during evaluation: PACU Anesthesia Type: Regional and General Level of consciousness: awake and alert and oriented Pain management: pain level controlled Vital Signs Assessment: post-procedure vital signs reviewed and stable Respiratory status: spontaneous breathing and respiratory function stable Cardiovascular status: blood pressure returned to baseline and stable Postop Assessment: no apparent nausea or vomiting Anesthetic complications: no   No complications documented.   Last Vitals:  Vitals:   02/21/21 0930 02/21/21 0948  BP: (!) 145/68 (!) 177/83  Pulse: 72 74  Resp: 16 18  Temp:  36.4 C  SpO2: 100% 99%    Last Pain:  Vitals:   02/21/21 0948  TempSrc: Axillary  PainSc: 0-No pain                 Steve [name]

## 2021-02-21 NOTE — Addendum Note (Signed)
Addendum  created 02/21/21 1129 by Denese Killings, MD   Charge Capture section accepted, Child order released for a procedure order, Clinical Note Signed, Intraprocedure Blocks edited, Intraprocedure Event edited, Intraprocedure Meds edited

## 2021-02-21 NOTE — Anesthesia Procedure Notes (Signed)
Anesthesia Regional Block: Supraclavicular block   Pre-Anesthetic Checklist: ,, timeout performed, Correct Patient, Correct Site, Correct Laterality, Correct Procedure, Correct Position, site marked, Risks and benefits discussed,  Surgical consent,  Pre-op evaluation,  At surgeon's request and post-op pain management  Laterality: Upper and Left  Prep: chloraprep       Needles:  Injection technique: Single-shot  Needle Type: Echogenic Needle      Needle Gauge: 22   Needle insertion depth: 5 cm   Additional Needles:   Procedures:,,,, ultrasound used (permanent image in chart),,,,  Narrative:  Start time: 02/21/2021 7:25 AM End time: 02/21/2021 7:36 AM  Performed by: Personally  Anesthesiologist: Denese Killings, MD  Additional Notes: Block assessed prior to start of surgery

## 2021-02-21 NOTE — Discharge Instructions (Signed)
[facility]  Discharge Instructions  AV Fistula or Graft Surgery for Dialysis Access  Please refer to the following instructions for your post-procedure care. Your surgeon or physician assistant will discuss any changes with you.  Activity  You may drive the day following your surgery, if you are comfortable and no longer taking prescription pain medication. Resume full activity as the soreness in your incision resolves.  Bathing/Showering  You may shower after you go home. Keep your incision dry for 48 hours. Do not soak in a bathtub, hot tub, or swim until the incision heals completely. You may not shower if you have a hemodialysis catheter.  Incision Care  Clean your incision with mild soap and water after 48 hours. Pat the area dry with a clean towel. You do not need a bandage unless otherwise instructed. Do not apply any ointments or creams to your incision. You may have skin glue on your incision. Do not peel it off. It will come off on its own in about one week. Your arm may swell a bit after surgery. To reduce swelling use pillows to elevate your arm so it is above your heart. Your doctor will tell you if you need to lightly wrap your arm with an ACE bandage.  Diet  Resume your normal diet. There are not special food restrictions following this procedure. In order to heal from your surgery, it is CRITICAL to get adequate nutrition. Your body requires vitamins, minerals, and protein. Vegetables are the best source of vitamins and minerals. Vegetables also provide the perfect balance of protein. Processed food has little nutritional value, so try to avoid this.  Medications  Resume taking all of your medications. If your incision is causing pain, you may take over-the counter pain relievers such as acetaminophen (Tylenol). If you were prescribed a stronger pain medication, please be aware these medications can cause nausea and constipation. Prevent  nausea by taking the medication with a snack or meal. Avoid constipation by drinking plenty of fluids and eating foods with high amount of fiber, such as fruits, vegetables, and grains.  Do not take Tylenol if you are taking prescription pain medications.  Follow up Your surgeon may want to see you in the office following your access surgery. If so, this will be arranged at the time of your surgery.  Please call us immediately for any of the following conditions:  . Increased pain, redness, drainage (pus) from your incision site . Fever of 101 degrees or higher . Severe or worsening pain at your incision site . Hand pain or numbness. .  Reduce your risk of vascular disease:  . Stop smoking. If you would like help, call [facility] at 1-800-QUIT-NOW 206-234-4414) or [place] at (208)686-1756  . Manage your cholesterol . Maintain a desired weight . Control your diabetes . Keep your blood pressure down  Dialysis  It will take several weeks to several months for your new dialysis access to be ready for use. Your surgeon will determine when it is okay to use it. Your nephrologist will continue to direct your dialysis. You can continue to use your Permcath until your new access is ready for use.   02/21/2021 Steve [name] [id number] Feb 26, 1936  Surgeon(s): Early, Arvilla Meres, MD  Procedure(s): INSERTION OF ARTERIOVENOUS GORE-TEX GRAFT LEFT ARM   May stick graft immediately   May stick graft on designated area only:    Do not stick graft for 4 weeks    If  you have any questions, please call the office at [phone].        General Anesthesia, Adult, Care After This sheet gives you information about how to care for yourself after your procedure. Your health care provider may also give you more specific instructions. If you have problems or questions, contact your health care provider. What can I expect after the procedure? After the procedure, the following side effects  are common:  Pain or discomfort at the IV site.  Nausea.  Vomiting.  Sore throat.  Trouble concentrating.  Feeling cold or chills.  Feeling weak or tired.  Sleepiness and fatigue.  Soreness and body aches. These side effects can affect parts of the body that were not involved in surgery. Follow these instructions at home: For the time period you were told by your health care provider:  Rest.  Do not participate in activities where you could fall or become injured.  Do not drive or use machinery.  Do not drink alcohol.  Do not take sleeping pills or medicines that cause drowsiness.  Do not make important decisions or sign legal documents.  Do not take care of children on your own.   Eating and drinking  Follow any instructions from your health care provider about eating or drinking restrictions.  When you feel hungry, start by eating small amounts of foods that are soft and easy to digest (bland), such as toast. Gradually return to your regular diet.  Drink enough fluid to keep your urine pale yellow.  If you vomit, rehydrate by drinking water, juice, or clear broth. General instructions  If you have sleep apnea, surgery and certain medicines can increase your risk for breathing problems. Follow instructions from your health care provider about wearing your sleep device: ? Anytime you are sleeping, including during daytime naps. ? While taking prescription pain medicines, sleeping medicines, or medicines that make you drowsy.  Have a responsible adult stay with you for the time you are told. It is important to have someone help care for you until you are awake and alert.  Return to your normal activities as told by your health care provider. Ask your health care provider what activities are safe for you.  Take over-the-counter and prescription medicines only as told by your health care provider.  If you smoke, do not smoke without supervision.  Keep all  follow-up visits as told by your health care provider. This is important. Contact a health care provider if:  You have nausea or vomiting that does not get better with medicine.  You cannot eat or drink without vomiting.  You have pain that does not get better with medicine.  You are unable to pass urine.  You develop a skin rash.  You have a fever.  You have redness around your IV site that gets worse. Get help right away if:  You have difficulty breathing.  You have chest pain.  You have blood in your urine or stool, or you vomit blood. Summary  After the procedure, it is common to have a sore throat or nausea. It is also common to feel tired.  Have a responsible adult stay with you for the time you are told. It is important to have someone help care for you until you are awake and alert.  When you feel hungry, start by eating small amounts of foods that are soft and easy to digest (bland), such as toast. Gradually return to your regular diet.  Drink enough fluid to  keep your urine pale yellow.  Return to your normal activities as told by your health care provider. Ask your health care provider what activities are safe for you. This information is not intended to replace advice given to you by your health care provider. Make sure you discuss any questions you have with your health care provider. Document Revised: 06/14/2020 Document Reviewed: 01/12/2020 Elsevier Patient Education  2021 [place] [employer]  Regional anesthesia is a method used to temporarily block feeling in one area of the body. You may have regional anesthesia before a medical procedure or surgery. A health care provider who specializes in giving anesthesia (anesthesiologist) injects a type of medicine near a nerve or a group of nerves. This medicine makes that area of the body numb. Regional anesthesia allows you to be awake during the procedure or surgery but keeps you from feeling  pain in the affected area. There are three types of regional anesthesia:  Spinal anesthesia. This is a one-time injection of medicine into the fluid that surrounds your spinal cord. This numbs the area below and slightly above the injection site.  Epidural anesthesia. This is another medicine that may be placed into your back, but just outside of the protective tissue that covers your spinal cord. Instead of a one-time injection, the medicine is often given gradually over time through a small tube (catheter)that remains in your back for as long as pain control is needed.  Peripheral nerve block. This is an injection that is given in an area of the body other than the spine to block all feeling below the injection site. Peripheral nerve blocks may be given as a single injection before your procedure or may be given through a catheter for as long as you need pain control. Regional anesthesia can be used alone or in combination with other types of anesthesia. Compared to using medicine that makes you fall asleep (general anesthetic), regional anesthesia has many benefits, such as:  Improved pain control after your surgery.  Less nausea, vomiting, or drowsiness after surgery.  A faster recovery. Tell a health care provider about:  Any allergies you have.  All medicines you are taking, including vitamins, herbs, eye drops, creams, and over-the-counter medicines.  Any use of drugs, alcohol, or tobacco.  Any problems you or family members have had with anesthetic medicines.  Any blood disorders you have.  Any surgeries you have had.  Any medical conditions you have or have had, especially heart failure, chronic obstructive pulmonary disease (COPD), or sleep apnea.  Whether you are pregnant or may be pregnant. What are the risks? Generally, this is a safe procedure. However, problems may occur, including:  Pain.  Nausea.  Vomiting.  Itching.  Low blood  pressure.  Headache.  Nerve damage.  Infection.  Bleeding around the injection site.  Trouble urinating.  Allergic reactions to medicine. What happens before the procedure? Staying hydrated Follow instructions from your health care provider about hydration, which may include:  Up to 2 hours before the procedure - you may continue to drink clear liquids, such as water, clear fruit juice, black coffee, and plain tea. Eating and drinking restrictions Follow instructions from your health care provider about eating and drinking, which may include:  8 hours before the procedure - stop eating heavy meals or foods, such as meat, fried foods, or fatty foods.  6 hours before the procedure - stop eating light meals or foods, such as toast or cereal.  6  hours before the procedure - stop drinking milk or drinks that contain milk.  2 hours before the procedure - stop drinking clear liquids. Medicines Ask your health care provider about:  Changing or stopping your regular medicines. This is especially important if you are taking diabetes medicines or blood thinners.  Taking medicines such as aspirin and ibuprofen. These medicines can thin your blood. Do not take these medicines unless your health care provider tells you to take them.  Taking over-the-counter medicines, vitamins, herbs, and supplements. General instructions  Plan to have a responsible adult take you home from the hospital or clinic.  If you will be going home right after the procedure, plan to have a responsible adult care for you for the time you are told. This is important.  You may need to have blood or imaging tests.  Ask your health care provider what steps will be taken to help prevent infection. These may include washing skin with a germ-killing soap.  If you use a sleep apnea device, ask your health care provider whether you should bring it with you on the day of your surgery. What happens during the  procedure?  Depending on the medical procedure you are having done, an IV may be inserted into one of your veins.  The anesthesiologist will do a physical exam to find the best location to give the regional anesthesia. To locate the nerve, he or she may also use: ? A device that activates the nerve and causes your muscles to twitch (nerve stimulator). ? An imaging tool that uses sound waves to create images of the area (ultrasound).  You may be given a medicine to help you relax (sedative).  A medicine called a local anesthetic may be injected to numb the area where the regional anesthetic will be injected.  You will get regional anesthesia by injection or through a catheter.  The anesthesiologist will check to make sure the medicine is working before the rest of your medical procedure begins.  Depending on the type of regional anesthesia you received, you may have a small bandage (dressing) placed over the injection site. The procedure may vary among health care providers and hospitals. What can I expect after the procedure? After your procedure, it is common to have:  Sleepiness.  Nausea.  Itching.  Numbness.  Shivering or feeling cold. Your blood pressure, heart rate, breathing rate, and blood oxygen level will be monitored until you leave the hospital or clinic. Follow these instructions at home:  If you were given a sedative during the procedure, it can affect you for several hours. Do not drive or operate machinery until your health care provider says that it is safe.  Take over-the-counter and prescription medicines only as told by your health care provider.  Do not drive, exercise, or do any other activities that require coordination as told by your health care provider. Ask your health care provider when you can return to your usual activities.  Drink enough fluid to keep your urine pale yellow.  If you had a dressing placed over the injection site, only remove it  when told to do so by your health care provider.  Keep all follow-up visits as told by your health care provider. This is important. Contact a health care provider if you:  Continue to have nausea and vomiting for more than 1 day.  Develop a rash.  Have trouble urinating. Get help right away if you:  Have bleeding from the injection site or  bleeding under the skin at the injection site.  Have redness, swelling, or pain around your injection site.  Have a fever.  Develop a headache.  Develop new numbness or weakness. Summary  Regional anesthesia is a method used to temporarily block feeling in one area of the body. It may be done to block pain during a medical procedure or surgery.  Follow instructions from your health care provider about taking medicines and about eating and drinking before the procedure.  Ask your health care provider when you can return to your usual activities after the procedure. This information is not intended to replace advice given to you by your health care provider. Make sure you discuss any questions you have with your health care provider. Document Revised: 01/27/2020 Document Reviewed: 11/15/2018 Elsevier Patient Education  2021 [place].    Acetaminophen; Oxycodone tablets What is this medicine? ACETAMINOPHEN; OXYCODONE (a set a MEE noe fen; ox i KOE done) is a pain reliever. It is used to treat moderate to severe pain. This medicine may be used for other purposes; ask your health care provider or pharmacist if you have questions. COMMON BRAND NAME(S): Endocet, Magnacet, Nalocet, Narvox, Percocet, Perloxx, Primalev, Primlev, Prolate, Roxicet, Xolox What should I tell my health care provider before I take this medicine? They need to know if you have any of these conditions:  brain tumor  drug abuse or addiction  head injury  heart disease  if you often drink alcohol   kidney disease   liver disease  low adrenal gland  function  lung disease, asthma, or breathing problem  seizures  stomach or intestine problems  taken an MAOI like Marplan, Nardil, or Parnate in the last 14 days  an unusual or allergic reaction to acetaminophen, oxycodone, other medicines, foods, dyes, or preservative  pregnant or trying to get pregnant  breast-feeding How should I use this medicine? Take this medicine by mouth with a full glass of water. Take it as directed on the label. You can take it with or without food. If it upsets your stomach, take it with food. Do not use it more often than directed. There may be unused or extra doses in the bottle after you finish your treatment. Talk to your health care provider if you have questions about your dose. A special MedGuide will be given to you by the pharmacist with each prescription and refill. Be sure to read this information carefully each time. Talk to your health care provider about the use of this medicine in children. Special care may be needed. Patients over 68 years of age may have a stronger reaction and need a smaller dose. Overdosage: If you think you have taken too much of this medicine contact a poison control center or emergency room at once. NOTE: This medicine is only for you. Do not share this medicine with others. What if I miss a dose? This does not apply. This medicine is not for regular use. It should only be used as needed. What may interact with this medicine? This medicine may interact with the following medications:  alcohol  antihistamines for allergy, cough and cold  antiviral medicines for HIV or AIDS  atropine  certain antibiotics like clarithromycin, erythromycin, linezolid, rifampin  certain medicines for anxiety or sleep  certain medicines for bladder problems like oxybutynin, tolterodine  certain medicines for depression like amitriptyline, fluoxetine, sertraline  certain medicines for fungal infections like ketoconazole,  itraconazole, voriconazole  certain medicines for migraine headache like almotriptan,  eletriptan, frovatriptan, naratriptan, rizatriptan, sumatriptan, zolmitriptan  certain medicines for nausea or vomiting like dolasetron, ondansetron, palonosetron  certain medicines for Parkinson's disease like benztropine, trihexyphenidyl  certain medicines for seizures like phenobarbital, phenytoin, primidone  certain medicines for stomach problems like dicyclomine, hyoscyamine  certain medicines for travel sickness like scopolamine  diuretics  general anesthetics like halothane, isoflurane, methoxyflurane, propofol  ipratropium  local anesthetics like lidocaine, pramoxine, tetracaine  MAOIs like Carbex, Eldepryl, Marplan, Nardil, and Parnate  medicines that relax muscles for surgery  methylene blue  nilotinib  other medicines with acetaminophen  other narcotic medicines for pain or cough  phenothiazines like chlorpromazine, mesoridazine, prochlorperazine, thioridazine This list may not describe all possible interactions. Give your health care provider a list of all the medicines, herbs, non-prescription drugs, or dietary supplements you use. Also tell them if you smoke, drink alcohol, or use illegal drugs. Some items may interact with your medicine. What should I watch for while using this medicine? Tell your health care provider if your pain does not go away, if it gets worse, or if you have new or a different type of pain. You may develop tolerance to this drug. Tolerance means that you will need a higher dose of the drug for pain relief. Tolerance is normal and is expected if you take this drug for a long time. There are different types of narcotic drugs (opioids) for pain. If you take more than one type at the same time, you may have more side effects. Give your health care provider a list of all drugs you use. He or she will tell you how much drug to take. Do not take more drug than  directed. Get emergency help right away if you have problems breathing. Do not suddenly stop taking your drug because you may develop a severe reaction. Your body becomes used to the drug. This does NOT mean you are addicted. Addiction is a behavior related to getting and using a drug for a nonmedical reason. If you have pain, you have a medical reason to take pain drug. Your health care provider will tell you how much drug to take. If your health care provider wants you to stop the drug, the dose will be slowly lowered over time to avoid any side effects. Talk to your health care provider about naloxone and how to get it. Naloxone is an emergency drug used for an opioid overdose. An overdose can happen if you take too much opioid. It can also happen if an opioid is taken with some other drugs or substances, like alcohol. Know the symptoms of an overdose, like trouble breathing, unusually tired or sleepy, or not being able to respond or wake up. Make sure to tell caregivers and close contacts where it is stored. Make sure they know how to use it. After naloxone is given, you must get emergency help right away. Naloxone is a temporary treatment. Repeat doses may be needed. Do not take other drugs that contain acetaminophen with this drug. Many non-prescription drugs contain acetaminophen. Always read labels carefully. If you have questions, ask your health care provider. If you take too much acetaminophen, get medical help right away. Too much acetaminophen can be very dangerous and cause liver damage. Even if you do not have symptoms, it is important to get help right away. This drug does not prevent a heart attack or stroke. This drug may increase the chance of a heart attack or stroke. The chance may increase the longer you use  this drug or if you have heart disease. If you take aspirin to prevent a heart attack or stroke, talk to your health care provider about using this drug. You may get drowsy or dizzy.  Do not drive, use machinery, or do anything that needs mental alertness until you know how this drug affects you. Do not stand up or sit up quickly, especially if you are an older patient. This reduces the risk of dizzy or fainting spells. Alcohol may interfere with the effect of this drug. Avoid alcoholic drinks. This drug will cause constipation. If you do not have a bowel movement for 3 days, call your health care provider. Your mouth may get dry. Chewing sugarless gum or sucking hard candy and drinking plenty of water may help. Contact your health care provider if the problem does not go away or is severe. What side effects may I notice from receiving this medicine? Side effects that you should report to your doctor or health care professional as soon as possible:  allergic reactions (skin rash, itching or hives; swelling of the face, lips, or tongue)  confusion  kidney injury (trouble passing urine or change in the amount of urine)  light-colored stool  liver injury (dark yellow or brown urine; general ill feeling or flu-like symptoms; loss of appetite, right upper belly pain; unusually weak or tired, yellowing of the eyes or skin)  low adrenal gland function (nausea; vomiting; loss of appetite; unusually weak or tired; dizziness; low blood pressure)  low blood pressure (dizziness; feeling faint or lightheaded, falls; unusually weak or tired)  redness, blistering, peeling, or loosening of the skin, including inside the mouth  serotonin syndrome (irritable; confusion; diarrhea; fast or irregular heartbeat; muscle twitching; stiff muscles; trouble walking; sweating; high fever; seizures; chills; vomiting)  trouble breathing Side effects that usually do not require medical attention (report to your doctor or health care professional if they continue or are bothersome):  constipation  dry mouth  nausea, vomiting  tiredness This list may not describe all possible side effects. Call  your doctor for medical advice about side effects. You may report side effects to FDA at 1-800-FDA-1088. Where should I keep my medicine? Keep out of the reach of children and pets. This medicine can be abused. Keep it in a safe place to protect it from theft. Do not share it with anyone. It is only for you. Selling or giving away this medicine is dangerous and against the law. Store at room temperature between 20 and 25 degrees C (68 and 77 degrees F). Protect from light. Get rid of any unused medicine after the expiration date. This medicine may cause harm and death if it is taken by other adults, children, or pets. It is important to get rid of the medicine as soon as you no longer need it or it is expired. You can do this in two ways:  Take the medicine to a medicine take-back program. Check with your pharmacy or law enforcement to find a location.  If you cannot return the medicine, flush it down the toilet. NOTE: This sheet is a summary. It may not cover all possible information. If you have questions about this medicine, talk to your doctor, pharmacist, or health care provider.  2021 Elsevier/Gold Standard (2020-06-27 11:12:15)

## 2021-02-21 NOTE — Anesthesia Preprocedure Evaluation (Addendum)
Anesthesia Evaluation  Patient identified by MRN, date of birth, ID band Patient awake    Reviewed: Allergy & Precautions, NPO status , Patient's Chart, lab work & pertinent test results, reviewed documented beta blocker date and time   Airway Mallampati: II  TM Distance: >3 FB Neck ROM: Full   Comment: ACDF Dental  (+) Dental Advisory Given, Missing   Pulmonary shortness of breath and with exertion, former smoker,    Pulmonary exam normal breath sounds clear to auscultation       Cardiovascular hypertension, Pt. on medications and Pt. on home beta blockers + angina Normal cardiovascular exam Rhythm:Regular Rate:Normal  1. Left ventricular ejection fraction, by estimation, is 65 to 70%. The left ventricle has normal function. The left ventricle has no regional wall motion abnormalities.  There is severe left ventricular hypertrophy.  Left ventricular diastolic parameters  are consistent with Grade II diastolic dysfunction (pseudonormalization).  2. Right ventricular systolic function is normal. The right ventricular size is normal.  There is moderately elevated pulmonary artery systolic  pressure. The estimated right ventricular systolic pressure is 87.6 mmHg.  3. Left atrial size was moderately dilated.  4. Right atrial size was moderately dilated.  5. The mitral valve is grossly normal. Mild to moderate mitral valve  regurgitation.  6. The aortic valve is tricuspid. Aortic valve regurgitation is not visualized. Mild to moderate aortic valve sclerosis/calcification is  present, without any evidence of aortic stenosis.  7. The inferior vena cava is normal in size with greater than 50% respiratory variability, suggesting right atrial pressure of 3 mmHg.  29-Feb-2020 10:04:08 [facility] ROUTINE RECORD Sinus rhythm Probable left atrial enlargement Nonspecific T abnormalities, inferior leads Baseline wander  in lead(s) V3    Neuro/Psych  Neuromuscular disease    GI/Hepatic GERD  Medicated,  Endo/Other  diabetes, Well Controlled, Type 2, Oral Hypoglycemic Agents  Renal/GU ESRFRenal disease     Musculoskeletal  (+) Arthritis  (cervical discectomy),   Abdominal   Peds  Hematology  (+) anemia ,   Anesthesia Other Findings Prostate cancer Acute left sided weakness  Reproductive/Obstetrics                            Anesthesia Physical Anesthesia Plan  ASA: III  Anesthesia Plan: Regional and General   Post-op Pain Management:    Induction: Intravenous  PONV Risk Score and Plan: Propofol infusion and Ondansetron  Airway Management Planned: Nasal Cannula and Natural Airway  Additional Equipment:   Intra-op Plan:   Post-operative Plan:   Informed Consent: I have reviewed the patients History and Physical, chart, labs and discussed the procedure including the risks, benefits and alternatives for the proposed anesthesia with the patient or authorized representative who has indicated his/her understanding and acceptance.     Dental advisory given  Plan Discussed with: CRNA and Surgeon  Anesthesia Plan Comments:         Anesthesia Quick Evaluation

## 2021-02-21 NOTE — Anesthesia Procedure Notes (Signed)
Date/Time: 02/21/2021 7:59 AM Performed by: Orlie Dakin, CRNA Pre-anesthesia Checklist: Patient identified, Emergency Drugs available, Suction available and Patient being monitored Patient Re-evaluated:Patient Re-evaluated prior to induction Oxygen Delivery Method: Nasal cannula Induction Type: IV induction Placement Confirmation: positive ETCO2

## 2021-02-21 NOTE — Op Note (Signed)
    OPERATIVE REPORT  DATE OF SURGERY: 02/21/2021  PATIENT: Steve [name], 85 y.o. male MRN: [id number]  DOB: 06-28-1936  PRE-OPERATIVE DIAGNOSIS: End-stage renal disease  POST-OPERATIVE DIAGNOSIS:  Same  PROCEDURE: Left forearm loop AV Gore-Tex graft  SURGEON:  Curt Jews, M.D.  PHYSICIAN ASSISTANT: Vevelyn Royals, RN  The assistant was needed for exposure and to expedite the case  ANESTHESIA: Axillary block  EBL: per anesthesia record  Total I/O In: 100 [IV Piggyback:100] Out: -   BLOOD ADMINISTERED: none  DRAINS: none  SPECIMEN: none  COUNTS CORRECT:  YES  PATIENT DISPOSITION:  PACU - hemodynamically stable  PROCEDURE DETAILS: Patient was taken everyplace supine position with area of the left arm prepped draped you sterile fashion.  SonoSite ultrasound was used to visualize the brachial artery at the antecubital space which was of good caliber.  The brachial vein was of moderate size.  The basilic and cephalic vein were inadequate for fistula creation as was seen in preoperative vein mapping.  The axillary vein was of large caliber.  Incision was made over the antecubital space and carried down to isolate the brachial artery and brachial vein which were both of good size.  Separate incision was made on the distal forearm and a loop configuration was tunneled.  A 4 x 7 mm tapered graft was brought through the tunnel.  The brachial artery was occluded proximally and distally and was opened with an 11 blade and extended longitudinally with Potts scissors.  A small arteriotomy was created.  The 4 mm portion of the graft was resected to approximately the 5 mm portion and this was spatulated and sewn end-to-side to the brachial artery with a running 6-0 Prolene suture.  This anastomosis was tested and found to be adequate.  The graft was flushed with heparinized saline and reoccluded.  Next the brachial artery was exposed through the same incision and was occluded proximally and  distally and was opened with an 11 blade and extended longitudinally with Potts scissors.  The graft was cut to the appropriate length and was spatulated and sewn end-to-side to the artery with a running 6-0 Prolene suture.  Clamps were removed and excellent thrill was noted.  The wounds were irrigated with saline.  Hemostasis tended to cautery.  The wounds were closed with 3-0 Vicryl in the subcutaneous and subcuticular tissue.  The patient did maintain a radial pulse.  He was transferred to the recovery room in stable condition   Rosetta Posner, M.D., [facility] 02/21/2021 9:14 AM  Note: Portions of this report may have been transcribed using voice recognition software.  Every effort has been made to ensure accuracy; however, inadvertent computerized transcription errors may still be present.

## 2021-02-21 NOTE — Telephone Encounter (Signed)
Communication per Dr. Donnetta Hutching -Mr. Singleterry currently has a hemodialysis catheter. This was probably placed by interventional radiology in [place]. Told his family member that I would be happy to remove this in the [place] once his new graft is being used. I will try to communicate this to the dialysis unit as well.

## 2021-02-21 NOTE — Transfer of Care (Signed)
Immediate Anesthesia Transfer of Care Note  Patient: Steve [name]  Procedure(s) Performed: INSERTION OF ARTERIOVENOUS GORE-TEX GRAFT LEFT ARM (Left Arm Upper)  Patient Location: PACU  Anesthesia Type:General and Regional  Level of Consciousness: awake and oriented  Airway & Oxygen Therapy: Patient Spontanous Breathing  Post-op Assessment: Report given to RN and Post -op Vital signs reviewed and stable  Post vital signs: Reviewed and stable  Last Vitals:  Vitals Value Taken Time  BP 152/62 02/21/21 0913  Temp    Pulse 71 02/21/21 0915  Resp 11 02/21/21 0915  SpO2 100 % 02/21/21 0915  Vitals shown include unvalidated device data.  Last Pain:  Vitals:   02/21/21 0634  TempSrc: Oral  PainSc: 0-No pain      Patients Stated Pain Goal: 5 (74/60/02 9847)  Complications: No complications documented.

## 2021-02-22 ENCOUNTER — Encounter (HOSPITAL_COMMUNITY): Payer: Self-pay | Admitting: Vascular Surgery

## 2021-02-22 LAB — GLUCOSE, CAPILLARY: Glucose-Capillary: 112 mg/dL — ABNORMAL HIGH (ref 70–99)

## 2021-02-23 DIAGNOSIS — N186 End stage renal disease: Secondary | ICD-10-CM | POA: Diagnosis not present

## 2021-02-23 DIAGNOSIS — Z992 Dependence on renal dialysis: Secondary | ICD-10-CM | POA: Diagnosis not present

## 2021-02-23 DIAGNOSIS — Z23 Encounter for immunization: Secondary | ICD-10-CM | POA: Diagnosis not present

## 2021-02-26 DIAGNOSIS — Z23 Encounter for immunization: Secondary | ICD-10-CM | POA: Diagnosis not present

## 2021-02-26 DIAGNOSIS — N186 End stage renal disease: Secondary | ICD-10-CM | POA: Diagnosis not present

## 2021-02-26 DIAGNOSIS — Z992 Dependence on renal dialysis: Secondary | ICD-10-CM | POA: Diagnosis not present

## 2021-02-28 DIAGNOSIS — Z23 Encounter for immunization: Secondary | ICD-10-CM | POA: Diagnosis not present

## 2021-02-28 DIAGNOSIS — Z992 Dependence on renal dialysis: Secondary | ICD-10-CM | POA: Diagnosis not present

## 2021-02-28 DIAGNOSIS — N186 End stage renal disease: Secondary | ICD-10-CM | POA: Diagnosis not present

## 2021-02-28 DIAGNOSIS — N2581 Secondary hyperparathyroidism of renal origin: Secondary | ICD-10-CM | POA: Diagnosis not present

## 2021-03-02 DIAGNOSIS — N186 End stage renal disease: Secondary | ICD-10-CM | POA: Diagnosis not present

## 2021-03-02 DIAGNOSIS — Z23 Encounter for immunization: Secondary | ICD-10-CM | POA: Diagnosis not present

## 2021-03-02 DIAGNOSIS — Z992 Dependence on renal dialysis: Secondary | ICD-10-CM | POA: Diagnosis not present

## 2021-03-05 DIAGNOSIS — N186 End stage renal disease: Secondary | ICD-10-CM | POA: Diagnosis not present

## 2021-03-05 DIAGNOSIS — Z992 Dependence on renal dialysis: Secondary | ICD-10-CM | POA: Diagnosis not present

## 2021-03-05 DIAGNOSIS — Z23 Encounter for immunization: Secondary | ICD-10-CM | POA: Diagnosis not present

## 2021-03-05 DIAGNOSIS — D509 Iron deficiency anemia, unspecified: Secondary | ICD-10-CM | POA: Diagnosis not present

## 2021-03-07 DIAGNOSIS — Z23 Encounter for immunization: Secondary | ICD-10-CM | POA: Diagnosis not present

## 2021-03-07 DIAGNOSIS — N186 End stage renal disease: Secondary | ICD-10-CM | POA: Diagnosis not present

## 2021-03-07 DIAGNOSIS — Z992 Dependence on renal dialysis: Secondary | ICD-10-CM | POA: Diagnosis not present

## 2021-03-09 DIAGNOSIS — Z992 Dependence on renal dialysis: Secondary | ICD-10-CM | POA: Diagnosis not present

## 2021-03-09 DIAGNOSIS — N186 End stage renal disease: Secondary | ICD-10-CM | POA: Diagnosis not present

## 2021-03-09 DIAGNOSIS — Z23 Encounter for immunization: Secondary | ICD-10-CM | POA: Diagnosis not present

## 2021-03-12 DIAGNOSIS — N186 End stage renal disease: Secondary | ICD-10-CM | POA: Diagnosis not present

## 2021-03-12 DIAGNOSIS — Z23 Encounter for immunization: Secondary | ICD-10-CM | POA: Diagnosis not present

## 2021-03-12 DIAGNOSIS — Z992 Dependence on renal dialysis: Secondary | ICD-10-CM | POA: Diagnosis not present

## 2021-03-14 DIAGNOSIS — N186 End stage renal disease: Secondary | ICD-10-CM | POA: Diagnosis not present

## 2021-03-14 DIAGNOSIS — Z992 Dependence on renal dialysis: Secondary | ICD-10-CM | POA: Diagnosis not present

## 2021-03-16 DIAGNOSIS — N186 End stage renal disease: Secondary | ICD-10-CM | POA: Diagnosis not present

## 2021-03-16 DIAGNOSIS — Z992 Dependence on renal dialysis: Secondary | ICD-10-CM | POA: Diagnosis not present

## 2021-03-19 DIAGNOSIS — Z992 Dependence on renal dialysis: Secondary | ICD-10-CM | POA: Diagnosis not present

## 2021-03-19 DIAGNOSIS — N186 End stage renal disease: Secondary | ICD-10-CM | POA: Diagnosis not present

## 2021-03-21 DIAGNOSIS — E559 Vitamin D deficiency, unspecified: Secondary | ICD-10-CM | POA: Diagnosis not present

## 2021-03-21 DIAGNOSIS — Z992 Dependence on renal dialysis: Secondary | ICD-10-CM | POA: Diagnosis not present

## 2021-03-21 DIAGNOSIS — N186 End stage renal disease: Secondary | ICD-10-CM | POA: Diagnosis not present

## 2021-03-23 DIAGNOSIS — N186 End stage renal disease: Secondary | ICD-10-CM | POA: Diagnosis not present

## 2021-03-23 DIAGNOSIS — Z992 Dependence on renal dialysis: Secondary | ICD-10-CM | POA: Diagnosis not present

## 2021-03-26 DIAGNOSIS — N186 End stage renal disease: Secondary | ICD-10-CM | POA: Diagnosis not present

## 2021-03-26 DIAGNOSIS — Z992 Dependence on renal dialysis: Secondary | ICD-10-CM | POA: Diagnosis not present

## 2021-03-28 DIAGNOSIS — N186 End stage renal disease: Secondary | ICD-10-CM | POA: Diagnosis not present

## 2021-03-28 DIAGNOSIS — Z992 Dependence on renal dialysis: Secondary | ICD-10-CM | POA: Diagnosis not present

## 2021-03-30 DIAGNOSIS — Z992 Dependence on renal dialysis: Secondary | ICD-10-CM | POA: Diagnosis not present

## 2021-03-30 DIAGNOSIS — N186 End stage renal disease: Secondary | ICD-10-CM | POA: Diagnosis not present

## 2021-04-02 DIAGNOSIS — N186 End stage renal disease: Secondary | ICD-10-CM | POA: Diagnosis not present

## 2021-04-02 DIAGNOSIS — Z992 Dependence on renal dialysis: Secondary | ICD-10-CM | POA: Diagnosis not present

## 2021-04-04 DIAGNOSIS — Z992 Dependence on renal dialysis: Secondary | ICD-10-CM | POA: Diagnosis not present

## 2021-04-04 DIAGNOSIS — N186 End stage renal disease: Secondary | ICD-10-CM | POA: Diagnosis not present

## 2021-04-05 DIAGNOSIS — N186 End stage renal disease: Secondary | ICD-10-CM | POA: Diagnosis not present

## 2021-04-05 DIAGNOSIS — Z452 Encounter for adjustment and management of vascular access device: Secondary | ICD-10-CM | POA: Diagnosis not present

## 2021-04-05 DIAGNOSIS — Z992 Dependence on renal dialysis: Secondary | ICD-10-CM | POA: Diagnosis not present

## 2021-04-06 DIAGNOSIS — Z992 Dependence on renal dialysis: Secondary | ICD-10-CM | POA: Diagnosis not present

## 2021-04-06 DIAGNOSIS — N186 End stage renal disease: Secondary | ICD-10-CM | POA: Diagnosis not present

## 2021-04-09 DIAGNOSIS — E119 Type 2 diabetes mellitus without complications: Secondary | ICD-10-CM | POA: Diagnosis not present

## 2021-04-09 DIAGNOSIS — Z992 Dependence on renal dialysis: Secondary | ICD-10-CM | POA: Diagnosis not present

## 2021-04-09 DIAGNOSIS — D509 Iron deficiency anemia, unspecified: Secondary | ICD-10-CM | POA: Diagnosis not present

## 2021-04-09 DIAGNOSIS — N186 End stage renal disease: Secondary | ICD-10-CM | POA: Diagnosis not present

## 2021-04-11 DIAGNOSIS — Z992 Dependence on renal dialysis: Secondary | ICD-10-CM | POA: Diagnosis not present

## 2021-04-11 DIAGNOSIS — N186 End stage renal disease: Secondary | ICD-10-CM | POA: Diagnosis not present

## 2021-04-13 DIAGNOSIS — N186 End stage renal disease: Secondary | ICD-10-CM | POA: Diagnosis not present

## 2021-04-13 DIAGNOSIS — Z992 Dependence on renal dialysis: Secondary | ICD-10-CM | POA: Diagnosis not present

## 2021-04-16 DIAGNOSIS — Z992 Dependence on renal dialysis: Secondary | ICD-10-CM | POA: Diagnosis not present

## 2021-04-16 DIAGNOSIS — N186 End stage renal disease: Secondary | ICD-10-CM | POA: Diagnosis not present

## 2021-04-17 ENCOUNTER — Other Ambulatory Visit: Payer: Medicare Other

## 2021-04-17 DIAGNOSIS — E1122 Type 2 diabetes mellitus with diabetic chronic kidney disease: Secondary | ICD-10-CM | POA: Diagnosis not present

## 2021-04-17 DIAGNOSIS — E872 Acidosis: Secondary | ICD-10-CM | POA: Diagnosis not present

## 2021-04-17 DIAGNOSIS — D631 Anemia in chronic kidney disease: Secondary | ICD-10-CM | POA: Diagnosis not present

## 2021-04-17 DIAGNOSIS — N189 Chronic kidney disease, unspecified: Secondary | ICD-10-CM | POA: Diagnosis not present

## 2021-04-17 DIAGNOSIS — N185 Chronic kidney disease, stage 5: Secondary | ICD-10-CM | POA: Diagnosis not present

## 2021-04-18 DIAGNOSIS — N186 End stage renal disease: Secondary | ICD-10-CM | POA: Diagnosis not present

## 2021-04-18 DIAGNOSIS — Z992 Dependence on renal dialysis: Secondary | ICD-10-CM | POA: Diagnosis not present

## 2021-04-20 DIAGNOSIS — Z992 Dependence on renal dialysis: Secondary | ICD-10-CM | POA: Diagnosis not present

## 2021-04-20 DIAGNOSIS — N186 End stage renal disease: Secondary | ICD-10-CM | POA: Diagnosis not present

## 2021-04-23 DIAGNOSIS — N186 End stage renal disease: Secondary | ICD-10-CM | POA: Diagnosis not present

## 2021-04-23 DIAGNOSIS — Z992 Dependence on renal dialysis: Secondary | ICD-10-CM | POA: Diagnosis not present

## 2021-04-25 DIAGNOSIS — Z992 Dependence on renal dialysis: Secondary | ICD-10-CM | POA: Diagnosis not present

## 2021-04-25 DIAGNOSIS — N186 End stage renal disease: Secondary | ICD-10-CM | POA: Diagnosis not present

## 2021-04-27 DIAGNOSIS — N186 End stage renal disease: Secondary | ICD-10-CM | POA: Diagnosis not present

## 2021-04-27 DIAGNOSIS — Z992 Dependence on renal dialysis: Secondary | ICD-10-CM | POA: Diagnosis not present

## 2021-04-29 NOTE — Progress Notes (Signed)
History of Present Illness: This man is here today for follow-up.  He has a history of prostate cancer.  He also has a history of BPH with incomplete emptying.  12/18/2013--transrectal ultrasound and biopsy.  PSA 11, prostate volume 18, PSA density 0.63.  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  He completed EBRT.  Since that time he has had no evidence of recurrence.  7.18.2022: No recent gross hematuria.  He states that Dr. Legrand Rams checked his PSA recently and it was undetectable.  He is on dialysis but still makes urine.  He usually feels like he empties well. Past Medical History:  Diagnosis Date   Anemia    Arthritis    CKD (chronic kidney disease) stage 4, GFR 15-29 ml/min (HCC)    Essential hypertension    GERD (gastroesophageal reflux disease)    Hyperlipidemia    Prostate cancer ([place])    took radiation   Renal insufficiency    Type 2 diabetes mellitus ([place])     Past Surgical History:  Procedure Laterality Date   ANTERIOR CERVICAL DECOMP/DISCECTOMY FUSION N/A 08/23/2020   Procedure: Cervical 3-4 Anterior cervical decompression/discectomy/fusion;  Surgeon: Erline Levine, MD;  Location: [facility];  Service: Neurosurgery;  Laterality: N/A;  3C/RM 21 to follow   AV FISTULA PLACEMENT Left 02/21/2021   Procedure: INSERTION OF ARTERIOVENOUS GORE-TEX GRAFT LEFT ARM;  Surgeon: Rosetta Posner, MD;  [facility];  Service: Vascular;  Laterality: Left;   CATARACT EXTRACTION W/PHACO Right 04/28/2016   Procedure: CATARACT EXTRACTION PHACO AND INTRAOCULAR LENS PLACEMENT ([place]);  Surgeon: Williams Che, MD;  Location: [facility];  Service: Ophthalmology;  Laterality: Right;  CDE: 4.28   COLONOSCOPY     about 2 years in [place]     pt. denies   IR FLUORO GUIDE CV LINE LEFT  12/20/2020   IR US GUIDE VASC ACCESS LEFT  12/20/2020    Home Medications:  Allergies as of 04/30/2021   No Known Allergies      Medication List        Accurate as of April 29, 2021  9:58  AM. If you have any questions, ask your nurse or doctor.          Accu-Chek Aviva Plus test strip Generic drug: glucose blood   OneTouch Verio test strip Generic drug: glucose blood 1 each 2 (two) times daily.   Accu-Chek Softclix Lancet Dev Kit   Accu-Chek Softclix Lancets lancets   aspirin 81 MG tablet Take 81 mg by mouth daily.   gabapentin 300 MG capsule Commonly known as: NEURONTIN Take 300 mg by mouth in the morning.   hydrALAZINE 50 MG tablet Commonly known as: APRESOLINE Take 50 mg by mouth 3 (three) times daily.   linagliptin 5 MG Tabs tablet Commonly known as: TRADJENTA Take 5 mg by mouth daily.   metoprolol tartrate 25 MG tablet Commonly known as: LOPRESSOR Take 1 tablet (25 mg total) by mouth 2 (two) times daily.   multivitamin with minerals Tabs tablet Take 1 tablet by mouth daily.   NIFEdipine 90 MG 24 hr tablet Commonly known as: PROCARDIA XL/NIFEDICAL-XL TAKE (1) TABLET BY MOUTH ONCE DAILY. What changed: See the new instructions.   oxyCODONE-acetaminophen 5-325 MG tablet Commonly known as: Percocet Take 1 tablet by mouth every 6 (six) hours as needed for severe pain.   sodium bicarbonate 650 MG tablet Take 650 mg by mouth 2 (two) times daily.   traMADol 50 MG tablet Commonly known  as: ULTRAM Take 1 tablet (50 mg total) by mouth every 12 (twelve) hours as needed for severe pain.   zolpidem 5 MG tablet Commonly known as: AMBIEN Take 5 mg by mouth at bedtime as needed for sleep.        Allergies: No Known Allergies  Family History  Problem Relation Age of Onset   Heart disease Mother        cause of death   Diabetes Sister    Colon cancer Neg Hx     Social History:  reports that he quit smoking about 33 years ago. His smoking use included pipe and cigars. He started smoking about 66 years ago. He has a 50.00 pack-year smoking history. He has never used smokeless tobacco. He reports that he does not drink alcohol and does not use  drugs.  ROS: A complete review of systems was performed.  All systems are negative except for pertinent findings as noted.  Physical Exam:  Vital signs in last 24 hours: There were no vitals taken for this visit. Constitutional:  Alert and oriented, No acute distress Cardiovascular: Regular rate  Respiratory: Normal respiratory effort GI: Abdomen is soft, nontender, nondistended, no abdominal masses. No CVAT.  Genitourinary: Normal male phallus, testes are descended bilaterally and non-tender and without masses, scrotum is normal in appearance without lesions or masses, perineum is normal on inspection. Lymphatic: No lymphadenopathy Neurologic: Grossly intact, no focal deficits Psychiatric: Normal mood and affect  I have reviewed prior pt notes  I have reviewed urinalysis results  I have independently reviewed prior imaging--postvoid residual today by bladder scan was 84 mL.  I have reviewed prior PSA results--last PSA I have was October 2020 and was 0.1.    Impression/Assessment:  1.  History of prostate cancer, GG 1, status post external beam radiotherapy in 2015 with no evidence of recurrence  2.  History of BPH, he seems to be emptying well  Plan:  1.  I will get recent PSA data from Dr. Legrand Rams  2.  I will see back on an annual basis at patient request.

## 2021-04-30 ENCOUNTER — Other Ambulatory Visit: Payer: Self-pay

## 2021-04-30 ENCOUNTER — Ambulatory Visit (INDEPENDENT_AMBULATORY_CARE_PROVIDER_SITE_OTHER): Payer: Medicare Other | Admitting: Urology

## 2021-04-30 VITALS — BP 165/59 | HR 86

## 2021-04-30 DIAGNOSIS — R339 Retention of urine, unspecified: Secondary | ICD-10-CM

## 2021-04-30 DIAGNOSIS — C61 Malignant neoplasm of prostate: Secondary | ICD-10-CM | POA: Diagnosis not present

## 2021-04-30 DIAGNOSIS — N186 End stage renal disease: Secondary | ICD-10-CM | POA: Diagnosis not present

## 2021-04-30 DIAGNOSIS — Z992 Dependence on renal dialysis: Secondary | ICD-10-CM | POA: Diagnosis not present

## 2021-04-30 LAB — URINALYSIS, ROUTINE W REFLEX MICROSCOPIC
Bilirubin, UA: NEGATIVE
Glucose, UA: NEGATIVE
Ketones, UA: NEGATIVE
Leukocytes,UA: NEGATIVE
Nitrite, UA: NEGATIVE
RBC, UA: NEGATIVE
Specific Gravity, UA: 1.015 (ref 1.005–1.030)
Urobilinogen, Ur: 0.2 mg/dL (ref 0.2–1.0)
pH, UA: 7.5 (ref 5.0–7.5)

## 2021-04-30 LAB — MICROSCOPIC EXAMINATION
Bacteria, UA: NONE SEEN
Epithelial Cells (non renal): NONE SEEN /hpf (ref 0–10)
RBC, Urine: NONE SEEN /hpf (ref 0–2)
Renal Epithel, UA: NONE SEEN /hpf
WBC, UA: NONE SEEN /hpf (ref 0–5)

## 2021-04-30 NOTE — Progress Notes (Signed)

## 2021-05-02 DIAGNOSIS — Z992 Dependence on renal dialysis: Secondary | ICD-10-CM | POA: Diagnosis not present

## 2021-05-02 DIAGNOSIS — N186 End stage renal disease: Secondary | ICD-10-CM | POA: Diagnosis not present

## 2021-05-04 DIAGNOSIS — Z992 Dependence on renal dialysis: Secondary | ICD-10-CM | POA: Diagnosis not present

## 2021-05-04 DIAGNOSIS — N186 End stage renal disease: Secondary | ICD-10-CM | POA: Diagnosis not present

## 2021-05-07 DIAGNOSIS — Z992 Dependence on renal dialysis: Secondary | ICD-10-CM | POA: Diagnosis not present

## 2021-05-07 DIAGNOSIS — N186 End stage renal disease: Secondary | ICD-10-CM | POA: Diagnosis not present

## 2021-05-09 DIAGNOSIS — N186 End stage renal disease: Secondary | ICD-10-CM | POA: Diagnosis not present

## 2021-05-09 DIAGNOSIS — Z992 Dependence on renal dialysis: Secondary | ICD-10-CM | POA: Diagnosis not present

## 2021-05-11 DIAGNOSIS — N186 End stage renal disease: Secondary | ICD-10-CM | POA: Diagnosis not present

## 2021-05-11 DIAGNOSIS — Z992 Dependence on renal dialysis: Secondary | ICD-10-CM | POA: Diagnosis not present

## 2021-05-12 DIAGNOSIS — N186 End stage renal disease: Secondary | ICD-10-CM | POA: Diagnosis not present

## 2021-05-12 DIAGNOSIS — Z992 Dependence on renal dialysis: Secondary | ICD-10-CM | POA: Diagnosis not present

## 2021-05-14 DIAGNOSIS — Z992 Dependence on renal dialysis: Secondary | ICD-10-CM | POA: Diagnosis not present

## 2021-05-14 DIAGNOSIS — N186 End stage renal disease: Secondary | ICD-10-CM | POA: Diagnosis not present

## 2021-05-16 DIAGNOSIS — Z992 Dependence on renal dialysis: Secondary | ICD-10-CM | POA: Diagnosis not present

## 2021-05-16 DIAGNOSIS — N186 End stage renal disease: Secondary | ICD-10-CM | POA: Diagnosis not present

## 2021-05-18 DIAGNOSIS — Z992 Dependence on renal dialysis: Secondary | ICD-10-CM | POA: Diagnosis not present

## 2021-05-18 DIAGNOSIS — N186 End stage renal disease: Secondary | ICD-10-CM | POA: Diagnosis not present

## 2021-05-21 DIAGNOSIS — Z992 Dependence on renal dialysis: Secondary | ICD-10-CM | POA: Diagnosis not present

## 2021-05-21 DIAGNOSIS — N186 End stage renal disease: Secondary | ICD-10-CM | POA: Diagnosis not present

## 2021-05-22 DIAGNOSIS — I10 Essential (primary) hypertension: Secondary | ICD-10-CM | POA: Diagnosis not present

## 2021-05-22 DIAGNOSIS — E1142 Type 2 diabetes mellitus with diabetic polyneuropathy: Secondary | ICD-10-CM | POA: Diagnosis not present

## 2021-05-22 DIAGNOSIS — E1342 Other specified diabetes mellitus with diabetic polyneuropathy: Secondary | ICD-10-CM | POA: Diagnosis not present

## 2021-05-22 DIAGNOSIS — Z992 Dependence on renal dialysis: Secondary | ICD-10-CM | POA: Diagnosis not present

## 2021-05-23 DIAGNOSIS — N186 End stage renal disease: Secondary | ICD-10-CM | POA: Diagnosis not present

## 2021-05-23 DIAGNOSIS — Z992 Dependence on renal dialysis: Secondary | ICD-10-CM | POA: Diagnosis not present

## 2021-05-25 DIAGNOSIS — Z992 Dependence on renal dialysis: Secondary | ICD-10-CM | POA: Diagnosis not present

## 2021-05-25 DIAGNOSIS — N186 End stage renal disease: Secondary | ICD-10-CM | POA: Diagnosis not present

## 2021-05-28 DIAGNOSIS — N186 End stage renal disease: Secondary | ICD-10-CM | POA: Diagnosis not present

## 2021-05-28 DIAGNOSIS — Z992 Dependence on renal dialysis: Secondary | ICD-10-CM | POA: Diagnosis not present

## 2021-05-30 DIAGNOSIS — N186 End stage renal disease: Secondary | ICD-10-CM | POA: Diagnosis not present

## 2021-05-30 DIAGNOSIS — Z992 Dependence on renal dialysis: Secondary | ICD-10-CM | POA: Diagnosis not present

## 2021-06-01 DIAGNOSIS — N186 End stage renal disease: Secondary | ICD-10-CM | POA: Diagnosis not present

## 2021-06-01 DIAGNOSIS — Z992 Dependence on renal dialysis: Secondary | ICD-10-CM | POA: Diagnosis not present

## 2021-06-03 DIAGNOSIS — E119 Type 2 diabetes mellitus without complications: Secondary | ICD-10-CM | POA: Diagnosis not present

## 2021-06-03 DIAGNOSIS — Z7984 Long term (current) use of oral hypoglycemic drugs: Secondary | ICD-10-CM | POA: Diagnosis not present

## 2021-06-03 DIAGNOSIS — H524 Presbyopia: Secondary | ICD-10-CM | POA: Diagnosis not present

## 2021-06-03 DIAGNOSIS — Z961 Presence of intraocular lens: Secondary | ICD-10-CM | POA: Diagnosis not present

## 2021-06-03 DIAGNOSIS — H2512 Age-related nuclear cataract, left eye: Secondary | ICD-10-CM | POA: Diagnosis not present

## 2021-06-04 DIAGNOSIS — Z992 Dependence on renal dialysis: Secondary | ICD-10-CM | POA: Diagnosis not present

## 2021-06-04 DIAGNOSIS — N186 End stage renal disease: Secondary | ICD-10-CM | POA: Diagnosis not present

## 2021-06-06 DIAGNOSIS — N186 End stage renal disease: Secondary | ICD-10-CM | POA: Diagnosis not present

## 2021-06-06 DIAGNOSIS — Z992 Dependence on renal dialysis: Secondary | ICD-10-CM | POA: Diagnosis not present

## 2021-06-08 DIAGNOSIS — N186 End stage renal disease: Secondary | ICD-10-CM | POA: Diagnosis not present

## 2021-06-08 DIAGNOSIS — Z992 Dependence on renal dialysis: Secondary | ICD-10-CM | POA: Diagnosis not present

## 2021-06-11 DIAGNOSIS — Z992 Dependence on renal dialysis: Secondary | ICD-10-CM | POA: Diagnosis not present

## 2021-06-11 DIAGNOSIS — N186 End stage renal disease: Secondary | ICD-10-CM | POA: Diagnosis not present

## 2021-06-12 DIAGNOSIS — Z992 Dependence on renal dialysis: Secondary | ICD-10-CM | POA: Diagnosis not present

## 2021-06-12 DIAGNOSIS — N186 End stage renal disease: Secondary | ICD-10-CM | POA: Diagnosis not present

## 2021-06-13 DIAGNOSIS — N186 End stage renal disease: Secondary | ICD-10-CM | POA: Diagnosis not present

## 2021-06-13 DIAGNOSIS — Z992 Dependence on renal dialysis: Secondary | ICD-10-CM | POA: Diagnosis not present

## 2021-06-13 DIAGNOSIS — Z23 Encounter for immunization: Secondary | ICD-10-CM | POA: Diagnosis not present

## 2021-06-15 DIAGNOSIS — D689 Coagulation defect, unspecified: Secondary | ICD-10-CM | POA: Diagnosis not present

## 2021-06-15 DIAGNOSIS — Z992 Dependence on renal dialysis: Secondary | ICD-10-CM | POA: Diagnosis not present

## 2021-06-15 DIAGNOSIS — N186 End stage renal disease: Secondary | ICD-10-CM | POA: Diagnosis not present

## 2021-06-15 DIAGNOSIS — N2581 Secondary hyperparathyroidism of renal origin: Secondary | ICD-10-CM | POA: Diagnosis not present

## 2021-06-18 DIAGNOSIS — N186 End stage renal disease: Secondary | ICD-10-CM | POA: Diagnosis not present

## 2021-06-18 DIAGNOSIS — Z992 Dependence on renal dialysis: Secondary | ICD-10-CM | POA: Diagnosis not present

## 2021-06-18 DIAGNOSIS — N2581 Secondary hyperparathyroidism of renal origin: Secondary | ICD-10-CM | POA: Diagnosis not present

## 2021-06-18 DIAGNOSIS — D689 Coagulation defect, unspecified: Secondary | ICD-10-CM | POA: Diagnosis not present

## 2021-06-20 DIAGNOSIS — N2581 Secondary hyperparathyroidism of renal origin: Secondary | ICD-10-CM | POA: Diagnosis not present

## 2021-06-20 DIAGNOSIS — Z992 Dependence on renal dialysis: Secondary | ICD-10-CM | POA: Diagnosis not present

## 2021-06-20 DIAGNOSIS — N186 End stage renal disease: Secondary | ICD-10-CM | POA: Diagnosis not present

## 2021-06-20 DIAGNOSIS — D689 Coagulation defect, unspecified: Secondary | ICD-10-CM | POA: Diagnosis not present

## 2021-06-22 DIAGNOSIS — N186 End stage renal disease: Secondary | ICD-10-CM | POA: Diagnosis not present

## 2021-06-22 DIAGNOSIS — Z992 Dependence on renal dialysis: Secondary | ICD-10-CM | POA: Diagnosis not present

## 2021-06-22 DIAGNOSIS — D689 Coagulation defect, unspecified: Secondary | ICD-10-CM | POA: Diagnosis not present

## 2021-06-22 DIAGNOSIS — N2581 Secondary hyperparathyroidism of renal origin: Secondary | ICD-10-CM | POA: Diagnosis not present

## 2021-06-25 DIAGNOSIS — Z23 Encounter for immunization: Secondary | ICD-10-CM | POA: Diagnosis not present

## 2021-06-25 DIAGNOSIS — N186 End stage renal disease: Secondary | ICD-10-CM | POA: Diagnosis not present

## 2021-06-25 DIAGNOSIS — Z992 Dependence on renal dialysis: Secondary | ICD-10-CM | POA: Diagnosis not present

## 2021-06-27 ENCOUNTER — Encounter (HOSPITAL_COMMUNITY): Payer: Self-pay | Admitting: *Deleted

## 2021-06-27 ENCOUNTER — Other Ambulatory Visit: Payer: Self-pay

## 2021-06-27 ENCOUNTER — Emergency Department (HOSPITAL_COMMUNITY)
Admission: EM | Admit: 2021-06-27 | Discharge: 2021-06-27 | Disposition: A | Discharge status: Home / Self Care | Payer: Medicare Other | Attending: Emergency Medicine | Admitting: Emergency Medicine

## 2021-06-27 DIAGNOSIS — Z79899 Other long term (current) drug therapy: Secondary | ICD-10-CM | POA: Diagnosis not present

## 2021-06-27 DIAGNOSIS — Z8546 Personal history of malignant neoplasm of prostate: Secondary | ICD-10-CM | POA: Diagnosis not present

## 2021-06-27 DIAGNOSIS — Z794 Long term (current) use of insulin: Secondary | ICD-10-CM | POA: Diagnosis not present

## 2021-06-27 DIAGNOSIS — D631 Anemia in chronic kidney disease: Secondary | ICD-10-CM | POA: Diagnosis not present

## 2021-06-27 DIAGNOSIS — Z87891 Personal history of nicotine dependence: Secondary | ICD-10-CM | POA: Insufficient documentation

## 2021-06-27 DIAGNOSIS — E1121 Type 2 diabetes mellitus with diabetic nephropathy: Secondary | ICD-10-CM | POA: Insufficient documentation

## 2021-06-27 DIAGNOSIS — N184 Chronic kidney disease, stage 4 (severe): Secondary | ICD-10-CM | POA: Diagnosis not present

## 2021-06-27 DIAGNOSIS — I129 Hypertensive chronic kidney disease with stage 1 through stage 4 chronic kidney disease, or unspecified chronic kidney disease: Secondary | ICD-10-CM | POA: Diagnosis not present

## 2021-06-27 DIAGNOSIS — N4889 Other specified disorders of penis: Secondary | ICD-10-CM | POA: Diagnosis not present

## 2021-06-27 DIAGNOSIS — Z992 Dependence on renal dialysis: Secondary | ICD-10-CM | POA: Diagnosis not present

## 2021-06-27 DIAGNOSIS — E1122 Type 2 diabetes mellitus with diabetic chronic kidney disease: Secondary | ICD-10-CM | POA: Insufficient documentation

## 2021-06-27 DIAGNOSIS — Z23 Encounter for immunization: Secondary | ICD-10-CM | POA: Diagnosis not present

## 2021-06-27 DIAGNOSIS — N186 End stage renal disease: Secondary | ICD-10-CM | POA: Diagnosis not present

## 2021-06-27 NOTE — ED Triage Notes (Signed)
Pain in groin area for 4 weeks, concerned it may be prostate issues states pain is intermittent

## 2021-06-27 NOTE — Discharge Instructions (Addendum)
Continue dialysis as planned.  Examination of the groin area without any acute findings.  No discharge.  Make an appointment to follow-up with urology to see if they have an explanation for the intermittent pain in the penis area.  Return for any new or worse symptoms.

## 2021-06-27 NOTE — ED Notes (Signed)
Pt states he has a hx of prostate CA and has had pain in the groin area for the last 4 weeks. Pt states the pain is intermittent. The pt has a left arm restriction due to a fistula. Pt denies urinary symptoms.

## 2021-06-27 NOTE — ED Provider Notes (Signed)
[facility] EMERGENCY DEPARTMENT Provider Note   CSN: [id number] Arrival date & time: 06/27/21  1712     History Chief Complaint  Patient presents with   Groin Pain    Steve [name] is a 85 y.o. male.  Patient with a complaint of intermittent pain to the penis no discharge no testicular pain no groin pain.  This is been going on for about 4 weeks.  Patient has been on dialysis now for about 6 months.  He goes Tuesday Thursdays Saturdays.  He was dialyzed fully today.  Patient states he has had no sexual intercourse in 2 years.  Patient states he occasionally makes a little bit of urine.  No fevers no back pain.      Past Medical History:  Diagnosis Date   Anemia    Arthritis    CKD (chronic kidney disease) stage 4, GFR 15-29 ml/min (HCC)    Essential hypertension    GERD (gastroesophageal reflux disease)    Hyperlipidemia    Prostate cancer ([place])    took radiation   Renal insufficiency    Type 2 diabetes mellitus ([place])     Patient Active Problem List   Diagnosis Date Noted   Status post cervical discectomy 08/23/2020   Cervical myelopathy ([place]) 08/23/2020   Atypical chest pain 04/18/2020   Acute kidney injury superimposed on CKD ([place]) 04/18/2020   Gastroesophageal reflux disease    Fall at home, initial encounter 07/26/2018   Acute left-sided weakness 07/25/2018   Hyperlipidemia 07/25/2018   CKD (chronic kidney disease), stage IV ([place]) 05/13/2018   Chronic kidney disease, stage 4 (severe) ([place]) 05/13/2018   Chest pain 05/12/2018   Elevated troponin 07/02/2017   DM (diabetes mellitus), type 2 with renal complications ([place]) 76/16/0737   IBS (irritable bowel syndrome) 01/28/2017   Prostate cancer ([place]) 07/10/2014   Abnormal ECG 04/12/2013   HTN (hypertension) 04/12/2013   SOB (shortness of breath) 04/12/2013   Type 2 diabetes mellitus with diabetic nephropathy ([place]) 01/11/2013   CHEST PAIN-UNSPECIFIED 06/12/2009    Past Surgical History:  Procedure Laterality  Date   ANTERIOR CERVICAL DECOMP/DISCECTOMY FUSION N/A 08/23/2020   Procedure: Cervical 3-4 Anterior cervical decompression/discectomy/fusion;  Surgeon: Erline Levine, MD;  Location: [facility];  Service: Neurosurgery;  Laterality: N/A;  3C/RM 21 to follow   AV FISTULA PLACEMENT Left 02/21/2021   Procedure: INSERTION OF ARTERIOVENOUS GORE-TEX GRAFT LEFT ARM;  Surgeon: Rosetta Posner, MD;  Location: [facility];  Service: Vascular;  Laterality: Left;   CATARACT EXTRACTION W/PHACO Right 04/28/2016   Procedure: CATARACT EXTRACTION PHACO AND INTRAOCULAR LENS PLACEMENT ([place]);  Surgeon: Williams Che, MD;  Location: [facility];  Service: Ophthalmology;  Laterality: Right;  CDE: 4.28   COLONOSCOPY     about 2 years in [place]     pt. denies   IR FLUORO GUIDE CV LINE LEFT  12/20/2020   IR US GUIDE VASC ACCESS LEFT  12/20/2020       Family History  Problem Relation Age of Onset   Heart disease Mother        cause of death   Diabetes Sister    Colon cancer Neg Hx     Social History   Tobacco Use   Smoking status: Former    Packs/day: 2.00    Years: 25.00    Pack years: 50.00    Types: Pipe, Cigars, Cigarettes    Start date: 11/08/1954    Quit date: 10/14/1987    Years  since quitting: 33.7   Smokeless tobacco: Never   Tobacco comments:    About 2 pipes a day  Vaping Use   Vaping Use: Never used  Substance Use Topics   Alcohol use: No    Alcohol/week: 0.0 standard drinks   Drug use: No    Home Medications Prior to Admission medications   Medication Sig Start Date End Date Taking? Authorizing Provider  Accu-Chek Softclix Lancets lancets  10/27/19   [provider]  aspirin 81 MG tablet Take 81 mg by mouth daily.    [provider]  gabapentin (NEURONTIN) 300 MG capsule Take 300 mg by mouth in the morning. 01/19/15   [provider]  glucose blood (ACCU-CHEK AVIVA PLUS) test strip  11/27/17   [provider]  hydrALAZINE (APRESOLINE) 50 MG tablet Take 50  mg by mouth 3 (three) times daily.    [provider]  Lancets Misc. (ACCU-CHEK SOFTCLIX LANCET DEV) KIT  10/28/19   [provider]  linagliptin (TRADJENTA) 5 MG TABS tablet Take 5 mg by mouth daily.    [provider]  metoprolol tartrate (LOPRESSOR) 25 MG tablet Take 1 tablet (25 mg total) by mouth 2 (two) times daily. 04/18/20   Barton Dubois, MD  Multiple Vitamin (MULTIVITAMIN WITH MINERALS) TABS tablet Take 1 tablet by mouth daily.    [provider]  NIFEdipine (PROCARDIA XL/ADALAT-CC) 90 MG 24 hr tablet TAKE (1) TABLET BY MOUTH ONCE DAILY. Patient taking differently: Take 90 mg by mouth daily before breakfast. 06/17/18   Herminio Commons, MD  [facility] VERIO test strip 1 each 2 (two) times daily. 12/21/19   [provider]  oxyCODONE-acetaminophen (PERCOCET) 5-325 MG tablet Take 1 tablet by mouth every 6 (six) hours as needed for severe pain. 02/21/21   Rosetta Posner, MD  sodium bicarbonate 650 MG tablet Take 650 mg by mouth 2 (two) times daily.  06/24/19   [provider]  traMADol (ULTRAM) 50 MG tablet Take 1 tablet (50 mg total) by mouth every 12 (twelve) hours as needed for severe pain. 04/18/20   Barton Dubois, MD  zolpidem (AMBIEN) 5 MG tablet Take 5 mg by mouth at bedtime as needed for sleep. 07/11/19   [provider]    Allergies    Patient has no known allergies.  Review of Systems   Review of Systems  Constitutional:  Negative for chills and fever.  HENT:  Negative for ear pain and sore throat.   Eyes:  Negative for pain and visual disturbance.  Respiratory:  Negative for cough and shortness of breath.   Cardiovascular:  Negative for chest pain and palpitations.  Gastrointestinal:  Negative for abdominal pain and vomiting.  Genitourinary:  Positive for penile pain. Negative for dysuria, hematuria, scrotal swelling and testicular pain.  Musculoskeletal:  Negative for arthralgias and back pain.  Skin:  Negative for  color change and rash.  Neurological:  Negative for seizures and syncope.  All other systems reviewed and are negative.  Physical Exam Updated Vital Signs BP (!) 163/97   Pulse 85   Temp 98.2 F (36.8 C) (Oral)   Resp 16   SpO2 98%   Physical Exam Vitals and nursing note reviewed.  Constitutional:      Appearance: He is well-developed.  HENT:     Head: Normocephalic and atraumatic.  Eyes:     Conjunctiva/sclera: Conjunctivae normal.  Cardiovascular:     Rate and Rhythm: Normal rate and regular rhythm.  Heart sounds: No murmur heard. Pulmonary:     Effort: Pulmonary effort is normal. No respiratory distress.     Breath sounds: Normal breath sounds.  Abdominal:     Palpations: Abdomen is soft.     Tenderness: There is no abdominal tenderness.  Genitourinary:    Penis: Normal.      Testes: Normal.     Comments: Uncircumcised.  Foreskin pulls back nicely.  No lesions no discharge no tenderness to palpation.  No testicular tenderness no scrotal swelling.  No groin masses no groin lesions.  No adenopathy. Musculoskeletal:        General: No swelling.     Cervical back: Neck supple.     Comments: AV fistula of the left arm.  Good thrill  Skin:    General: Skin is warm and dry.  Neurological:     General: No focal deficit present.     Mental Status: He is alert and oriented to person, place, and time.    ED Results / Procedures / Treatments   Labs (all labs ordered are listed, but only abnormal results are displayed) Labs Reviewed - No data to display  EKG None  Radiology No results found.  Procedures Procedures   Medications Ordered in ED Medications - No data to display  ED Course  I have reviewed the triage vital signs and the nursing notes.  Pertinent labs & imaging results that were available during my care of the patient were reviewed by me and considered in my medical decision making (see chart for details).    MDM Rules/Calculators/A&P                            Patient's GU exam without any significant findings.  No evidence of any discharge no tenderness foreskin retracts easily.  No scrotal tenderness no scrotal swelling no testicular tenderness no groin swelling.  No evidence of hernia.  Patient states that is just intermittent discomfort in the penis area.  Has been ongoing for 4 weeks.  No acute findings on my exam.  Patient's dialysis patient rarely makes urine so checking urinalysis at this point would not be practical.  We will have him follow-up with urology   Final Clinical Impression(s) / ED Diagnoses Final diagnoses:  Pain, penile    Rx / DC Orders ED Discharge Orders     None        Fredia Sorrow, MD 06/27/21 2052

## 2021-06-29 DIAGNOSIS — Z23 Encounter for immunization: Secondary | ICD-10-CM | POA: Diagnosis not present

## 2021-06-29 DIAGNOSIS — N186 End stage renal disease: Secondary | ICD-10-CM | POA: Diagnosis not present

## 2021-06-29 DIAGNOSIS — Z992 Dependence on renal dialysis: Secondary | ICD-10-CM | POA: Diagnosis not present

## 2021-07-02 DIAGNOSIS — N186 End stage renal disease: Secondary | ICD-10-CM | POA: Diagnosis not present

## 2021-07-02 DIAGNOSIS — Z992 Dependence on renal dialysis: Secondary | ICD-10-CM | POA: Diagnosis not present

## 2021-07-02 DIAGNOSIS — Z23 Encounter for immunization: Secondary | ICD-10-CM | POA: Diagnosis not present

## 2021-07-03 DIAGNOSIS — Z23 Encounter for immunization: Secondary | ICD-10-CM | POA: Diagnosis not present

## 2021-07-03 DIAGNOSIS — N186 End stage renal disease: Secondary | ICD-10-CM | POA: Diagnosis not present

## 2021-07-03 DIAGNOSIS — I10 Essential (primary) hypertension: Secondary | ICD-10-CM | POA: Diagnosis not present

## 2021-07-03 DIAGNOSIS — G4700 Insomnia, unspecified: Secondary | ICD-10-CM | POA: Diagnosis not present

## 2021-07-04 DIAGNOSIS — Z992 Dependence on renal dialysis: Secondary | ICD-10-CM | POA: Diagnosis not present

## 2021-07-04 DIAGNOSIS — Z23 Encounter for immunization: Secondary | ICD-10-CM | POA: Diagnosis not present

## 2021-07-04 DIAGNOSIS — N186 End stage renal disease: Secondary | ICD-10-CM | POA: Diagnosis not present

## 2021-07-06 DIAGNOSIS — Z23 Encounter for immunization: Secondary | ICD-10-CM | POA: Diagnosis not present

## 2021-07-06 DIAGNOSIS — Z992 Dependence on renal dialysis: Secondary | ICD-10-CM | POA: Diagnosis not present

## 2021-07-06 DIAGNOSIS — N186 End stage renal disease: Secondary | ICD-10-CM | POA: Diagnosis not present

## 2021-07-08 DIAGNOSIS — Z992 Dependence on renal dialysis: Secondary | ICD-10-CM | POA: Diagnosis not present

## 2021-07-08 DIAGNOSIS — N186 End stage renal disease: Secondary | ICD-10-CM | POA: Diagnosis not present

## 2021-07-08 DIAGNOSIS — Z23 Encounter for immunization: Secondary | ICD-10-CM | POA: Diagnosis not present

## 2021-07-09 ENCOUNTER — Encounter: Payer: Self-pay | Admitting: Urology

## 2021-07-09 ENCOUNTER — Other Ambulatory Visit: Payer: Self-pay

## 2021-07-09 ENCOUNTER — Ambulatory Visit (INDEPENDENT_AMBULATORY_CARE_PROVIDER_SITE_OTHER): Payer: Medicare Other | Admitting: Urology

## 2021-07-09 VITALS — BP 155/61 | HR 96 | Temp 98.7°F | Ht 65.0 in | Wt 169.2 lb

## 2021-07-09 DIAGNOSIS — R339 Retention of urine, unspecified: Secondary | ICD-10-CM | POA: Diagnosis not present

## 2021-07-09 DIAGNOSIS — Z8546 Personal history of malignant neoplasm of prostate: Secondary | ICD-10-CM

## 2021-07-09 LAB — MICROSCOPIC EXAMINATION
Bacteria, UA: NONE SEEN
Epithelial Cells (non renal): NONE SEEN /hpf (ref 0–10)
RBC, Urine: NONE SEEN /hpf (ref 0–2)
Renal Epithel, UA: NONE SEEN /hpf
WBC, UA: NONE SEEN /hpf (ref 0–5)

## 2021-07-09 LAB — URINALYSIS, ROUTINE W REFLEX MICROSCOPIC
Bilirubin, UA: NEGATIVE
Glucose, UA: NEGATIVE
Ketones, UA: NEGATIVE
Leukocytes,UA: NEGATIVE
Nitrite, UA: NEGATIVE
RBC, UA: NEGATIVE
Specific Gravity, UA: 1.015 (ref 1.005–1.030)
Urobilinogen, Ur: 0.2 mg/dL (ref 0.2–1.0)
pH, UA: 8.5 — ABNORMAL HIGH (ref 5.0–7.5)

## 2021-07-09 NOTE — Progress Notes (Signed)
Urological Symptom Review  Patient is experiencing the following symptoms: Weak stream   Review of Systems  Gastrointestinal (upper)  :    Gastrointestinal (lower) : Negative for lower GI symptoms  Constitutional : Negative for symptoms  Skin: Negative for skin symptoms  Eyes: Negative for eye symptoms  Ear/Nose/Throat : Negative for Ear/Nose/Throat symptoms  Hematologic/Lymphatic: Negative for Hematologic/Lymphatic symptoms  Cardiovascular : Negative for cardiovascular symptoms  Respiratory : Negative for respiratory symptoms  Endocrine: Negative for endocrine symptoms  Musculoskeletal: Back pain Joint pain  Neurological: Negative for neurological symptoms  Psychologic: Negative for psychiatric symptoms

## 2021-07-09 NOTE — Progress Notes (Signed)
History of Present Illness: He is here today for follow-up of recent emergency room visit on the 12th of this month.  At that time he was complaining of penile pain.  He had a very cursory evaluation, no urinalysis, and was discharged after he was reassured.  He still has intermittent pain at the base of his penis as well as his perineal area.  He is on dialysis but still produces urine 2-3 times a day.  He has had no gross hematuria or dysuria but his urine has looked slightly cloudy.  He has had no fever or chills.  No abdominal or back pain.  Past Medical History:  Diagnosis Date   Anemia    Arthritis    CKD (chronic kidney disease) stage 4, GFR 15-29 ml/min (HCC)    Essential hypertension    GERD (gastroesophageal reflux disease)    Hyperlipidemia    Prostate cancer ([place])    took radiation   Renal insufficiency    Type 2 diabetes mellitus ([place])     Past Surgical History:  Procedure Laterality Date   ANTERIOR CERVICAL DECOMP/DISCECTOMY FUSION N/A 08/23/2020   Procedure: Cervical 3-4 Anterior cervical decompression/discectomy/fusion;  Surgeon: Erline Levine, MD;  Location: [facility];  Service: Neurosurgery;  Laterality: N/A;  3C/RM 21 to follow   AV FISTULA PLACEMENT Left 02/21/2021   Procedure: INSERTION OF ARTERIOVENOUS GORE-TEX GRAFT LEFT ARM;  Surgeon: Rosetta Posner, MD;  Location: [facility];  Service: Vascular;  Laterality: Left;   CATARACT EXTRACTION W/PHACO Right 04/28/2016   Procedure: CATARACT EXTRACTION PHACO AND INTRAOCULAR LENS PLACEMENT ([place]);  Surgeon: Williams Che, MD;  Location: [facility];  Service: Ophthalmology;  Laterality: Right;  CDE: 4.28   COLONOSCOPY     about 2 years in [place]     pt. denies   IR FLUORO GUIDE CV LINE LEFT  12/20/2020   IR US GUIDE VASC ACCESS LEFT  12/20/2020    Home Medications:  Allergies as of 07/09/2021   No Known Allergies      Medication List        Accurate as of July 09, 2021 10:42 AM. If you have any questions, ask  your nurse or doctor.          Accu-Chek Aviva Plus test strip Generic drug: glucose blood   OneTouch Verio test strip Generic drug: glucose blood 1 each 2 (two) times daily.   Accu-Chek Softclix Lancet Dev Kit   Accu-Chek Softclix Lancets lancets   aspirin 81 MG tablet Take 81 mg by mouth daily.   gabapentin 300 MG capsule Commonly known as: NEURONTIN Take 300 mg by mouth in the morning.   hydrALAZINE 50 MG tablet Commonly known as: APRESOLINE Take 50 mg by mouth 3 (three) times daily.   linagliptin 5 MG Tabs tablet Commonly known as: TRADJENTA Take 5 mg by mouth daily.   metoprolol tartrate 25 MG tablet Commonly known as: LOPRESSOR Take 1 tablet (25 mg total) by mouth 2 (two) times daily.   multivitamin with minerals Tabs tablet Take 1 tablet by mouth daily.   NIFEdipine 90 MG 24 hr tablet Commonly known as: PROCARDIA XL/NIFEDICAL-XL TAKE (1) TABLET BY MOUTH ONCE DAILY. What changed: See the new instructions.   oxyCODONE-acetaminophen 5-325 MG tablet Commonly known as: Percocet Take 1 tablet by mouth every 6 (six) hours as needed for severe pain.   sodium bicarbonate 650 MG tablet Take 650 mg by mouth 2 (two) times daily.   traMADol 50  MG tablet Commonly known as: ULTRAM Take 1 tablet (50 mg total) by mouth every 12 (twelve) hours as needed for severe pain.   zolpidem 5 MG tablet Commonly known as: AMBIEN Take 5 mg by mouth at bedtime as needed for sleep.        Allergies: No Known Allergies  Family History  Problem Relation Age of Onset   Heart disease Mother        cause of death   Diabetes Sister    Colon cancer Neg Hx     Social History:  reports that he quit smoking about 33 years ago. His smoking use included pipe and cigars. He started smoking about 66 years ago. He has a 50.00 pack-year smoking history. He has never used smokeless tobacco. He reports that he does not drink alcohol and does not use drugs.  ROS: A complete review of  systems was performed.  All systems are negative except for pertinent findings as noted.  Physical Exam:  Vital signs in last 24 hours: There were no vitals taken for this visit. Constitutional:  Alert and oriented, No acute distress Cardiovascular: Regular rate  Respiratory: Normal respiratory effort GI: Abdomen is soft, nontender, nondistended, no abdominal masses. No CVAT.  Genitourinary: Normal circumcised male phallus, testes are atrophic, descended bilaterally and non-tender and without masses, scrotum is normal in appearance without lesions or masses, perineum is normal on inspection.  Normal anal sphincter tone.  Prostate smooth, flat, perhaps 20 mL in size Lymphatic: No lymphadenopathy Neurologic: Grossly intact, no focal deficits Psychiatric: Normal mood and affect  I have reviewed prior pt notes  I have reviewed notes from referring/previous physicians  I have reviewed urinalysis results  I have independently reviewed prior imaging--pvr 107 mL.  I have reviewed prior PSA results   Impression/Assessment:  1.  BPH.  He is on 2 tamsulosin a day.  I think that is an unnecessary dosage  2.  Pelvic pain, I do not see any GU cause  Plan:  1.  I reassured him about his exam  2.  Back down to 1 tamsulosin a day  3. at his request, I will have him come back in a year for check

## 2021-07-11 DIAGNOSIS — N186 End stage renal disease: Secondary | ICD-10-CM | POA: Diagnosis not present

## 2021-07-11 DIAGNOSIS — Z992 Dependence on renal dialysis: Secondary | ICD-10-CM | POA: Diagnosis not present

## 2021-07-11 DIAGNOSIS — Z23 Encounter for immunization: Secondary | ICD-10-CM | POA: Diagnosis not present

## 2021-07-12 DIAGNOSIS — N186 End stage renal disease: Secondary | ICD-10-CM | POA: Diagnosis not present

## 2021-07-12 DIAGNOSIS — Z992 Dependence on renal dialysis: Secondary | ICD-10-CM | POA: Diagnosis not present

## 2021-07-13 DIAGNOSIS — N186 End stage renal disease: Secondary | ICD-10-CM | POA: Diagnosis not present

## 2021-07-13 DIAGNOSIS — Z992 Dependence on renal dialysis: Secondary | ICD-10-CM | POA: Diagnosis not present

## 2021-07-16 DIAGNOSIS — D509 Iron deficiency anemia, unspecified: Secondary | ICD-10-CM | POA: Diagnosis not present

## 2021-07-16 DIAGNOSIS — N186 End stage renal disease: Secondary | ICD-10-CM | POA: Diagnosis not present

## 2021-07-16 DIAGNOSIS — Z992 Dependence on renal dialysis: Secondary | ICD-10-CM | POA: Diagnosis not present

## 2021-07-18 DIAGNOSIS — Z794 Long term (current) use of insulin: Secondary | ICD-10-CM | POA: Diagnosis not present

## 2021-07-18 DIAGNOSIS — E119 Type 2 diabetes mellitus without complications: Secondary | ICD-10-CM | POA: Diagnosis not present

## 2021-07-18 DIAGNOSIS — Z992 Dependence on renal dialysis: Secondary | ICD-10-CM | POA: Diagnosis not present

## 2021-07-18 DIAGNOSIS — N186 End stage renal disease: Secondary | ICD-10-CM | POA: Diagnosis not present

## 2021-07-20 DIAGNOSIS — N186 End stage renal disease: Secondary | ICD-10-CM | POA: Diagnosis not present

## 2021-07-20 DIAGNOSIS — Z992 Dependence on renal dialysis: Secondary | ICD-10-CM | POA: Diagnosis not present

## 2021-07-23 DIAGNOSIS — N186 End stage renal disease: Secondary | ICD-10-CM | POA: Diagnosis not present

## 2021-07-23 DIAGNOSIS — Z992 Dependence on renal dialysis: Secondary | ICD-10-CM | POA: Diagnosis not present

## 2021-07-25 DIAGNOSIS — N186 End stage renal disease: Secondary | ICD-10-CM | POA: Diagnosis not present

## 2021-07-25 DIAGNOSIS — Z992 Dependence on renal dialysis: Secondary | ICD-10-CM | POA: Diagnosis not present

## 2021-07-26 DIAGNOSIS — Z7982 Long term (current) use of aspirin: Secondary | ICD-10-CM | POA: Diagnosis not present

## 2021-07-26 DIAGNOSIS — E1122 Type 2 diabetes mellitus with diabetic chronic kidney disease: Secondary | ICD-10-CM | POA: Diagnosis not present

## 2021-07-26 DIAGNOSIS — N189 Chronic kidney disease, unspecified: Secondary | ICD-10-CM | POA: Diagnosis not present

## 2021-07-26 DIAGNOSIS — Z79899 Other long term (current) drug therapy: Secondary | ICD-10-CM | POA: Diagnosis not present

## 2021-07-26 DIAGNOSIS — K219 Gastro-esophageal reflux disease without esophagitis: Secondary | ICD-10-CM | POA: Diagnosis not present

## 2021-07-26 DIAGNOSIS — N342 Other urethritis: Secondary | ICD-10-CM | POA: Diagnosis not present

## 2021-07-26 DIAGNOSIS — I129 Hypertensive chronic kidney disease with stage 1 through stage 4 chronic kidney disease, or unspecified chronic kidney disease: Secondary | ICD-10-CM | POA: Diagnosis not present

## 2021-07-27 DIAGNOSIS — Z992 Dependence on renal dialysis: Secondary | ICD-10-CM | POA: Diagnosis not present

## 2021-07-27 DIAGNOSIS — N186 End stage renal disease: Secondary | ICD-10-CM | POA: Diagnosis not present

## 2021-07-30 DIAGNOSIS — Z992 Dependence on renal dialysis: Secondary | ICD-10-CM | POA: Diagnosis not present

## 2021-07-30 DIAGNOSIS — N186 End stage renal disease: Secondary | ICD-10-CM | POA: Diagnosis not present

## 2021-08-01 DIAGNOSIS — Z992 Dependence on renal dialysis: Secondary | ICD-10-CM | POA: Diagnosis not present

## 2021-08-01 DIAGNOSIS — N186 End stage renal disease: Secondary | ICD-10-CM | POA: Diagnosis not present

## 2021-08-03 DIAGNOSIS — N186 End stage renal disease: Secondary | ICD-10-CM | POA: Diagnosis not present

## 2021-08-03 DIAGNOSIS — Z992 Dependence on renal dialysis: Secondary | ICD-10-CM | POA: Diagnosis not present

## 2021-08-06 DIAGNOSIS — Z992 Dependence on renal dialysis: Secondary | ICD-10-CM | POA: Diagnosis not present

## 2021-08-06 DIAGNOSIS — N186 End stage renal disease: Secondary | ICD-10-CM | POA: Diagnosis not present

## 2021-08-08 DIAGNOSIS — N186 End stage renal disease: Secondary | ICD-10-CM | POA: Diagnosis not present

## 2021-08-08 DIAGNOSIS — Z992 Dependence on renal dialysis: Secondary | ICD-10-CM | POA: Diagnosis not present

## 2021-08-10 DIAGNOSIS — N186 End stage renal disease: Secondary | ICD-10-CM | POA: Diagnosis not present

## 2021-08-10 DIAGNOSIS — Z992 Dependence on renal dialysis: Secondary | ICD-10-CM | POA: Diagnosis not present

## 2021-08-12 DIAGNOSIS — N186 End stage renal disease: Secondary | ICD-10-CM | POA: Diagnosis not present

## 2021-08-12 DIAGNOSIS — Z992 Dependence on renal dialysis: Secondary | ICD-10-CM | POA: Diagnosis not present

## 2021-08-13 DIAGNOSIS — Z992 Dependence on renal dialysis: Secondary | ICD-10-CM | POA: Diagnosis not present

## 2021-08-13 DIAGNOSIS — N186 End stage renal disease: Secondary | ICD-10-CM | POA: Diagnosis not present

## 2021-08-15 DIAGNOSIS — N186 End stage renal disease: Secondary | ICD-10-CM | POA: Diagnosis not present

## 2021-08-15 DIAGNOSIS — Z992 Dependence on renal dialysis: Secondary | ICD-10-CM | POA: Diagnosis not present

## 2021-08-17 DIAGNOSIS — Z992 Dependence on renal dialysis: Secondary | ICD-10-CM | POA: Diagnosis not present

## 2021-08-17 DIAGNOSIS — N186 End stage renal disease: Secondary | ICD-10-CM | POA: Diagnosis not present

## 2021-08-20 DIAGNOSIS — Z992 Dependence on renal dialysis: Secondary | ICD-10-CM | POA: Diagnosis not present

## 2021-08-20 DIAGNOSIS — N186 End stage renal disease: Secondary | ICD-10-CM | POA: Diagnosis not present

## 2021-08-22 DIAGNOSIS — Z992 Dependence on renal dialysis: Secondary | ICD-10-CM | POA: Diagnosis not present

## 2021-08-22 DIAGNOSIS — N186 End stage renal disease: Secondary | ICD-10-CM | POA: Diagnosis not present

## 2021-08-24 DIAGNOSIS — Z992 Dependence on renal dialysis: Secondary | ICD-10-CM | POA: Diagnosis not present

## 2021-08-24 DIAGNOSIS — N186 End stage renal disease: Secondary | ICD-10-CM | POA: Diagnosis not present

## 2021-08-27 DIAGNOSIS — Z992 Dependence on renal dialysis: Secondary | ICD-10-CM | POA: Diagnosis not present

## 2021-08-27 DIAGNOSIS — N186 End stage renal disease: Secondary | ICD-10-CM | POA: Diagnosis not present

## 2021-08-29 DIAGNOSIS — N186 End stage renal disease: Secondary | ICD-10-CM | POA: Diagnosis not present

## 2021-08-29 DIAGNOSIS — Z992 Dependence on renal dialysis: Secondary | ICD-10-CM | POA: Diagnosis not present

## 2021-08-30 DIAGNOSIS — E1142 Type 2 diabetes mellitus with diabetic polyneuropathy: Secondary | ICD-10-CM | POA: Diagnosis not present

## 2021-08-30 DIAGNOSIS — I10 Essential (primary) hypertension: Secondary | ICD-10-CM | POA: Diagnosis not present

## 2021-08-30 DIAGNOSIS — N390 Urinary tract infection, site not specified: Secondary | ICD-10-CM | POA: Diagnosis not present

## 2021-08-30 DIAGNOSIS — Z992 Dependence on renal dialysis: Secondary | ICD-10-CM | POA: Diagnosis not present

## 2021-08-31 DIAGNOSIS — Z992 Dependence on renal dialysis: Secondary | ICD-10-CM | POA: Diagnosis not present

## 2021-08-31 DIAGNOSIS — N186 End stage renal disease: Secondary | ICD-10-CM | POA: Diagnosis not present

## 2021-09-03 DIAGNOSIS — N186 End stage renal disease: Secondary | ICD-10-CM | POA: Diagnosis not present

## 2021-09-03 DIAGNOSIS — Z992 Dependence on renal dialysis: Secondary | ICD-10-CM | POA: Diagnosis not present

## 2021-09-05 DIAGNOSIS — N186 End stage renal disease: Secondary | ICD-10-CM | POA: Diagnosis not present

## 2021-09-05 DIAGNOSIS — Z992 Dependence on renal dialysis: Secondary | ICD-10-CM | POA: Diagnosis not present

## 2021-09-07 DIAGNOSIS — Z992 Dependence on renal dialysis: Secondary | ICD-10-CM | POA: Diagnosis not present

## 2021-09-07 DIAGNOSIS — N186 End stage renal disease: Secondary | ICD-10-CM | POA: Diagnosis not present

## 2021-09-10 DIAGNOSIS — N186 End stage renal disease: Secondary | ICD-10-CM | POA: Diagnosis not present

## 2021-09-10 DIAGNOSIS — Z992 Dependence on renal dialysis: Secondary | ICD-10-CM | POA: Diagnosis not present

## 2021-09-11 DIAGNOSIS — Z992 Dependence on renal dialysis: Secondary | ICD-10-CM | POA: Diagnosis not present

## 2021-09-11 DIAGNOSIS — N186 End stage renal disease: Secondary | ICD-10-CM | POA: Diagnosis not present

## 2021-09-12 DIAGNOSIS — N186 End stage renal disease: Secondary | ICD-10-CM | POA: Diagnosis not present

## 2021-09-12 DIAGNOSIS — Z992 Dependence on renal dialysis: Secondary | ICD-10-CM | POA: Diagnosis not present

## 2021-09-14 DIAGNOSIS — Z992 Dependence on renal dialysis: Secondary | ICD-10-CM | POA: Diagnosis not present

## 2021-09-14 DIAGNOSIS — N186 End stage renal disease: Secondary | ICD-10-CM | POA: Diagnosis not present

## 2021-09-17 DIAGNOSIS — N186 End stage renal disease: Secondary | ICD-10-CM | POA: Diagnosis not present

## 2021-09-17 DIAGNOSIS — Z992 Dependence on renal dialysis: Secondary | ICD-10-CM | POA: Diagnosis not present

## 2021-09-19 DIAGNOSIS — Z992 Dependence on renal dialysis: Secondary | ICD-10-CM | POA: Diagnosis not present

## 2021-09-19 DIAGNOSIS — N186 End stage renal disease: Secondary | ICD-10-CM | POA: Diagnosis not present

## 2021-09-21 DIAGNOSIS — N186 End stage renal disease: Secondary | ICD-10-CM | POA: Diagnosis not present

## 2021-09-21 DIAGNOSIS — Z992 Dependence on renal dialysis: Secondary | ICD-10-CM | POA: Diagnosis not present

## 2021-09-24 DIAGNOSIS — N186 End stage renal disease: Secondary | ICD-10-CM | POA: Diagnosis not present

## 2021-09-24 DIAGNOSIS — Z992 Dependence on renal dialysis: Secondary | ICD-10-CM | POA: Diagnosis not present

## 2021-09-26 DIAGNOSIS — N186 End stage renal disease: Secondary | ICD-10-CM | POA: Diagnosis not present

## 2021-09-26 DIAGNOSIS — Z992 Dependence on renal dialysis: Secondary | ICD-10-CM | POA: Diagnosis not present

## 2021-09-28 DIAGNOSIS — Z992 Dependence on renal dialysis: Secondary | ICD-10-CM | POA: Diagnosis not present

## 2021-09-28 DIAGNOSIS — N186 End stage renal disease: Secondary | ICD-10-CM | POA: Diagnosis not present

## 2021-10-01 DIAGNOSIS — N186 End stage renal disease: Secondary | ICD-10-CM | POA: Diagnosis not present

## 2021-10-01 DIAGNOSIS — Z992 Dependence on renal dialysis: Secondary | ICD-10-CM | POA: Diagnosis not present

## 2021-10-03 DIAGNOSIS — N186 End stage renal disease: Secondary | ICD-10-CM | POA: Diagnosis not present

## 2021-10-03 DIAGNOSIS — Z992 Dependence on renal dialysis: Secondary | ICD-10-CM | POA: Diagnosis not present

## 2021-10-05 DIAGNOSIS — N186 End stage renal disease: Secondary | ICD-10-CM | POA: Diagnosis not present

## 2021-10-05 DIAGNOSIS — Z992 Dependence on renal dialysis: Secondary | ICD-10-CM | POA: Diagnosis not present

## 2021-10-08 DIAGNOSIS — N186 End stage renal disease: Secondary | ICD-10-CM | POA: Diagnosis not present

## 2021-10-08 DIAGNOSIS — Z992 Dependence on renal dialysis: Secondary | ICD-10-CM | POA: Diagnosis not present

## 2021-10-08 DIAGNOSIS — D509 Iron deficiency anemia, unspecified: Secondary | ICD-10-CM | POA: Diagnosis not present

## 2021-10-08 DIAGNOSIS — E119 Type 2 diabetes mellitus without complications: Secondary | ICD-10-CM | POA: Diagnosis not present

## 2021-10-10 DIAGNOSIS — N186 End stage renal disease: Secondary | ICD-10-CM | POA: Diagnosis not present

## 2021-10-10 DIAGNOSIS — Z992 Dependence on renal dialysis: Secondary | ICD-10-CM | POA: Diagnosis not present

## 2021-10-12 DIAGNOSIS — N186 End stage renal disease: Secondary | ICD-10-CM | POA: Diagnosis not present

## 2021-10-12 DIAGNOSIS — Z992 Dependence on renal dialysis: Secondary | ICD-10-CM | POA: Diagnosis not present

## 2021-10-15 DIAGNOSIS — Z992 Dependence on renal dialysis: Secondary | ICD-10-CM | POA: Diagnosis not present

## 2021-10-15 DIAGNOSIS — N186 End stage renal disease: Secondary | ICD-10-CM | POA: Diagnosis not present

## 2021-10-17 DIAGNOSIS — Z992 Dependence on renal dialysis: Secondary | ICD-10-CM | POA: Diagnosis not present

## 2021-10-17 DIAGNOSIS — N186 End stage renal disease: Secondary | ICD-10-CM | POA: Diagnosis not present

## 2021-10-19 DIAGNOSIS — N186 End stage renal disease: Secondary | ICD-10-CM | POA: Diagnosis not present

## 2021-10-19 DIAGNOSIS — Z992 Dependence on renal dialysis: Secondary | ICD-10-CM | POA: Diagnosis not present

## 2021-10-22 DIAGNOSIS — N186 End stage renal disease: Secondary | ICD-10-CM | POA: Diagnosis not present

## 2021-10-22 DIAGNOSIS — Z992 Dependence on renal dialysis: Secondary | ICD-10-CM | POA: Diagnosis not present

## 2021-10-24 DIAGNOSIS — N186 End stage renal disease: Secondary | ICD-10-CM | POA: Diagnosis not present

## 2021-10-24 DIAGNOSIS — Z992 Dependence on renal dialysis: Secondary | ICD-10-CM | POA: Diagnosis not present

## 2021-10-26 DIAGNOSIS — N186 End stage renal disease: Secondary | ICD-10-CM | POA: Diagnosis not present

## 2021-10-26 DIAGNOSIS — Z992 Dependence on renal dialysis: Secondary | ICD-10-CM | POA: Diagnosis not present

## 2021-10-29 DIAGNOSIS — N186 End stage renal disease: Secondary | ICD-10-CM | POA: Diagnosis not present

## 2021-10-29 DIAGNOSIS — Z992 Dependence on renal dialysis: Secondary | ICD-10-CM | POA: Diagnosis not present

## 2021-10-31 DIAGNOSIS — Z992 Dependence on renal dialysis: Secondary | ICD-10-CM | POA: Diagnosis not present

## 2021-10-31 DIAGNOSIS — N186 End stage renal disease: Secondary | ICD-10-CM | POA: Diagnosis not present

## 2021-11-02 DIAGNOSIS — Z992 Dependence on renal dialysis: Secondary | ICD-10-CM | POA: Diagnosis not present

## 2021-11-02 DIAGNOSIS — N186 End stage renal disease: Secondary | ICD-10-CM | POA: Diagnosis not present

## 2021-11-05 DIAGNOSIS — Z992 Dependence on renal dialysis: Secondary | ICD-10-CM | POA: Diagnosis not present

## 2021-11-05 DIAGNOSIS — N186 End stage renal disease: Secondary | ICD-10-CM | POA: Diagnosis not present

## 2021-11-07 DIAGNOSIS — Z992 Dependence on renal dialysis: Secondary | ICD-10-CM | POA: Diagnosis not present

## 2021-11-07 DIAGNOSIS — N186 End stage renal disease: Secondary | ICD-10-CM | POA: Diagnosis not present

## 2021-11-09 DIAGNOSIS — N186 End stage renal disease: Secondary | ICD-10-CM | POA: Diagnosis not present

## 2021-11-09 DIAGNOSIS — Z992 Dependence on renal dialysis: Secondary | ICD-10-CM | POA: Diagnosis not present

## 2021-11-12 DIAGNOSIS — N186 End stage renal disease: Secondary | ICD-10-CM | POA: Diagnosis not present

## 2021-11-12 DIAGNOSIS — Z992 Dependence on renal dialysis: Secondary | ICD-10-CM | POA: Diagnosis not present

## 2021-11-14 DIAGNOSIS — Z992 Dependence on renal dialysis: Secondary | ICD-10-CM | POA: Diagnosis not present

## 2021-11-14 DIAGNOSIS — N186 End stage renal disease: Secondary | ICD-10-CM | POA: Diagnosis not present

## 2021-11-16 DIAGNOSIS — Z992 Dependence on renal dialysis: Secondary | ICD-10-CM | POA: Diagnosis not present

## 2021-11-16 DIAGNOSIS — N186 End stage renal disease: Secondary | ICD-10-CM | POA: Diagnosis not present

## 2021-11-19 DIAGNOSIS — Z992 Dependence on renal dialysis: Secondary | ICD-10-CM | POA: Diagnosis not present

## 2021-11-19 DIAGNOSIS — N186 End stage renal disease: Secondary | ICD-10-CM | POA: Diagnosis not present

## 2021-11-21 DIAGNOSIS — N186 End stage renal disease: Secondary | ICD-10-CM | POA: Diagnosis not present

## 2021-11-21 DIAGNOSIS — Z992 Dependence on renal dialysis: Secondary | ICD-10-CM | POA: Diagnosis not present

## 2021-11-22 DIAGNOSIS — I10 Essential (primary) hypertension: Secondary | ICD-10-CM | POA: Diagnosis not present

## 2021-11-22 DIAGNOSIS — K219 Gastro-esophageal reflux disease without esophagitis: Secondary | ICD-10-CM | POA: Diagnosis not present

## 2021-11-22 DIAGNOSIS — E1165 Type 2 diabetes mellitus with hyperglycemia: Secondary | ICD-10-CM | POA: Diagnosis not present

## 2021-11-22 DIAGNOSIS — Z1389 Encounter for screening for other disorder: Secondary | ICD-10-CM | POA: Diagnosis not present

## 2021-11-22 DIAGNOSIS — Z0001 Encounter for general adult medical examination with abnormal findings: Secondary | ICD-10-CM | POA: Diagnosis not present

## 2021-11-22 DIAGNOSIS — Z992 Dependence on renal dialysis: Secondary | ICD-10-CM | POA: Diagnosis not present

## 2021-11-23 DIAGNOSIS — N186 End stage renal disease: Secondary | ICD-10-CM | POA: Diagnosis not present

## 2021-11-23 DIAGNOSIS — Z992 Dependence on renal dialysis: Secondary | ICD-10-CM | POA: Diagnosis not present

## 2021-11-25 ENCOUNTER — Ambulatory Visit (HOSPITAL_COMMUNITY)
Admission: RE | Admit: 2021-11-25 | Discharge: 2021-11-25 | Disposition: A | Discharge status: Home / Self Care | Payer: Medicare Other | Source: Ambulatory Visit | Attending: Gerontology | Admitting: Gerontology

## 2021-11-25 ENCOUNTER — Other Ambulatory Visit (HOSPITAL_COMMUNITY): Payer: Self-pay | Admitting: Gerontology

## 2021-11-25 ENCOUNTER — Other Ambulatory Visit: Payer: Self-pay

## 2021-11-25 DIAGNOSIS — R059 Cough, unspecified: Secondary | ICD-10-CM | POA: Diagnosis not present

## 2021-11-26 DIAGNOSIS — N186 End stage renal disease: Secondary | ICD-10-CM | POA: Diagnosis not present

## 2021-11-26 DIAGNOSIS — Z992 Dependence on renal dialysis: Secondary | ICD-10-CM | POA: Diagnosis not present

## 2021-11-28 DIAGNOSIS — Z992 Dependence on renal dialysis: Secondary | ICD-10-CM | POA: Diagnosis not present

## 2021-11-28 DIAGNOSIS — N186 End stage renal disease: Secondary | ICD-10-CM | POA: Diagnosis not present

## 2021-11-30 DIAGNOSIS — N186 End stage renal disease: Secondary | ICD-10-CM | POA: Diagnosis not present

## 2021-11-30 DIAGNOSIS — Z992 Dependence on renal dialysis: Secondary | ICD-10-CM | POA: Diagnosis not present

## 2021-12-03 DIAGNOSIS — N186 End stage renal disease: Secondary | ICD-10-CM | POA: Diagnosis not present

## 2021-12-03 DIAGNOSIS — Z992 Dependence on renal dialysis: Secondary | ICD-10-CM | POA: Diagnosis not present

## 2021-12-05 DIAGNOSIS — N186 End stage renal disease: Secondary | ICD-10-CM | POA: Diagnosis not present

## 2021-12-05 DIAGNOSIS — Z992 Dependence on renal dialysis: Secondary | ICD-10-CM | POA: Diagnosis not present

## 2021-12-07 DIAGNOSIS — I5042 Chronic combined systolic (congestive) and diastolic (congestive) heart failure: Secondary | ICD-10-CM | POA: Diagnosis not present

## 2021-12-07 DIAGNOSIS — R0989 Other specified symptoms and signs involving the circulatory and respiratory systems: Secondary | ICD-10-CM | POA: Diagnosis not present

## 2021-12-07 DIAGNOSIS — Z992 Dependence on renal dialysis: Secondary | ICD-10-CM | POA: Diagnosis not present

## 2021-12-07 DIAGNOSIS — E1122 Type 2 diabetes mellitus with diabetic chronic kidney disease: Secondary | ICD-10-CM | POA: Diagnosis not present

## 2021-12-07 DIAGNOSIS — N186 End stage renal disease: Secondary | ICD-10-CM | POA: Diagnosis not present

## 2021-12-07 DIAGNOSIS — I132 Hypertensive heart and chronic kidney disease with heart failure and with stage 5 chronic kidney disease, or end stage renal disease: Secondary | ICD-10-CM | POA: Diagnosis not present

## 2021-12-07 DIAGNOSIS — R0602 Shortness of breath: Secondary | ICD-10-CM | POA: Diagnosis not present

## 2021-12-10 DIAGNOSIS — N186 End stage renal disease: Secondary | ICD-10-CM | POA: Diagnosis not present

## 2021-12-10 DIAGNOSIS — Z992 Dependence on renal dialysis: Secondary | ICD-10-CM | POA: Diagnosis not present

## 2021-12-12 DIAGNOSIS — N186 End stage renal disease: Secondary | ICD-10-CM | POA: Diagnosis not present

## 2021-12-12 DIAGNOSIS — Z992 Dependence on renal dialysis: Secondary | ICD-10-CM | POA: Diagnosis not present

## 2021-12-14 DIAGNOSIS — Z992 Dependence on renal dialysis: Secondary | ICD-10-CM | POA: Diagnosis not present

## 2021-12-14 DIAGNOSIS — N186 End stage renal disease: Secondary | ICD-10-CM | POA: Diagnosis not present

## 2021-12-17 DIAGNOSIS — N186 End stage renal disease: Secondary | ICD-10-CM | POA: Diagnosis not present

## 2021-12-17 DIAGNOSIS — Z992 Dependence on renal dialysis: Secondary | ICD-10-CM | POA: Diagnosis not present

## 2021-12-19 DIAGNOSIS — Z992 Dependence on renal dialysis: Secondary | ICD-10-CM | POA: Diagnosis not present

## 2021-12-19 DIAGNOSIS — N186 End stage renal disease: Secondary | ICD-10-CM | POA: Diagnosis not present

## 2021-12-21 DIAGNOSIS — N186 End stage renal disease: Secondary | ICD-10-CM | POA: Diagnosis not present

## 2021-12-21 DIAGNOSIS — Z992 Dependence on renal dialysis: Secondary | ICD-10-CM | POA: Diagnosis not present

## 2021-12-24 DIAGNOSIS — N186 End stage renal disease: Secondary | ICD-10-CM | POA: Diagnosis not present

## 2021-12-24 DIAGNOSIS — Z992 Dependence on renal dialysis: Secondary | ICD-10-CM | POA: Diagnosis not present

## 2021-12-26 DIAGNOSIS — I871 Compression of vein: Secondary | ICD-10-CM | POA: Diagnosis not present

## 2021-12-26 DIAGNOSIS — T82868A Thrombosis of vascular prosthetic devices, implants and grafts, initial encounter: Secondary | ICD-10-CM | POA: Diagnosis not present

## 2021-12-26 DIAGNOSIS — N186 End stage renal disease: Secondary | ICD-10-CM | POA: Diagnosis not present

## 2021-12-26 DIAGNOSIS — T82858A Stenosis of vascular prosthetic devices, implants and grafts, initial encounter: Secondary | ICD-10-CM | POA: Diagnosis not present

## 2021-12-27 DIAGNOSIS — N186 End stage renal disease: Secondary | ICD-10-CM | POA: Diagnosis not present

## 2021-12-27 DIAGNOSIS — Z992 Dependence on renal dialysis: Secondary | ICD-10-CM | POA: Diagnosis not present

## 2021-12-28 DIAGNOSIS — Z992 Dependence on renal dialysis: Secondary | ICD-10-CM | POA: Diagnosis not present

## 2021-12-28 DIAGNOSIS — N186 End stage renal disease: Secondary | ICD-10-CM | POA: Diagnosis not present

## 2021-12-31 DIAGNOSIS — Z992 Dependence on renal dialysis: Secondary | ICD-10-CM | POA: Diagnosis not present

## 2021-12-31 DIAGNOSIS — N186 End stage renal disease: Secondary | ICD-10-CM | POA: Diagnosis not present

## 2022-01-02 DIAGNOSIS — Z992 Dependence on renal dialysis: Secondary | ICD-10-CM | POA: Diagnosis not present

## 2022-01-02 DIAGNOSIS — N186 End stage renal disease: Secondary | ICD-10-CM | POA: Diagnosis not present

## 2022-01-04 DIAGNOSIS — N186 End stage renal disease: Secondary | ICD-10-CM | POA: Diagnosis not present

## 2022-01-04 DIAGNOSIS — Z992 Dependence on renal dialysis: Secondary | ICD-10-CM | POA: Diagnosis not present

## 2022-01-07 DIAGNOSIS — Z992 Dependence on renal dialysis: Secondary | ICD-10-CM | POA: Diagnosis not present

## 2022-01-07 DIAGNOSIS — E119 Type 2 diabetes mellitus without complications: Secondary | ICD-10-CM | POA: Diagnosis not present

## 2022-01-07 DIAGNOSIS — N186 End stage renal disease: Secondary | ICD-10-CM | POA: Diagnosis not present

## 2022-01-09 DIAGNOSIS — Z992 Dependence on renal dialysis: Secondary | ICD-10-CM | POA: Diagnosis not present

## 2022-01-09 DIAGNOSIS — N186 End stage renal disease: Secondary | ICD-10-CM | POA: Diagnosis not present

## 2022-01-10 DIAGNOSIS — N186 End stage renal disease: Secondary | ICD-10-CM | POA: Diagnosis not present

## 2022-01-10 DIAGNOSIS — Z992 Dependence on renal dialysis: Secondary | ICD-10-CM | POA: Diagnosis not present

## 2022-01-11 DIAGNOSIS — N186 End stage renal disease: Secondary | ICD-10-CM | POA: Diagnosis not present

## 2022-01-11 DIAGNOSIS — Z992 Dependence on renal dialysis: Secondary | ICD-10-CM | POA: Diagnosis not present

## 2022-01-14 DIAGNOSIS — N186 End stage renal disease: Secondary | ICD-10-CM | POA: Diagnosis not present

## 2022-01-14 DIAGNOSIS — Z992 Dependence on renal dialysis: Secondary | ICD-10-CM | POA: Diagnosis not present

## 2022-01-16 DIAGNOSIS — Z992 Dependence on renal dialysis: Secondary | ICD-10-CM | POA: Diagnosis not present

## 2022-01-16 DIAGNOSIS — N186 End stage renal disease: Secondary | ICD-10-CM | POA: Diagnosis not present

## 2022-01-18 DIAGNOSIS — Z992 Dependence on renal dialysis: Secondary | ICD-10-CM | POA: Diagnosis not present

## 2022-01-18 DIAGNOSIS — N186 End stage renal disease: Secondary | ICD-10-CM | POA: Diagnosis not present

## 2022-01-21 DIAGNOSIS — N186 End stage renal disease: Secondary | ICD-10-CM | POA: Diagnosis not present

## 2022-01-21 DIAGNOSIS — Z992 Dependence on renal dialysis: Secondary | ICD-10-CM | POA: Diagnosis not present

## 2022-01-23 DIAGNOSIS — N186 End stage renal disease: Secondary | ICD-10-CM | POA: Diagnosis not present

## 2022-01-23 DIAGNOSIS — Z992 Dependence on renal dialysis: Secondary | ICD-10-CM | POA: Diagnosis not present

## 2022-01-24 DIAGNOSIS — E1142 Type 2 diabetes mellitus with diabetic polyneuropathy: Secondary | ICD-10-CM | POA: Diagnosis not present

## 2022-01-24 DIAGNOSIS — I10 Essential (primary) hypertension: Secondary | ICD-10-CM | POA: Diagnosis not present

## 2022-01-24 DIAGNOSIS — N186 End stage renal disease: Secondary | ICD-10-CM | POA: Diagnosis not present

## 2022-01-24 DIAGNOSIS — K117 Disturbances of salivary secretion: Secondary | ICD-10-CM | POA: Diagnosis not present

## 2022-01-25 DIAGNOSIS — N186 End stage renal disease: Secondary | ICD-10-CM | POA: Diagnosis not present

## 2022-01-25 DIAGNOSIS — Z992 Dependence on renal dialysis: Secondary | ICD-10-CM | POA: Diagnosis not present

## 2022-01-28 DIAGNOSIS — N186 End stage renal disease: Secondary | ICD-10-CM | POA: Diagnosis not present

## 2022-01-28 DIAGNOSIS — Z992 Dependence on renal dialysis: Secondary | ICD-10-CM | POA: Diagnosis not present

## 2022-01-30 DIAGNOSIS — Z992 Dependence on renal dialysis: Secondary | ICD-10-CM | POA: Diagnosis not present

## 2022-01-30 DIAGNOSIS — N186 End stage renal disease: Secondary | ICD-10-CM | POA: Diagnosis not present

## 2022-02-01 DIAGNOSIS — N186 End stage renal disease: Secondary | ICD-10-CM | POA: Diagnosis not present

## 2022-02-01 DIAGNOSIS — Z992 Dependence on renal dialysis: Secondary | ICD-10-CM | POA: Diagnosis not present

## 2022-02-04 DIAGNOSIS — Z992 Dependence on renal dialysis: Secondary | ICD-10-CM | POA: Diagnosis not present

## 2022-02-04 DIAGNOSIS — N186 End stage renal disease: Secondary | ICD-10-CM | POA: Diagnosis not present

## 2022-02-06 DIAGNOSIS — Z992 Dependence on renal dialysis: Secondary | ICD-10-CM | POA: Diagnosis not present

## 2022-02-06 DIAGNOSIS — N186 End stage renal disease: Secondary | ICD-10-CM | POA: Diagnosis not present

## 2022-02-08 DIAGNOSIS — Z992 Dependence on renal dialysis: Secondary | ICD-10-CM | POA: Diagnosis not present

## 2022-02-08 DIAGNOSIS — N186 End stage renal disease: Secondary | ICD-10-CM | POA: Diagnosis not present

## 2022-02-09 DIAGNOSIS — Z992 Dependence on renal dialysis: Secondary | ICD-10-CM | POA: Diagnosis not present

## 2022-02-09 DIAGNOSIS — N186 End stage renal disease: Secondary | ICD-10-CM | POA: Diagnosis not present

## 2022-02-11 DIAGNOSIS — Z23 Encounter for immunization: Secondary | ICD-10-CM | POA: Diagnosis not present

## 2022-02-11 DIAGNOSIS — Z992 Dependence on renal dialysis: Secondary | ICD-10-CM | POA: Diagnosis not present

## 2022-02-11 DIAGNOSIS — N186 End stage renal disease: Secondary | ICD-10-CM | POA: Diagnosis not present

## 2022-02-13 DIAGNOSIS — Z23 Encounter for immunization: Secondary | ICD-10-CM | POA: Diagnosis not present

## 2022-02-13 DIAGNOSIS — N186 End stage renal disease: Secondary | ICD-10-CM | POA: Diagnosis not present

## 2022-02-13 DIAGNOSIS — Z992 Dependence on renal dialysis: Secondary | ICD-10-CM | POA: Diagnosis not present

## 2022-02-14 DIAGNOSIS — K219 Gastro-esophageal reflux disease without esophagitis: Secondary | ICD-10-CM | POA: Diagnosis not present

## 2022-02-14 DIAGNOSIS — E1142 Type 2 diabetes mellitus with diabetic polyneuropathy: Secondary | ICD-10-CM | POA: Diagnosis not present

## 2022-02-14 DIAGNOSIS — N186 End stage renal disease: Secondary | ICD-10-CM | POA: Diagnosis not present

## 2022-02-14 DIAGNOSIS — I10 Essential (primary) hypertension: Secondary | ICD-10-CM | POA: Diagnosis not present

## 2022-02-15 DIAGNOSIS — Z992 Dependence on renal dialysis: Secondary | ICD-10-CM | POA: Diagnosis not present

## 2022-02-15 DIAGNOSIS — Z23 Encounter for immunization: Secondary | ICD-10-CM | POA: Diagnosis not present

## 2022-02-15 DIAGNOSIS — N186 End stage renal disease: Secondary | ICD-10-CM | POA: Diagnosis not present

## 2022-02-18 DIAGNOSIS — N186 End stage renal disease: Secondary | ICD-10-CM | POA: Diagnosis not present

## 2022-02-18 DIAGNOSIS — Z23 Encounter for immunization: Secondary | ICD-10-CM | POA: Diagnosis not present

## 2022-02-18 DIAGNOSIS — Z992 Dependence on renal dialysis: Secondary | ICD-10-CM | POA: Diagnosis not present

## 2022-02-20 DIAGNOSIS — N186 End stage renal disease: Secondary | ICD-10-CM | POA: Diagnosis not present

## 2022-02-20 DIAGNOSIS — Z992 Dependence on renal dialysis: Secondary | ICD-10-CM | POA: Diagnosis not present

## 2022-02-20 DIAGNOSIS — Z23 Encounter for immunization: Secondary | ICD-10-CM | POA: Diagnosis not present

## 2022-02-22 DIAGNOSIS — N186 End stage renal disease: Secondary | ICD-10-CM | POA: Diagnosis not present

## 2022-02-22 DIAGNOSIS — Z992 Dependence on renal dialysis: Secondary | ICD-10-CM | POA: Diagnosis not present

## 2022-02-22 DIAGNOSIS — Z23 Encounter for immunization: Secondary | ICD-10-CM | POA: Diagnosis not present

## 2022-02-25 DIAGNOSIS — Z992 Dependence on renal dialysis: Secondary | ICD-10-CM | POA: Diagnosis not present

## 2022-02-25 DIAGNOSIS — Z23 Encounter for immunization: Secondary | ICD-10-CM | POA: Diagnosis not present

## 2022-02-25 DIAGNOSIS — N186 End stage renal disease: Secondary | ICD-10-CM | POA: Diagnosis not present

## 2022-02-27 DIAGNOSIS — N186 End stage renal disease: Secondary | ICD-10-CM | POA: Diagnosis not present

## 2022-02-27 DIAGNOSIS — Z992 Dependence on renal dialysis: Secondary | ICD-10-CM | POA: Diagnosis not present

## 2022-02-27 DIAGNOSIS — Z23 Encounter for immunization: Secondary | ICD-10-CM | POA: Diagnosis not present

## 2022-03-01 DIAGNOSIS — Z992 Dependence on renal dialysis: Secondary | ICD-10-CM | POA: Diagnosis not present

## 2022-03-01 DIAGNOSIS — Z23 Encounter for immunization: Secondary | ICD-10-CM | POA: Diagnosis not present

## 2022-03-01 DIAGNOSIS — N186 End stage renal disease: Secondary | ICD-10-CM | POA: Diagnosis not present

## 2022-03-04 DIAGNOSIS — Z992 Dependence on renal dialysis: Secondary | ICD-10-CM | POA: Diagnosis not present

## 2022-03-04 DIAGNOSIS — Z23 Encounter for immunization: Secondary | ICD-10-CM | POA: Diagnosis not present

## 2022-03-04 DIAGNOSIS — N186 End stage renal disease: Secondary | ICD-10-CM | POA: Diagnosis not present

## 2022-03-06 DIAGNOSIS — Z992 Dependence on renal dialysis: Secondary | ICD-10-CM | POA: Diagnosis not present

## 2022-03-06 DIAGNOSIS — Z23 Encounter for immunization: Secondary | ICD-10-CM | POA: Diagnosis not present

## 2022-03-06 DIAGNOSIS — N186 End stage renal disease: Secondary | ICD-10-CM | POA: Diagnosis not present

## 2022-03-08 DIAGNOSIS — N186 End stage renal disease: Secondary | ICD-10-CM | POA: Diagnosis not present

## 2022-03-08 DIAGNOSIS — Z23 Encounter for immunization: Secondary | ICD-10-CM | POA: Diagnosis not present

## 2022-03-08 DIAGNOSIS — Z992 Dependence on renal dialysis: Secondary | ICD-10-CM | POA: Diagnosis not present

## 2022-03-11 DIAGNOSIS — Z992 Dependence on renal dialysis: Secondary | ICD-10-CM | POA: Diagnosis not present

## 2022-03-11 DIAGNOSIS — Z23 Encounter for immunization: Secondary | ICD-10-CM | POA: Diagnosis not present

## 2022-03-11 DIAGNOSIS — N186 End stage renal disease: Secondary | ICD-10-CM | POA: Diagnosis not present

## 2022-03-12 DIAGNOSIS — N186 End stage renal disease: Secondary | ICD-10-CM | POA: Diagnosis not present

## 2022-03-12 DIAGNOSIS — Z992 Dependence on renal dialysis: Secondary | ICD-10-CM | POA: Diagnosis not present

## 2022-03-13 DIAGNOSIS — N186 End stage renal disease: Secondary | ICD-10-CM | POA: Diagnosis not present

## 2022-03-13 DIAGNOSIS — Z992 Dependence on renal dialysis: Secondary | ICD-10-CM | POA: Diagnosis not present

## 2022-03-15 DIAGNOSIS — N186 End stage renal disease: Secondary | ICD-10-CM | POA: Diagnosis not present

## 2022-03-15 DIAGNOSIS — Z992 Dependence on renal dialysis: Secondary | ICD-10-CM | POA: Diagnosis not present

## 2022-03-18 DIAGNOSIS — N186 End stage renal disease: Secondary | ICD-10-CM | POA: Diagnosis not present

## 2022-03-18 DIAGNOSIS — Z992 Dependence on renal dialysis: Secondary | ICD-10-CM | POA: Diagnosis not present

## 2022-03-20 DIAGNOSIS — N186 End stage renal disease: Secondary | ICD-10-CM | POA: Diagnosis not present

## 2022-03-20 DIAGNOSIS — Z992 Dependence on renal dialysis: Secondary | ICD-10-CM | POA: Diagnosis not present

## 2022-03-22 DIAGNOSIS — Z992 Dependence on renal dialysis: Secondary | ICD-10-CM | POA: Diagnosis not present

## 2022-03-22 DIAGNOSIS — N186 End stage renal disease: Secondary | ICD-10-CM | POA: Diagnosis not present

## 2022-03-25 DIAGNOSIS — Z992 Dependence on renal dialysis: Secondary | ICD-10-CM | POA: Diagnosis not present

## 2022-03-25 DIAGNOSIS — N186 End stage renal disease: Secondary | ICD-10-CM | POA: Diagnosis not present

## 2022-03-27 DIAGNOSIS — Z992 Dependence on renal dialysis: Secondary | ICD-10-CM | POA: Diagnosis not present

## 2022-03-27 DIAGNOSIS — N186 End stage renal disease: Secondary | ICD-10-CM | POA: Diagnosis not present

## 2022-03-29 DIAGNOSIS — N186 End stage renal disease: Secondary | ICD-10-CM | POA: Diagnosis not present

## 2022-03-29 DIAGNOSIS — Z992 Dependence on renal dialysis: Secondary | ICD-10-CM | POA: Diagnosis not present

## 2022-04-01 DIAGNOSIS — Z992 Dependence on renal dialysis: Secondary | ICD-10-CM | POA: Diagnosis not present

## 2022-04-01 DIAGNOSIS — N186 End stage renal disease: Secondary | ICD-10-CM | POA: Diagnosis not present

## 2022-04-03 DIAGNOSIS — N186 End stage renal disease: Secondary | ICD-10-CM | POA: Diagnosis not present

## 2022-04-03 DIAGNOSIS — Z992 Dependence on renal dialysis: Secondary | ICD-10-CM | POA: Diagnosis not present

## 2022-04-05 DIAGNOSIS — N186 End stage renal disease: Secondary | ICD-10-CM | POA: Diagnosis not present

## 2022-04-05 DIAGNOSIS — Z992 Dependence on renal dialysis: Secondary | ICD-10-CM | POA: Diagnosis not present

## 2022-04-08 DIAGNOSIS — Z992 Dependence on renal dialysis: Secondary | ICD-10-CM | POA: Diagnosis not present

## 2022-04-08 DIAGNOSIS — N186 End stage renal disease: Secondary | ICD-10-CM | POA: Diagnosis not present

## 2022-04-10 DIAGNOSIS — Z992 Dependence on renal dialysis: Secondary | ICD-10-CM | POA: Diagnosis not present

## 2022-04-10 DIAGNOSIS — N186 End stage renal disease: Secondary | ICD-10-CM | POA: Diagnosis not present

## 2022-04-11 DIAGNOSIS — N186 End stage renal disease: Secondary | ICD-10-CM | POA: Diagnosis not present

## 2022-04-11 DIAGNOSIS — Z992 Dependence on renal dialysis: Secondary | ICD-10-CM | POA: Diagnosis not present

## 2022-04-12 DIAGNOSIS — Z992 Dependence on renal dialysis: Secondary | ICD-10-CM | POA: Diagnosis not present

## 2022-04-12 DIAGNOSIS — N186 End stage renal disease: Secondary | ICD-10-CM | POA: Diagnosis not present

## 2022-04-15 DIAGNOSIS — N186 End stage renal disease: Secondary | ICD-10-CM | POA: Diagnosis not present

## 2022-04-15 DIAGNOSIS — Z992 Dependence on renal dialysis: Secondary | ICD-10-CM | POA: Diagnosis not present

## 2022-04-17 DIAGNOSIS — N186 End stage renal disease: Secondary | ICD-10-CM | POA: Diagnosis not present

## 2022-04-17 DIAGNOSIS — Z992 Dependence on renal dialysis: Secondary | ICD-10-CM | POA: Diagnosis not present

## 2022-04-19 DIAGNOSIS — Z992 Dependence on renal dialysis: Secondary | ICD-10-CM | POA: Diagnosis not present

## 2022-04-19 DIAGNOSIS — N186 End stage renal disease: Secondary | ICD-10-CM | POA: Diagnosis not present

## 2022-04-22 DIAGNOSIS — Z992 Dependence on renal dialysis: Secondary | ICD-10-CM | POA: Diagnosis not present

## 2022-04-22 DIAGNOSIS — N186 End stage renal disease: Secondary | ICD-10-CM | POA: Diagnosis not present

## 2022-04-24 DIAGNOSIS — N186 End stage renal disease: Secondary | ICD-10-CM | POA: Diagnosis not present

## 2022-04-24 DIAGNOSIS — Z992 Dependence on renal dialysis: Secondary | ICD-10-CM | POA: Diagnosis not present

## 2022-04-26 DIAGNOSIS — N186 End stage renal disease: Secondary | ICD-10-CM | POA: Diagnosis not present

## 2022-04-26 DIAGNOSIS — Z992 Dependence on renal dialysis: Secondary | ICD-10-CM | POA: Diagnosis not present

## 2022-04-29 ENCOUNTER — Ambulatory Visit: Payer: Medicare Other | Admitting: Urology

## 2022-04-29 DIAGNOSIS — Z8546 Personal history of malignant neoplasm of prostate: Secondary | ICD-10-CM

## 2022-04-29 DIAGNOSIS — Z992 Dependence on renal dialysis: Secondary | ICD-10-CM | POA: Diagnosis not present

## 2022-04-29 DIAGNOSIS — R339 Retention of urine, unspecified: Secondary | ICD-10-CM

## 2022-04-29 DIAGNOSIS — N186 End stage renal disease: Secondary | ICD-10-CM | POA: Diagnosis not present

## 2022-05-01 DIAGNOSIS — Z992 Dependence on renal dialysis: Secondary | ICD-10-CM | POA: Diagnosis not present

## 2022-05-01 DIAGNOSIS — N186 End stage renal disease: Secondary | ICD-10-CM | POA: Diagnosis not present

## 2022-05-03 DIAGNOSIS — N186 End stage renal disease: Secondary | ICD-10-CM | POA: Diagnosis not present

## 2022-05-03 DIAGNOSIS — Z992 Dependence on renal dialysis: Secondary | ICD-10-CM | POA: Diagnosis not present

## 2022-05-06 DIAGNOSIS — Z992 Dependence on renal dialysis: Secondary | ICD-10-CM | POA: Diagnosis not present

## 2022-05-06 DIAGNOSIS — N186 End stage renal disease: Secondary | ICD-10-CM | POA: Diagnosis not present

## 2022-05-08 DIAGNOSIS — N186 End stage renal disease: Secondary | ICD-10-CM | POA: Diagnosis not present

## 2022-05-08 DIAGNOSIS — Z992 Dependence on renal dialysis: Secondary | ICD-10-CM | POA: Diagnosis not present

## 2022-05-10 DIAGNOSIS — Z992 Dependence on renal dialysis: Secondary | ICD-10-CM | POA: Diagnosis not present

## 2022-05-10 DIAGNOSIS — N186 End stage renal disease: Secondary | ICD-10-CM | POA: Diagnosis not present

## 2022-05-12 DIAGNOSIS — Z992 Dependence on renal dialysis: Secondary | ICD-10-CM | POA: Diagnosis not present

## 2022-05-12 DIAGNOSIS — N186 End stage renal disease: Secondary | ICD-10-CM | POA: Diagnosis not present

## 2022-05-13 DIAGNOSIS — Z992 Dependence on renal dialysis: Secondary | ICD-10-CM | POA: Diagnosis not present

## 2022-05-13 DIAGNOSIS — N186 End stage renal disease: Secondary | ICD-10-CM | POA: Diagnosis not present

## 2022-05-15 DIAGNOSIS — N186 End stage renal disease: Secondary | ICD-10-CM | POA: Diagnosis not present

## 2022-05-15 DIAGNOSIS — Z992 Dependence on renal dialysis: Secondary | ICD-10-CM | POA: Diagnosis not present

## 2022-05-17 DIAGNOSIS — N186 End stage renal disease: Secondary | ICD-10-CM | POA: Diagnosis not present

## 2022-05-17 DIAGNOSIS — Z992 Dependence on renal dialysis: Secondary | ICD-10-CM | POA: Diagnosis not present

## 2022-05-20 DIAGNOSIS — Z992 Dependence on renal dialysis: Secondary | ICD-10-CM | POA: Diagnosis not present

## 2022-05-20 DIAGNOSIS — N186 End stage renal disease: Secondary | ICD-10-CM | POA: Diagnosis not present

## 2022-05-22 DIAGNOSIS — Z992 Dependence on renal dialysis: Secondary | ICD-10-CM | POA: Diagnosis not present

## 2022-05-22 DIAGNOSIS — N186 End stage renal disease: Secondary | ICD-10-CM | POA: Diagnosis not present

## 2022-05-24 DIAGNOSIS — Z992 Dependence on renal dialysis: Secondary | ICD-10-CM | POA: Diagnosis not present

## 2022-05-24 DIAGNOSIS — N186 End stage renal disease: Secondary | ICD-10-CM | POA: Diagnosis not present

## 2022-05-27 DIAGNOSIS — N186 End stage renal disease: Secondary | ICD-10-CM | POA: Diagnosis not present

## 2022-05-27 DIAGNOSIS — Z992 Dependence on renal dialysis: Secondary | ICD-10-CM | POA: Diagnosis not present

## 2022-05-29 ENCOUNTER — Encounter (HOSPITAL_COMMUNITY): Payer: Self-pay | Admitting: Emergency Medicine

## 2022-05-29 ENCOUNTER — Emergency Department (HOSPITAL_COMMUNITY): Payer: Medicare Other

## 2022-05-29 ENCOUNTER — Other Ambulatory Visit: Payer: Self-pay

## 2022-05-29 ENCOUNTER — Emergency Department (HOSPITAL_COMMUNITY)
Admission: EM | Admit: 2022-05-29 | Discharge: 2022-05-29 | Disposition: A | Discharge status: Home / Self Care | Payer: Medicare Other | Attending: Student | Admitting: Student

## 2022-05-29 DIAGNOSIS — Z992 Dependence on renal dialysis: Secondary | ICD-10-CM | POA: Diagnosis not present

## 2022-05-29 DIAGNOSIS — D631 Anemia in chronic kidney disease: Secondary | ICD-10-CM | POA: Diagnosis not present

## 2022-05-29 DIAGNOSIS — E1122 Type 2 diabetes mellitus with diabetic chronic kidney disease: Secondary | ICD-10-CM | POA: Diagnosis not present

## 2022-05-29 DIAGNOSIS — R0789 Other chest pain: Secondary | ICD-10-CM | POA: Insufficient documentation

## 2022-05-29 DIAGNOSIS — R911 Solitary pulmonary nodule: Secondary | ICD-10-CM | POA: Diagnosis not present

## 2022-05-29 DIAGNOSIS — Z7982 Long term (current) use of aspirin: Secondary | ICD-10-CM | POA: Insufficient documentation

## 2022-05-29 DIAGNOSIS — K449 Diaphragmatic hernia without obstruction or gangrene: Secondary | ICD-10-CM | POA: Diagnosis not present

## 2022-05-29 DIAGNOSIS — I10 Essential (primary) hypertension: Secondary | ICD-10-CM | POA: Diagnosis not present

## 2022-05-29 DIAGNOSIS — I129 Hypertensive chronic kidney disease with stage 1 through stage 4 chronic kidney disease, or unspecified chronic kidney disease: Secondary | ICD-10-CM | POA: Insufficient documentation

## 2022-05-29 DIAGNOSIS — N184 Chronic kidney disease, stage 4 (severe): Secondary | ICD-10-CM | POA: Diagnosis not present

## 2022-05-29 DIAGNOSIS — N186 End stage renal disease: Secondary | ICD-10-CM | POA: Diagnosis not present

## 2022-05-29 DIAGNOSIS — Z79899 Other long term (current) drug therapy: Secondary | ICD-10-CM | POA: Insufficient documentation

## 2022-05-29 DIAGNOSIS — R52 Pain, unspecified: Secondary | ICD-10-CM | POA: Diagnosis not present

## 2022-05-29 DIAGNOSIS — Z8546 Personal history of malignant neoplasm of prostate: Secondary | ICD-10-CM | POA: Diagnosis not present

## 2022-05-29 DIAGNOSIS — I700 Atherosclerosis of aorta: Secondary | ICD-10-CM | POA: Diagnosis not present

## 2022-05-29 DIAGNOSIS — R109 Unspecified abdominal pain: Secondary | ICD-10-CM | POA: Diagnosis not present

## 2022-05-29 DIAGNOSIS — J811 Chronic pulmonary edema: Secondary | ICD-10-CM | POA: Diagnosis not present

## 2022-05-29 DIAGNOSIS — Z87891 Personal history of nicotine dependence: Secondary | ICD-10-CM | POA: Diagnosis not present

## 2022-05-29 DIAGNOSIS — R079 Chest pain, unspecified: Secondary | ICD-10-CM | POA: Diagnosis not present

## 2022-05-29 DIAGNOSIS — N281 Cyst of kidney, acquired: Secondary | ICD-10-CM | POA: Diagnosis not present

## 2022-05-29 DIAGNOSIS — Z743 Need for continuous supervision: Secondary | ICD-10-CM | POA: Diagnosis not present

## 2022-05-29 DIAGNOSIS — R0689 Other abnormalities of breathing: Secondary | ICD-10-CM | POA: Diagnosis not present

## 2022-05-29 LAB — BASIC METABOLIC PANEL
Anion gap: 8 (ref 5–15)
BUN: 38 mg/dL — ABNORMAL HIGH (ref 8–23)
CO2: 28 mmol/L (ref 22–32)
Calcium: 9.3 mg/dL (ref 8.9–10.3)
Chloride: 99 mmol/L (ref 98–111)
Creatinine, Ser: 5.57 mg/dL — ABNORMAL HIGH (ref 0.61–1.24)
GFR, Estimated: 9 mL/min — ABNORMAL LOW (ref >60)
Glucose, Bld: 109 mg/dL — ABNORMAL HIGH (ref 70–99)
Potassium: 4.7 mmol/L (ref 3.5–5.1)
Sodium: 135 mmol/L (ref 135–145)

## 2022-05-29 LAB — HEPATIC FUNCTION PANEL
ALT: 16 U/L (ref 0–44)
AST: 22 U/L (ref 15–41)
Albumin: 4 g/dL (ref 3.5–5.0)
Alkaline Phosphatase: 75 U/L (ref 38–126)
Bilirubin, Direct: <0.1 mg/dL (ref 0.0–0.2)
Total Bilirubin: 0.4 mg/dL (ref 0.3–1.2)
Total Protein: 8.1 g/dL (ref 6.5–8.1)

## 2022-05-29 LAB — CBC
HCT: 36.3 % — ABNORMAL LOW (ref 39.0–52.0)
Hemoglobin: 12.1 g/dL — ABNORMAL LOW (ref 13.0–17.0)
MCH: 27.4 pg (ref 26.0–34.0)
MCHC: 33.3 g/dL (ref 30.0–36.0)
MCV: 82.3 fL (ref 80.0–100.0)
Platelets: 214 10*3/uL (ref 150–400)
RBC: 4.41 MIL/uL (ref 4.22–5.81)
RDW: 13.8 % (ref 11.5–15.5)
WBC: 6.1 10*3/uL (ref 4.0–10.5)
nRBC: 0 % (ref 0.0–0.2)

## 2022-05-29 LAB — TROPONIN I (HIGH SENSITIVITY)
Troponin I (High Sensitivity): 25 ng/L — ABNORMAL HIGH (ref <18)
Troponin I (High Sensitivity): 27 ng/L — ABNORMAL HIGH (ref <18)

## 2022-05-29 MED ORDER — IOHEXOL 350 MG/ML SOLN
100.0000 mL | Freq: Once | INTRAVENOUS | Status: AC | PRN
  Administered 2022-05-29: 75 mL via INTRAVENOUS

## 2022-05-29 NOTE — ED Provider Notes (Signed)
[facility] EMERGENCY DEPARTMENT Provider Note  CSN: [id number] Arrival date & time: 05/29/22 1253  Chief Complaint(s) Chest Pain  HPI Steve [name] is a 86 y.o. male with PMH ESRD on hemodialysis is a Thursday Saturday, previous history of prostate cancer, T2DM, HTN who presents emergency department for evaluation of chest tightness.  Patient states that he had 1 episode last week and 1 episodes of today of mild chest discomfort while receiving dialysis.  He states that he "just feels a little tight".  He states he would not have come if the dialysis nurse did not insist that he comes.  He denies current chest pain, pressure, abdominal pain, nausea, vomiting or other systemic symptoms here in the emergency department.   Past Medical History Past Medical History:  Diagnosis Date   Anemia    Arthritis    CKD (chronic kidney disease) stage 4, GFR 15-29 ml/min (HCC)    Essential hypertension    GERD (gastroesophageal reflux disease)    Hyperlipidemia    Prostate cancer ([place])    took radiation   Renal insufficiency    Type 2 diabetes mellitus ([place])    Patient Active Problem List   Diagnosis Date Noted   Status post cervical discectomy 08/23/2020   Cervical myelopathy ([place]) 08/23/2020   Atypical chest pain 04/18/2020   Acute kidney injury superimposed on CKD ([place]) 04/18/2020   Gastroesophageal reflux disease    Fall at home, initial encounter 07/26/2018   Acute left-sided weakness 07/25/2018   Hyperlipidemia 07/25/2018   CKD (chronic kidney disease), stage IV ([place]) 05/13/2018   Chronic kidney disease, stage 4 (severe) ([place]) 05/13/2018   Chest pain 05/12/2018   Elevated troponin 07/02/2017   DM (diabetes mellitus), type 2 with renal complications ([place]) 08/06/8526   IBS (irritable bowel syndrome) 01/28/2017   Prostate cancer ([place]) 07/10/2014   Abnormal ECG 04/12/2013   HTN (hypertension) 04/12/2013   SOB (shortness of breath) 04/12/2013   Type 2 diabetes mellitus with diabetic  nephropathy ([place]) 01/11/2013   CHEST PAIN-UNSPECIFIED 06/12/2009   Home Medication(s) Prior to Admission medications   Medication Sig Start Date End Date Taking? Authorizing Provider  Accu-Chek Softclix Lancets lancets  10/27/19   [provider]  aspirin 81 MG tablet Take 81 mg by mouth daily.    [provider]  gabapentin (NEURONTIN) 300 MG capsule Take 300 mg by mouth in the morning. 01/19/15   [provider]  glucose blood (ACCU-CHEK AVIVA PLUS) test strip  11/27/17   [provider]  hydrALAZINE (APRESOLINE) 50 MG tablet Take 50 mg by mouth 3 (three) times daily.    [provider]  Lancets Misc. (ACCU-CHEK SOFTCLIX LANCET DEV) KIT  10/28/19   [provider]  linagliptin (TRADJENTA) 5 MG TABS tablet Take 5 mg by mouth daily.    [provider]  metoprolol tartrate (LOPRESSOR) 25 MG tablet Take 1 tablet (25 mg total) by mouth 2 (two) times daily. 04/18/20   Barton Dubois, MD  Multiple Vitamin (MULTIVITAMIN WITH MINERALS) TABS tablet Take 1 tablet by mouth daily.    [provider]  NIFEdipine (PROCARDIA XL/ADALAT-CC) 90 MG 24 hr tablet TAKE (1) TABLET BY MOUTH ONCE DAILY. Patient taking differently: Take 90 mg by mouth daily before breakfast. 06/17/18   Herminio Commons, MD  oxyCODONE-acetaminophen (PERCOCET) 5-325 MG tablet Take 1 tablet by mouth every 6 (six) hours as needed for severe pain. 02/21/21   Rosetta Posner, MD  sodium bicarbonate 650 MG tablet Take 650  mg by mouth 2 (two) times daily.  06/24/19   [provider]  traMADol (ULTRAM) 50 MG tablet Take 1 tablet (50 mg total) by mouth every 12 (twelve) hours as needed for severe pain. 04/18/20   Barton Dubois, MD  zolpidem (AMBIEN) 5 MG tablet Take 5 mg by mouth at bedtime as needed for sleep. 07/11/19   [provider]                                                                                                                                     Past Surgical History Past Surgical History:  Procedure Laterality Date   ANTERIOR CERVICAL DECOMP/DISCECTOMY FUSION N/A 08/23/2020   Procedure: Cervical 3-4 Anterior cervical decompression/discectomy/fusion;  Surgeon: Erline Levine, MD;  Location: [facility];  Service: Neurosurgery;  Laterality: N/A;  3C/RM 21 to follow   AV FISTULA PLACEMENT Left 02/21/2021   Procedure: INSERTION OF ARTERIOVENOUS GORE-TEX GRAFT LEFT ARM;  Surgeon: Rosetta Posner, MD;  Location: [facility];  Service: Vascular;  Laterality: Left;   CATARACT EXTRACTION W/PHACO Right 04/28/2016   Procedure: CATARACT EXTRACTION PHACO AND INTRAOCULAR LENS PLACEMENT ([place]);  Surgeon: Williams Che, MD;  Location: [facility];  Service: Ophthalmology;  Laterality: Right;  CDE: 4.28   COLONOSCOPY     about 2 years in [place]     pt. denies   IR FLUORO GUIDE CV LINE LEFT  12/20/2020   IR US GUIDE VASC ACCESS LEFT  12/20/2020   Family History Family History  Problem Relation Age of Onset   Heart disease Mother        cause of death   Diabetes Sister    Colon cancer Neg Hx     Social History Social History   Tobacco Use   Smoking status: Former    Packs/day: 2.00    Years: 25.00    Total pack years: 50.00    Types: Pipe, Cigars, Cigarettes    Start date: 11/08/1954    Quit date: 10/14/1987    Years since quitting: 34.6   Smokeless tobacco: Never   Tobacco comments:    About 2 pipes a day  Vaping Use   Vaping Use: Never used  Substance Use Topics   Alcohol use: No    Alcohol/week: 0.0 standard drinks of alcohol   Drug use: No   Allergies Patient has no known allergies.  Review of Systems Review of Systems  Respiratory:  Positive for chest tightness.     Physical Exam Vital Signs  I have reviewed the triage vital signs BP (!) 166/70 (BP Location: Right Arm)   Pulse 63   Temp 98.1 F (36.7 C) (Oral)   Resp 18   Ht _0  (1.651 m)   Wt 79.4 kg   SpO2 100%   BMI 29.12 kg/m   Physical  Exam Constitutional:      General: He is not  in acute distress.    Appearance: Normal appearance.  HENT:     Head: Normocephalic and atraumatic.     Nose: No congestion or rhinorrhea.  Eyes:     General:        Right eye: No discharge.        Left eye: No discharge.     Extraocular Movements: Extraocular movements intact.     Pupils: Pupils are equal, round, and reactive to light.  Cardiovascular:     Rate and Rhythm: Normal rate and regular rhythm.     Heart sounds: No murmur heard. Pulmonary:     Effort: No respiratory distress.     Breath sounds: No wheezing or rales.  Abdominal:     General: There is no distension.     Tenderness: There is no abdominal tenderness.     Comments: Abdominal distention  Musculoskeletal:        General: Normal range of motion.     Cervical back: Normal range of motion.  Skin:    General: Skin is warm and dry.  Neurological:     General: No focal deficit present.     Mental Status: He is alert.     ED Results and Treatments Labs (all labs ordered are listed, but only abnormal results are displayed) Labs Reviewed  BASIC METABOLIC PANEL - Abnormal; Notable for the following components:      Result Value   Glucose, Bld 109 (*)    BUN 38 (*)    Creatinine, Ser 5.57 (*)    GFR, Estimated 9 (*)    All other components within normal limits  CBC - Abnormal; Notable for the following components:   Hemoglobin 12.1 (*)    HCT 36.3 (*)    All other components within normal limits  TROPONIN I (HIGH SENSITIVITY) - Abnormal; Notable for the following components:   Troponin I (High Sensitivity) 27 (*)    All other components within normal limits  TROPONIN I (HIGH SENSITIVITY) - Abnormal; Notable for the following components:   Troponin I (High Sensitivity) 25 (*)    All other components within normal limits  HEPATIC FUNCTION PANEL                                                                                                                           Radiology CT Angio Chest PE W and/or Wo Contrast  Result Date: 05/29/2022 CLINICAL DATA:  Chest pain. Suspected ascites and abdominal mass. EXAM: CT ANGIOGRAPHY CHEST CT ABDOMEN AND PELVIS WITH CONTRAST TECHNIQUE: Multidetector CT imaging of the chest was performed using the standard protocol during bolus administration of intravenous contrast. Multiplanar CT image reconstructions and MIPs were obtained to evaluate the vascular anatomy. Multidetector CT imaging of the abdomen and pelvis was performed using the standard protocol during bolus administration of intravenous contrast. RADIATION DOSE REDUCTION: This exam was performed according to the departmental dose-optimization program which includes automated exposure control, adjustment of the mA and/or  kV according to patient size and/or use of iterative reconstruction technique. Unless otherwise stated, incidental findings do not required dedicated follow-up imaging. CONTRAST:  13mL OMNIPAQUE IOHEXOL 350 MG/ML SOLN COMPARISON:  July 25, 2018. FINDINGS: CTA CHEST FINDINGS Cardiovascular: Aortic atherosclerosis both calcified and noncalcified in the thoracic aorta. No sign of aneurysmal dilation. Three-vessel branching pattern in the chest. Moderate cardiomegaly. No pericardial effusion. Central pulmonary arteries are opacified to 3 minute 39 Hounsfield units. Study mildly limited by respiratory motion. The study is negative for pulmonary embolism. Mediastinum/Nodes: Mildly patulous esophagus in the setting of small hiatal hernia without change. No acute process in the mediastinum. No signs of adenopathy in the chest. Lungs/Pleura: No effusion. No consolidative changes. Airways are patent. No pneumothorax. Mild subpleural reticulation and septal thickening at the lung bases. Juxta pleural nodule in the LEFT upper lobe measuring 5 x 3 mm is unchanged since 2019 and considered benign. Musculoskeletal: No chest wall mass. No acute musculoskeletal findings  in the chest. Spinal degenerative changes. Review of the MIP images confirms the above findings. CT ABDOMEN and PELVIS FINDINGS Hepatobiliary: No focal, suspicious hepatic lesion. No pericholecystic stranding. No biliary duct dilation. Portal vein is patent. Pancreas: Normal, without mass, inflammation or ductal dilatation. Spleen: Normal. Adrenals/Urinary Tract: Adrenal glands are normal. Renal cortical scarring bilaterally. No hydronephrosis. No perinephric stranding. No suspicious renal lesion. Bilateral renal cysts largest on the RIGHT in the upper pole at 16 Hounsfield units measuring 3.3 x 2.6 cm and in the lower pole on the LEFT measuring 1.8 cm greatest axial dimension. Urinary bladder without adjacent stranding. No ureteral dilation. No nephrolithiasis. Stomach/Bowel: Small to moderate hiatal hernia. No sign of bowel obstruction. No acute bowel process. Normal appendix. Pancolonic diverticulosis is mild and more pronounced along the RIGHT colon. Vascular/Lymphatic: Aortic atherosclerosis. No sign of aneurysm. Smooth contour of the IVC. There is no gastrohepatic or hepatoduodenal ligament lymphadenopathy. No retroperitoneal or mesenteric lymphadenopathy. No pelvic sidewall lymphadenopathy. Reproductive: Fiducial markers about the prostate. Other: No ascites Musculoskeletal: No acute bone finding. No destructive bone process. Spinal degenerative changes. Review of the MIP images confirms the above findings. IMPRESSION: 1. Study is negative for pulmonary embolism. 2. No acute cardiopulmonary process with aortic atherosclerosis both calcified and noncalcified as before. 3. Moderate cardiomegaly. 4. Small to moderate hiatal hernia. 5. Mild predominantly RIGHT-sided diverticulosis. 6. Signs of prior prostate radiation. Aortic Atherosclerosis (ICD10-I70.0). Electronically Signed   By: Zetta Bills M.D.   On: 05/29/2022 18:12   CT ABDOMEN PELVIS W CONTRAST  Result Date: 05/29/2022 CLINICAL DATA:  Chest pain.  Suspected ascites and abdominal mass. EXAM: CT ANGIOGRAPHY CHEST CT ABDOMEN AND PELVIS WITH CONTRAST TECHNIQUE: Multidetector CT imaging of the chest was performed using the standard protocol during bolus administration of intravenous contrast. Multiplanar CT image reconstructions and MIPs were obtained to evaluate the vascular anatomy. Multidetector CT imaging of the abdomen and pelvis was performed using the standard protocol during bolus administration of intravenous contrast. RADIATION DOSE REDUCTION: This exam was performed according to the departmental dose-optimization program which includes automated exposure control, adjustment of the mA and/or kV according to patient size and/or use of iterative reconstruction technique. Unless otherwise stated, incidental findings do not required dedicated follow-up imaging. CONTRAST:  57mL OMNIPAQUE IOHEXOL 350 MG/ML SOLN COMPARISON:  July 25, 2018. FINDINGS: CTA CHEST FINDINGS Cardiovascular: Aortic atherosclerosis both calcified and noncalcified in the thoracic aorta. No sign of aneurysmal dilation. Three-vessel branching pattern in the chest. Moderate cardiomegaly. No pericardial effusion. Central pulmonary arteries are  opacified to 3 minute 39 Hounsfield units. Study mildly limited by respiratory motion. The study is negative for pulmonary embolism. Mediastinum/Nodes: Mildly patulous esophagus in the setting of small hiatal hernia without change. No acute process in the mediastinum. No signs of adenopathy in the chest. Lungs/Pleura: No effusion. No consolidative changes. Airways are patent. No pneumothorax. Mild subpleural reticulation and septal thickening at the lung bases. Juxta pleural nodule in the LEFT upper lobe measuring 5 x 3 mm is unchanged since 2019 and considered benign. Musculoskeletal: No chest wall mass. No acute musculoskeletal findings in the chest. Spinal degenerative changes. Review of the MIP images confirms the above findings. CT ABDOMEN and  PELVIS FINDINGS Hepatobiliary: No focal, suspicious hepatic lesion. No pericholecystic stranding. No biliary duct dilation. Portal vein is patent. Pancreas: Normal, without mass, inflammation or ductal dilatation. Spleen: Normal. Adrenals/Urinary Tract: Adrenal glands are normal. Renal cortical scarring bilaterally. No hydronephrosis. No perinephric stranding. No suspicious renal lesion. Bilateral renal cysts largest on the RIGHT in the upper pole at 16 Hounsfield units measuring 3.3 x 2.6 cm and in the lower pole on the LEFT measuring 1.8 cm greatest axial dimension. Urinary bladder without adjacent stranding. No ureteral dilation. No nephrolithiasis. Stomach/Bowel: Small to moderate hiatal hernia. No sign of bowel obstruction. No acute bowel process. Normal appendix. Pancolonic diverticulosis is mild and more pronounced along the RIGHT colon. Vascular/Lymphatic: Aortic atherosclerosis. No sign of aneurysm. Smooth contour of the IVC. There is no gastrohepatic or hepatoduodenal ligament lymphadenopathy. No retroperitoneal or mesenteric lymphadenopathy. No pelvic sidewall lymphadenopathy. Reproductive: Fiducial markers about the prostate. Other: No ascites Musculoskeletal: No acute bone finding. No destructive bone process. Spinal degenerative changes. Review of the MIP images confirms the above findings. IMPRESSION: 1. Study is negative for pulmonary embolism. 2. No acute cardiopulmonary process with aortic atherosclerosis both calcified and noncalcified as before. 3. Moderate cardiomegaly. 4. Small to moderate hiatal hernia. 5. Mild predominantly RIGHT-sided diverticulosis. 6. Signs of prior prostate radiation. Aortic Atherosclerosis (ICD10-I70.0). Electronically Signed   By: Zetta Bills M.D.   On: 05/29/2022 18:12   DG Chest 2 View  Result Date: 05/29/2022 CLINICAL DATA:  Chest pain. EXAM: CHEST - 2 VIEW COMPARISON:  12/07/2021 FINDINGS: The cardiopericardial silhouette is within normal limits for size.  There is pulmonary vascular congestion without overt pulmonary edema. Small pulmonary nodule noted right lung base, not seen on the previous study. No evidence for focal airspace consolidation or pleural effusion. The visualized bony structures of the thorax are unremarkable. IMPRESSION: 1. Pulmonary vascular congestion without overt pulmonary edema. 2. Small pulmonary nodule right lung base. Chest CT without contrast recommended to further evaluate. Electronically Signed   By: Misty Stanley M.D.   On: 05/29/2022 13:46    Pertinent labs & imaging results that were available during my care of the patient were reviewed by me and considered in my medical decision making (see MDM for details).  Medications Ordered in ED Medications  iohexol (OMNIPAQUE) 350 MG/ML injection 100 mL (75 mLs Intravenous Contrast Given 05/29/22 1717)  Procedures Procedures  (including critical care time)  Medical Decision Making / ED Course   This patient presents to the ED for concern of chest tightness, this involves an extensive number of treatment options, and is a complaint that carries with it a high risk of complications and morbidity.  The differential diagnosis includes ACS, low-flow state during dialysis, PE, pneumonia, COPD, reactive airway disease, cirrhosis, obstruction  MDM: Patient seen in the emergency room for evaluation of chest tightness.  Physical exam with abdominal distention but is otherwise unremarkable.  Laboratory evaluation with a creatinine of 5.57 and a BUN of 38 consistent with his ESRD, hemoglobin 12.1 which is baseline.  Initial troponin elevated to 27 but delta troponin stable at 25.  Hepatic function panel unremarkable.  Initial chest x-ray with potential new nodule with radiology request for contrasted CT imaging.  I obtained an additional CT abdomen pelvis on  top of a PE study today due to my physical exam findings of abdominal distention.  Imaging negative for PE with overall no acute cardiopulmonary process, moderate cardiomegaly with a small to moderate hiatal hernia.  The previous nodule in question in the right lung base was not commented on.  With stable troponins likely elevated in the setting of decreased renal clearance, no active chest pain here in the emergency department today, nonischemic EKG, we shared decision making and the patient will be discharged with outpatient follow-up.  He was given strict return precautions which he voiced understanding he was discharged.   Additional history obtained: -Additional history obtained from son -External records from outside source obtained and reviewed including: Chart review including previous notes, labs, imaging, consultation notes   Lab Tests: -I ordered, reviewed, and interpreted labs.   The pertinent results include:   Labs Reviewed  BASIC METABOLIC PANEL - Abnormal; Notable for the following components:      Result Value   Glucose, Bld 109 (*)    BUN 38 (*)    Creatinine, Ser 5.57 (*)    GFR, Estimated 9 (*)    All other components within normal limits  CBC - Abnormal; Notable for the following components:   Hemoglobin 12.1 (*)    HCT 36.3 (*)    All other components within normal limits  TROPONIN I (HIGH SENSITIVITY) - Abnormal; Notable for the following components:   Troponin I (High Sensitivity) 27 (*)    All other components within normal limits  TROPONIN I (HIGH SENSITIVITY) - Abnormal; Notable for the following components:   Troponin I (High Sensitivity) 25 (*)    All other components within normal limits  HEPATIC FUNCTION PANEL      EKG   EKG Interpretation  Date/Time:  Thursday May 29 2022 13:20:59 EDT Ventricular Rate:  61 PR Interval:  174 QRS Duration: 108 QT Interval:  398 QTC Calculation: 400 R Axis:   -44 Text Interpretation: Normal sinus rhythm Left  axis deviation Moderate voltage criteria for LVH, may be normal variant ( R in aVL , Cornell product ) No significant change since last tracing When compared with ECG of 17-Apr-2020 19:58, Premature atrial complexes are no longer Present Nonspecific T wave abnormality, improved in Inferior leads Confirmed by [place] ([id number]) on 05/29/2022 6:54:58 PM         Imaging Studies ordered: I ordered imaging studies including chest x-ray, CT PE, CT [facility] I independently visualized and interpreted imaging. I agree with the radiologist interpretation   Medicines ordered and prescription drug management: Meds ordered this encounter  Medications   iohexol (OMNIPAQUE) 350 MG/ML injection 100 mL    -I have reviewed the patients home medicines and have made adjustments as needed  Critical interventions none  Cardiac Monitoring: The patient was maintained on a cardiac monitor.  I personally viewed and interpreted the cardiac monitored which showed an underlying rhythm of: NSR  Social Determinants of Health:  Factors impacting patients care include: Previous history of alcohol use, previous long-distance trucker   Reevaluation: After the interventions noted above, I reevaluated the patient and found that they have :improved  Co morbidities that complicate the patient evaluation  Past Medical History:  Diagnosis Date   Anemia    Arthritis    CKD (chronic kidney disease) stage 4, GFR 15-29 ml/min (HCC)    Essential hypertension    GERD (gastroesophageal reflux disease)    Hyperlipidemia    Prostate cancer ([place])    took radiation   Renal insufficiency    Type 2 diabetes mellitus ([place])       Dispostion: I considered admission for this patient, but he currently has a reassuringly negative work-up and as he is asymptomatic here in the emergency department we have shared decision making to discharge patient with outpatient follow-up.     Final Clinical Impression(s) / ED  Diagnoses Final diagnoses:  Atypical chest pain     _0 @    Teressa Lower, MD 05/29/22 2329

## 2022-05-29 NOTE — ED Notes (Signed)
Patient transported to CT 

## 2022-05-29 NOTE — ED Triage Notes (Addendum)
Pt BIB [facility] for evaluation of chest pain, per pt his was at his dialysis session and a PA ask how he was doing, he said he was having some chest tightness, next thing he's knows ems was bringing him here, didn't complete dialysis session. Given 324 ASA, 1 nitro by EMS.

## 2022-05-31 DIAGNOSIS — Z992 Dependence on renal dialysis: Secondary | ICD-10-CM | POA: Diagnosis not present

## 2022-05-31 DIAGNOSIS — N186 End stage renal disease: Secondary | ICD-10-CM | POA: Diagnosis not present

## 2022-06-03 DIAGNOSIS — Z992 Dependence on renal dialysis: Secondary | ICD-10-CM | POA: Diagnosis not present

## 2022-06-03 DIAGNOSIS — N186 End stage renal disease: Secondary | ICD-10-CM | POA: Diagnosis not present

## 2022-06-07 DIAGNOSIS — Z992 Dependence on renal dialysis: Secondary | ICD-10-CM | POA: Diagnosis not present

## 2022-06-07 DIAGNOSIS — N186 End stage renal disease: Secondary | ICD-10-CM | POA: Diagnosis not present

## 2022-06-10 DIAGNOSIS — N186 End stage renal disease: Secondary | ICD-10-CM | POA: Diagnosis not present

## 2022-06-10 DIAGNOSIS — Z992 Dependence on renal dialysis: Secondary | ICD-10-CM | POA: Diagnosis not present

## 2022-06-12 DIAGNOSIS — Z992 Dependence on renal dialysis: Secondary | ICD-10-CM | POA: Diagnosis not present

## 2022-06-12 DIAGNOSIS — N186 End stage renal disease: Secondary | ICD-10-CM | POA: Diagnosis not present

## 2022-06-14 DIAGNOSIS — N186 End stage renal disease: Secondary | ICD-10-CM | POA: Diagnosis not present

## 2022-06-14 DIAGNOSIS — Z992 Dependence on renal dialysis: Secondary | ICD-10-CM | POA: Diagnosis not present

## 2022-06-17 DIAGNOSIS — N186 End stage renal disease: Secondary | ICD-10-CM | POA: Diagnosis not present

## 2022-06-17 DIAGNOSIS — Z992 Dependence on renal dialysis: Secondary | ICD-10-CM | POA: Diagnosis not present

## 2022-06-19 DIAGNOSIS — N186 End stage renal disease: Secondary | ICD-10-CM | POA: Diagnosis not present

## 2022-06-19 DIAGNOSIS — Z992 Dependence on renal dialysis: Secondary | ICD-10-CM | POA: Diagnosis not present

## 2022-06-21 DIAGNOSIS — Z992 Dependence on renal dialysis: Secondary | ICD-10-CM | POA: Diagnosis not present

## 2022-06-21 DIAGNOSIS — N186 End stage renal disease: Secondary | ICD-10-CM | POA: Diagnosis not present

## 2022-06-24 DIAGNOSIS — N186 End stage renal disease: Secondary | ICD-10-CM | POA: Diagnosis not present

## 2022-06-24 DIAGNOSIS — Z992 Dependence on renal dialysis: Secondary | ICD-10-CM | POA: Diagnosis not present

## 2022-06-26 DIAGNOSIS — N186 End stage renal disease: Secondary | ICD-10-CM | POA: Diagnosis not present

## 2022-06-26 DIAGNOSIS — Z992 Dependence on renal dialysis: Secondary | ICD-10-CM | POA: Diagnosis not present

## 2022-06-28 DIAGNOSIS — N186 End stage renal disease: Secondary | ICD-10-CM | POA: Diagnosis not present

## 2022-06-28 DIAGNOSIS — Z992 Dependence on renal dialysis: Secondary | ICD-10-CM | POA: Diagnosis not present

## 2022-06-30 DIAGNOSIS — M1990 Unspecified osteoarthritis, unspecified site: Secondary | ICD-10-CM | POA: Diagnosis not present

## 2022-06-30 DIAGNOSIS — Z23 Encounter for immunization: Secondary | ICD-10-CM | POA: Diagnosis not present

## 2022-06-30 DIAGNOSIS — Z992 Dependence on renal dialysis: Secondary | ICD-10-CM | POA: Diagnosis not present

## 2022-06-30 DIAGNOSIS — E1142 Type 2 diabetes mellitus with diabetic polyneuropathy: Secondary | ICD-10-CM | POA: Diagnosis not present

## 2022-06-30 DIAGNOSIS — I10 Essential (primary) hypertension: Secondary | ICD-10-CM | POA: Diagnosis not present

## 2022-07-01 DIAGNOSIS — N186 End stage renal disease: Secondary | ICD-10-CM | POA: Diagnosis not present

## 2022-07-01 DIAGNOSIS — Z992 Dependence on renal dialysis: Secondary | ICD-10-CM | POA: Diagnosis not present

## 2022-07-03 DIAGNOSIS — N186 End stage renal disease: Secondary | ICD-10-CM | POA: Diagnosis not present

## 2022-07-03 DIAGNOSIS — Z992 Dependence on renal dialysis: Secondary | ICD-10-CM | POA: Diagnosis not present

## 2022-07-05 DIAGNOSIS — Z992 Dependence on renal dialysis: Secondary | ICD-10-CM | POA: Diagnosis not present

## 2022-07-05 DIAGNOSIS — N186 End stage renal disease: Secondary | ICD-10-CM | POA: Diagnosis not present

## 2022-07-08 DIAGNOSIS — Z992 Dependence on renal dialysis: Secondary | ICD-10-CM | POA: Diagnosis not present

## 2022-07-08 DIAGNOSIS — N186 End stage renal disease: Secondary | ICD-10-CM | POA: Diagnosis not present

## 2022-07-10 DIAGNOSIS — N186 End stage renal disease: Secondary | ICD-10-CM | POA: Diagnosis not present

## 2022-07-10 DIAGNOSIS — Z992 Dependence on renal dialysis: Secondary | ICD-10-CM | POA: Diagnosis not present

## 2022-07-12 DIAGNOSIS — Z992 Dependence on renal dialysis: Secondary | ICD-10-CM | POA: Diagnosis not present

## 2022-07-12 DIAGNOSIS — N186 End stage renal disease: Secondary | ICD-10-CM | POA: Diagnosis not present

## 2022-07-15 DIAGNOSIS — N186 End stage renal disease: Secondary | ICD-10-CM | POA: Diagnosis not present

## 2022-07-15 DIAGNOSIS — Z992 Dependence on renal dialysis: Secondary | ICD-10-CM | POA: Diagnosis not present

## 2022-07-17 DIAGNOSIS — N186 End stage renal disease: Secondary | ICD-10-CM | POA: Diagnosis not present

## 2022-07-17 DIAGNOSIS — Z992 Dependence on renal dialysis: Secondary | ICD-10-CM | POA: Diagnosis not present

## 2022-07-18 DIAGNOSIS — E1165 Type 2 diabetes mellitus with hyperglycemia: Secondary | ICD-10-CM | POA: Diagnosis not present

## 2022-07-18 DIAGNOSIS — M1990 Unspecified osteoarthritis, unspecified site: Secondary | ICD-10-CM | POA: Diagnosis not present

## 2022-07-18 DIAGNOSIS — Z992 Dependence on renal dialysis: Secondary | ICD-10-CM | POA: Diagnosis not present

## 2022-07-18 DIAGNOSIS — M79672 Pain in left foot: Secondary | ICD-10-CM | POA: Diagnosis not present

## 2022-07-18 DIAGNOSIS — K219 Gastro-esophageal reflux disease without esophagitis: Secondary | ICD-10-CM | POA: Diagnosis not present

## 2022-07-18 DIAGNOSIS — R1310 Dysphagia, unspecified: Secondary | ICD-10-CM | POA: Diagnosis not present

## 2022-07-19 DIAGNOSIS — N186 End stage renal disease: Secondary | ICD-10-CM | POA: Diagnosis not present

## 2022-07-19 DIAGNOSIS — Z992 Dependence on renal dialysis: Secondary | ICD-10-CM | POA: Diagnosis not present

## 2022-07-22 DIAGNOSIS — N186 End stage renal disease: Secondary | ICD-10-CM | POA: Diagnosis not present

## 2022-07-22 DIAGNOSIS — Z992 Dependence on renal dialysis: Secondary | ICD-10-CM | POA: Diagnosis not present

## 2022-07-24 DIAGNOSIS — N186 End stage renal disease: Secondary | ICD-10-CM | POA: Diagnosis not present

## 2022-07-24 DIAGNOSIS — Z992 Dependence on renal dialysis: Secondary | ICD-10-CM | POA: Diagnosis not present

## 2022-07-26 DIAGNOSIS — Z992 Dependence on renal dialysis: Secondary | ICD-10-CM | POA: Diagnosis not present

## 2022-07-26 DIAGNOSIS — N186 End stage renal disease: Secondary | ICD-10-CM | POA: Diagnosis not present

## 2022-07-29 DIAGNOSIS — N186 End stage renal disease: Secondary | ICD-10-CM | POA: Diagnosis not present

## 2022-07-29 DIAGNOSIS — Z992 Dependence on renal dialysis: Secondary | ICD-10-CM | POA: Diagnosis not present

## 2022-07-31 DIAGNOSIS — Z992 Dependence on renal dialysis: Secondary | ICD-10-CM | POA: Diagnosis not present

## 2022-07-31 DIAGNOSIS — N186 End stage renal disease: Secondary | ICD-10-CM | POA: Diagnosis not present

## 2022-08-02 DIAGNOSIS — Z992 Dependence on renal dialysis: Secondary | ICD-10-CM | POA: Diagnosis not present

## 2022-08-02 DIAGNOSIS — N186 End stage renal disease: Secondary | ICD-10-CM | POA: Diagnosis not present

## 2022-08-05 DIAGNOSIS — N186 End stage renal disease: Secondary | ICD-10-CM | POA: Diagnosis not present

## 2022-08-05 DIAGNOSIS — Z992 Dependence on renal dialysis: Secondary | ICD-10-CM | POA: Diagnosis not present

## 2022-08-07 ENCOUNTER — Encounter: Payer: Self-pay | Admitting: Internal Medicine

## 2022-08-07 DIAGNOSIS — Z992 Dependence on renal dialysis: Secondary | ICD-10-CM | POA: Diagnosis not present

## 2022-08-07 DIAGNOSIS — N186 End stage renal disease: Secondary | ICD-10-CM | POA: Diagnosis not present

## 2022-08-09 DIAGNOSIS — Z992 Dependence on renal dialysis: Secondary | ICD-10-CM | POA: Diagnosis not present

## 2022-08-09 DIAGNOSIS — N186 End stage renal disease: Secondary | ICD-10-CM | POA: Diagnosis not present

## 2022-08-12 DIAGNOSIS — Z992 Dependence on renal dialysis: Secondary | ICD-10-CM | POA: Diagnosis not present

## 2022-08-12 DIAGNOSIS — N186 End stage renal disease: Secondary | ICD-10-CM | POA: Diagnosis not present

## 2022-08-14 DIAGNOSIS — Z992 Dependence on renal dialysis: Secondary | ICD-10-CM | POA: Diagnosis not present

## 2022-08-14 DIAGNOSIS — N186 End stage renal disease: Secondary | ICD-10-CM | POA: Diagnosis not present

## 2022-08-16 DIAGNOSIS — Z992 Dependence on renal dialysis: Secondary | ICD-10-CM | POA: Diagnosis not present

## 2022-08-16 DIAGNOSIS — N186 End stage renal disease: Secondary | ICD-10-CM | POA: Diagnosis not present

## 2022-08-19 DIAGNOSIS — Z992 Dependence on renal dialysis: Secondary | ICD-10-CM | POA: Diagnosis not present

## 2022-08-19 DIAGNOSIS — N186 End stage renal disease: Secondary | ICD-10-CM | POA: Diagnosis not present

## 2022-08-21 DIAGNOSIS — Z992 Dependence on renal dialysis: Secondary | ICD-10-CM | POA: Diagnosis not present

## 2022-08-21 DIAGNOSIS — N186 End stage renal disease: Secondary | ICD-10-CM | POA: Diagnosis not present

## 2022-08-23 DIAGNOSIS — Z992 Dependence on renal dialysis: Secondary | ICD-10-CM | POA: Diagnosis not present

## 2022-08-23 DIAGNOSIS — N186 End stage renal disease: Secondary | ICD-10-CM | POA: Diagnosis not present

## 2022-08-26 ENCOUNTER — Ambulatory Visit: Payer: Medicare Other | Admitting: Gastroenterology

## 2022-08-26 DIAGNOSIS — Z992 Dependence on renal dialysis: Secondary | ICD-10-CM | POA: Diagnosis not present

## 2022-08-26 DIAGNOSIS — N186 End stage renal disease: Secondary | ICD-10-CM | POA: Diagnosis not present

## 2022-08-28 DIAGNOSIS — N186 End stage renal disease: Secondary | ICD-10-CM | POA: Diagnosis not present

## 2022-08-28 DIAGNOSIS — Z992 Dependence on renal dialysis: Secondary | ICD-10-CM | POA: Diagnosis not present

## 2022-08-30 DIAGNOSIS — Z992 Dependence on renal dialysis: Secondary | ICD-10-CM | POA: Diagnosis not present

## 2022-08-30 DIAGNOSIS — N186 End stage renal disease: Secondary | ICD-10-CM | POA: Diagnosis not present

## 2022-09-02 DIAGNOSIS — Z992 Dependence on renal dialysis: Secondary | ICD-10-CM | POA: Diagnosis not present

## 2022-09-02 DIAGNOSIS — N186 End stage renal disease: Secondary | ICD-10-CM | POA: Diagnosis not present

## 2022-09-05 DIAGNOSIS — N186 End stage renal disease: Secondary | ICD-10-CM | POA: Diagnosis not present

## 2022-09-05 DIAGNOSIS — Z992 Dependence on renal dialysis: Secondary | ICD-10-CM | POA: Diagnosis not present

## 2022-09-06 DIAGNOSIS — Z992 Dependence on renal dialysis: Secondary | ICD-10-CM | POA: Diagnosis not present

## 2022-09-06 DIAGNOSIS — N186 End stage renal disease: Secondary | ICD-10-CM | POA: Diagnosis not present

## 2022-09-09 DIAGNOSIS — Z992 Dependence on renal dialysis: Secondary | ICD-10-CM | POA: Diagnosis not present

## 2022-09-09 DIAGNOSIS — N186 End stage renal disease: Secondary | ICD-10-CM | POA: Diagnosis not present

## 2022-09-11 DIAGNOSIS — Z992 Dependence on renal dialysis: Secondary | ICD-10-CM | POA: Diagnosis not present

## 2022-09-11 DIAGNOSIS — N186 End stage renal disease: Secondary | ICD-10-CM | POA: Diagnosis not present

## 2022-09-13 DIAGNOSIS — N186 End stage renal disease: Secondary | ICD-10-CM | POA: Diagnosis not present

## 2022-09-13 DIAGNOSIS — Z992 Dependence on renal dialysis: Secondary | ICD-10-CM | POA: Diagnosis not present

## 2022-09-16 DIAGNOSIS — Z992 Dependence on renal dialysis: Secondary | ICD-10-CM | POA: Diagnosis not present

## 2022-09-16 DIAGNOSIS — N186 End stage renal disease: Secondary | ICD-10-CM | POA: Diagnosis not present

## 2022-09-17 DIAGNOSIS — Z992 Dependence on renal dialysis: Secondary | ICD-10-CM | POA: Diagnosis not present

## 2022-09-17 DIAGNOSIS — I10 Essential (primary) hypertension: Secondary | ICD-10-CM | POA: Diagnosis not present

## 2022-09-17 DIAGNOSIS — E1165 Type 2 diabetes mellitus with hyperglycemia: Secondary | ICD-10-CM | POA: Diagnosis not present

## 2022-09-18 DIAGNOSIS — N186 End stage renal disease: Secondary | ICD-10-CM | POA: Diagnosis not present

## 2022-09-18 DIAGNOSIS — Z992 Dependence on renal dialysis: Secondary | ICD-10-CM | POA: Diagnosis not present

## 2022-09-20 DIAGNOSIS — N186 End stage renal disease: Secondary | ICD-10-CM | POA: Diagnosis not present

## 2022-09-20 DIAGNOSIS — Z992 Dependence on renal dialysis: Secondary | ICD-10-CM | POA: Diagnosis not present

## 2022-09-22 ENCOUNTER — Encounter: Payer: Self-pay | Admitting: Gastroenterology

## 2022-09-22 ENCOUNTER — Ambulatory Visit (INDEPENDENT_AMBULATORY_CARE_PROVIDER_SITE_OTHER): Payer: Medicare Other | Admitting: Gastroenterology

## 2022-09-22 ENCOUNTER — Encounter: Payer: Self-pay | Admitting: *Deleted

## 2022-09-22 VITALS — BP 187/75 | HR 71 | Temp 97.8°F | Ht 65.0 in | Wt 173.4 lb

## 2022-09-22 DIAGNOSIS — R1319 Other dysphagia: Secondary | ICD-10-CM

## 2022-09-22 DIAGNOSIS — K449 Diaphragmatic hernia without obstruction or gangrene: Secondary | ICD-10-CM

## 2022-09-22 DIAGNOSIS — R1310 Dysphagia, unspecified: Secondary | ICD-10-CM | POA: Insufficient documentation

## 2022-09-22 DIAGNOSIS — K219 Gastro-esophageal reflux disease without esophagitis: Secondary | ICD-10-CM

## 2022-09-22 MED ORDER — PANTOPRAZOLE SODIUM 40 MG PO TBEC: 40.0000 mg | DELAYED_RELEASE_TABLET | Freq: Every day | ORAL | 1 refills | Status: DC

## 2022-09-22 NOTE — Patient Instructions (Signed)
Please monitor your blood pressure at home, if it does not improve (less than 150/90) then contact your PCP.  Start pantoprazole '40mg'$  daily before breakfast for acid reflux. Upper endoscopy to be scheduled. See separate instructions.

## 2022-09-22 NOTE — Progress Notes (Signed)
GI Office Note    Referring Provider: Carrolyn Meiers* Primary Care Physician:  Carrolyn Meiers, MD  Primary Gastroenterologist: Garfield Cornea, MD   Chief Complaint   Chief Complaint  Patient presents with   Dysphagia    Has issues with food/pills getting stuck at times. Also states that stuff will come back up in his throat at times.      History of Present Illness   Steve [name] is a 86 y.o. male presenting today at the request of Dr. Legrand Rams for further evaluation of dysphagia.   Patient states he has had symptoms for about six months. At times, feels like pills and solids get stuck when he swallows. He also feels like stuff comes back up in his throat. He has to spit it out. Seems to have a lot of saliva too. States he got his teeth cleaned recently wondering if that was source of excess saliva. He has bad heartburn, frequently. Taking over the counter chewable antacids several times per day. Some nausea. No vomiting. No abdominal pain. Says he has a hernia in his stomach and sometimes stomach is hard. BM every other day. MOM at times. No melena, brbpr.   CT angio chest August 2023: Mildly patulous esophagus in the setting of small hiatal hernia without change compared to 2019.  Negative for pulmonary embolus.  Moderate cardiomegaly.  CT abdomen pelvis August 2023: Mild to moderate hiatal hernia, pancolonic diverticulosis.  Signs of prior prostate radiation.   Medications   Current Outpatient Medications  Medication Sig Dispense Refill   Accu-Chek Softclix Lancets lancets      aspirin 81 MG tablet Take 81 mg by mouth daily.     gabapentin (NEURONTIN) 300 MG capsule Take 300 mg by mouth in the morning.     glucose blood (ACCU-CHEK AVIVA PLUS) test strip      hydrALAZINE (APRESOLINE) 25 MG tablet Take 25 mg by mouth 3 (three) times daily.     Lancets Misc. (ACCU-CHEK SOFTCLIX LANCET DEV) KIT      linagliptin (TRADJENTA) 5 MG TABS tablet Take 5 mg by  mouth daily.     metoprolol tartrate (LOPRESSOR) 25 MG tablet Take 1 tablet (25 mg total) by mouth 2 (two) times daily.     Multiple Vitamin (MULTIVITAMIN WITH MINERALS) TABS tablet Take 1 tablet by mouth daily.     NIFEdipine (PROCARDIA XL/ADALAT-CC) 90 MG 24 hr tablet TAKE (1) TABLET BY MOUTH ONCE DAILY. (Patient taking differently: Take 90 mg by mouth daily before breakfast.) 30 tablet 6   tamsulosin (FLOMAX) 0.4 MG CAPS capsule Take by mouth daily.     zolpidem (AMBIEN) 5 MG tablet Take 5 mg by mouth at bedtime as needed for sleep.     No current facility-administered medications for this visit.    Allergies   Allergies as of 09/22/2022   (No Known Allergies)    Past Medical History   Past Medical History:  Diagnosis Date   Anemia    Arthritis    ESRD (end stage renal disease) on dialysis [facility])    T/TH/Sat   Essential hypertension    GERD (gastroesophageal reflux disease)    Hyperlipidemia    Prostate cancer ([place])    took radiation   Type 2 diabetes mellitus ([place])     Past Surgical History   Past Surgical History:  Procedure Laterality Date   ANTERIOR CERVICAL DECOMP/DISCECTOMY FUSION N/A 08/23/2020   Procedure: Cervical 3-4 Anterior cervical decompression/discectomy/fusion;  Surgeon: Vertell Limber,  Joseph, MD;  Location: [facility] OR;  Service: Neurosurgery;  Laterality: N/A;  3C/RM 21 to follow   AV FISTULA PLACEMENT Left 02/21/2021   Procedure: INSERTION OF ARTERIOVENOUS GORE-TEX GRAFT LEFT ARM;  Surgeon: Early, Todd F, MD;  Location: [facility];  Service: Vascular;  Laterality: Left;   CATARACT EXTRACTION W/PHACO Right 04/28/2016   Procedure: CATARACT EXTRACTION PHACO AND INTRAOCULAR LENS PLACEMENT (IOC);  Surgeon: Carroll F Haines, MD;  Location: [facility]S;  Service: Ophthalmology;  Laterality: Right;  CDE: 4.28   COLONOSCOPY     about 2 years in [place] around 2016   CYSTECTOMY     pt. denies   IR FLUORO GUIDE CV LINE LEFT  12/20/2020   IR US GUIDE VASC ACCESS LEFT  12/20/2020     Past Family History   Family History  Problem Relation Age of Onset   Heart disease Mother        cause of death   Diabetes Sister    Colon cancer Neg Hx     Past Social History   Social History   Socioeconomic History   Marital status: Divorced    Spouse name: Not on file   Number of children: Not on file   Years of education: Not on file   Highest education level: Not on file  Occupational History   Occupation: Retired  Tobacco Use   Smoking status: Former    Packs/day: 2.00    Years: 25.00    Total pack years: 50.00    Types: Pipe, Cigars, Cigarettes    Start date: 11/08/1954    Quit date: 10/14/1987    Years since quitting: 34.9   Smokeless tobacco: Never   Tobacco comments:    About 2 pipes a day  Vaping Use   Vaping Use: Never used  Substance and Sexual Activity   Alcohol use: No    Alcohol/week: 0.0 standard drinks of alcohol   Drug use: No   Sexual activity: Not Currently  Other Topics Concern   Not on file  Social History Narrative   Not on file   Social Determinants of Health   Financial Resource Strain: Not on file  Food Insecurity: Not on file  Transportation Needs: Not on file  Physical Activity: Not on file  Stress: Not on file  Social Connections: Not on file  Intimate Partner Violence: Not on file    Review of Systems   General: Negative for anorexia, weight loss, fever, chills, fatigue, weakness. Eyes: Negative for vision changes.  ENT: Negative for hoarseness,  nasal congestion. See hpi CV: Negative for chest pain, angina, palpitations, dyspnea on exertion, peripheral edema.  Respiratory: Negative for dyspnea at rest, dyspnea on exertion, cough, sputum, wheezing.  GI: See history of present illness. GU:  Negative for dysuria, hematuria, urinary incontinence, urinary frequency, nocturnal urination.  MS: + for joint pain, low back pain.  Derm: Negative for rash or itching.  Neuro: Negative for weakness, abnormal sensation,  seizure, frequent headaches, memory loss,  confusion.  Psych: Negative for anxiety, depression, suicidal ideation, hallucinations.  Endo: Negative for unusual weight change.  Heme: Negative for bruising or bleeding. Allergy: Negative for rash or hives.  Physical Exam   BP (!) 187/75 (BP Location: Right Arm, Patient Position: Sitting, Cuff Size: Normal)   Pulse 71   Temp 97.8 F (36.6 C) (Oral)   Ht 5' 5" (1.651 m)   Wt 173 lb 6.4 oz (78.7 kg)   SpO2 100%   BMI 28.86 kg/m      General: Well-nourished, well-developed in no acute distress.  Head: Normocephalic, atraumatic.   Eyes: Conjunctiva pink, no icterus. Mouth: Oropharyngeal mucosa moist and pink  Neck: Supple without thyromegaly, masses, or lymphadenopathy.  Lungs: Clear to auscultation bilaterally.  Heart: Regular rate and rhythm, no murmurs rubs or gallops.  Abdomen: Bowel sounds are normal, nontender, nondistended, no hepatosplenomegaly or masses,  no abdominal bruits, no rebound or guarding.  +rectus diastasis. Rectal: not performed Extremities: No lower extremity edema. No clubbing or deformities.  Neuro: Alert and oriented x 4 , grossly normal neurologically.  Skin: Warm and dry, no rash or jaundice.   Psych: Alert and cooperative, normal mood and affect.  Labs   Lab Results  Component Value Date   CREATININE 5.57 (H) 05/29/2022   BUN 38 (H) 05/29/2022   NA 135 05/29/2022   K 4.7 05/29/2022   CL 99 05/29/2022   CO2 28 05/29/2022   Lab Results  Component Value Date   WBC 6.1 05/29/2022   HGB 12.1 (L) 05/29/2022   HCT 36.3 (L) 05/29/2022   MCV 82.3 05/29/2022   PLT 214 05/29/2022     Lab Results  Component Value Date   ALT 16 05/29/2022   AST 22 05/29/2022   ALKPHOS 75 05/29/2022   BILITOT 0.4 05/29/2022     Imaging Studies   No results found.  Assessment   GERD/Esophageal Dysphagia/Hiatal hernia: presents with poorly controlled reflux uses otc antacids. His dysphagia could be secondary to  esophageal web/ring/stricture, esophagitis, or his hiatal hernia. Would recommend EGD with possible esophageal dilation in the near future.   He did not take his BP medications this morning. We had him take while in the office. BP came down a bit while present.   The patient was found to have elevated blood pressure when vital signs were checked in the office. The blood pressure was rechecked by the nursing staff and it was found be persistently elevated >140/90 mmHg. I personally advised to the patient to follow up closely with his PCP for hypertension control.   PLAN   Start pantoprazole 40mg daily before breakfast.  EGD/ED with Dr. Rourk. ASA 3.  I have discussed the risks, alternatives, benefits with regards to but not limited to the risk of reaction to medication, bleeding, infection, perforation and the patient is agreeable to proceed. Written consent to be obtained.    Leslie S. Lewis, MHS, PA-C [facility]  

## 2022-09-22 NOTE — H&P (View-Only) (Signed)
   GI Office Note    Referring Provider: Fanta, Tesfaye Demissie* Primary Care Physician:  Fanta, Tesfaye Demissie, MD  Primary Gastroenterologist: Michael Rourk, MD   Chief Complaint   Chief Complaint  Patient presents with   Dysphagia    Has issues with food/pills getting stuck at times. Also states that stuff will come back up in his throat at times.      History of Present Illness   Steve [name] is a 86 y.o. male presenting today at the request of Dr. Fanta for further evaluation of dysphagia.   Patient states he has had symptoms for about six months. At times, feels like pills and solids get stuck when he swallows. He also feels like stuff comes back up in his throat. He has to spit it out. Seems to have a lot of saliva too. States he got his teeth cleaned recently wondering if that was source of excess saliva. He has bad heartburn, frequently. Taking over the counter chewable antacids several times per day. Some nausea. No vomiting. No abdominal pain. Says he has a hernia in his stomach and sometimes stomach is hard. BM every other day. MOM at times. No melena, brbpr.   CT angio chest August 2023: Mildly patulous esophagus in the setting of small hiatal hernia without change compared to 2019.  Negative for pulmonary embolus.  Moderate cardiomegaly.  CT abdomen pelvis August 2023: Mild to moderate hiatal hernia, pancolonic diverticulosis.  Signs of prior prostate radiation.   Medications   Current Outpatient Medications  Medication Sig Dispense Refill   Accu-Chek Softclix Lancets lancets      aspirin 81 MG tablet Take 81 mg by mouth daily.     gabapentin (NEURONTIN) 300 MG capsule Take 300 mg by mouth in the morning.     glucose blood (ACCU-CHEK AVIVA PLUS) test strip      hydrALAZINE (APRESOLINE) 25 MG tablet Take 25 mg by mouth 3 (three) times daily.     Lancets Misc. (ACCU-CHEK SOFTCLIX LANCET DEV) KIT      linagliptin (TRADJENTA) 5 MG TABS tablet Take 5 mg by  mouth daily.     metoprolol tartrate (LOPRESSOR) 25 MG tablet Take 1 tablet (25 mg total) by mouth 2 (two) times daily.     Multiple Vitamin (MULTIVITAMIN WITH MINERALS) TABS tablet Take 1 tablet by mouth daily.     NIFEdipine (PROCARDIA XL/ADALAT-CC) 90 MG 24 hr tablet TAKE (1) TABLET BY MOUTH ONCE DAILY. (Patient taking differently: Take 90 mg by mouth daily before breakfast.) 30 tablet 6   tamsulosin (FLOMAX) 0.4 MG CAPS capsule Take by mouth daily.     zolpidem (AMBIEN) 5 MG tablet Take 5 mg by mouth at bedtime as needed for sleep.     No current facility-administered medications for this visit.    Allergies   Allergies as of 09/22/2022   (No Known Allergies)    Past Medical History   Past Medical History:  Diagnosis Date   Anemia    Arthritis    ESRD (end stage renal disease) on dialysis ([facility])    T/TH/Sat   Essential hypertension    GERD (gastroesophageal reflux disease)    Hyperlipidemia    Prostate cancer ([facility])    took radiation   Type 2 diabetes mellitus ([facility])     Past Surgical History   Past Surgical History:  Procedure Laterality Date   ANTERIOR CERVICAL DECOMP/DISCECTOMY FUSION N/A 08/23/2020   Procedure: Cervical 3-4 Anterior cervical decompression/discectomy/fusion;  Surgeon: Stern,   Joseph, MD;  Location: [facility] OR;  Service: Neurosurgery;  Laterality: N/A;  3C/RM 21 to follow   AV FISTULA PLACEMENT Left 02/21/2021   Procedure: INSERTION OF ARTERIOVENOUS GORE-TEX GRAFT LEFT ARM;  Surgeon: Early, Todd F, MD;  Location: [facility];  Service: Vascular;  Laterality: Left;   CATARACT EXTRACTION W/PHACO Right 04/28/2016   Procedure: CATARACT EXTRACTION PHACO AND INTRAOCULAR LENS PLACEMENT (IOC);  Surgeon: Carroll F Haines, MD;  Location: [facility];  Service: Ophthalmology;  Laterality: Right;  CDE: 4.28   COLONOSCOPY     about 2 years in [place] around 2016   CYSTECTOMY     pt. denies   IR FLUORO GUIDE CV LINE LEFT  12/20/2020   IR US GUIDE VASC ACCESS LEFT  12/20/2020     Past Family History   Family History  Problem Relation Age of Onset   Heart disease Mother        cause of death   Diabetes Sister    Colon cancer Neg Hx     Past Social History   Social History   Socioeconomic History   Marital status: Divorced    Spouse name: Not on file   Number of children: Not on file   Years of education: Not on file   Highest education level: Not on file  Occupational History   Occupation: Retired  Tobacco Use   Smoking status: Former    Packs/day: 2.00    Years: 25.00    Total pack years: 50.00    Types: Pipe, Cigars, Cigarettes    Start date: 11/08/1954    Quit date: 10/14/1987    Years since quitting: 34.9   Smokeless tobacco: Never   Tobacco comments:    About 2 pipes a day  Vaping Use   Vaping Use: Never used  Substance and Sexual Activity   Alcohol use: No    Alcohol/week: 0.0 standard drinks of alcohol   Drug use: No   Sexual activity: Not Currently  Other Topics Concern   Not on file  Social History Narrative   Not on file   Social Determinants of Health   Financial Resource Strain: Not on file  Food Insecurity: Not on file  Transportation Needs: Not on file  Physical Activity: Not on file  Stress: Not on file  Social Connections: Not on file  Intimate Partner Violence: Not on file    Review of Systems   General: Negative for anorexia, weight loss, fever, chills, fatigue, weakness. Eyes: Negative for vision changes.  ENT: Negative for hoarseness,  nasal congestion. See hpi CV: Negative for chest pain, angina, palpitations, dyspnea on exertion, peripheral edema.  Respiratory: Negative for dyspnea at rest, dyspnea on exertion, cough, sputum, wheezing.  GI: See history of present illness. GU:  Negative for dysuria, hematuria, urinary incontinence, urinary frequency, nocturnal urination.  MS: + for joint pain, low back pain.  Derm: Negative for rash or itching.  Neuro: Negative for weakness, abnormal sensation,  seizure, frequent headaches, memory loss,  confusion.  Psych: Negative for anxiety, depression, suicidal ideation, hallucinations.  Endo: Negative for unusual weight change.  Heme: Negative for bruising or bleeding. Allergy: Negative for rash or hives.  Physical Exam   BP (!) 187/75 (BP Location: Right Arm, Patient Position: Sitting, Cuff Size: Normal)   Pulse 71   Temp 97.8 F (36.6 C) (Oral)   Ht 5' 5" (1.651 m)   Wt 173 lb 6.4 oz (78.7 kg)   SpO2 100%   BMI 28.86 kg/m      General: Well-nourished, well-developed in no acute distress.  Head: Normocephalic, atraumatic.   Eyes: Conjunctiva pink, no icterus. Mouth: Oropharyngeal mucosa moist and pink  Neck: Supple without thyromegaly, masses, or lymphadenopathy.  Lungs: Clear to auscultation bilaterally.  Heart: Regular rate and rhythm, no murmurs rubs or gallops.  Abdomen: Bowel sounds are normal, nontender, nondistended, no hepatosplenomegaly or masses,  no abdominal bruits, no rebound or guarding.  +rectus diastasis. Rectal: not performed Extremities: No lower extremity edema. No clubbing or deformities.  Neuro: Alert and oriented x 4 , grossly normal neurologically.  Skin: Warm and dry, no rash or jaundice.   Psych: Alert and cooperative, normal mood and affect.  Labs   Lab Results  Component Value Date   CREATININE 5.57 (H) 05/29/2022   BUN 38 (H) 05/29/2022   NA 135 05/29/2022   K 4.7 05/29/2022   CL 99 05/29/2022   CO2 28 05/29/2022   Lab Results  Component Value Date   WBC 6.1 05/29/2022   HGB 12.1 (L) 05/29/2022   HCT 36.3 (L) 05/29/2022   MCV 82.3 05/29/2022   PLT 214 05/29/2022     Lab Results  Component Value Date   ALT 16 05/29/2022   AST 22 05/29/2022   ALKPHOS 75 05/29/2022   BILITOT 0.4 05/29/2022     Imaging Studies   No results found.  Assessment   GERD/Esophageal Dysphagia/Hiatal hernia: presents with poorly controlled reflux uses otc antacids. His dysphagia could be secondary to  esophageal web/ring/stricture, esophagitis, or his hiatal hernia. Would recommend EGD with possible esophageal dilation in the near future.   He did not take his BP medications this morning. We had him take while in the office. BP came down a bit while present.   The patient was found to have elevated blood pressure when vital signs were checked in the office. The blood pressure was rechecked by the nursing staff and it was found be persistently elevated >140/90 mmHg. I personally advised to the patient to follow up closely with his PCP for hypertension control.   PLAN   Start pantoprazole 40mg daily before breakfast.  EGD/ED with Dr. Rourk. ASA 3.  I have discussed the risks, alternatives, benefits with regards to but not limited to the risk of reaction to medication, bleeding, infection, perforation and the patient is agreeable to proceed. Written consent to be obtained.    Mortimer Bair S. Myya Meenach, MHS, PA-C [facility]  

## 2022-09-23 ENCOUNTER — Encounter: Payer: Self-pay | Admitting: *Deleted

## 2022-09-23 DIAGNOSIS — Z992 Dependence on renal dialysis: Secondary | ICD-10-CM | POA: Diagnosis not present

## 2022-09-23 DIAGNOSIS — N186 End stage renal disease: Secondary | ICD-10-CM | POA: Diagnosis not present

## 2022-09-25 DIAGNOSIS — Z992 Dependence on renal dialysis: Secondary | ICD-10-CM | POA: Diagnosis not present

## 2022-09-25 DIAGNOSIS — N186 End stage renal disease: Secondary | ICD-10-CM | POA: Diagnosis not present

## 2022-09-27 DIAGNOSIS — N186 End stage renal disease: Secondary | ICD-10-CM | POA: Diagnosis not present

## 2022-09-27 DIAGNOSIS — Z992 Dependence on renal dialysis: Secondary | ICD-10-CM | POA: Diagnosis not present

## 2022-09-29 ENCOUNTER — Emergency Department (HOSPITAL_COMMUNITY)
Admission: EM | Admit: 2022-09-29 | Discharge: 2022-09-29 | Disposition: A | Discharge status: Home / Self Care | Payer: Medicare Other | Attending: Emergency Medicine | Admitting: Emergency Medicine

## 2022-09-29 ENCOUNTER — Encounter (HOSPITAL_COMMUNITY): Payer: Self-pay

## 2022-09-29 ENCOUNTER — Emergency Department (HOSPITAL_COMMUNITY): Payer: Medicare Other

## 2022-09-29 ENCOUNTER — Other Ambulatory Visit: Payer: Self-pay

## 2022-09-29 DIAGNOSIS — I120 Hypertensive chronic kidney disease with stage 5 chronic kidney disease or end stage renal disease: Secondary | ICD-10-CM | POA: Insufficient documentation

## 2022-09-29 DIAGNOSIS — M25512 Pain in left shoulder: Secondary | ICD-10-CM

## 2022-09-29 DIAGNOSIS — N186 End stage renal disease: Secondary | ICD-10-CM | POA: Insufficient documentation

## 2022-09-29 DIAGNOSIS — W01198A Fall on same level from slipping, tripping and stumbling with subsequent striking against other object, initial encounter: Secondary | ICD-10-CM | POA: Insufficient documentation

## 2022-09-29 DIAGNOSIS — E1122 Type 2 diabetes mellitus with diabetic chronic kidney disease: Secondary | ICD-10-CM | POA: Insufficient documentation

## 2022-09-29 DIAGNOSIS — Z7982 Long term (current) use of aspirin: Secondary | ICD-10-CM | POA: Diagnosis not present

## 2022-09-29 DIAGNOSIS — Z992 Dependence on renal dialysis: Secondary | ICD-10-CM | POA: Diagnosis not present

## 2022-09-29 DIAGNOSIS — Z79899 Other long term (current) drug therapy: Secondary | ICD-10-CM | POA: Insufficient documentation

## 2022-09-29 DIAGNOSIS — S4992XA Unspecified injury of left shoulder and upper arm, initial encounter: Secondary | ICD-10-CM | POA: Insufficient documentation

## 2022-09-29 DIAGNOSIS — M79622 Pain in left upper arm: Secondary | ICD-10-CM | POA: Diagnosis not present

## 2022-09-29 NOTE — ED Triage Notes (Signed)
Patient states he had a mechanical fall into a door frame over a week ago and hit his left upper arm and shoulder. Patient states he thought his arm was getting better but now has limited movement. Patient denies pain.

## 2022-09-29 NOTE — ED Notes (Signed)
Patient states "has sling at home" and does not want to wear the shoulder sling that was offered. Patient is taking sling with discharge.

## 2022-09-29 NOTE — Discharge Instructions (Addendum)
X-rays today were negative for any acute fracture or dislocation.  As discussed, symptoms likely secondary to rotator cuff pathology with possible frozen shoulder.  Wear shoulder sling as needed for pain relief.  Recommend continue doing range of motion exercises of your left shoulder as we talked about to avoid further freezing of your left shoulder.  Please call the orthopedics at your earliest convenience to set up an appointment; see number attached.  You can ice affected area as well as take Tylenol for pain.  Please not hesitate to return to emergency department at the worrisome signs and symptoms we discussed become apparent.

## 2022-09-29 NOTE — ED Notes (Signed)
Patient transported to X-ray 

## 2022-09-29 NOTE — ED Provider Notes (Signed)
[facility] EMERGENCY DEPARTMENT Provider Note   CSN: [id number] Arrival date & time: 09/29/22  1013     History  Chief Complaint  Patient presents with   Shoulder Injury    Lawrnce [name] is a 86 y.o. male.   Shoulder Injury   86 year old male presents emergency department after mechanical fall 8 days ago when his shoes caught the floor causing him to fall hitting his left shoulder on the door frame.  Denies trauma to head, loss of consciousness, blood thinner use.  Patient states that he has been protecting affected arm at side as well as taking Tylenol for pain which which has helped.  He notes worsening of symptoms and feelings of "stiffness of shoulder" over the past few days.  Denies any weakness/sensory deficits in affected arm.  Patient hemodialysis patient up which has received full treatment this past Saturday.  Denies fever, chills, night sweats, chest pain, shortness of breath.  Past medical history significant for ESRD on dialysis, hyperlipidemia, diabetes mellitus type 2, hypertension  Home Medications Prior to Admission medications   Medication Sig Start Date End Date Taking? Authorizing Provider  Accu-Chek Softclix Lancets lancets  10/27/19   [provider]  aspirin 81 MG tablet Take 81 mg by mouth daily.    [provider]  gabapentin (NEURONTIN) 300 MG capsule Take 300 mg by mouth in the morning. 01/19/15   [provider]  glucose blood (ACCU-CHEK AVIVA PLUS) test strip  11/27/17   [provider]  hydrALAZINE (APRESOLINE) 25 MG tablet Take 25 mg by mouth 3 (three) times daily.    [provider]  Lancets Misc. (ACCU-CHEK SOFTCLIX LANCET DEV) KIT  10/28/19   [provider]  linagliptin (TRADJENTA) 5 MG TABS tablet Take 5 mg by mouth daily.    [provider]  metoprolol tartrate (LOPRESSOR) 25 MG tablet Take 1 tablet (25 mg total) by mouth 2 (two) times daily. 04/18/20   Barton Dubois, MD  Multiple  Vitamin (MULTIVITAMIN WITH MINERALS) TABS tablet Take 1 tablet by mouth daily.    [provider]  NIFEdipine (PROCARDIA XL/ADALAT-CC) 90 MG 24 hr tablet TAKE (1) TABLET BY MOUTH ONCE DAILY. Patient taking differently: Take 90 mg by mouth daily before breakfast. 06/17/18   Herminio Commons, MD  pantoprazole (PROTONIX) 40 MG tablet Take 1 tablet (40 mg total) by mouth daily before breakfast. 09/22/22   Mahala Menghini, PA-C  tamsulosin (FLOMAX) 0.4 MG CAPS capsule Take by mouth daily. 09/02/22   [provider]  zolpidem (AMBIEN) 5 MG tablet Take 5 mg by mouth at bedtime as needed for sleep. 07/11/19   [provider]      Allergies    Patient has no known allergies.    Review of Systems   Review of Systems  All other systems reviewed and are negative.   Physical Exam Updated Vital Signs BP (!) 141/50   Pulse (!) 55   Temp 98 F (36.7 C) (Oral)   Resp 20   Ht _0  (1.651 m)   Wt 79 kg   SpO2 97%   BMI 28.98 kg/m  Physical Exam Vitals and nursing note reviewed.  Constitutional:      General: He is not in acute distress.    Appearance: He is well-developed.  HENT:     Head: Normocephalic and atraumatic.  Eyes:     Conjunctiva/sclera: Conjunctivae normal.  Cardiovascular:     Rate and Rhythm: Normal rate and regular rhythm.  Heart sounds: No murmur heard. Pulmonary:     Effort: Pulmonary effort is normal. No respiratory distress.     Breath sounds: Normal breath sounds.  Abdominal:     Palpations: Abdomen is soft.     Tenderness: There is no abdominal tenderness.  Musculoskeletal:        General: No swelling.     Cervical back: Neck supple.     Comments: Patient with no midline tenderness to cervical, thoracic, lumbar spine with no obvious step-off or deformity.  No rib tenderness palpated on left side.  No left clavicular or left scapular tenderness.  Mild tenderness to proximal humerus but otherwise, no tenderness to left upper extremity.   Radial pulses full intact bilaterally.  Patient complains of no sensory deficits along major nerve distributions upper extremities..  Patient with symmetric strength in grip, elbow flexion and extension on the left side.  Patient with limited range of motion in shoulder on the left side secondary to pain.  Patient guarding left shoulder.  Rotator cuff tests difficult to assess secondary to patient's pain and decreased range of motion.  Skin:    General: Skin is warm and dry.     Capillary Refill: Capillary refill takes less than 2 seconds.  Neurological:     Mental Status: He is alert.  Psychiatric:        Mood and Affect: Mood normal.     ED Results / Procedures / Treatments   Labs (all labs ordered are listed, but only abnormal results are displayed) Labs Reviewed - No data to display  EKG None  Radiology DG Humerus Left  Result Date: 09/29/2022 CLINICAL DATA:  [place] 1 week ago with pain EXAM: LEFT HUMERUS - 2+ VIEW COMPARISON:  None Available. FINDINGS: No evidence of fracture or dislocation. Some narrowing of the humeral acromial distance could correlate with rotator cuff disease. IMPRESSION: No fracture or dislocation. Narrowing of the humeral acromial distance which could correlate with rotator cuff disease. Electronically Signed   By: Nelson Chimes M.D.   On: 09/29/2022 11:23    Procedures Procedures    Medications Ordered in ED Medications - No data to display  ED Course/ Medical Decision Making/ A&P                           Medical Decision Making Amount and/or Complexity of Data Reviewed Radiology: ordered.   This patient presents to the ED for concern of left shoulder pain, this involves an extensive number of treatment options, and is a complaint that carries with it a high risk of complications and morbidity.  The differential diagnosis includes fracture, strain sprain, dislocation, ligamentous/tendinous injury, neurovascular compromise, arthritis, septic  arthritis, frozen shoulder   Co morbidities that complicate the patient evaluation  See HPI   Additional history obtained:  Additional history obtained from EMR External records from outside source obtained and reviewed including hospital records   Lab Tests:  N/a   Imaging Studies ordered:  I ordered imaging studies including left humeral x-ray I independently visualized and interpreted imaging which showed no fracture or dislocation.  Narrowing of humeral acromial distance. I agree with the radiologist interpretation  Cardiac Monitoring: / EKG:  The patient was maintained on a cardiac monitor.  I personally viewed and interpreted the cardiac monitored which showed an underlying rhythm of: Sinus rhythm   Consultations Obtained:  N/a   Problem List / ED Course / Critical interventions / Medication management  Left shoulder pain Patient declined medications at this time  Reevaluation of the patient showed that the patient stayed the same I have reviewed the patients home medicines and have made adjustments as needed   Social Determinants of Health:  Former cigarette use.  Denies illicit drug use.   Test / Admission - Considered:  Left shoulder pain Vitals signs significant for hypertension with blood pressure of 141/50.  Recommend follow-up with primary care regarding ablation blood pressure.. Otherwise within normal range and stable throughout visit. Imaging studies significant for: See above No evidence of acute fracture/dislocation.  Given history of present illness, concern for initial rotator cuff injury with subsequent freezing of the shoulder secondary to guarding.  Patient recommended symptomatic therapy at home with rest, ice, range of motion exercises as well as Tylenol for pain.  Patient advised to stay away from NSAIDs given history of CKD on hemodialysis.  Recommend follow-up with orthopedics outpatient for reevaluation of patient's symptoms.   Treatment plan discussed at length with patient and he acknowledged understanding was agreeable to said plan. Worrisome signs and symptoms were discussed with the patient, and the patient acknowledged understanding to return to the ED if noticed. Patient was stable upon discharge.          Final Clinical Impression(s) / ED Diagnoses Final diagnoses:  Acute pain of left shoulder    Rx / DC Orders ED Discharge Orders     None         Wilnette Kales, [place] 09/29/22 [place], [place], DO 10/03/22 [phone]

## 2022-09-30 DIAGNOSIS — N186 End stage renal disease: Secondary | ICD-10-CM | POA: Diagnosis not present

## 2022-09-30 DIAGNOSIS — Z992 Dependence on renal dialysis: Secondary | ICD-10-CM | POA: Diagnosis not present

## 2022-10-02 DIAGNOSIS — Z992 Dependence on renal dialysis: Secondary | ICD-10-CM | POA: Diagnosis not present

## 2022-10-02 DIAGNOSIS — N186 End stage renal disease: Secondary | ICD-10-CM | POA: Diagnosis not present

## 2022-10-04 DIAGNOSIS — Z992 Dependence on renal dialysis: Secondary | ICD-10-CM | POA: Diagnosis not present

## 2022-10-04 DIAGNOSIS — N186 End stage renal disease: Secondary | ICD-10-CM | POA: Diagnosis not present

## 2022-10-07 ENCOUNTER — Telehealth: Payer: Self-pay

## 2022-10-07 DIAGNOSIS — Z992 Dependence on renal dialysis: Secondary | ICD-10-CM | POA: Diagnosis not present

## 2022-10-07 DIAGNOSIS — N186 End stage renal disease: Secondary | ICD-10-CM | POA: Diagnosis not present

## 2022-10-07 NOTE — Telephone Encounter (Signed)
        Patient  visited [place] on 12/18     Telephone encounter attempt :  1st  A HIPAA compliant voice message was left requesting a return call.  Instructed patient to call back     [place], [place] [facility]  [phone] [address], [place], [place] [address] Phone: [phone] Email: [email or web address]

## 2022-10-08 ENCOUNTER — Telehealth: Payer: Self-pay

## 2022-10-08 NOTE — Telephone Encounter (Signed)
        Patient  visited [place] on 12/18  Telephone encounter attempt :  2nd  A HIPAA compliant voice message was left requesting a return call.  Instructed patient to call back.    [place], [facility]  [phone] [address], [place], [place] [address] Phone: [phone] Email: [email or web address]

## 2022-10-09 DIAGNOSIS — Z992 Dependence on renal dialysis: Secondary | ICD-10-CM | POA: Diagnosis not present

## 2022-10-09 DIAGNOSIS — N186 End stage renal disease: Secondary | ICD-10-CM | POA: Diagnosis not present

## 2022-10-11 DIAGNOSIS — Z992 Dependence on renal dialysis: Secondary | ICD-10-CM | POA: Diagnosis not present

## 2022-10-11 DIAGNOSIS — N186 End stage renal disease: Secondary | ICD-10-CM | POA: Diagnosis not present

## 2022-10-12 DIAGNOSIS — Z992 Dependence on renal dialysis: Secondary | ICD-10-CM | POA: Diagnosis not present

## 2022-10-12 DIAGNOSIS — N186 End stage renal disease: Secondary | ICD-10-CM | POA: Diagnosis not present

## 2022-10-14 ENCOUNTER — Telehealth: Payer: Self-pay | Admitting: *Deleted

## 2022-10-14 DIAGNOSIS — Z992 Dependence on renal dialysis: Secondary | ICD-10-CM | POA: Diagnosis not present

## 2022-10-14 DIAGNOSIS — N186 End stage renal disease: Secondary | ICD-10-CM | POA: Diagnosis not present

## 2022-10-14 NOTE — Telephone Encounter (Signed)
PA approved via [facility]. Auth# [id number], DOS: Oct 22, 2022 - Jan 20, 2023

## 2022-10-16 DIAGNOSIS — Z992 Dependence on renal dialysis: Secondary | ICD-10-CM | POA: Diagnosis not present

## 2022-10-16 DIAGNOSIS — N186 End stage renal disease: Secondary | ICD-10-CM | POA: Diagnosis not present

## 2022-10-17 ENCOUNTER — Encounter (HOSPITAL_COMMUNITY)
Admission: RE | Admit: 2022-10-17 | Discharge: 2022-10-17 | Disposition: A | Discharge status: Home / Self Care | Payer: Medicare Other | Source: Ambulatory Visit | Attending: Internal Medicine | Admitting: Internal Medicine

## 2022-10-17 VITALS — Ht 65.0 in | Wt 170.0 lb

## 2022-10-17 DIAGNOSIS — N184 Chronic kidney disease, stage 4 (severe): Secondary | ICD-10-CM

## 2022-10-18 DIAGNOSIS — Z992 Dependence on renal dialysis: Secondary | ICD-10-CM | POA: Diagnosis not present

## 2022-10-18 DIAGNOSIS — N186 End stage renal disease: Secondary | ICD-10-CM | POA: Diagnosis not present

## 2022-10-21 DIAGNOSIS — N186 End stage renal disease: Secondary | ICD-10-CM | POA: Diagnosis not present

## 2022-10-21 DIAGNOSIS — Z992 Dependence on renal dialysis: Secondary | ICD-10-CM | POA: Diagnosis not present

## 2022-10-22 ENCOUNTER — Ambulatory Visit (HOSPITAL_COMMUNITY)
Admission: RE | Admit: 2022-10-22 | Discharge: 2022-10-22 | Disposition: A | Discharge status: Home / Self Care | Payer: Medicare Other | Attending: Internal Medicine | Admitting: Internal Medicine

## 2022-10-22 ENCOUNTER — Ambulatory Visit (HOSPITAL_COMMUNITY): Payer: Medicare Other | Admitting: Anesthesiology

## 2022-10-22 ENCOUNTER — Ambulatory Visit (HOSPITAL_BASED_OUTPATIENT_CLINIC_OR_DEPARTMENT_OTHER): Payer: Medicare Other | Admitting: Anesthesiology

## 2022-10-22 ENCOUNTER — Other Ambulatory Visit: Payer: Self-pay

## 2022-10-22 ENCOUNTER — Encounter (HOSPITAL_COMMUNITY)
Admission: RE | Disposition: A | Discharge status: Home / Self Care | Payer: Self-pay | Source: Home / Self Care | Attending: Internal Medicine

## 2022-10-22 ENCOUNTER — Encounter (HOSPITAL_COMMUNITY): Payer: Self-pay | Admitting: Internal Medicine

## 2022-10-22 DIAGNOSIS — R1310 Dysphagia, unspecified: Secondary | ICD-10-CM | POA: Insufficient documentation

## 2022-10-22 DIAGNOSIS — K31A11 Gastric intestinal metaplasia without dysplasia, involving the antrum: Secondary | ICD-10-CM | POA: Diagnosis not present

## 2022-10-22 DIAGNOSIS — K31A19 Gastric intestinal metaplasia without dysplasia, unspecified site: Secondary | ICD-10-CM | POA: Diagnosis not present

## 2022-10-22 DIAGNOSIS — E1122 Type 2 diabetes mellitus with diabetic chronic kidney disease: Secondary | ICD-10-CM | POA: Insufficient documentation

## 2022-10-22 DIAGNOSIS — K219 Gastro-esophageal reflux disease without esophagitis: Secondary | ICD-10-CM | POA: Diagnosis not present

## 2022-10-22 DIAGNOSIS — D649 Anemia, unspecified: Secondary | ICD-10-CM | POA: Diagnosis not present

## 2022-10-22 DIAGNOSIS — N186 End stage renal disease: Secondary | ICD-10-CM | POA: Insufficient documentation

## 2022-10-22 DIAGNOSIS — D759 Disease of blood and blood-forming organs, unspecified: Secondary | ICD-10-CM | POA: Diagnosis not present

## 2022-10-22 DIAGNOSIS — Z87891 Personal history of nicotine dependence: Secondary | ICD-10-CM | POA: Diagnosis not present

## 2022-10-22 DIAGNOSIS — K259 Gastric ulcer, unspecified as acute or chronic, without hemorrhage or perforation: Secondary | ICD-10-CM | POA: Diagnosis not present

## 2022-10-22 DIAGNOSIS — Z79899 Other long term (current) drug therapy: Secondary | ICD-10-CM | POA: Diagnosis not present

## 2022-10-22 DIAGNOSIS — K449 Diaphragmatic hernia without obstruction or gangrene: Secondary | ICD-10-CM | POA: Diagnosis not present

## 2022-10-22 DIAGNOSIS — K269 Duodenal ulcer, unspecified as acute or chronic, without hemorrhage or perforation: Secondary | ICD-10-CM | POA: Diagnosis not present

## 2022-10-22 DIAGNOSIS — K319 Disease of stomach and duodenum, unspecified: Secondary | ICD-10-CM | POA: Diagnosis not present

## 2022-10-22 DIAGNOSIS — K222 Esophageal obstruction: Secondary | ICD-10-CM

## 2022-10-22 DIAGNOSIS — I1311 Hypertensive heart and chronic kidney disease without heart failure, with stage 5 chronic kidney disease, or end stage renal disease: Secondary | ICD-10-CM | POA: Diagnosis not present

## 2022-10-22 DIAGNOSIS — N184 Chronic kidney disease, stage 4 (severe): Secondary | ICD-10-CM

## 2022-10-22 HISTORY — PX: MALONEY DILATION: SHX5535

## 2022-10-22 HISTORY — PX: ESOPHAGOGASTRODUODENOSCOPY (EGD) WITH PROPOFOL: SHX5813

## 2022-10-22 HISTORY — PX: BIOPSY: SHX5522

## 2022-10-22 LAB — POCT I-STAT, CHEM 8
BUN: 32 mg/dL — ABNORMAL HIGH (ref 8–23)
Calcium, Ion: 0.73 mmol/L — CL (ref 1.15–1.40)
Chloride: 107 mmol/L (ref 98–111)
Creatinine, Ser: 5.9 mg/dL — ABNORMAL HIGH (ref 0.61–1.24)
Glucose, Bld: 115 mg/dL — ABNORMAL HIGH (ref 70–99)
HCT: 39 % (ref 39.0–52.0)
Hemoglobin: 13.3 g/dL (ref 13.0–17.0)
Potassium: 6.6 mmol/L (ref 3.5–5.1)
Sodium: 133 mmol/L — ABNORMAL LOW (ref 135–145)
TCO2: 27 mmol/L (ref 22–32)

## 2022-10-22 LAB — BASIC METABOLIC PANEL
Anion gap: 11 (ref 5–15)
BUN: 27 mg/dL — ABNORMAL HIGH (ref 8–23)
CO2: 27 mmol/L (ref 22–32)
Calcium: 9 mg/dL (ref 8.9–10.3)
Chloride: 99 mmol/L (ref 98–111)
Creatinine, Ser: 5.27 mg/dL — ABNORMAL HIGH (ref 0.61–1.24)
GFR, Estimated: 10 mL/min — ABNORMAL LOW (ref >60)
Glucose, Bld: 118 mg/dL — ABNORMAL HIGH (ref 70–99)
Potassium: 4.6 mmol/L (ref 3.5–5.1)
Sodium: 137 mmol/L (ref 135–145)

## 2022-10-22 SURGERY — ESOPHAGOGASTRODUODENOSCOPY (EGD) WITH PROPOFOL
Anesthesia: General

## 2022-10-22 MED ORDER — PROPOFOL 10 MG/ML IV BOLUS
INTRAVENOUS | Status: DC | PRN
  Administered 2022-10-22: 50 mg via INTRAVENOUS
  Administered 2022-10-22: 30 mg via INTRAVENOUS
  Administered 2022-10-22 (×3): 50 mg via INTRAVENOUS
  Administered 2022-10-22: 120 mg via INTRAVENOUS
  Administered 2022-10-22: 50 mg via INTRAVENOUS

## 2022-10-22 MED ORDER — LIDOCAINE HCL 1 % IJ SOLN
INTRAMUSCULAR | Status: DC | PRN
  Administered 2022-10-22: 50 mg via INTRADERMAL

## 2022-10-22 MED ORDER — LACTATED RINGERS IV SOLN: INTRAVENOUS | Status: DC

## 2022-10-22 MED ORDER — SODIUM CHLORIDE 0.9 % IV SOLN: INTRAVENOUS | Status: DC | PRN

## 2022-10-22 NOTE — Op Note (Signed)
[facility] Patient Name: Steve [name] Procedure Date: 10/22/2022 11:06 AM MRN: [id number] Date of Birth: 08-16-1936 Attending MD: Norvel Richards , MD, [id number] CSN: [id number] Age: 87 Admit Type: Outpatient Procedure:                Upper GI endoscopy Indications:              Dysphagia Providers:                Norvel Richards, MD, Rosina Lowenstein, RN, Raphael Gibney, Technician Referring MD:              Medicines:                Propofol per Anesthesia Complications:            No immediate complications. Estimated Blood Loss:     Estimated blood loss was minimal. Procedure:                Pre-Anesthesia Assessment:                           - Prior to the procedure, a History and Physical                            was performed, and patient medications and                            allergies were reviewed. The patient's tolerance of                            previous anesthesia was also reviewed. The risks                            and benefits of the procedure and the sedation                            options and risks were discussed with the patient.                            All questions were answered, and informed consent                            was obtained. Prior Anticoagulants: The patient has                            taken no anticoagulant or antiplatelet agents. ASA                            Grade Assessment: III - A patient with severe                            systemic disease. After reviewing the risks and  benefits, the patient was deemed in satisfactory                            condition to undergo the procedure.                           After obtaining informed consent, the endoscope was                            passed under direct vision. Throughout the                            procedure, the patient's blood pressure, pulse, and                            oxygen saturations  were monitored continuously. The                            [id number]-H190 ([id number]) scope was introduced through the                            mouth, and advanced to the second part of duodenum.                            The upper GI endoscopy was accomplished without                            difficulty. The patient tolerated the procedure                            well. Scope In: 11:36:44 AM Scope Out: 11:48:32 AM Total Procedure Duration: 0 hours 11 minutes 48 seconds  Findings:      A mild Schatzki ring was found at the gastroesophageal junction       esophageal mucosa otherwise appeared normal.. Gastric cavity empty. The       scope was withdrawn. Dilation was performed with a Maloney dilator with       mild resistance at 88 Fr. The scope was withdrawn. Dilation was       performed with a Maloney dilator with mild resistance at 56 Fr. The       dilation site was examined following endoscope reinsertion and showed       complete resolution of luminal narrowing. Estimated blood loss was       minimal.      A medium-sized hiatal hernia was present. Scattered antral erosions. No       ulcer or infiltrating process. Patent pylorus. Examination the bulb       second portion revealed numerous 2 to 4 mm erosions. No frank ulcer or       neoplasm seen.      Finally, biopsies of the antrum and gastric body were taken for       histologic study. Impression:               - Mild Schatzki ring. Dilated.                           -  Medium-sized hiatal hernia. Gastric in duodenal                            erosions of uncertain significance. Status post                            gastric biopsy Moderate Sedation:      Moderate (conscious) sedation was personally administered by an       anesthesia professional. The following parameters were monitored: oxygen       saturation, heart rate, blood pressure, respiratory rate, EKG, adequacy       of pulmonary ventilation, and response to  care. Recommendation:           - Patient has a contact number available for                            emergencies. The signs and symptoms of potential                            delayed complications were discussed with the                            patient. Return to normal activities tomorrow.                            Written discharge instructions were provided to the                            patient.                           - Advance diet as tolerated. Continue pantoprazole                            or Protonix 40 mg daily 30 minutes before                            breakfast. Further recommendations to follow                            pending review of pathology report                           Office visit with [place] in 4 to 6 weeks. Procedure Code(s):        --- Professional ---                           [phone], Esophagogastroduodenoscopy, flexible,                            transoral; diagnostic, including collection of                            specimen(s) by brushing or washing, when performed                            (  separate procedure)                           43450, Dilation of esophagus, by unguided sound or                            bougie, single or multiple passes Diagnosis Code(s):        --- Professional ---                           K22.2, Esophageal obstruction                           K44.9, Diaphragmatic hernia without obstruction or                            gangrene                           R13.10, Dysphagia, unspecified CPT copyright 2022 American Medical Association. All rights reserved. The codes documented in this report are preliminary and upon coder review may  be revised to meet current compliance requirements. Cristopher Estimable. Shaneka Efaw, MD Norvel Richards, MD 10/22/2022 11:54:07 AM This report has been signed electronically. Number of Addenda: 0

## 2022-10-22 NOTE — Discharge Instructions (Signed)
EGD Discharge instructions Please read the instructions outlined below and refer to this sheet in the next few weeks. These discharge instructions provide you with general information on caring for yourself after you leave the hospital. Your doctor may also give you specific instructions. While your treatment has been planned according to the most current medical practices available, unavoidable complications occasionally occur. If you have any problems or questions after discharge, please call your doctor. ACTIVITY You may resume your regular activity but move at a slower pace for the next 24 hours.  Take frequent rest periods for the next 24 hours.  Walking will help expel (get rid of) the air and reduce the bloated feeling in your abdomen.  No driving for 24 hours (because of the anesthesia (medicine) used during the test).  You may shower.  Do not sign any important legal documents or operate any machinery for 24 hours (because of the anesthesia used during the test).  NUTRITION Drink plenty of fluids.  You may resume your normal diet.  Begin with a light meal and progress to your normal diet.  Avoid alcoholic beverages for 24 hours or as instructed by your caregiver.  MEDICATIONS You may resume your normal medications unless your caregiver tells you otherwise.  WHAT YOU CAN EXPECT TODAY You may experience abdominal discomfort such as a feeling of fullness or "gas" pains.  FOLLOW-UP Your doctor will discuss the results of your test with you.  SEEK IMMEDIATE MEDICAL ATTENTION IF ANY OF THE FOLLOWING OCCUR: Excessive nausea (feeling sick to your stomach) and/or vomiting.  Severe abdominal pain and distention (swelling).  Trouble swallowing.  Temperature over 101 F (37.8 C).  Rectal bleeding or vomiting of blood.     You had a Schatzki's ring in your esophagus benign narrowing.  It was dilated.  Your stomach was inflamed.  Biopsies were taken.  Further recommendations to follow  pending review of pathology report  Continue pantoprazole or Protonix 40 mg 30 minutes before breakfast every day  Office visit with Neil Crouch in 6 to 8 weeks.  At patient request, called Kristeen Miss at [phone] findings and recommendations  At patient request,

## 2022-10-22 NOTE — Anesthesia Preprocedure Evaluation (Signed)
Anesthesia Evaluation  Patient identified by MRN, date of birth, ID band Patient awake    Reviewed: Allergy & Precautions, H&P , NPO status , Patient's Chart, lab work & pertinent test results, reviewed documented beta blocker date and time   Airway Mallampati: II  TM Distance: >3 FB Neck ROM: full    Dental no notable dental hx.    Pulmonary neg pulmonary ROS, former smoker   Pulmonary exam normal breath sounds clear to auscultation       Cardiovascular Exercise Tolerance: Good hypertension, negative cardio ROS  Rhythm:regular Rate:Normal     Neuro/Psych  Neuromuscular disease negative neurological ROS  negative psych ROS   GI/Hepatic negative GI ROS, Neg liver ROS, hiatal hernia,GERD  ,,  Endo/Other  negative endocrine ROSdiabetes    Renal/GU Renal diseasenegative Renal ROS  negative genitourinary   Musculoskeletal   Abdominal   Peds  Hematology negative hematology ROS (+) Blood dyscrasia, anemia   Anesthesia Other Findings   Reproductive/Obstetrics negative OB ROS                             Anesthesia Physical Anesthesia Plan  ASA: 2  Anesthesia Plan: General   Post-op Pain Management:    Induction:   PONV Risk Score and Plan: Propofol infusion  Airway Management Planned:   Additional Equipment:   Intra-op Plan:   Post-operative Plan:   Informed Consent: I have reviewed the patients History and Physical, chart, labs and discussed the procedure including the risks, benefits and alternatives for the proposed anesthesia with the patient or authorized representative who has indicated his/her understanding and acceptance.     Dental Advisory Given  Plan Discussed with: CRNA  Anesthesia Plan Comments:        Anesthesia Quick Evaluation

## 2022-10-22 NOTE — Anesthesia Postprocedure Evaluation (Signed)
Anesthesia Post Note  Patient: Steve [name]  Procedure(s) Performed: ESOPHAGOGASTRODUODENOSCOPY (EGD) WITH PROPOFOL [place]  Patient location during evaluation: Short Stay Anesthesia Type: General Level of consciousness: awake and alert Pain management: pain level controlled Vital Signs Assessment: post-procedure vital signs reviewed and stable Respiratory status: spontaneous breathing Cardiovascular status: blood pressure returned to baseline and stable Postop Assessment: no apparent nausea or vomiting Anesthetic complications: no   No notable events documented.   Last Vitals:  Vitals:   10/22/22 1001 10/22/22 1200  BP: (!) 169/70 (!) 109/54  Pulse: 60 66  Resp: 17 15  Temp:  (!) 36.4 C  SpO2: 100% 97%    Last Pain:  Vitals:   10/22/22 1200  TempSrc: Oral  PainSc: 0-No pain                 Richrd Kuzniar

## 2022-10-22 NOTE — Transfer of Care (Signed)
Immediate Anesthesia Transfer of Care Note  Patient: Steve [name]  Procedure(s) Performed: ESOPHAGOGASTRODUODENOSCOPY (EGD) WITH PROPOFOL MALONEY DILATION BIOPSY  Patient Location: Short Stay  Anesthesia Type:General  Level of Consciousness: awake  Airway & Oxygen Therapy: Patient Spontanous Breathing  Post-op Assessment: Report given to RN  Post vital signs: Reviewed and stable  Last Vitals:  Vitals Value Taken Time  BP 109/54 10/22/22 1200  Temp 36.4 C 10/22/22 1200  Pulse 66 10/22/22 1200  Resp 15 10/22/22 1200  SpO2 97 % 10/22/22 1200    Last Pain:  Vitals:   10/22/22 1200  TempSrc: Oral  PainSc: 0-No pain      Patients Stated Pain Goal: 8 (38/18/40 3754)  Complications: No notable events documented.

## 2022-10-22 NOTE — Interval H&P Note (Signed)
History and Physical Interval Note:  10/22/2022 11:07 AM  Steve [name]  has presented today for surgery, with the diagnosis of gerd,dysphagia,hiatal hernia.  The various methods of treatment have been discussed with the patient and family. After consideration of risks, benefits and other options for treatment, the patient has consented to  Procedure(s) with comments: ESOPHAGOGASTRODUODENOSCOPY (EGD) WITH PROPOFOL (N/A) - 12:30 pm, pt know to arrive at 9:15, LM to see if pt can come earlier [place] (N/A) as a surgical intervention.  The patient's history has been reviewed, patient examined, no change in status, stable for surgery.  I have reviewed the patient's chart and labs.  Questions were answered to the patient's satisfaction.     Steve [name]   no change.  GERD and chronic esophageal dysphagia.    Does not perceive much difference in pantoprazole 40 mg daily thus far.  I have offered the patient EGD with esophageal dilation as feasible/appropriate per plan.  The risks, benefits, limitations, alternatives and imponderables have been reviewed with the patient. Potential for esophageal dilation, biopsy, etc. have also been reviewed.  Questions have been answered. All parties agreeable.

## 2022-10-23 DIAGNOSIS — N186 End stage renal disease: Secondary | ICD-10-CM | POA: Diagnosis not present

## 2022-10-23 DIAGNOSIS — Z992 Dependence on renal dialysis: Secondary | ICD-10-CM | POA: Diagnosis not present

## 2022-10-23 LAB — SURGICAL PATHOLOGY

## 2022-10-25 DIAGNOSIS — Z992 Dependence on renal dialysis: Secondary | ICD-10-CM | POA: Diagnosis not present

## 2022-10-25 DIAGNOSIS — N186 End stage renal disease: Secondary | ICD-10-CM | POA: Diagnosis not present

## 2022-10-26 ENCOUNTER — Encounter: Payer: Self-pay | Admitting: Internal Medicine

## 2022-10-27 ENCOUNTER — Encounter (HOSPITAL_COMMUNITY): Payer: Self-pay | Admitting: Internal Medicine

## 2022-10-28 DIAGNOSIS — Z992 Dependence on renal dialysis: Secondary | ICD-10-CM | POA: Diagnosis not present

## 2022-10-28 DIAGNOSIS — N186 End stage renal disease: Secondary | ICD-10-CM | POA: Diagnosis not present

## 2022-10-29 ENCOUNTER — Encounter: Payer: Self-pay | Admitting: Orthopedic Surgery

## 2022-10-29 ENCOUNTER — Ambulatory Visit (INDEPENDENT_AMBULATORY_CARE_PROVIDER_SITE_OTHER): Payer: Medicare Other | Admitting: Orthopedic Surgery

## 2022-10-29 VITALS — Ht 65.0 in | Wt 170.0 lb

## 2022-10-29 DIAGNOSIS — M12812 Other specific arthropathies, not elsewhere classified, left shoulder: Secondary | ICD-10-CM

## 2022-10-30 ENCOUNTER — Ambulatory Visit: Payer: Medicare Other | Admitting: Orthopaedic Surgery

## 2022-10-30 ENCOUNTER — Encounter: Payer: Self-pay | Admitting: Orthopedic Surgery

## 2022-10-30 DIAGNOSIS — N186 End stage renal disease: Secondary | ICD-10-CM | POA: Diagnosis not present

## 2022-10-30 DIAGNOSIS — Z992 Dependence on renal dialysis: Secondary | ICD-10-CM | POA: Diagnosis not present

## 2022-10-30 NOTE — Progress Notes (Signed)
New Patient Visit  Assessment: Amdrew [name] is a 87 y.o. male with the following: 1. Rotator cuff arthropathy of left shoulder  Plan: Peggye Form has pain in his left shoulder.  He states he fell about a month ago.  He lost his balance and fell on a wall.  Since then, he has had severe pain and difficulty lifting his arm above the level of the shoulder.  Radiographs were obtained in the ED, and these demonstrate proximal humeral migration, consistent with rotator cuff arthropathy.  He denies a history of an injury in his left shoulder.  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  It is possible that he has injured it, but was adamant that he had never sustained an injury.  Nonetheless, I discussed the nature of his injury.  His current complaint is pain, and I think an injection could help with this.  I made no guarantees in regards to his motion.  He states his understanding.  We could consider reverse shoulder arthroplasty, but he would be high risk surgical candidate as he is a diabetic, as well as on dialysis.  He will follow-up as needed.  Procedure note injection Left shoulder    Verbal consent was obtained to inject the left shoulder, subacromial space Timeout was completed to confirm the site of injection.  The skin was prepped with alcohol and ethyl chloride was sprayed at the injection site.  A 21-gauge needle was used to inject 40 mg of Depo-Medrol and 1% lidocaine (3 cc) into the subacromial space of the left shoulder using a posterolateral approach.  There were no complications. A sterile bandage was applied.   Follow-up: Return if symptoms worsen or fail to improve.  Subjective:  Chief Complaint  Patient presents with   Shoulder Pain    Lt shoulder pain after a fall 4 wks ago   ZOX:[id number] JXB:[id number] Exp: 03/12/24     History of Present Illness: Steve [name] is a 87 y.o. male who presents for evaluation  of left shoulder pain.  He states he had no issues with his left shoulder, until approximately 1 month ago, when he stumbled and fell into the wall.  He had immediate pain.  Since then, he has continued to have pain, as well as difficulty with range of motion.  He cannot get his arm above the level of the shoulder.  He drove trucks for years, but notes that he had no significant injuries.  He has never had an issue with his left shoulder.  He has not had an injection.  No therapy.   Review of Systems: No fevers or chills No numbness or tingling No chest pain No shortness of breath No bowel or bladder dysfunction No GI distress No headaches   Medical History:  Past Medical History:  Diagnosis Date   Anemia    Arthritis    ESRD (end stage renal disease) on dialysis ([place])    T/TH/Sat   Essential hypertension    GERD (gastroesophageal reflux disease)    Hyperlipidemia    Prostate cancer ([place])    took radiation   Type 2 diabetes mellitus ([place])     Past Surgical History:  Procedure Laterality Date   ANTERIOR CERVICAL DECOMP/DISCECTOMY FUSION N/A 08/23/2020   Procedure: Cervical 3-4 Anterior cervical decompression/discectomy/fusion;  Surgeon: Erline Levine, MD;  Location: [place];  Service: Neurosurgery;  Laterality: N/A;  3C/RM 21 to follow   AV FISTULA PLACEMENT Left  02/21/2021   Procedure: INSERTION OF ARTERIOVENOUS GORE-TEX GRAFT LEFT ARM;  Surgeon: Rosetta Posner, MD;  [facility];  Service: Vascular;  Laterality: Left;   BIOPSY  10/22/2022   Procedure: BIOPSY;  Surgeon: Daneil Dolin, MD;  [facility];  Service: Endoscopy;;   CATARACT EXTRACTION W/PHACO Right 04/28/2016   Procedure: CATARACT EXTRACTION PHACO AND INTRAOCULAR LENS PLACEMENT ([place]);  Surgeon: Williams Che, MD;  [facility];  Service: Ophthalmology;  Laterality: Right;  CDE: 4.28   COLONOSCOPY     about 2 years in [place] around 2016   CYSTECTOMY     pt. denies   ESOPHAGOGASTRODUODENOSCOPY  (EGD) WITH PROPOFOL N/A 10/22/2022   Procedure: ESOPHAGOGASTRODUODENOSCOPY (EGD) WITH PROPOFOL;  Surgeon: Daneil Dolin, MD;  [facility];  Service: Endoscopy;  Laterality: N/A;  12:30 pm, pt know to arrive at 9:15, LM to see if pt can come earlier   IR FLUORO GUIDE CV LINE LEFT  12/20/2020   IR US GUIDE VASC ACCESS LEFT  12/20/2020   MALONEY DILATION N/A 10/22/2022   Procedure: Venia Minks DILATION;  Surgeon: Daneil Dolin, MD;  [facility];  Service: Endoscopy;  Laterality: N/A;    Family History  Problem Relation Age of Onset   Heart disease Mother        cause of death   Diabetes Sister    Colon cancer Neg Hx    Social History   Tobacco Use   Smoking status: Former    Packs/day: 2.00    Years: 25.00    Total pack years: 50.00    Types: Pipe, Cigars, Cigarettes    Start date: 11/08/1954    Quit date: 10/14/1987    Years since quitting: 35.0   Smokeless tobacco: Never   Tobacco comments:    About 2 pipes a day  Vaping Use   Vaping Use: Never used  Substance Use Topics   Alcohol use: No    Alcohol/week: 0.0 standard drinks of alcohol   Drug use: No    No Known Allergies  No outpatient medications have been marked as taking for the 10/29/22 encounter (Office Visit) with Steve Rasmussen, MD.    Objective: Ht '5\' 5"'$  (1.651 m)   Wt 170 lb (77.1 kg)   BMI 28.29 kg/m   Physical Exam:  General: Elderly male., Alert and oriented., and No acute distress. Gait: Ambulates with the assistance of a cane.  Evaluation of left shoulder demonstrates no atrophy.  No deformity.  Pseudoparalysis, forward flexion limited to 90 degrees.  Passively I can get him beyond 90 degrees.  Positive drop arm test.  Fingers are warm and well-perfused.  Healthy appearing shunt for dialysis.      IMAGING: I personally reviewed images previously obtained from the ED  Wrist were previously obtained.  These demonstrated proximal humeral migration, with almost complete loss of  the space between the humerus and the acromion.   New Medications:  No orders of the defined types were placed in this encounter.     Steve Rasmussen, MD  10/30/2022 10:31 AM

## 2022-11-01 DIAGNOSIS — Z992 Dependence on renal dialysis: Secondary | ICD-10-CM | POA: Diagnosis not present

## 2022-11-01 DIAGNOSIS — N186 End stage renal disease: Secondary | ICD-10-CM | POA: Diagnosis not present

## 2022-11-04 DIAGNOSIS — N186 End stage renal disease: Secondary | ICD-10-CM | POA: Diagnosis not present

## 2022-11-04 DIAGNOSIS — Z992 Dependence on renal dialysis: Secondary | ICD-10-CM | POA: Diagnosis not present

## 2022-11-06 DIAGNOSIS — N186 End stage renal disease: Secondary | ICD-10-CM | POA: Diagnosis not present

## 2022-11-06 DIAGNOSIS — Z992 Dependence on renal dialysis: Secondary | ICD-10-CM | POA: Diagnosis not present

## 2022-11-08 DIAGNOSIS — Z992 Dependence on renal dialysis: Secondary | ICD-10-CM | POA: Diagnosis not present

## 2022-11-08 DIAGNOSIS — N186 End stage renal disease: Secondary | ICD-10-CM | POA: Diagnosis not present

## 2022-11-11 DIAGNOSIS — N186 End stage renal disease: Secondary | ICD-10-CM | POA: Diagnosis not present

## 2022-11-11 DIAGNOSIS — Z992 Dependence on renal dialysis: Secondary | ICD-10-CM | POA: Diagnosis not present

## 2022-11-12 DIAGNOSIS — Z992 Dependence on renal dialysis: Secondary | ICD-10-CM | POA: Diagnosis not present

## 2022-11-12 DIAGNOSIS — N186 End stage renal disease: Secondary | ICD-10-CM | POA: Diagnosis not present

## 2022-11-13 DIAGNOSIS — Z992 Dependence on renal dialysis: Secondary | ICD-10-CM | POA: Diagnosis not present

## 2022-11-13 DIAGNOSIS — N186 End stage renal disease: Secondary | ICD-10-CM | POA: Diagnosis not present

## 2022-11-15 DIAGNOSIS — Z992 Dependence on renal dialysis: Secondary | ICD-10-CM | POA: Diagnosis not present

## 2022-11-15 DIAGNOSIS — N186 End stage renal disease: Secondary | ICD-10-CM | POA: Diagnosis not present

## 2022-11-18 DIAGNOSIS — Z992 Dependence on renal dialysis: Secondary | ICD-10-CM | POA: Diagnosis not present

## 2022-11-18 DIAGNOSIS — N186 End stage renal disease: Secondary | ICD-10-CM | POA: Diagnosis not present

## 2022-11-20 DIAGNOSIS — N186 End stage renal disease: Secondary | ICD-10-CM | POA: Diagnosis not present

## 2022-11-20 DIAGNOSIS — Z992 Dependence on renal dialysis: Secondary | ICD-10-CM | POA: Diagnosis not present

## 2022-11-22 DIAGNOSIS — Z992 Dependence on renal dialysis: Secondary | ICD-10-CM | POA: Diagnosis not present

## 2022-11-22 DIAGNOSIS — N186 End stage renal disease: Secondary | ICD-10-CM | POA: Diagnosis not present

## 2022-11-25 DIAGNOSIS — Z992 Dependence on renal dialysis: Secondary | ICD-10-CM | POA: Diagnosis not present

## 2022-11-25 DIAGNOSIS — N186 End stage renal disease: Secondary | ICD-10-CM | POA: Diagnosis not present

## 2022-11-27 DIAGNOSIS — N186 End stage renal disease: Secondary | ICD-10-CM | POA: Diagnosis not present

## 2022-11-27 DIAGNOSIS — Z992 Dependence on renal dialysis: Secondary | ICD-10-CM | POA: Diagnosis not present

## 2022-11-29 DIAGNOSIS — Z992 Dependence on renal dialysis: Secondary | ICD-10-CM | POA: Diagnosis not present

## 2022-11-29 DIAGNOSIS — N186 End stage renal disease: Secondary | ICD-10-CM | POA: Diagnosis not present

## 2022-11-30 NOTE — Progress Notes (Unsigned)
GI Office Note    Referring Provider: Carrolyn Meiers* Primary Care Physician:  Carrolyn Meiers, MD  Primary Gastroenterologist: Garfield Cornea, MD   Chief Complaint   No chief complaint on file.   History of Present Illness   Steve [name] is a 87 y.o. male presenting today for follow up. Last seen in office in 09/2022 for dysphagia.     EGD 10/2022: -mild schatzki ring, s/p dilation -medium sized hh    Medications   Current Outpatient Medications  Medication Sig Dispense Refill   Accu-Chek Softclix Lancets lancets      aspirin 81 MG tablet Take 81 mg by mouth daily.     gabapentin (NEURONTIN) 100 MG capsule Take 100 mg by mouth daily.     glucose blood (ACCU-CHEK AVIVA PLUS) test strip      hydrALAZINE (APRESOLINE) 25 MG tablet Take 25 mg by mouth 3 (three) times daily.     Lancets Misc. (ACCU-CHEK SOFTCLIX LANCET DEV) KIT      lidocaine-prilocaine (EMLA) cream SMARTSIG:1 Topical     linagliptin (TRADJENTA) 5 MG TABS tablet Take 5 mg by mouth daily.     loratadine (CLARITIN) 10 MG tablet Take 10 mg by mouth daily.     metoprolol tartrate (LOPRESSOR) 25 MG tablet Take 1 tablet (25 mg total) by mouth 2 (two) times daily.     Multiple Vitamin (MULTIVITAMIN WITH MINERALS) TABS tablet Take 1 tablet by mouth daily.     NIFEdipine (PROCARDIA XL/ADALAT-CC) 90 MG 24 hr tablet TAKE (1) TABLET BY MOUTH ONCE DAILY. (Patient taking differently: Take 90 mg by mouth daily before breakfast.) 30 tablet 6   pantoprazole (PROTONIX) 40 MG tablet Take 1 tablet (40 mg total) by mouth daily before breakfast. 90 tablet 1   tamsulosin (FLOMAX) 0.4 MG CAPS capsule Take by mouth daily.     zolpidem (AMBIEN) 5 MG tablet Take 5 mg by mouth at bedtime as needed for sleep.     No current facility-administered medications for this visit.    Allergies   Allergies as of 12/01/2022   (No Known Allergies)     Past Medical History   Past Medical History:  Diagnosis Date    Anemia    Arthritis    ESRD (end stage renal disease) on dialysis [facility])    T/TH/Sat   Essential hypertension    GERD (gastroesophageal reflux disease)    Hyperlipidemia    Prostate cancer ([place])    took radiation   Type 2 diabetes mellitus ([place])     Past Surgical History   Past Surgical History:  Procedure Laterality Date   ANTERIOR CERVICAL DECOMP/DISCECTOMY FUSION N/A 08/23/2020   Procedure: Cervical 3-4 Anterior cervical decompression/discectomy/fusion;  Surgeon: Erline Levine, MD;  Location: [place];  Service: Neurosurgery;  Laterality: N/A;  3C/RM 21 to follow   AV FISTULA PLACEMENT Left 02/21/2021   Procedure: INSERTION OF ARTERIOVENOUS GORE-TEX GRAFT LEFT ARM;  Surgeon: Rosetta Posner, MD;  [facility];  Service: Vascular;  Laterality: Left;   BIOPSY  10/22/2022   Procedure: BIOPSY;  Surgeon: Daneil Dolin, MD;  [facility];  Service: Endoscopy;;   CATARACT EXTRACTION W/PHACO Right 04/28/2016   Procedure: CATARACT EXTRACTION PHACO AND INTRAOCULAR LENS PLACEMENT ([place]);  Surgeon: Williams Che, MD;  [facility];  Service: Ophthalmology;  Laterality: Right;  CDE: 4.28   COLONOSCOPY     about 2 years in [place] around 2016   CYSTECTOMY  pt. denies   ESOPHAGOGASTRODUODENOSCOPY (EGD) WITH PROPOFOL N/A 10/22/2022   Procedure: ESOPHAGOGASTRODUODENOSCOPY (EGD) WITH PROPOFOL;  Surgeon: Daneil Dolin, MD;  [facility];  Service: Endoscopy;  Laterality: N/A;  12:30 pm, pt know to arrive at 9:15, LM to see if pt can come earlier   IR FLUORO GUIDE CV LINE LEFT  12/20/2020   IR US GUIDE VASC ACCESS LEFT  12/20/2020   MALONEY DILATION N/A 10/22/2022   Procedure: Venia Minks DILATION;  Surgeon: Daneil Dolin, MD;  [facility];  Service: Endoscopy;  Laterality: N/A;    Past Family History   Family History  Problem Relation Age of Onset   Heart disease Mother        cause of death   Diabetes Sister    Colon cancer Neg Hx     Past Social  History   Social History   Socioeconomic History   Marital status: Divorced    Spouse name: Not on file   Number of children: Not on file   Years of education: Not on file   Highest education level: Not on file  Occupational History   Occupation: Retired  Tobacco Use   Smoking status: Former    Packs/day: 2.00    Years: 25.00    Total pack years: 50.00    Types: Pipe, Cigars, Cigarettes    Start date: 11/08/1954    Quit date: 10/14/1987    Years since quitting: 35.1   Smokeless tobacco: Never   Tobacco comments:    About 2 pipes a day  Vaping Use   Vaping Use: Never used  Substance and Sexual Activity   Alcohol use: No    Alcohol/week: 0.0 standard drinks of alcohol   Drug use: No   Sexual activity: Not Currently  Other Topics Concern   Not on file  Social History Narrative   Not on file   Social Determinants of Health   Financial Resource Strain: Not on file  Food Insecurity: Not on file  Transportation Needs: Not on file  Physical Activity: Not on file  Stress: Not on file  Social Connections: Not on file  Intimate Partner Violence: Not on file    Review of Systems   General: Negative for anorexia, weight loss, fever, chills, fatigue, weakness. ENT: Negative for hoarseness, difficulty swallowing , nasal congestion. CV: Negative for chest pain, angina, palpitations, dyspnea on exertion, peripheral edema.  Respiratory: Negative for dyspnea at rest, dyspnea on exertion, cough, sputum, wheezing.  GI: See history of present illness. GU:  Negative for dysuria, hematuria, urinary incontinence, urinary frequency, nocturnal urination.  Endo: Negative for unusual weight change.     Physical Exam   There were no vitals taken for this visit.   General: Well-nourished, well-developed in no acute distress.  Eyes: No icterus. Mouth: Oropharyngeal mucosa moist and pink , no lesions erythema or exudate. Lungs: Clear to auscultation bilaterally.  Heart: Regular rate and  rhythm, no murmurs rubs or gallops.  Abdomen: Bowel sounds are normal, nontender, nondistended, no hepatosplenomegaly or masses,  no abdominal bruits or hernia , no rebound or guarding.  Rectal: ***  Extremities: No lower extremity edema. No clubbing or deformities. Neuro: Alert and oriented x 4   Skin: Warm and dry, no jaundice.   Psych: Alert and cooperative, normal mood and affect.  Labs   *** Imaging Studies   No results found.  Assessment       PLAN   ***   Laureen Ochs.  Bobby Rumpf, [place], [facility]

## 2022-12-01 ENCOUNTER — Encounter: Payer: Self-pay | Admitting: Gastroenterology

## 2022-12-01 ENCOUNTER — Telehealth: Payer: Self-pay | Admitting: *Deleted

## 2022-12-01 ENCOUNTER — Ambulatory Visit (INDEPENDENT_AMBULATORY_CARE_PROVIDER_SITE_OTHER): Payer: Medicare Other | Admitting: Gastroenterology

## 2022-12-01 VITALS — BP 158/79 | HR 59 | Temp 98.0°F | Ht 65.0 in | Wt 172.4 lb

## 2022-12-01 DIAGNOSIS — K219 Gastro-esophageal reflux disease without esophagitis: Secondary | ICD-10-CM

## 2022-12-01 DIAGNOSIS — R1310 Dysphagia, unspecified: Secondary | ICD-10-CM | POA: Diagnosis not present

## 2022-12-01 NOTE — Telephone Encounter (Signed)
LMOVM to return call  BPE scheduled for 12/05/22, Friday, arrive at 8:45 am, nothing to eat or drink 3 hours prior.

## 2022-12-01 NOTE — Patient Instructions (Signed)
Xray of your esophagus to be scheduled. We will be in touch with results as available. Continue pantoprazole 50m daily before breakfast.  Follow up with PCP regarding persistent elevation of blood pressure.

## 2022-12-02 DIAGNOSIS — Z992 Dependence on renal dialysis: Secondary | ICD-10-CM | POA: Diagnosis not present

## 2022-12-02 DIAGNOSIS — N186 End stage renal disease: Secondary | ICD-10-CM | POA: Diagnosis not present

## 2022-12-04 DIAGNOSIS — Z992 Dependence on renal dialysis: Secondary | ICD-10-CM | POA: Diagnosis not present

## 2022-12-04 DIAGNOSIS — N186 End stage renal disease: Secondary | ICD-10-CM | POA: Diagnosis not present

## 2022-12-05 ENCOUNTER — Other Ambulatory Visit (HOSPITAL_COMMUNITY): Payer: Medicare Other

## 2022-12-06 DIAGNOSIS — N186 End stage renal disease: Secondary | ICD-10-CM | POA: Diagnosis not present

## 2022-12-06 DIAGNOSIS — Z992 Dependence on renal dialysis: Secondary | ICD-10-CM | POA: Diagnosis not present

## 2022-12-09 DIAGNOSIS — Z992 Dependence on renal dialysis: Secondary | ICD-10-CM | POA: Diagnosis not present

## 2022-12-09 DIAGNOSIS — N186 End stage renal disease: Secondary | ICD-10-CM | POA: Diagnosis not present

## 2022-12-11 DIAGNOSIS — Z992 Dependence on renal dialysis: Secondary | ICD-10-CM | POA: Diagnosis not present

## 2022-12-11 DIAGNOSIS — N186 End stage renal disease: Secondary | ICD-10-CM | POA: Diagnosis not present

## 2022-12-13 DIAGNOSIS — N186 End stage renal disease: Secondary | ICD-10-CM | POA: Diagnosis not present

## 2022-12-13 DIAGNOSIS — Z992 Dependence on renal dialysis: Secondary | ICD-10-CM | POA: Diagnosis not present

## 2022-12-15 DIAGNOSIS — Z0001 Encounter for general adult medical examination with abnormal findings: Secondary | ICD-10-CM | POA: Diagnosis not present

## 2022-12-15 DIAGNOSIS — E1142 Type 2 diabetes mellitus with diabetic polyneuropathy: Secondary | ICD-10-CM | POA: Diagnosis not present

## 2022-12-15 DIAGNOSIS — E785 Hyperlipidemia, unspecified: Secondary | ICD-10-CM | POA: Diagnosis not present

## 2022-12-15 DIAGNOSIS — K219 Gastro-esophageal reflux disease without esophagitis: Secondary | ICD-10-CM | POA: Diagnosis not present

## 2022-12-15 DIAGNOSIS — Z992 Dependence on renal dialysis: Secondary | ICD-10-CM | POA: Diagnosis not present

## 2022-12-15 DIAGNOSIS — I10 Essential (primary) hypertension: Secondary | ICD-10-CM | POA: Diagnosis not present

## 2022-12-15 DIAGNOSIS — Z1389 Encounter for screening for other disorder: Secondary | ICD-10-CM | POA: Diagnosis not present

## 2022-12-15 DIAGNOSIS — M1990 Unspecified osteoarthritis, unspecified site: Secondary | ICD-10-CM | POA: Diagnosis not present

## 2022-12-16 DIAGNOSIS — N186 End stage renal disease: Secondary | ICD-10-CM | POA: Diagnosis not present

## 2022-12-16 DIAGNOSIS — Z992 Dependence on renal dialysis: Secondary | ICD-10-CM | POA: Diagnosis not present

## 2022-12-18 DIAGNOSIS — N186 End stage renal disease: Secondary | ICD-10-CM | POA: Diagnosis not present

## 2022-12-18 DIAGNOSIS — Z992 Dependence on renal dialysis: Secondary | ICD-10-CM | POA: Diagnosis not present

## 2022-12-19 NOTE — Telephone Encounter (Signed)
Had VM from pt about xray for throat. Reports they did not get the appt.

## 2022-12-19 NOTE — Telephone Encounter (Signed)
Pt informed that BPE has been rescheduled for 12/22/22, arrive at 2:45 pm at [facility] to check in, NPO after 12 pm. Verbalized understanding.

## 2022-12-20 DIAGNOSIS — Z992 Dependence on renal dialysis: Secondary | ICD-10-CM | POA: Diagnosis not present

## 2022-12-20 DIAGNOSIS — N186 End stage renal disease: Secondary | ICD-10-CM | POA: Diagnosis not present

## 2022-12-22 ENCOUNTER — Ambulatory Visit (HOSPITAL_COMMUNITY)
Admission: RE | Admit: 2022-12-22 | Discharge: 2022-12-22 | Disposition: A | Discharge status: Home / Self Care | Payer: Medicare Other | Source: Ambulatory Visit | Attending: Gastroenterology | Admitting: Gastroenterology

## 2022-12-22 DIAGNOSIS — K219 Gastro-esophageal reflux disease without esophagitis: Secondary | ICD-10-CM | POA: Insufficient documentation

## 2022-12-22 DIAGNOSIS — R1310 Dysphagia, unspecified: Secondary | ICD-10-CM | POA: Insufficient documentation

## 2022-12-23 DIAGNOSIS — N186 End stage renal disease: Secondary | ICD-10-CM | POA: Diagnosis not present

## 2022-12-23 DIAGNOSIS — Z992 Dependence on renal dialysis: Secondary | ICD-10-CM | POA: Diagnosis not present

## 2022-12-25 DIAGNOSIS — Z992 Dependence on renal dialysis: Secondary | ICD-10-CM | POA: Diagnosis not present

## 2022-12-25 DIAGNOSIS — N186 End stage renal disease: Secondary | ICD-10-CM | POA: Diagnosis not present

## 2022-12-27 DIAGNOSIS — N186 End stage renal disease: Secondary | ICD-10-CM | POA: Diagnosis not present

## 2022-12-27 DIAGNOSIS — Z992 Dependence on renal dialysis: Secondary | ICD-10-CM | POA: Diagnosis not present

## 2022-12-30 DIAGNOSIS — Z992 Dependence on renal dialysis: Secondary | ICD-10-CM | POA: Diagnosis not present

## 2022-12-30 DIAGNOSIS — N186 End stage renal disease: Secondary | ICD-10-CM | POA: Diagnosis not present

## 2022-12-31 ENCOUNTER — Telehealth: Payer: Self-pay

## 2022-12-31 MED ORDER — PANTOPRAZOLE SODIUM 40 MG PO TBEC: 40.0000 mg | DELAYED_RELEASE_TABLET | Freq: Every day | ORAL | 11 refills | Status: AC

## 2022-12-31 NOTE — Telephone Encounter (Signed)
Faxed was received from [facility] for Pantoprazole Sod DR  40 mg tab QTY: 30. Pt was last seen on 12/01/2022.

## 2022-12-31 NOTE — Addendum Note (Signed)
Addended by: Mahala Menghini on: 12/31/2022 09:42 AM   Modules accepted: Orders

## 2023-01-01 DIAGNOSIS — N186 End stage renal disease: Secondary | ICD-10-CM | POA: Diagnosis not present

## 2023-01-01 DIAGNOSIS — Z992 Dependence on renal dialysis: Secondary | ICD-10-CM | POA: Diagnosis not present

## 2023-01-03 DIAGNOSIS — N186 End stage renal disease: Secondary | ICD-10-CM | POA: Diagnosis not present

## 2023-01-03 DIAGNOSIS — Z992 Dependence on renal dialysis: Secondary | ICD-10-CM | POA: Diagnosis not present

## 2023-01-06 DIAGNOSIS — Z992 Dependence on renal dialysis: Secondary | ICD-10-CM | POA: Diagnosis not present

## 2023-01-06 DIAGNOSIS — N186 End stage renal disease: Secondary | ICD-10-CM | POA: Diagnosis not present

## 2023-01-08 DIAGNOSIS — Z992 Dependence on renal dialysis: Secondary | ICD-10-CM | POA: Diagnosis not present

## 2023-01-08 DIAGNOSIS — N186 End stage renal disease: Secondary | ICD-10-CM | POA: Diagnosis not present

## 2023-01-10 DIAGNOSIS — N186 End stage renal disease: Secondary | ICD-10-CM | POA: Diagnosis not present

## 2023-01-10 DIAGNOSIS — Z992 Dependence on renal dialysis: Secondary | ICD-10-CM | POA: Diagnosis not present

## 2023-01-11 DIAGNOSIS — N186 End stage renal disease: Secondary | ICD-10-CM | POA: Diagnosis not present

## 2023-01-11 DIAGNOSIS — Z992 Dependence on renal dialysis: Secondary | ICD-10-CM | POA: Diagnosis not present

## 2023-01-13 DIAGNOSIS — Z992 Dependence on renal dialysis: Secondary | ICD-10-CM | POA: Diagnosis not present

## 2023-01-13 DIAGNOSIS — N186 End stage renal disease: Secondary | ICD-10-CM | POA: Diagnosis not present

## 2023-01-15 DIAGNOSIS — N186 End stage renal disease: Secondary | ICD-10-CM | POA: Diagnosis not present

## 2023-01-15 DIAGNOSIS — Z992 Dependence on renal dialysis: Secondary | ICD-10-CM | POA: Diagnosis not present

## 2023-01-17 DIAGNOSIS — Z992 Dependence on renal dialysis: Secondary | ICD-10-CM | POA: Diagnosis not present

## 2023-01-17 DIAGNOSIS — N186 End stage renal disease: Secondary | ICD-10-CM | POA: Diagnosis not present

## 2023-01-20 DIAGNOSIS — Z992 Dependence on renal dialysis: Secondary | ICD-10-CM | POA: Diagnosis not present

## 2023-01-20 DIAGNOSIS — N186 End stage renal disease: Secondary | ICD-10-CM | POA: Diagnosis not present

## 2023-01-22 DIAGNOSIS — Z992 Dependence on renal dialysis: Secondary | ICD-10-CM | POA: Diagnosis not present

## 2023-01-22 DIAGNOSIS — N186 End stage renal disease: Secondary | ICD-10-CM | POA: Diagnosis not present

## 2023-01-24 DIAGNOSIS — N186 End stage renal disease: Secondary | ICD-10-CM | POA: Diagnosis not present

## 2023-01-24 DIAGNOSIS — Z992 Dependence on renal dialysis: Secondary | ICD-10-CM | POA: Diagnosis not present

## 2023-01-27 DIAGNOSIS — Z992 Dependence on renal dialysis: Secondary | ICD-10-CM | POA: Diagnosis not present

## 2023-01-27 DIAGNOSIS — N186 End stage renal disease: Secondary | ICD-10-CM | POA: Diagnosis not present

## 2023-01-29 DIAGNOSIS — Z992 Dependence on renal dialysis: Secondary | ICD-10-CM | POA: Diagnosis not present

## 2023-01-29 DIAGNOSIS — N186 End stage renal disease: Secondary | ICD-10-CM | POA: Diagnosis not present

## 2023-01-30 DIAGNOSIS — N186 End stage renal disease: Secondary | ICD-10-CM | POA: Diagnosis not present

## 2023-01-30 DIAGNOSIS — I871 Compression of vein: Secondary | ICD-10-CM | POA: Diagnosis not present

## 2023-01-30 DIAGNOSIS — Z992 Dependence on renal dialysis: Secondary | ICD-10-CM | POA: Diagnosis not present

## 2023-01-30 DIAGNOSIS — T82858A Stenosis of vascular prosthetic devices, implants and grafts, initial encounter: Secondary | ICD-10-CM | POA: Diagnosis not present

## 2023-01-31 DIAGNOSIS — N186 End stage renal disease: Secondary | ICD-10-CM | POA: Diagnosis not present

## 2023-01-31 DIAGNOSIS — Z992 Dependence on renal dialysis: Secondary | ICD-10-CM | POA: Diagnosis not present

## 2023-02-03 DIAGNOSIS — N186 End stage renal disease: Secondary | ICD-10-CM | POA: Diagnosis not present

## 2023-02-03 DIAGNOSIS — Z992 Dependence on renal dialysis: Secondary | ICD-10-CM | POA: Diagnosis not present

## 2023-02-05 DIAGNOSIS — N186 End stage renal disease: Secondary | ICD-10-CM | POA: Diagnosis not present

## 2023-02-05 DIAGNOSIS — Z992 Dependence on renal dialysis: Secondary | ICD-10-CM | POA: Diagnosis not present

## 2023-02-07 DIAGNOSIS — N186 End stage renal disease: Secondary | ICD-10-CM | POA: Diagnosis not present

## 2023-02-07 DIAGNOSIS — Z992 Dependence on renal dialysis: Secondary | ICD-10-CM | POA: Diagnosis not present

## 2023-02-10 DIAGNOSIS — Z992 Dependence on renal dialysis: Secondary | ICD-10-CM | POA: Diagnosis not present

## 2023-02-10 DIAGNOSIS — N186 End stage renal disease: Secondary | ICD-10-CM | POA: Diagnosis not present

## 2023-02-11 ENCOUNTER — Ambulatory Visit: Payer: Medicare Other | Admitting: Podiatry

## 2023-02-11 DIAGNOSIS — E119 Type 2 diabetes mellitus without complications: Secondary | ICD-10-CM

## 2023-02-11 DIAGNOSIS — M2142 Flat foot [pes planus] (acquired), left foot: Secondary | ICD-10-CM

## 2023-02-11 DIAGNOSIS — E0840 Diabetes mellitus due to underlying condition with diabetic neuropathy, unspecified: Secondary | ICD-10-CM | POA: Diagnosis not present

## 2023-02-11 DIAGNOSIS — M2041 Other hammer toe(s) (acquired), right foot: Secondary | ICD-10-CM

## 2023-02-11 DIAGNOSIS — M2042 Other hammer toe(s) (acquired), left foot: Secondary | ICD-10-CM

## 2023-02-11 DIAGNOSIS — M2141 Flat foot [pes planus] (acquired), right foot: Secondary | ICD-10-CM

## 2023-02-11 NOTE — Progress Notes (Signed)
Chief Complaint  Patient presents with   Diabetes    Patient came in today for diabetic foot care, nail trim, A1c-7.0 BG- not taking  patient is having some swelling in the right ankle, started month ago, patient denies any pain     HPI: 87 y.o. male presenting today as a new patient with his nephew for diabetic foot exam.  Patient states that about 3 years ago he began to experience swelling and edema to the bilateral feet right greater than the left.  Idiopathic onset.  He does not have any pain associated to the feet and ankles but he is concerned for the swelling.  Presenting for further treatment and evaluation  Past Medical History:  Diagnosis Date   Anemia    Arthritis    ESRD (end stage renal disease) on dialysis [facility])    T/TH/Sat   Essential hypertension    GERD (gastroesophageal reflux disease)    Hyperlipidemia    Prostate cancer ([facility])    took radiation   Type 2 diabetes mellitus ([facility])     Past Surgical History:  Procedure Laterality Date   ANTERIOR CERVICAL DECOMP/DISCECTOMY FUSION N/A 08/23/2020   Procedure: Cervical 3-4 Anterior cervical decompression/discectomy/fusion;  Surgeon: Maeola Harman, MD;  Location: [facility];  Service: Neurosurgery;  Laterality: N/A;  3C/RM 21 to follow   AV FISTULA PLACEMENT Left 02/21/2021   Procedure: INSERTION OF ARTERIOVENOUS GORE-TEX GRAFT LEFT ARM;  Surgeon: Larina Earthly, MD;  [facility];  Service: Vascular;  Laterality: Left;   BIOPSY  10/22/2022   Procedure: BIOPSY;  Surgeon: Corbin Ade, MD;  [facility];  Service: Endoscopy;;   CATARACT EXTRACTION W/PHACO Right 04/28/2016   Procedure: CATARACT EXTRACTION PHACO AND INTRAOCULAR LENS PLACEMENT ([facility]);  Surgeon: Susa Simmonds, MD;  [facility];  Service: Ophthalmology;  Laterality: Right;  CDE: 4.28   COLONOSCOPY     about 2 years in [place] around 2016   CYSTECTOMY     pt. denies   ESOPHAGOGASTRODUODENOSCOPY (EGD) WITH PROPOFOL N/A 10/22/2022   Procedure:  ESOPHAGOGASTRODUODENOSCOPY (EGD) WITH PROPOFOL;  Surgeon: Corbin Ade, MD;  [facility];  Service: Endoscopy;  Laterality: N/A;  12:30 pm, pt know to arrive at 9:15, LM to see if pt can come earlier   IR FLUORO GUIDE CV LINE LEFT  12/20/2020   IR US GUIDE VASC ACCESS LEFT  12/20/2020   MALONEY DILATION N/A 10/22/2022   Procedure: Elease Hashimoto DILATION;  Surgeon: Corbin Ade, MD;  [facility];  Service: Endoscopy;  Laterality: N/A;    No Known Allergies   Physical Exam: General: The patient is alert and oriented x3 in no acute distress.  Dermatology: Skin is warm, dry and supple bilateral lower extremities.   Vascular: Capillary refill WNL.  Skin is warm to touch.  Chronic moderate edema noted bilateral lower extremities  Neurological: Grossly intact via light touch  Musculoskeletal Exam: Mild to moderate hammertoe contracture noted with collapse of the medial longitudinal arch during weightbearing consistent with a pes planus valgus deformity.  No prior amputations   Assessment/Plan of Care: 1.  T2DM; uncomplicated 2.  Bilateral lower extremity edema 3.  Pes planus with hammertoes bilateral  -Patient evaluated.  Comprehensive diabetic lower extremity exam performed today -Patient has no pain or tenderness associated to the feet or ankles.  Compression ankle sleeve dispensed.  Wear daily -Appointment with diabetic shoe department here in our office for custom molded diabetic insoles and shoes -  Return to clinic annually      Felecia Shelling, DPM [facility]  Dr. Felecia Shelling, DPM    [address] [place], [place] [address]                Office [phone]  Fax [phone]

## 2023-02-12 DIAGNOSIS — Z23 Encounter for immunization: Secondary | ICD-10-CM | POA: Diagnosis not present

## 2023-02-12 DIAGNOSIS — N186 End stage renal disease: Secondary | ICD-10-CM | POA: Diagnosis not present

## 2023-02-12 DIAGNOSIS — Z992 Dependence on renal dialysis: Secondary | ICD-10-CM | POA: Diagnosis not present

## 2023-02-14 DIAGNOSIS — Z992 Dependence on renal dialysis: Secondary | ICD-10-CM | POA: Diagnosis not present

## 2023-02-14 DIAGNOSIS — N186 End stage renal disease: Secondary | ICD-10-CM | POA: Diagnosis not present

## 2023-02-14 DIAGNOSIS — Z23 Encounter for immunization: Secondary | ICD-10-CM | POA: Diagnosis not present

## 2023-02-17 DIAGNOSIS — Z23 Encounter for immunization: Secondary | ICD-10-CM | POA: Diagnosis not present

## 2023-02-17 DIAGNOSIS — N186 End stage renal disease: Secondary | ICD-10-CM | POA: Diagnosis not present

## 2023-02-17 DIAGNOSIS — Z992 Dependence on renal dialysis: Secondary | ICD-10-CM | POA: Diagnosis not present

## 2023-02-19 DIAGNOSIS — N186 End stage renal disease: Secondary | ICD-10-CM | POA: Diagnosis not present

## 2023-02-19 DIAGNOSIS — Z23 Encounter for immunization: Secondary | ICD-10-CM | POA: Diagnosis not present

## 2023-02-19 DIAGNOSIS — Z992 Dependence on renal dialysis: Secondary | ICD-10-CM | POA: Diagnosis not present

## 2023-02-21 DIAGNOSIS — Z992 Dependence on renal dialysis: Secondary | ICD-10-CM | POA: Diagnosis not present

## 2023-02-21 DIAGNOSIS — Z23 Encounter for immunization: Secondary | ICD-10-CM | POA: Diagnosis not present

## 2023-02-21 DIAGNOSIS — N186 End stage renal disease: Secondary | ICD-10-CM | POA: Diagnosis not present

## 2023-02-24 DIAGNOSIS — N186 End stage renal disease: Secondary | ICD-10-CM | POA: Diagnosis not present

## 2023-02-24 DIAGNOSIS — Z992 Dependence on renal dialysis: Secondary | ICD-10-CM | POA: Diagnosis not present

## 2023-02-24 DIAGNOSIS — Z23 Encounter for immunization: Secondary | ICD-10-CM | POA: Diagnosis not present

## 2023-02-26 DIAGNOSIS — N186 End stage renal disease: Secondary | ICD-10-CM | POA: Diagnosis not present

## 2023-02-26 DIAGNOSIS — Z992 Dependence on renal dialysis: Secondary | ICD-10-CM | POA: Diagnosis not present

## 2023-02-26 DIAGNOSIS — Z23 Encounter for immunization: Secondary | ICD-10-CM | POA: Diagnosis not present

## 2023-02-28 DIAGNOSIS — N186 End stage renal disease: Secondary | ICD-10-CM | POA: Diagnosis not present

## 2023-02-28 DIAGNOSIS — Z23 Encounter for immunization: Secondary | ICD-10-CM | POA: Diagnosis not present

## 2023-02-28 DIAGNOSIS — Z992 Dependence on renal dialysis: Secondary | ICD-10-CM | POA: Diagnosis not present

## 2023-03-03 DIAGNOSIS — Z23 Encounter for immunization: Secondary | ICD-10-CM | POA: Diagnosis not present

## 2023-03-03 DIAGNOSIS — N186 End stage renal disease: Secondary | ICD-10-CM | POA: Diagnosis not present

## 2023-03-03 DIAGNOSIS — Z992 Dependence on renal dialysis: Secondary | ICD-10-CM | POA: Diagnosis not present

## 2023-03-05 DIAGNOSIS — Z23 Encounter for immunization: Secondary | ICD-10-CM | POA: Diagnosis not present

## 2023-03-05 DIAGNOSIS — Z992 Dependence on renal dialysis: Secondary | ICD-10-CM | POA: Diagnosis not present

## 2023-03-05 DIAGNOSIS — N186 End stage renal disease: Secondary | ICD-10-CM | POA: Diagnosis not present

## 2023-03-07 DIAGNOSIS — Z992 Dependence on renal dialysis: Secondary | ICD-10-CM | POA: Diagnosis not present

## 2023-03-07 DIAGNOSIS — Z23 Encounter for immunization: Secondary | ICD-10-CM | POA: Diagnosis not present

## 2023-03-07 DIAGNOSIS — N186 End stage renal disease: Secondary | ICD-10-CM | POA: Diagnosis not present

## 2023-03-10 DIAGNOSIS — Z23 Encounter for immunization: Secondary | ICD-10-CM | POA: Diagnosis not present

## 2023-03-10 DIAGNOSIS — Z992 Dependence on renal dialysis: Secondary | ICD-10-CM | POA: Diagnosis not present

## 2023-03-10 DIAGNOSIS — N186 End stage renal disease: Secondary | ICD-10-CM | POA: Diagnosis not present

## 2023-03-11 ENCOUNTER — Ambulatory Visit (INDEPENDENT_AMBULATORY_CARE_PROVIDER_SITE_OTHER): Payer: Medicare Other

## 2023-03-11 DIAGNOSIS — M2041 Other hammer toe(s) (acquired), right foot: Secondary | ICD-10-CM

## 2023-03-11 DIAGNOSIS — M2042 Other hammer toe(s) (acquired), left foot: Secondary | ICD-10-CM

## 2023-03-11 DIAGNOSIS — M2142 Flat foot [pes planus] (acquired), left foot: Secondary | ICD-10-CM

## 2023-03-11 DIAGNOSIS — E0840 Diabetes mellitus due to underlying condition with diabetic neuropathy, unspecified: Secondary | ICD-10-CM

## 2023-03-11 DIAGNOSIS — M2141 Flat foot [pes planus] (acquired), right foot: Secondary | ICD-10-CM

## 2023-03-11 NOTE — Progress Notes (Signed)
Patient presents to the office today for diabetic shoe and insole measuring.  Patient was measured with brannock device to determine size and width for 1 pair of extra depth shoes and foam casted for 3 pair of insoles.   ABN signed.   Documentation of medical necessity will be sent to patient's treating diabetic doctor to verify and sign.   Patient's diabetic provider: Benetta Spar, MD   Shoes and insoles will be ordered at that time and patient will be notified for an appointment for fitting when they arrive.   Brannock measurement: 9  Patient shoe selection-   1st   Shoe choice:   [id number]  2nd  Shoe choice:   [id number]  Shoe size ordered: 9

## 2023-03-12 DIAGNOSIS — N186 End stage renal disease: Secondary | ICD-10-CM | POA: Diagnosis not present

## 2023-03-12 DIAGNOSIS — Z992 Dependence on renal dialysis: Secondary | ICD-10-CM | POA: Diagnosis not present

## 2023-03-12 DIAGNOSIS — Z23 Encounter for immunization: Secondary | ICD-10-CM | POA: Diagnosis not present

## 2023-03-13 DIAGNOSIS — Z992 Dependence on renal dialysis: Secondary | ICD-10-CM | POA: Diagnosis not present

## 2023-03-13 DIAGNOSIS — N186 End stage renal disease: Secondary | ICD-10-CM | POA: Diagnosis not present

## 2023-03-14 DIAGNOSIS — Z992 Dependence on renal dialysis: Secondary | ICD-10-CM | POA: Diagnosis not present

## 2023-03-14 DIAGNOSIS — N186 End stage renal disease: Secondary | ICD-10-CM | POA: Diagnosis not present

## 2023-03-17 DIAGNOSIS — N186 End stage renal disease: Secondary | ICD-10-CM | POA: Diagnosis not present

## 2023-03-17 DIAGNOSIS — Z992 Dependence on renal dialysis: Secondary | ICD-10-CM | POA: Diagnosis not present

## 2023-03-19 DIAGNOSIS — Z992 Dependence on renal dialysis: Secondary | ICD-10-CM | POA: Diagnosis not present

## 2023-03-19 DIAGNOSIS — N186 End stage renal disease: Secondary | ICD-10-CM | POA: Diagnosis not present

## 2023-03-21 DIAGNOSIS — Z992 Dependence on renal dialysis: Secondary | ICD-10-CM | POA: Diagnosis not present

## 2023-03-21 DIAGNOSIS — N186 End stage renal disease: Secondary | ICD-10-CM | POA: Diagnosis not present

## 2023-03-23 DIAGNOSIS — Z992 Dependence on renal dialysis: Secondary | ICD-10-CM | POA: Diagnosis not present

## 2023-03-23 DIAGNOSIS — I10 Essential (primary) hypertension: Secondary | ICD-10-CM | POA: Diagnosis not present

## 2023-03-23 DIAGNOSIS — E1142 Type 2 diabetes mellitus with diabetic polyneuropathy: Secondary | ICD-10-CM | POA: Diagnosis not present

## 2023-03-23 DIAGNOSIS — E785 Hyperlipidemia, unspecified: Secondary | ICD-10-CM | POA: Diagnosis not present

## 2023-03-23 DIAGNOSIS — K219 Gastro-esophageal reflux disease without esophagitis: Secondary | ICD-10-CM | POA: Diagnosis not present

## 2023-03-24 ENCOUNTER — Ambulatory Visit: Payer: Medicare Other | Admitting: Internal Medicine

## 2023-03-24 DIAGNOSIS — Z992 Dependence on renal dialysis: Secondary | ICD-10-CM | POA: Diagnosis not present

## 2023-03-24 DIAGNOSIS — N186 End stage renal disease: Secondary | ICD-10-CM | POA: Diagnosis not present

## 2023-03-26 DIAGNOSIS — N186 End stage renal disease: Secondary | ICD-10-CM | POA: Diagnosis not present

## 2023-03-26 DIAGNOSIS — Z992 Dependence on renal dialysis: Secondary | ICD-10-CM | POA: Diagnosis not present

## 2023-03-27 ENCOUNTER — Ambulatory Visit: Payer: Medicare Other | Admitting: Gastroenterology

## 2023-03-27 ENCOUNTER — Ambulatory Visit: Payer: Medicare Other | Admitting: Internal Medicine

## 2023-03-28 DIAGNOSIS — N186 End stage renal disease: Secondary | ICD-10-CM | POA: Diagnosis not present

## 2023-03-28 DIAGNOSIS — Z992 Dependence on renal dialysis: Secondary | ICD-10-CM | POA: Diagnosis not present

## 2023-03-29 NOTE — Progress Notes (Unsigned)
Referring Provider: Benetta Spar* Primary Care Physician:  Benetta Spar, MD Primary GI Physician: Dr. Jena Gauss  No chief complaint on file.   HPI:   Steve [name] is a 87 y.o. male presenting today for follow-up.    Last seen in our office 12/01/2022 for dysphagia and GERD.  He reported esophageal dilation of Schatzki's ring in January 2024 helped a little, but did not have significant improvement.  Felt liquids and solids "ball up" in the upper throat/esophagus.  Denied heartburn.  Noted a little pain in the stomach at times improved by Pepto-Bismol.  No other significant symptoms.    He was scheduled for BPE which was completed in March and showed laryngeal penetration and aspiration with spontaneous cough reflex, slight irregularity at GE junction possibly at site of recently dilated stricture with questionably a tiny epiphrenic diverticulum, barium tablet passed without obstruction.  He was offered speech therapy evaluation with possible modified barium swallow study versus monitoring, and patient preferred to monitor. Recommended 3 month follow-up.   Today:      EGD 10/2022: -mild schatzki ring, s/p dilation -medium sized hh -Gastric and duodenal erosions s/p biopsy.  Pathology with nonspecific reactive gastropathy with erosion, intestinal metaplasia without dysplasia, negative for H. pylori.   CT angio chest August 2023: Mildly patulous esophagus in the setting of small hiatal hernia without change compared to 2019.  Negative for pulmonary embolus.  Moderate cardiomegaly.   CT abdomen pelvis August 2023: Mild to moderate hiatal hernia, pancolonic diverticulosis.  Signs of prior prostate radiation.  Past Medical History:  Diagnosis Date   Anemia    Arthritis    ESRD (end stage renal disease) on dialysis [facility])    T/TH/Sat   Essential hypertension    GERD (gastroesophageal reflux disease)    Hyperlipidemia    Prostate cancer (HCC)    took radiation    Type 2 diabetes mellitus (HCC)     Past Surgical History:  Procedure Laterality Date   ANTERIOR CERVICAL DECOMP/DISCECTOMY FUSION N/A 08/23/2020   Procedure: Cervical 3-4 Anterior cervical decompression/discectomy/fusion;  Surgeon: Maeola Harman, MD;  Location: [facility] OR;  Service: Neurosurgery;  Laterality: N/A;  3C/RM 21 to follow   AV FISTULA PLACEMENT Left 02/21/2021   Procedure: INSERTION OF ARTERIOVENOUS GORE-TEX GRAFT LEFT ARM;  Surgeon: Larina Earthly, MD;  Location: [facility] ORS;  Service: Vascular;  Laterality: Left;   BIOPSY  10/22/2022   Procedure: BIOPSY;  Surgeon: Corbin Ade, MD;  Location: [facility] ENDO SUITE;  Service: Endoscopy;;   CATARACT EXTRACTION W/PHACO Right 04/28/2016   Procedure: CATARACT EXTRACTION PHACO AND INTRAOCULAR LENS PLACEMENT (IOC);  Surgeon: Susa Simmonds, MD;  Location: [facility] ORS;  Service: Ophthalmology;  Laterality: Right;  CDE: 4.28   COLONOSCOPY     about 2 years in [place] around 2016   CYSTECTOMY     pt. denies   ESOPHAGOGASTRODUODENOSCOPY (EGD) WITH PROPOFOL N/A 10/22/2022   Procedure: ESOPHAGOGASTRODUODENOSCOPY (EGD) WITH PROPOFOL;  Surgeon: Corbin Ade, MD;  Location: [facility] ENDO SUITE;  Service: Endoscopy;  Laterality: N/A;  12:30 pm, pt know to arrive at 9:15, LM to see if pt can come earlier   IR FLUORO GUIDE CV LINE LEFT  12/20/2020   IR US GUIDE VASC ACCESS LEFT  12/20/2020   MALONEY DILATION N/A 10/22/2022   Procedure: Elease Hashimoto DILATION;  Surgeon: Corbin Ade, MD;  Location: [facility] ENDO SUITE;  Service: Endoscopy;  Laterality: N/A;    Current Outpatient Medications  Medication Sig Dispense  Refill   Accu-Chek Softclix Lancets lancets      aspirin 81 MG tablet Take 81 mg by mouth daily.     gabapentin (NEURONTIN) 100 MG capsule Take 100 mg by mouth daily.     glucose blood (ACCU-CHEK AVIVA PLUS) test strip      hydrALAZINE (APRESOLINE) 25 MG tablet Take 25 mg by mouth 3 (three) times daily.     Lancets Misc. (ACCU-CHEK SOFTCLIX LANCET DEV) KIT       lidocaine-prilocaine (EMLA) cream SMARTSIG:1 Topical     linagliptin (TRADJENTA) 5 MG TABS tablet Take 5 mg by mouth daily.     loratadine (CLARITIN) 10 MG tablet Take 10 mg by mouth daily.     metoprolol tartrate (LOPRESSOR) 25 MG tablet Take 1 tablet (25 mg total) by mouth 2 (two) times daily.     Multiple Vitamin (MULTIVITAMIN WITH MINERALS) TABS tablet Take 1 tablet by mouth daily.     NIFEdipine (PROCARDIA XL/ADALAT-CC) 90 MG 24 hr tablet TAKE (1) TABLET BY MOUTH ONCE DAILY. (Patient taking differently: Take 90 mg by mouth daily before breakfast.) 30 tablet 6   pantoprazole (PROTONIX) 40 MG tablet Take 1 tablet (40 mg total) by mouth daily before breakfast. 30 tablet 11   tamsulosin (FLOMAX) 0.4 MG CAPS capsule Take by mouth daily.     zolpidem (AMBIEN) 5 MG tablet Take 5 mg by mouth at bedtime as needed for sleep.     No current facility-administered medications for this visit.    Allergies as of 04/01/2023   (No Known Allergies)    Family History  Problem Relation Age of Onset   Heart disease Mother        cause of death   Diabetes Sister    Colon cancer Neg Hx     Social History   Socioeconomic History   Marital status: Divorced    Spouse name: Not on file   Number of children: Not on file   Years of education: Not on file   Highest education level: Not on file  Occupational History   Occupation: Retired  Tobacco Use   Smoking status: Former    Packs/day: 2.00    Years: 25.00    Additional pack years: 0.00    Total pack years: 50.00    Types: Pipe, Cigars, Cigarettes    Start date: 11/08/1954    Quit date: 10/14/1987    Years since quitting: 35.4   Smokeless tobacco: Never   Tobacco comments:    About 2 pipes a day  Vaping Use   Vaping Use: Never used  Substance and Sexual Activity   Alcohol use: No    Alcohol/week: 0.0 standard drinks of alcohol   Drug use: No   Sexual activity: Not Currently  Other Topics Concern   Not on file  Social History Narrative    Not on file   Social Determinants of Health   Financial Resource Strain: Not on file  Food Insecurity: Not on file  Transportation Needs: Not on file  Physical Activity: Not on file  Stress: Not on file  Social Connections: Not on file    Review of Systems: Gen: Denies fever, chills, cold or flulike symptoms, presyncope, syncope. CV: Denies chest pain, palpitations. Resp: Denies dyspnea at rest, cough. GI: See HPI Heme: See HPI  Physical Exam: There were no vitals taken for this visit. General:   Alert and oriented. No distress noted. Pleasant and cooperative.  Head:  Normocephalic and atraumatic. Eyes:  Conjuctiva  clear without scleral icterus. Heart:  S1, S2 present without murmurs appreciated. Lungs:  Clear to auscultation bilaterally. No wheezes, rales, or rhonchi. No distress.  Abdomen:  +BS, soft, non-tender and non-distended. No rebound or guarding. No HSM or masses noted. Msk:  Symmetrical without gross deformities. Normal posture. Extremities:  Without edema. Neurologic:  Alert and  oriented x4 Psych:  Normal mood and affect.    Assessment:     Plan:  ***   Ermalinda Memos, PA-C [facility] 04/01/2023

## 2023-03-31 DIAGNOSIS — N186 End stage renal disease: Secondary | ICD-10-CM | POA: Diagnosis not present

## 2023-03-31 DIAGNOSIS — Z992 Dependence on renal dialysis: Secondary | ICD-10-CM | POA: Diagnosis not present

## 2023-04-01 ENCOUNTER — Encounter: Payer: Self-pay | Admitting: Gastroenterology

## 2023-04-01 ENCOUNTER — Ambulatory Visit (INDEPENDENT_AMBULATORY_CARE_PROVIDER_SITE_OTHER): Payer: Medicare Other | Admitting: Gastroenterology

## 2023-04-01 VITALS — BP 134/72 | HR 91 | Temp 97.7°F | Ht 65.0 in | Wt 176.2 lb

## 2023-04-01 DIAGNOSIS — R1310 Dysphagia, unspecified: Secondary | ICD-10-CM | POA: Diagnosis not present

## 2023-04-01 DIAGNOSIS — K219 Gastro-esophageal reflux disease without esophagitis: Secondary | ICD-10-CM | POA: Diagnosis not present

## 2023-04-01 NOTE — Patient Instructions (Signed)
I am glad you are feeling well overall!  Continue pantoprazole 40 mg daily for your reflux.  Monitor for any worsening swallowing trouble and let us know if this occurs.  If you begin to have frequent coughing with eating or feel that foods or liquids are going down the wrong way, we will need to get you to see speech therapy for a special swallow study as you did have some evidence of aspiration on your recent swallow evaluation.  We will plan to see you back in 1 year or sooner if needed.  Ermalinda Memos, PA-C [facility]

## 2023-04-02 DIAGNOSIS — Z992 Dependence on renal dialysis: Secondary | ICD-10-CM | POA: Diagnosis not present

## 2023-04-02 DIAGNOSIS — N186 End stage renal disease: Secondary | ICD-10-CM | POA: Diagnosis not present

## 2023-04-03 ENCOUNTER — Ambulatory Visit: Payer: Medicare Other | Admitting: Gastroenterology

## 2023-04-04 DIAGNOSIS — N186 End stage renal disease: Secondary | ICD-10-CM | POA: Diagnosis not present

## 2023-04-04 DIAGNOSIS — Z992 Dependence on renal dialysis: Secondary | ICD-10-CM | POA: Diagnosis not present

## 2023-04-06 ENCOUNTER — Ambulatory Visit (INDEPENDENT_AMBULATORY_CARE_PROVIDER_SITE_OTHER): Payer: Medicare Other | Admitting: Podiatry

## 2023-04-06 DIAGNOSIS — M2141 Flat foot [pes planus] (acquired), right foot: Secondary | ICD-10-CM

## 2023-04-06 DIAGNOSIS — M2041 Other hammer toe(s) (acquired), right foot: Secondary | ICD-10-CM

## 2023-04-06 DIAGNOSIS — M2042 Other hammer toe(s) (acquired), left foot: Secondary | ICD-10-CM

## 2023-04-06 DIAGNOSIS — M2142 Flat foot [pes planus] (acquired), left foot: Secondary | ICD-10-CM

## 2023-04-06 DIAGNOSIS — E0840 Diabetes mellitus due to underlying condition with diabetic neuropathy, unspecified: Secondary | ICD-10-CM

## 2023-04-06 NOTE — Progress Notes (Signed)
Patient presents today to pick up diabetic shoes and insoles.  Patient was dispensed 1 pair of diabetic shoes and 3 pairs of foam casted diabetic insoles. Fit was unsatisfactory, shoes were too tight to go over swollen feet, and his toes were pushing the end of the shoes. I reordered same style in a size 9xw and 9.5 wide to see which gives the patient a better fit.    Re-appointment for regularly scheduled diabetic foot care visits or if they should experience any trouble with the shoes or insoles.

## 2023-04-07 DIAGNOSIS — Z992 Dependence on renal dialysis: Secondary | ICD-10-CM | POA: Diagnosis not present

## 2023-04-07 DIAGNOSIS — N186 End stage renal disease: Secondary | ICD-10-CM | POA: Diagnosis not present

## 2023-04-09 DIAGNOSIS — Z992 Dependence on renal dialysis: Secondary | ICD-10-CM | POA: Diagnosis not present

## 2023-04-09 DIAGNOSIS — N186 End stage renal disease: Secondary | ICD-10-CM | POA: Diagnosis not present

## 2023-04-11 DIAGNOSIS — Z992 Dependence on renal dialysis: Secondary | ICD-10-CM | POA: Diagnosis not present

## 2023-04-11 DIAGNOSIS — N186 End stage renal disease: Secondary | ICD-10-CM | POA: Diagnosis not present

## 2023-04-12 DIAGNOSIS — N186 End stage renal disease: Secondary | ICD-10-CM | POA: Diagnosis not present

## 2023-04-12 DIAGNOSIS — Z992 Dependence on renal dialysis: Secondary | ICD-10-CM | POA: Diagnosis not present

## 2023-04-14 DIAGNOSIS — N186 End stage renal disease: Secondary | ICD-10-CM | POA: Diagnosis not present

## 2023-04-14 DIAGNOSIS — Z992 Dependence on renal dialysis: Secondary | ICD-10-CM | POA: Diagnosis not present

## 2023-04-16 DIAGNOSIS — Z992 Dependence on renal dialysis: Secondary | ICD-10-CM | POA: Diagnosis not present

## 2023-04-16 DIAGNOSIS — N186 End stage renal disease: Secondary | ICD-10-CM | POA: Diagnosis not present

## 2023-04-18 DIAGNOSIS — Z992 Dependence on renal dialysis: Secondary | ICD-10-CM | POA: Diagnosis not present

## 2023-04-18 DIAGNOSIS — N186 End stage renal disease: Secondary | ICD-10-CM | POA: Diagnosis not present

## 2023-04-21 DIAGNOSIS — N186 End stage renal disease: Secondary | ICD-10-CM | POA: Diagnosis not present

## 2023-04-21 DIAGNOSIS — Z992 Dependence on renal dialysis: Secondary | ICD-10-CM | POA: Diagnosis not present

## 2023-04-23 DIAGNOSIS — N186 End stage renal disease: Secondary | ICD-10-CM | POA: Diagnosis not present

## 2023-04-23 DIAGNOSIS — Z992 Dependence on renal dialysis: Secondary | ICD-10-CM | POA: Diagnosis not present

## 2023-04-25 DIAGNOSIS — N186 End stage renal disease: Secondary | ICD-10-CM | POA: Diagnosis not present

## 2023-04-25 DIAGNOSIS — Z992 Dependence on renal dialysis: Secondary | ICD-10-CM | POA: Diagnosis not present

## 2023-04-28 DIAGNOSIS — Z992 Dependence on renal dialysis: Secondary | ICD-10-CM | POA: Diagnosis not present

## 2023-04-28 DIAGNOSIS — N186 End stage renal disease: Secondary | ICD-10-CM | POA: Diagnosis not present

## 2023-04-30 DIAGNOSIS — N186 End stage renal disease: Secondary | ICD-10-CM | POA: Diagnosis not present

## 2023-04-30 DIAGNOSIS — Z992 Dependence on renal dialysis: Secondary | ICD-10-CM | POA: Diagnosis not present

## 2023-05-02 DIAGNOSIS — Z992 Dependence on renal dialysis: Secondary | ICD-10-CM | POA: Diagnosis not present

## 2023-05-02 DIAGNOSIS — N186 End stage renal disease: Secondary | ICD-10-CM | POA: Diagnosis not present

## 2023-05-04 ENCOUNTER — Other Ambulatory Visit: Payer: Medicare Other

## 2023-05-05 DIAGNOSIS — Z992 Dependence on renal dialysis: Secondary | ICD-10-CM | POA: Diagnosis not present

## 2023-05-05 DIAGNOSIS — N186 End stage renal disease: Secondary | ICD-10-CM | POA: Diagnosis not present

## 2023-05-07 DIAGNOSIS — Z992 Dependence on renal dialysis: Secondary | ICD-10-CM | POA: Diagnosis not present

## 2023-05-07 DIAGNOSIS — N186 End stage renal disease: Secondary | ICD-10-CM | POA: Diagnosis not present

## 2023-05-09 DIAGNOSIS — N186 End stage renal disease: Secondary | ICD-10-CM | POA: Diagnosis not present

## 2023-05-09 DIAGNOSIS — Z992 Dependence on renal dialysis: Secondary | ICD-10-CM | POA: Diagnosis not present

## 2023-05-11 ENCOUNTER — Encounter: Payer: Self-pay | Admitting: Podiatry

## 2023-05-11 ENCOUNTER — Ambulatory Visit (INDEPENDENT_AMBULATORY_CARE_PROVIDER_SITE_OTHER): Payer: Medicare Other | Admitting: Podiatry

## 2023-05-11 DIAGNOSIS — L600 Ingrowing nail: Secondary | ICD-10-CM | POA: Diagnosis not present

## 2023-05-11 NOTE — Progress Notes (Signed)
  Subjective:  Patient ID: Steve [name], male    DOB: 06-29-1936,  MRN: [id number]  Chief Complaint  Patient presents with   Nail Problem    "It looks like my toenails wants to bust open.  They need cut, both big toes."    87 y.o. male presents with the above complaint. History confirmed with patient.   Objective:  Physical Exam: warm, good capillary refill, no trophic changes or ulcerative lesions, normal DP and PT pulses, normal sensory exam, and ingrowing medial lateral bilateral hallux nail without signs of infection  Assessment:   1. Ingrowing right great toenail   2. Ingrowing left great toenail      Plan:  Patient was evaluated and treated and all questions answered.  Ingrowing nails only mild in appearance today.  Debridement slant back fashion.  Return as needed if this continues to worsen  Return if symptoms worsen or fail to improve.

## 2023-05-12 DIAGNOSIS — N186 End stage renal disease: Secondary | ICD-10-CM | POA: Diagnosis not present

## 2023-05-12 DIAGNOSIS — Z992 Dependence on renal dialysis: Secondary | ICD-10-CM | POA: Diagnosis not present

## 2023-05-13 DIAGNOSIS — Z992 Dependence on renal dialysis: Secondary | ICD-10-CM | POA: Diagnosis not present

## 2023-05-13 DIAGNOSIS — N186 End stage renal disease: Secondary | ICD-10-CM | POA: Diagnosis not present

## 2023-05-14 DIAGNOSIS — N186 End stage renal disease: Secondary | ICD-10-CM | POA: Diagnosis not present

## 2023-05-14 DIAGNOSIS — Z992 Dependence on renal dialysis: Secondary | ICD-10-CM | POA: Diagnosis not present

## 2023-05-16 DIAGNOSIS — Z992 Dependence on renal dialysis: Secondary | ICD-10-CM | POA: Diagnosis not present

## 2023-05-16 DIAGNOSIS — N186 End stage renal disease: Secondary | ICD-10-CM | POA: Diagnosis not present

## 2023-05-19 DIAGNOSIS — Z992 Dependence on renal dialysis: Secondary | ICD-10-CM | POA: Diagnosis not present

## 2023-05-19 DIAGNOSIS — N186 End stage renal disease: Secondary | ICD-10-CM | POA: Diagnosis not present

## 2023-05-21 DIAGNOSIS — Z992 Dependence on renal dialysis: Secondary | ICD-10-CM | POA: Diagnosis not present

## 2023-05-21 DIAGNOSIS — N186 End stage renal disease: Secondary | ICD-10-CM | POA: Diagnosis not present

## 2023-05-23 DIAGNOSIS — N186 End stage renal disease: Secondary | ICD-10-CM | POA: Diagnosis not present

## 2023-05-23 DIAGNOSIS — Z992 Dependence on renal dialysis: Secondary | ICD-10-CM | POA: Diagnosis not present

## 2023-05-26 DIAGNOSIS — N186 End stage renal disease: Secondary | ICD-10-CM | POA: Diagnosis not present

## 2023-05-26 DIAGNOSIS — Z992 Dependence on renal dialysis: Secondary | ICD-10-CM | POA: Diagnosis not present

## 2023-05-28 DIAGNOSIS — Z992 Dependence on renal dialysis: Secondary | ICD-10-CM | POA: Diagnosis not present

## 2023-05-28 DIAGNOSIS — N186 End stage renal disease: Secondary | ICD-10-CM | POA: Diagnosis not present

## 2023-05-30 DIAGNOSIS — N186 End stage renal disease: Secondary | ICD-10-CM | POA: Diagnosis not present

## 2023-05-30 DIAGNOSIS — Z992 Dependence on renal dialysis: Secondary | ICD-10-CM | POA: Diagnosis not present

## 2023-06-02 DIAGNOSIS — Z992 Dependence on renal dialysis: Secondary | ICD-10-CM | POA: Diagnosis not present

## 2023-06-02 DIAGNOSIS — N186 End stage renal disease: Secondary | ICD-10-CM | POA: Diagnosis not present

## 2023-06-04 DIAGNOSIS — Z992 Dependence on renal dialysis: Secondary | ICD-10-CM | POA: Diagnosis not present

## 2023-06-04 DIAGNOSIS — N186 End stage renal disease: Secondary | ICD-10-CM | POA: Diagnosis not present

## 2023-06-06 DIAGNOSIS — Z992 Dependence on renal dialysis: Secondary | ICD-10-CM | POA: Diagnosis not present

## 2023-06-06 DIAGNOSIS — N186 End stage renal disease: Secondary | ICD-10-CM | POA: Diagnosis not present

## 2023-06-09 ENCOUNTER — Emergency Department (HOSPITAL_COMMUNITY): Payer: Medicare Other

## 2023-06-09 ENCOUNTER — Other Ambulatory Visit: Payer: Self-pay

## 2023-06-09 ENCOUNTER — Emergency Department (HOSPITAL_COMMUNITY)
Admission: EM | Admit: 2023-06-09 | Discharge: 2023-06-09 | Disposition: A | Discharge status: Home / Self Care | Payer: Medicare Other | Attending: Emergency Medicine | Admitting: Emergency Medicine

## 2023-06-09 DIAGNOSIS — N186 End stage renal disease: Secondary | ICD-10-CM | POA: Diagnosis not present

## 2023-06-09 DIAGNOSIS — J984 Other disorders of lung: Secondary | ICD-10-CM | POA: Diagnosis not present

## 2023-06-09 DIAGNOSIS — Z8546 Personal history of malignant neoplasm of prostate: Secondary | ICD-10-CM | POA: Insufficient documentation

## 2023-06-09 DIAGNOSIS — Z992 Dependence on renal dialysis: Secondary | ICD-10-CM | POA: Diagnosis not present

## 2023-06-09 DIAGNOSIS — Z7984 Long term (current) use of oral hypoglycemic drugs: Secondary | ICD-10-CM | POA: Insufficient documentation

## 2023-06-09 DIAGNOSIS — Z743 Need for continuous supervision: Secondary | ICD-10-CM | POA: Diagnosis not present

## 2023-06-09 DIAGNOSIS — Z7982 Long term (current) use of aspirin: Secondary | ICD-10-CM | POA: Diagnosis not present

## 2023-06-09 DIAGNOSIS — R0902 Hypoxemia: Secondary | ICD-10-CM | POA: Insufficient documentation

## 2023-06-09 DIAGNOSIS — J810 Acute pulmonary edema: Secondary | ICD-10-CM | POA: Diagnosis not present

## 2023-06-09 DIAGNOSIS — Z87891 Personal history of nicotine dependence: Secondary | ICD-10-CM | POA: Diagnosis not present

## 2023-06-09 DIAGNOSIS — R0689 Other abnormalities of breathing: Secondary | ICD-10-CM | POA: Diagnosis not present

## 2023-06-09 DIAGNOSIS — R000 Tachycardia, unspecified: Secondary | ICD-10-CM | POA: Diagnosis not present

## 2023-06-09 DIAGNOSIS — I120 Hypertensive chronic kidney disease with stage 5 chronic kidney disease or end stage renal disease: Secondary | ICD-10-CM | POA: Diagnosis not present

## 2023-06-09 DIAGNOSIS — E1122 Type 2 diabetes mellitus with diabetic chronic kidney disease: Secondary | ICD-10-CM | POA: Insufficient documentation

## 2023-06-09 DIAGNOSIS — R059 Cough, unspecified: Secondary | ICD-10-CM | POA: Diagnosis not present

## 2023-06-09 DIAGNOSIS — Z1152 Encounter for screening for COVID-19: Secondary | ICD-10-CM | POA: Diagnosis not present

## 2023-06-09 DIAGNOSIS — J90 Pleural effusion, not elsewhere classified: Secondary | ICD-10-CM | POA: Diagnosis not present

## 2023-06-09 DIAGNOSIS — N2581 Secondary hyperparathyroidism of renal origin: Secondary | ICD-10-CM | POA: Diagnosis not present

## 2023-06-09 DIAGNOSIS — I517 Cardiomegaly: Secondary | ICD-10-CM | POA: Diagnosis not present

## 2023-06-09 DIAGNOSIS — R0989 Other specified symptoms and signs involving the circulatory and respiratory systems: Secondary | ICD-10-CM | POA: Diagnosis not present

## 2023-06-09 DIAGNOSIS — R918 Other nonspecific abnormal finding of lung field: Secondary | ICD-10-CM | POA: Diagnosis not present

## 2023-06-09 DIAGNOSIS — I10 Essential (primary) hypertension: Secondary | ICD-10-CM | POA: Diagnosis not present

## 2023-06-09 DIAGNOSIS — Z794 Long term (current) use of insulin: Secondary | ICD-10-CM | POA: Diagnosis not present

## 2023-06-09 DIAGNOSIS — D631 Anemia in chronic kidney disease: Secondary | ICD-10-CM | POA: Diagnosis not present

## 2023-06-09 DIAGNOSIS — R0602 Shortness of breath: Secondary | ICD-10-CM | POA: Diagnosis not present

## 2023-06-09 LAB — COMPREHENSIVE METABOLIC PANEL
ALT: 16 U/L (ref 0–44)
AST: 22 U/L (ref 15–41)
Albumin: 3.6 g/dL (ref 3.5–5.0)
Alkaline Phosphatase: 79 U/L (ref 38–126)
Anion gap: 14 (ref 5–15)
BUN: 28 mg/dL — ABNORMAL HIGH (ref 8–23)
CO2: 21 mmol/L — ABNORMAL LOW (ref 22–32)
Calcium: 8.6 mg/dL — ABNORMAL LOW (ref 8.9–10.3)
Chloride: 103 mmol/L (ref 98–111)
Creatinine, Ser: 6.26 mg/dL — ABNORMAL HIGH (ref 0.61–1.24)
GFR, Estimated: 8 mL/min — ABNORMAL LOW (ref >60)
Glucose, Bld: 149 mg/dL — ABNORMAL HIGH (ref 70–99)
Potassium: 3.4 mmol/L — ABNORMAL LOW (ref 3.5–5.1)
Sodium: 138 mmol/L (ref 135–145)
Total Bilirubin: 0.6 mg/dL (ref 0.3–1.2)
Total Protein: 7.5 g/dL (ref 6.5–8.1)

## 2023-06-09 LAB — CBG MONITORING, ED: Glucose-Capillary: 131 mg/dL — ABNORMAL HIGH (ref 70–99)

## 2023-06-09 LAB — CBC WITH DIFFERENTIAL/PLATELET
Abs Immature Granulocytes: 0.02 10*3/uL (ref 0.00–0.07)
Basophils Absolute: 0 10*3/uL (ref 0.0–0.1)
Basophils Relative: 1 %
Eosinophils Absolute: 0.1 10*3/uL (ref 0.0–0.5)
Eosinophils Relative: 1 %
HCT: 29.9 % — ABNORMAL LOW (ref 39.0–52.0)
Hemoglobin: 10.1 g/dL — ABNORMAL LOW (ref 13.0–17.0)
Immature Granulocytes: 0 %
Lymphocytes Relative: 14 %
Lymphs Abs: 0.8 10*3/uL (ref 0.7–4.0)
MCH: 26.7 pg (ref 26.0–34.0)
MCHC: 33.8 g/dL (ref 30.0–36.0)
MCV: 79.1 fL — ABNORMAL LOW (ref 80.0–100.0)
Monocytes Absolute: 0.5 10*3/uL (ref 0.1–1.0)
Monocytes Relative: 8 %
Neutro Abs: 4.5 10*3/uL (ref 1.7–7.7)
Neutrophils Relative %: 76 %
Platelets: 236 10*3/uL (ref 150–400)
RBC: 3.78 MIL/uL — ABNORMAL LOW (ref 4.22–5.81)
RDW: 14.9 % (ref 11.5–15.5)
WBC: 5.9 10*3/uL (ref 4.0–10.5)
nRBC: 0 % (ref 0.0–0.2)

## 2023-06-09 LAB — HEPATITIS B SURFACE ANTIGEN: Hepatitis B Surface Ag: NONREACTIVE

## 2023-06-09 LAB — SARS CORONAVIRUS 2 BY RT PCR: SARS Coronavirus 2 by RT PCR: NEGATIVE

## 2023-06-09 MED ORDER — HEPARIN SODIUM (PORCINE) 1000 UNIT/ML IJ SOLN
INTRAMUSCULAR | Status: AC
  Filled 2023-06-09: qty 2

## 2023-06-09 MED ORDER — CHLORHEXIDINE GLUCONATE CLOTH 2 % EX PADS: 6.0000 | MEDICATED_PAD | Freq: Every day | CUTANEOUS | Status: DC

## 2023-06-09 MED ORDER — CALCITRIOL 0.25 MCG PO CAPS
ORAL_CAPSULE | ORAL | Status: AC
  Filled 2023-06-09: qty 2

## 2023-06-09 MED ORDER — HEPARIN SODIUM (PORCINE) 1000 UNIT/ML DIALYSIS: 1000.0000 [IU] | INTRAMUSCULAR | Status: DC | PRN

## 2023-06-09 MED ORDER — CALCITRIOL 0.25 MCG PO CAPS
0.5000 ug | ORAL_CAPSULE | ORAL | Status: DC
  Administered 2023-06-09: 0.5 ug via ORAL

## 2023-06-09 MED ORDER — SODIUM CHLORIDE 0.9 % IV SOLN
50.0000 mg | Freq: Once | INTRAVENOUS | Status: DC
  Filled 2023-06-09: qty 2.5

## 2023-06-09 NOTE — Consult Note (Signed)
ESRD Consult Note  Reason for consult: ESRD, provision of dialysis  Assessment/Recommendations:  ESRD  -outpatient HD orders: [facility].  4 hours.  EDW 77.5 kg.  AVG LUE.  Flow rates: 400/500.  2K/2.5 calcium.  Heparin: 1000 units loading bolus, 2100 units/hr.  Calcitriol 0.5 mcg with treatment.  Venofer 50 mg (due today) -HD today on TTS schedule  SOB -pulm edema vs PNA? To be reevaluated by ER MD post HD. COVID negative -Will UF as tolerated  HTN/Volume -UF as tolerated, resume home anti-HTNs  Anemia of Chronic Kidney Disease -Hemoglobin 10.1. Will give his dose of venofer today (due today).  -Transfuse PRN for Hgb <7  Secondary Hyperparathyroidism/Hyperphosphatemia - will resume calcitriol -resume home binders if on any   Recommendations were discussed with the primary team.  Anthony Sar, MD [facility]  History of Present Illness: Steve [name] is a/an 87 y.o. male with a past medical history of ESRD, HTN, DM2 who presents with SOB. Does HD TTS, last HD on Sat. Consult placed for HD which is due today. Patient to reevaluated after HD to see if he can be discharged from ER. Patient seen and examined in ER. He reports that his breathing is about the same but is hungry. No other complaints   Medications:  Current Facility-Administered Medications  Medication Dose Route Frequency Provider Last Rate Last Admin   Chlorhexidine Gluconate Cloth 2 % PADS 6 each  6 each Topical Q0600 Maxie Barb, MD       Current Outpatient Medications  Medication Sig Dispense Refill   Accu-Chek Softclix Lancets lancets      aspirin 81 MG tablet Take 81 mg by mouth daily.     gabapentin (NEURONTIN) 100 MG capsule Take 100 mg by mouth daily.     glucose blood (ACCU-CHEK AVIVA PLUS) test strip      hydrALAZINE (APRESOLINE) 25 MG tablet Take 25 mg by mouth 3 (three) times daily.     Lancets Misc. (ACCU-CHEK SOFTCLIX LANCET DEV) KIT      lidocaine-prilocaine (EMLA)  cream SMARTSIG:1 Topical     linagliptin (TRADJENTA) 5 MG TABS tablet Take 5 mg by mouth daily.     loratadine (CLARITIN) 10 MG tablet Take 10 mg by mouth daily.     metoprolol tartrate (LOPRESSOR) 25 MG tablet Take 1 tablet (25 mg total) by mouth 2 (two) times daily.     Multiple Vitamin (MULTIVITAMIN WITH MINERALS) TABS tablet Take 1 tablet by mouth daily.     NIFEdipine (PROCARDIA XL/ADALAT-CC) 90 MG 24 hr tablet TAKE (1) TABLET BY MOUTH ONCE DAILY. (Patient taking differently: Take 90 mg by mouth daily before breakfast.) 30 tablet 6   pantoprazole (PROTONIX) 40 MG tablet Take 1 tablet (40 mg total) by mouth daily before breakfast. 30 tablet 11   phosphorus (K PHOS NEUTRAL) [phone] MG tablet Take by mouth 2 (two) times daily.     tamsulosin (FLOMAX) 0.4 MG CAPS capsule Take by mouth daily.     zolpidem (AMBIEN) 5 MG tablet Take 5 mg by mouth at bedtime as needed for sleep.       ALLERGIES Patient has no known allergies.  MEDICAL HISTORY Past Medical History:  Diagnosis Date   Anemia    Arthritis    ESRD (end stage renal disease) on dialysis [facility])    T/TH/Sat   Essential hypertension    GERD (gastroesophageal reflux disease)    Hyperlipidemia    Prostate cancer (HCC)    took radiation  Type 2 diabetes mellitus (HCC)      SOCIAL HISTORY Social History   Socioeconomic History   Marital status: Divorced    Spouse name: Not on file   Number of children: Not on file   Years of education: Not on file   Highest education level: Not on file  Occupational History   Occupation: Retired  Tobacco Use   Smoking status: Former    Current packs/day: 0.00    Average packs/day: 2.0 packs/day for 32.9 years (65.9 ttl pk-yrs)    Types: Pipe, Cigars, Cigarettes    Start date: 11/08/1954    Quit date: 10/14/1987    Years since quitting: 35.6   Smokeless tobacco: Never   Tobacco comments:    About 2 pipes a day  Vaping Use   Vaping status: Never Used  Substance and Sexual Activity    Alcohol use: No    Alcohol/week: 0.0 standard drinks of alcohol   Drug use: No   Sexual activity: Not Currently  Other Topics Concern   Not on file  Social History Narrative   Not on file   Social Determinants of Health   Financial Resource Strain: Not on file  Food Insecurity: Not on file  Transportation Needs: Not on file  Physical Activity: Not on file  Stress: Not on file  Social Connections: Unknown (02/24/2022)   [facility]   Social Network    Social Network: Not on file  Intimate Partner Violence: Unknown (01/16/2022)   Received from [facility]   HITS    Physically Hurt: Not on file    Insult or Talk Down To: Not on file    Threaten Physical Harm: Not on file    Scream or Curse: Not on file     FAMILY HISTORY Family History  Problem Relation Age of Onset   Heart disease Mother        cause of death   Diabetes Sister    Colon cancer Neg Hx      Review of Systems: 12 systems were reviewed and negative except per HPI  Physical Exam: Vitals:   06/09/23 0515 06/09/23 0600  BP: (!) 141/55 (!) 192/80  Pulse: 86 90  Resp: 20   Temp:    SpO2: 93% 91%   No intake/output data recorded. No intake or output data in the 24 hours ending 06/09/23 0813 General: well-appearing, no acute distress HEENT: anicteric sclera, MMM CV: normal rate, no murmurs, no edema Lungs: CTA BL, no w/r/r/c, bilateral chest rise, normal wob Abd: soft, non-tender, non-distended Skin: no visible lesions or rashes Ext: trace ankle edema b/l Neuro: normal speech, no gross focal deficits  Dialysis access: LUE AVG +b/t  Test Results Reviewed Lab Results  Component Value Date   NA 138 06/09/2023   K 3.4 (L) 06/09/2023   CL 103 06/09/2023   CO2 21 (L) 06/09/2023   BUN 28 (H) 06/09/2023   CREATININE 6.26 (H) 06/09/2023   CALCIUM 8.6 (L) 06/09/2023   ALBUMIN 3.6 06/09/2023   PHOS 4.2 12/11/2020    I have reviewed relevant outside healthcare records

## 2023-06-09 NOTE — ED Triage Notes (Signed)
Pt arrives via [facility] from home c/o SOB that began Saturday, states its worse with exertion. Also reports cough and congestion.   Pt is on dialysis Sat, Tues, Thurs.

## 2023-06-09 NOTE — ED Provider Notes (Signed)
4:48 PM Patient no distress, awake, alert, on returning from dialysis.  Lung sounds are clear, patient comfortable with discharge, as is his son.   Gerhard Munch, MD 06/09/23 [phone]

## 2023-06-09 NOTE — ED Provider Notes (Signed)
[place] EMERGENCY DEPARTMENT AT [facility] Provider Note   CSN: [id number] Arrival date & time: 06/09/23  0148     History  Chief Complaint  Patient presents with   Shortness of Breath    Steve [name] is a 87 y.o. male.  87 year old male who presents the ER today secondary dyspnea.  Patient is a dialysis patient has had pulmonary edema before he feels like this is similar.  States that he did get dialysis on Saturday as scheduled and is scheduled for dialysis on Tuesday as well.  No fever or productive cough. Mild le edema as well.   Shortness of Breath      Home Medications Prior to Admission medications   Medication Sig Start Date End Date Taking? Authorizing Provider  Accu-Chek Softclix Lancets lancets  10/27/19   [provider]  aspirin 81 MG tablet Take 81 mg by mouth daily.    [provider]  gabapentin (NEURONTIN) 100 MG capsule Take 100 mg by mouth daily.    [provider]  glucose blood (ACCU-CHEK AVIVA PLUS) test strip  11/27/17   [provider]  hydrALAZINE (APRESOLINE) 25 MG tablet Take 25 mg by mouth 3 (three) times daily.    [provider]  Lancets Misc. (ACCU-CHEK SOFTCLIX LANCET DEV) KIT  10/28/19   [provider]  lidocaine-prilocaine (EMLA) cream SMARTSIG:1 Topical 07/18/22   [provider]  linagliptin (TRADJENTA) 5 MG TABS tablet Take 5 mg by mouth daily.    [provider]  loratadine (CLARITIN) 10 MG tablet Take 10 mg by mouth daily. 08/04/22   [provider]  metoprolol tartrate (LOPRESSOR) 25 MG tablet Take 1 tablet (25 mg total) by mouth 2 (two) times daily. 04/18/20   Vassie Loll, MD  Multiple Vitamin (MULTIVITAMIN WITH MINERALS) TABS tablet Take 1 tablet by mouth daily.    [provider]  NIFEdipine (PROCARDIA XL/ADALAT-CC) 90 MG 24 hr tablet TAKE (1) TABLET BY MOUTH ONCE DAILY. Patient taking differently: Take 90 mg by mouth daily before  breakfast. 06/17/18   Laqueta Linden, MD  pantoprazole (PROTONIX) 40 MG tablet Take 1 tablet (40 mg total) by mouth daily before breakfast. 12/31/22   Tiffany Kocher, PA-C  phosphorus (K PHOS NEUTRAL) [phone] MG tablet Take by mouth 2 (two) times daily.    [provider]  tamsulosin (FLOMAX) 0.4 MG CAPS capsule Take by mouth daily. 09/02/22   [provider]  zolpidem (AMBIEN) 5 MG tablet Take 5 mg by mouth at bedtime as needed for sleep. 07/11/19   [provider]      Allergies    Patient has no known allergies.    Review of Systems   Review of Systems  Respiratory:  Positive for shortness of breath.     Physical Exam Updated Vital Signs BP (!) 192/80   Pulse 90   Temp 97.6 F (36.4 C) (Oral)   Resp 20   Ht 5\' 5"  (1.651 m)   Wt 74.8 kg   SpO2 91%   BMI 27.46 kg/m  Physical Exam Vitals and nursing note reviewed.  Constitutional:      Appearance: He is well-developed.  HENT:     Head: Normocephalic and atraumatic.  Cardiovascular:     Rate and Rhythm: Normal rate.  Pulmonary:     Effort: Pulmonary effort is normal. Tachypnea present. No respiratory distress.     Breath sounds: No rales.     Comments: Hypoxic to high 80's  Abdominal:     General: There is no distension.  Musculoskeletal:        General: Normal range of motion.     Cervical back: Normal range of motion.  Skin:    General: Skin is warm and dry.  Neurological:     General: No focal deficit present.     Mental Status: He is alert.     ED Results / Procedures / Treatments   Labs (all labs ordered are listed, but only abnormal results are displayed) Labs Reviewed  CBC WITH DIFFERENTIAL/PLATELET - Abnormal; Notable for the following components:      Result Value   RBC 3.78 (*)    Hemoglobin 10.1 (*)    HCT 29.9 (*)    MCV 79.1 (*)    All other components within normal limits  COMPREHENSIVE METABOLIC PANEL - Abnormal; Notable for the following components:    Potassium 3.4 (*)    CO2 21 (*)    Glucose, Bld 149 (*)    BUN 28 (*)    Creatinine, Ser 6.26 (*)    Calcium 8.6 (*)    GFR, Estimated 8 (*)    All other components within normal limits  SARS CORONAVIRUS 2 BY RT PCR    EKG None  Radiology DG Chest 2 View  Result Date: 06/09/2023 CLINICAL DATA:  Shortness of breath. EXAM: CHEST - 2 VIEW COMPARISON:  05/29/2022. FINDINGS: The heart is enlarged and the mediastinal contour is stable. Atherosclerotic calcification of the aorta is noted. Lung volumes are low with airspace disease at the lung bases bilaterally. There are small bilateral pleural effusions. No pneumothorax. No acute osseous abnormality is seen. IMPRESSION: 1. Patchy airspace disease at the lung bases, possible atelectasis or infiltrate. 2. Small bilateral pleural effusions. 3. Cardiomegaly. Electronically Signed   By: Thornell Sartorius M.D.   On: 06/09/2023 02:55    Procedures Procedures    Medications Ordered in ED Medications - No data to display  ED Course/ Medical Decision Making/ A&P                                 Medical Decision Making Amount and/or Complexity of Data Reviewed Labs: ordered. Radiology: ordered.   Suspect pulmonary edema fluid overload as a cause for his shortness of breath.  X-ray does show some opacities as asymmetric however without a fever or productive cough I think is unlikely has pneumonia.  And was seen the other indication to admit him to the hospital.  I discussed with Dr. Ronalee Belts with nephrology who will dialyze him later in the morning and return him to the ER.  If his hypoxia, shortness of breath and cough have all resolved I feel he is safe for discharge but will need reevaluation after dialysis.  Does not make urine so no indication for diuretics right now.  Blood pressure is a bit high but I suspect this will improve with his dialysis. Will make oncoming team aware of need for reevaluation.  Final Clinical Impression(s) / ED  Diagnoses Final diagnoses:  None    Rx / DC Orders ED Discharge Orders     None         Marlina Cataldi, Barbara Cower, MD 06/09/23 [phone]

## 2023-06-09 NOTE — Progress Notes (Signed)
   HEMODIALYSIS TREATMENT NOTE:  Uneventful 3.5 hour low heparin treatment completed using left forearm loop AVG (15g/retrograde flow).  Did not meet the 3.5 - 4 liter goal attempted due to cramping and decline in BP (asymptomatic), but he is saturating 97% on room air and reports work of breathing is improved.  Net UF 2.7 liters.    06/09/23 1557  Vitals  Temp 98 F (36.7 C)  Temp Source Oral  BP (!) 159/73  MAP (mmHg) 100  BP Location Right Arm  BP Method Automatic  Patient Position (if appropriate) Sitting  Pulse Rate 85  Pulse Rate Source Monitor  ECG Heart Rate 84  Resp 18  Oxygen Therapy  SpO2 97 %  O2 Device Room Air  Post Treatment  Dialyzer Clearance Lightly streaked  Hemodialysis Intake (mL) 100 mL  Liters Processed 73.2  Fluid Removed (mL) 2700 mL  Tolerated HD Treatment No (Comment)  Post-Hemodialysis Comments Unable to meet 3.5 L goal d/t cramping and declining BP.  AVG/AVF Arterial Site Held (minutes) 7 minutes  AVG/AVF Venous Site Held (minutes) 7 minutes  Fistula / Graft Left Forearm Arteriovenous vein graft  Placement Date/Time: 02/21/21 1610   Placed prior to admission: No  Orientation: Left  Access Location: Forearm  Access Type: (c) Arteriovenous vein graft  Fistula / Graft Assessment Thrill;Bruit  Status Patent    Pt was transported back to ED to await disposition.  Hand-off given to Georgiann Mohs, RN.   Arman Filter, RN [facility]

## 2023-06-09 NOTE — ED Provider Notes (Signed)
  Physical Exam  BP (!) 192/80   Pulse 90   Temp 97.6 F (36.4 C) (Oral)   Resp 20   Ht 5\' 5"  (1.651 m)   Wt 74.8 kg   SpO2 91%   BMI 27.46 kg/m   Physical Exam  Procedures  Procedures  ED Course / MDM   Clinical Course as of 06/09/23 2010  Tue Jun 09, 2023  0725 Assumed care from Dr Clayborne Dana. 87 yo M on TTSa iHD who presented with shortness of breath since yesterday. Doesn't appear grossly volume overloaded. CXR with PNA vs interstitial markings but doesn't have any other infectious symptoms so it is thought to be due to volume. If iHD doesn't resolve his hypoxia or tachypnea may need to treat him from pneumonia.  [RP]  1610 Patient has not yet returned from dialysis.  It appears that the dialysis nurse was able to wean him to room air.  Signed out to Dr Jeraldine Loots awaiting reevaluation. [RP]    Clinical Course User Index [RP] Rondel Baton, MD   Medical Decision Making Amount and/or Complexity of Data Reviewed Labs: ordered. Radiology: ordered.     Rondel Baton, MD 06/09/23 2010

## 2023-06-09 NOTE — Discharge Instructions (Signed)
Patient to follow-up at your dialysis center on Thursday.  Return here for concerning changes in your condition.

## 2023-06-10 LAB — HEPATITIS B SURFACE ANTIBODY, QUANTITATIVE: Hep B S AB Quant (Post): 823 m[IU]/mL

## 2023-06-11 DIAGNOSIS — N186 End stage renal disease: Secondary | ICD-10-CM | POA: Diagnosis not present

## 2023-06-11 DIAGNOSIS — Z992 Dependence on renal dialysis: Secondary | ICD-10-CM | POA: Diagnosis not present

## 2023-06-13 DIAGNOSIS — N186 End stage renal disease: Secondary | ICD-10-CM | POA: Diagnosis not present

## 2023-06-13 DIAGNOSIS — Z992 Dependence on renal dialysis: Secondary | ICD-10-CM | POA: Diagnosis not present

## 2023-06-18 DIAGNOSIS — N186 End stage renal disease: Secondary | ICD-10-CM | POA: Diagnosis not present

## 2023-06-18 DIAGNOSIS — Z992 Dependence on renal dialysis: Secondary | ICD-10-CM | POA: Diagnosis not present

## 2023-06-20 DIAGNOSIS — Z992 Dependence on renal dialysis: Secondary | ICD-10-CM | POA: Diagnosis not present

## 2023-06-20 DIAGNOSIS — N186 End stage renal disease: Secondary | ICD-10-CM | POA: Diagnosis not present

## 2023-06-22 DIAGNOSIS — I10 Essential (primary) hypertension: Secondary | ICD-10-CM | POA: Diagnosis not present

## 2023-06-22 DIAGNOSIS — E785 Hyperlipidemia, unspecified: Secondary | ICD-10-CM | POA: Diagnosis not present

## 2023-06-22 DIAGNOSIS — K219 Gastro-esophageal reflux disease without esophagitis: Secondary | ICD-10-CM | POA: Diagnosis not present

## 2023-06-22 DIAGNOSIS — Z23 Encounter for immunization: Secondary | ICD-10-CM | POA: Diagnosis not present

## 2023-06-22 DIAGNOSIS — E1165 Type 2 diabetes mellitus with hyperglycemia: Secondary | ICD-10-CM | POA: Diagnosis not present

## 2023-06-22 DIAGNOSIS — Z992 Dependence on renal dialysis: Secondary | ICD-10-CM | POA: Diagnosis not present

## 2023-06-22 DIAGNOSIS — M1990 Unspecified osteoarthritis, unspecified site: Secondary | ICD-10-CM | POA: Diagnosis not present

## 2023-06-23 ENCOUNTER — Telehealth: Payer: Self-pay

## 2023-06-23 DIAGNOSIS — Z992 Dependence on renal dialysis: Secondary | ICD-10-CM | POA: Diagnosis not present

## 2023-06-23 DIAGNOSIS — N186 End stage renal disease: Secondary | ICD-10-CM | POA: Diagnosis not present

## 2023-06-23 NOTE — Telephone Encounter (Signed)
Transition Care Management Follow-up Telephone Call Date of discharge and from where: 06/09/2023 [facility] How have you been since you were released from the hospital? Patient is feeling better. Any questions or concerns? No  Items Reviewed: Did the pt receive and understand the discharge instructions provided? Yes  Medications obtained and verified?  No medication prescribed. Other? No  Any new allergies since your discharge? No  Dietary orders reviewed? Yes Do you have support at home? Yes   Follow up appointments reviewed:  PCP Hospital f/u appt confirmed? No  Scheduled to see  on  @ . Specialist Hospital f/u appt confirmed? Yes  Scheduled to see Victorino Sparrow, MD on 07/03/2023 @ [facility] at [facility]. Are transportation arrangements needed? No  If their condition worsens, is the pt aware to call PCP or go to the Emergency Dept.? Yes Was the patient provided with contact information for the PCP's office or ED? Yes Was to pt encouraged to call back with questions or concerns? Yes  [name]  [facility] Guide Direct Dial: [phone]  Website: [email or web address]

## 2023-06-24 ENCOUNTER — Other Ambulatory Visit: Payer: Self-pay

## 2023-06-24 DIAGNOSIS — R609 Edema, unspecified: Secondary | ICD-10-CM

## 2023-06-25 DIAGNOSIS — N186 End stage renal disease: Secondary | ICD-10-CM | POA: Diagnosis not present

## 2023-06-25 DIAGNOSIS — Z992 Dependence on renal dialysis: Secondary | ICD-10-CM | POA: Diagnosis not present

## 2023-06-27 DIAGNOSIS — N186 End stage renal disease: Secondary | ICD-10-CM | POA: Diagnosis not present

## 2023-06-27 DIAGNOSIS — Z992 Dependence on renal dialysis: Secondary | ICD-10-CM | POA: Diagnosis not present

## 2023-06-30 DIAGNOSIS — N186 End stage renal disease: Secondary | ICD-10-CM | POA: Diagnosis not present

## 2023-06-30 DIAGNOSIS — Z992 Dependence on renal dialysis: Secondary | ICD-10-CM | POA: Diagnosis not present

## 2023-07-02 DIAGNOSIS — N186 End stage renal disease: Secondary | ICD-10-CM | POA: Diagnosis not present

## 2023-07-02 DIAGNOSIS — Z992 Dependence on renal dialysis: Secondary | ICD-10-CM | POA: Diagnosis not present

## 2023-07-02 NOTE — Progress Notes (Signed)
Office Note     CC: Lower extremity edema Requesting Provider:  Mosetta Pigeon, MD  HPI: [name] is a 87 y.o. (April 30, 1936) male who presents at the request of [name], [place], MD for evaluation of bilateral lower extremity edema.  Patient is known to our office having previously undergone left forearm AV loop graft by my partner Dr. Tawanna Cooler [name].  On exam today, [name] was doing well.  A native of [name], he moved to [name] years ago for work.  He now lives in [name].  He continues to live an active, independent lifestyle.  [name] has appreciated bilateral lower extremity swelling, right greater than left for a number of years.  He stated this happened initially after a fall from standing in which she was found down roughly 2 years ago.  Since that time, he has noted swelling that is worse by days end.  In the mornings, his legs are normal in size, however when in the dependent position, his ankles and feet swell.  The swelling never reaches the knee.  When he props his legs up, the swelling decreases.   He denies varicosities, bleeding or ulceration.  He states that his legs are heavy and tired by days and.  No history of DVT.  No history of venous procedure.   Past Medical History:  Diagnosis Date   Anemia    Arthritis    ESRD (end stage renal disease) on dialysis [facility])    T/TH/Sat   Essential hypertension    GERD (gastroesophageal reflux disease)    Hyperlipidemia    Prostate cancer (HCC)    took radiation   Type 2 diabetes mellitus (HCC)     Past Surgical History:  Procedure Laterality Date   ANTERIOR CERVICAL DECOMP/DISCECTOMY FUSION N/A 08/23/2020   Procedure: Cervical 3-4 Anterior cervical decompression/discectomy/fusion;  Surgeon: Maeola Harman, MD;  Location: [facility] OR;  Service: Neurosurgery;  Laterality: N/A;  3C/RM 21 to follow   AV FISTULA PLACEMENT Left 02/21/2021   Procedure: INSERTION OF ARTERIOVENOUS GORE-TEX GRAFT LEFT ARM;  Surgeon: Larina Earthly, MD;  Location: [facility] ORS;  Service: Vascular;  Laterality: Left;   BIOPSY  10/22/2022   Procedure: BIOPSY;  Surgeon: Corbin Ade, MD;  Location: [facility] ENDO SUITE;  Service: Endoscopy;;   CATARACT EXTRACTION W/PHACO Right 04/28/2016   Procedure: CATARACT EXTRACTION PHACO AND INTRAOCULAR LENS PLACEMENT (IOC);  Surgeon: Susa Simmonds, MD;  Location: [facility] ORS;  Service: Ophthalmology;  Laterality: Right;  CDE: 4.28   COLONOSCOPY     about 2 years in [name] around 2016   CYSTECTOMY     pt. denies   ESOPHAGOGASTRODUODENOSCOPY (EGD) WITH PROPOFOL N/A 10/22/2022   Procedure: ESOPHAGOGASTRODUODENOSCOPY (EGD) WITH PROPOFOL;  Surgeon: Corbin Ade, MD;  [facility];  Service: Endoscopy;  Laterality: N/A;  12:30 pm, pt know to arrive at 9:15, LM to see if pt can come earlier   IR FLUORO GUIDE CV LINE LEFT  12/20/2020   IR US GUIDE VASC ACCESS LEFT  12/20/2020   MALONEY DILATION N/A 10/22/2022   Procedure: Elease Hashimoto DILATION;  Surgeon: Corbin Ade, MD;  Location: [facility];  Service: Endoscopy;  Laterality: N/A;    Social History   Socioeconomic History   Marital status: Divorced    Spouse name: Not on file   Number of children: Not on file   Years of education: Not on file   Highest education level: Not on file  Occupational History   Occupation:  Retired  Tobacco Use   Smoking status: Former    Current packs/day: 0.00    Average packs/day: 2.0 packs/day for 32.9 years (65.9 ttl pk-yrs)    Types: Pipe, Cigars, Cigarettes    Start date: 11/08/1954    Quit date: 10/14/1987    Years since quitting: 35.7   Smokeless tobacco: Never   Tobacco comments:    About 2 pipes a day  Vaping Use   Vaping status: Never Used  Substance and Sexual Activity   Alcohol use: No    Alcohol/week: 0.0 standard drinks of alcohol   Drug use: No   Sexual activity: Not Currently  Other Topics Concern   Not on file  Social History Narrative   Not on file   Social Determinants of Health    Financial Resource Strain: Not on file  Food Insecurity: Not on file  Transportation Needs: Not on file  Physical Activity: Not on file  Stress: Not on file  Social Connections: Unknown (02/24/2022)   Received from [facility], [facility]   Social Network    Social Network: Not on file  Intimate Partner Violence: Unknown (01/16/2022)   Received from [facility], [facility]   HITS    Physically Hurt: Not on file    Insult or Talk Down To: Not on file    Threaten Physical Harm: Not on file    Scream or Curse: Not on file   Family History  Problem Relation Age of Onset   Heart disease Mother        cause of death   Diabetes Sister    Colon cancer Neg Hx     Current Outpatient Medications  Medication Sig Dispense Refill   Accu-Chek Softclix Lancets lancets      aspirin 81 MG tablet Take 81 mg by mouth daily.     gabapentin (NEURONTIN) 100 MG capsule Take 100 mg by mouth daily.     glucose blood (ACCU-CHEK AVIVA PLUS) test strip      hydrALAZINE (APRESOLINE) 25 MG tablet Take 25 mg by mouth 3 (three) times daily.     Lancets Misc. (ACCU-CHEK SOFTCLIX LANCET DEV) KIT      lidocaine-prilocaine (EMLA) cream SMARTSIG:1 Topical     linagliptin (TRADJENTA) 5 MG TABS tablet Take 5 mg by mouth daily.     loratadine (CLARITIN) 10 MG tablet Take 10 mg by mouth daily.     metoprolol tartrate (LOPRESSOR) 25 MG tablet Take 1 tablet (25 mg total) by mouth 2 (two) times daily.     Multiple Vitamin (MULTIVITAMIN WITH MINERALS) TABS tablet Take 1 tablet by mouth daily.     NIFEdipine (PROCARDIA XL/ADALAT-CC) 90 MG 24 hr tablet TAKE (1) TABLET BY MOUTH ONCE DAILY. (Patient taking differently: Take 90 mg by mouth daily before breakfast.) 30 tablet 6   pantoprazole (PROTONIX) 40 MG tablet Take 1 tablet (40 mg total) by mouth daily before breakfast. 30 tablet 11   phosphorus (K PHOS NEUTRAL) 155-852-130 MG tablet Take by mouth 2 (two) times daily.     tamsulosin (FLOMAX) 0.4 MG CAPS capsule  Take by mouth daily.     zolpidem (AMBIEN) 5 MG tablet Take 5 mg by mouth at bedtime as needed for sleep.     No current facility-administered medications for this visit.    No Known Allergies   REVIEW OF SYSTEMS:  [X]  denotes positive finding, [ ]  denotes negative finding Cardiac  Comments:  Chest pain or chest pressure:    Shortness  of breath upon exertion:    Short of breath when lying flat:    Irregular heart rhythm:        Vascular    Pain in calf, thigh, or hip brought on by ambulation:    Pain in feet at night that wakes you up from your sleep:     Blood clot in your veins:    Leg swelling:  X       Pulmonary    Oxygen at home:    Productive cough:     Wheezing:         Neurologic    Sudden weakness in arms or legs:     Sudden numbness in arms or legs:     Sudden onset of difficulty speaking or slurred speech:    Temporary loss of vision in one eye:     Problems with dizziness:         Gastrointestinal    Blood in stool:     Vomited blood:         Genitourinary    Burning when urinating:     Blood in urine:        Psychiatric    Major depression:         Hematologic    Bleeding problems:    Problems with blood clotting too easily:        Skin    Rashes or ulcers:        Constitutional    Fever or chills:      PHYSICAL EXAMINATION:  There were no vitals filed for this visit.  General:  WDWN in NAD; vital signs documented above Gait: Not observed HENT: WNL, normocephalic Pulmonary: normal non-labored breathing , without Rales, rhonchi,  wheezing Cardiac: regular HR Abdomen: soft, NT, no masses Skin: without rashes Vascular Exam/Pulses:  Right Left                  DP 2+ (normal) 2+ (normal)       Extremities: without ischemic changes, without Gangrene , without cellulitis; without open wounds;  Musculoskeletal: no muscle wasting or atrophy  Neurologic: A&O X 3;  No focal weakness or paresthesias are detected Psychiatric:  The pt has  Normal affect.   Non-Invasive Vascular Imaging:   Venous Reflux Times  +--------------+---------+------+-----------+------------+--------+  RIGHT        Reflux NoRefluxReflux TimeDiameter cmsComments                          Yes                                   +--------------+---------+------+-----------+------------+--------+  CFV          no                                              +--------------+---------+------+-----------+------------+--------+  FV mid        no                                              +--------------+---------+------+-----------+------------+--------+  Popliteal    no                                              +--------------+---------+------+-----------+------------+--------+  GSV at [facility]    no                            0.49              +--------------+---------+------+-----------+------------+--------+  GSV prox thighno                            0.19              +--------------+---------+------+-----------+------------+--------+  GSV mid thigh no                            0.23              +--------------+---------+------+-----------+------------+--------+  GSV dist thighno                            0.20              +--------------+---------+------+-----------+------------+--------+  GSV at knee   no                            0.20              +--------------+---------+------+-----------+------------+--------+  SSV Pop Fossa no                            0.20              +--------------+---------+------+-----------+------------+--------+  SSV prox calf no                            0.27              +--------------+---------+------+-----------+------------+--------+      ASSESSMENT/PLAN:: 87 y.o. male presenting with bilateral lower extremity edema consistent with lymphedema.  Venous insufficiency ultrasound demonstrated no venous reflux.  Jamar I had a long  discussion regarding treatment of his lymphedema.  It is interesting that it is affecting him at this stage in life, especially as it was not precipitated by trauma.  There could be an element of heart failure associated, as well as HD. I did not appreciate any medications that could cause this.  Regardless, he would benefit from bilateral lower extremity compression stockings.  He was sized, and given a pair today.  He would also benefit from echo to ensure that his heart is functioning appropriately.   My plan is to see him at the 54-month mark to assess his symptoms.  Should the symptoms improved with compression, my plan would be to continue compression stockings lifelong.  If this continues to be an issue, I would recommend bilateral lower extremity lymphedema pumps.  Omega was asked, office should any questions or concerns arise, and stay as active as possible in the meantime.  When not ambulating, he is instructed to elevate, and wear compressions daily, taking them off at night.   Victorino Sparrow, MD [facility] [phone] Total time of patient care including pre-visit research, consultation, and documentation greater than 30 minutes

## 2023-07-03 ENCOUNTER — Ambulatory Visit (HOSPITAL_COMMUNITY)
Admission: RE | Admit: 2023-07-03 | Discharge: 2023-07-03 | Disposition: A | Discharge status: Home / Self Care | Payer: Medicare Other | Source: Ambulatory Visit | Attending: Vascular Surgery

## 2023-07-03 ENCOUNTER — Encounter: Payer: Self-pay | Admitting: Vascular Surgery

## 2023-07-03 ENCOUNTER — Ambulatory Visit: Payer: Medicare Other | Admitting: Vascular Surgery

## 2023-07-03 VITALS — BP 162/65 | HR 67 | Temp 98.4°F | Resp 18 | Ht 65.0 in | Wt 169.4 lb

## 2023-07-03 DIAGNOSIS — R609 Edema, unspecified: Secondary | ICD-10-CM | POA: Insufficient documentation

## 2023-07-03 DIAGNOSIS — M7989 Other specified soft tissue disorders: Secondary | ICD-10-CM

## 2023-07-03 DIAGNOSIS — N186 End stage renal disease: Secondary | ICD-10-CM | POA: Diagnosis not present

## 2023-07-03 DIAGNOSIS — I890 Lymphedema, not elsewhere classified: Secondary | ICD-10-CM | POA: Diagnosis not present

## 2023-07-04 DIAGNOSIS — Z992 Dependence on renal dialysis: Secondary | ICD-10-CM | POA: Diagnosis not present

## 2023-07-04 DIAGNOSIS — N186 End stage renal disease: Secondary | ICD-10-CM | POA: Diagnosis not present

## 2023-07-07 DIAGNOSIS — E119 Type 2 diabetes mellitus without complications: Secondary | ICD-10-CM | POA: Diagnosis not present

## 2023-07-07 DIAGNOSIS — Z992 Dependence on renal dialysis: Secondary | ICD-10-CM | POA: Diagnosis not present

## 2023-07-07 DIAGNOSIS — Z794 Long term (current) use of insulin: Secondary | ICD-10-CM | POA: Diagnosis not present

## 2023-07-07 DIAGNOSIS — N186 End stage renal disease: Secondary | ICD-10-CM | POA: Diagnosis not present

## 2023-07-09 DIAGNOSIS — N186 End stage renal disease: Secondary | ICD-10-CM | POA: Diagnosis not present

## 2023-07-09 DIAGNOSIS — Z992 Dependence on renal dialysis: Secondary | ICD-10-CM | POA: Diagnosis not present

## 2023-07-11 DIAGNOSIS — Z992 Dependence on renal dialysis: Secondary | ICD-10-CM | POA: Diagnosis not present

## 2023-07-11 DIAGNOSIS — N186 End stage renal disease: Secondary | ICD-10-CM | POA: Diagnosis not present

## 2023-07-13 DIAGNOSIS — N186 End stage renal disease: Secondary | ICD-10-CM | POA: Diagnosis not present

## 2023-07-13 DIAGNOSIS — Z992 Dependence on renal dialysis: Secondary | ICD-10-CM | POA: Diagnosis not present

## 2023-07-14 DIAGNOSIS — N186 End stage renal disease: Secondary | ICD-10-CM | POA: Diagnosis not present

## 2023-07-14 DIAGNOSIS — Z992 Dependence on renal dialysis: Secondary | ICD-10-CM | POA: Diagnosis not present

## 2023-07-16 DIAGNOSIS — Z992 Dependence on renal dialysis: Secondary | ICD-10-CM | POA: Diagnosis not present

## 2023-07-16 DIAGNOSIS — N186 End stage renal disease: Secondary | ICD-10-CM | POA: Diagnosis not present

## 2023-07-18 DIAGNOSIS — Z992 Dependence on renal dialysis: Secondary | ICD-10-CM | POA: Diagnosis not present

## 2023-07-18 DIAGNOSIS — N186 End stage renal disease: Secondary | ICD-10-CM | POA: Diagnosis not present

## 2023-07-21 DIAGNOSIS — Z992 Dependence on renal dialysis: Secondary | ICD-10-CM | POA: Diagnosis not present

## 2023-07-21 DIAGNOSIS — N186 End stage renal disease: Secondary | ICD-10-CM | POA: Diagnosis not present

## 2023-07-23 DIAGNOSIS — Z992 Dependence on renal dialysis: Secondary | ICD-10-CM | POA: Diagnosis not present

## 2023-07-23 DIAGNOSIS — N186 End stage renal disease: Secondary | ICD-10-CM | POA: Diagnosis not present

## 2023-07-25 DIAGNOSIS — N186 End stage renal disease: Secondary | ICD-10-CM | POA: Diagnosis not present

## 2023-07-25 DIAGNOSIS — Z992 Dependence on renal dialysis: Secondary | ICD-10-CM | POA: Diagnosis not present

## 2023-07-28 DIAGNOSIS — Z992 Dependence on renal dialysis: Secondary | ICD-10-CM | POA: Diagnosis not present

## 2023-07-28 DIAGNOSIS — N186 End stage renal disease: Secondary | ICD-10-CM | POA: Diagnosis not present

## 2023-07-30 DIAGNOSIS — Z992 Dependence on renal dialysis: Secondary | ICD-10-CM | POA: Diagnosis not present

## 2023-07-30 DIAGNOSIS — N186 End stage renal disease: Secondary | ICD-10-CM | POA: Diagnosis not present

## 2023-08-01 DIAGNOSIS — Z992 Dependence on renal dialysis: Secondary | ICD-10-CM | POA: Diagnosis not present

## 2023-08-01 DIAGNOSIS — N186 End stage renal disease: Secondary | ICD-10-CM | POA: Diagnosis not present

## 2023-08-04 DIAGNOSIS — N186 End stage renal disease: Secondary | ICD-10-CM | POA: Diagnosis not present

## 2023-08-04 DIAGNOSIS — Z992 Dependence on renal dialysis: Secondary | ICD-10-CM | POA: Diagnosis not present

## 2023-08-06 DIAGNOSIS — Z992 Dependence on renal dialysis: Secondary | ICD-10-CM | POA: Diagnosis not present

## 2023-08-06 DIAGNOSIS — N186 End stage renal disease: Secondary | ICD-10-CM | POA: Diagnosis not present

## 2023-08-08 DIAGNOSIS — Z992 Dependence on renal dialysis: Secondary | ICD-10-CM | POA: Diagnosis not present

## 2023-08-08 DIAGNOSIS — N186 End stage renal disease: Secondary | ICD-10-CM | POA: Diagnosis not present

## 2023-08-10 DIAGNOSIS — H2512 Age-related nuclear cataract, left eye: Secondary | ICD-10-CM | POA: Diagnosis not present

## 2023-08-10 DIAGNOSIS — Z7984 Long term (current) use of oral hypoglycemic drugs: Secondary | ICD-10-CM | POA: Diagnosis not present

## 2023-08-10 DIAGNOSIS — Z961 Presence of intraocular lens: Secondary | ICD-10-CM | POA: Diagnosis not present

## 2023-08-10 DIAGNOSIS — E119 Type 2 diabetes mellitus without complications: Secondary | ICD-10-CM | POA: Diagnosis not present

## 2023-08-10 DIAGNOSIS — H40013 Open angle with borderline findings, low risk, bilateral: Secondary | ICD-10-CM | POA: Diagnosis not present

## 2023-08-11 DIAGNOSIS — N186 End stage renal disease: Secondary | ICD-10-CM | POA: Diagnosis not present

## 2023-08-11 DIAGNOSIS — Z992 Dependence on renal dialysis: Secondary | ICD-10-CM | POA: Diagnosis not present

## 2023-08-13 DIAGNOSIS — N186 End stage renal disease: Secondary | ICD-10-CM | POA: Diagnosis not present

## 2023-08-13 DIAGNOSIS — Z992 Dependence on renal dialysis: Secondary | ICD-10-CM | POA: Diagnosis not present

## 2023-08-15 DIAGNOSIS — N186 End stage renal disease: Secondary | ICD-10-CM | POA: Diagnosis not present

## 2023-08-15 DIAGNOSIS — Z992 Dependence on renal dialysis: Secondary | ICD-10-CM | POA: Diagnosis not present

## 2023-08-15 DIAGNOSIS — N2581 Secondary hyperparathyroidism of renal origin: Secondary | ICD-10-CM | POA: Diagnosis not present

## 2023-08-18 DIAGNOSIS — Z992 Dependence on renal dialysis: Secondary | ICD-10-CM | POA: Diagnosis not present

## 2023-08-18 DIAGNOSIS — N186 End stage renal disease: Secondary | ICD-10-CM | POA: Diagnosis not present

## 2023-08-18 DIAGNOSIS — N2581 Secondary hyperparathyroidism of renal origin: Secondary | ICD-10-CM | POA: Diagnosis not present

## 2023-08-20 DIAGNOSIS — Z992 Dependence on renal dialysis: Secondary | ICD-10-CM | POA: Diagnosis not present

## 2023-08-20 DIAGNOSIS — N186 End stage renal disease: Secondary | ICD-10-CM | POA: Diagnosis not present

## 2023-08-20 DIAGNOSIS — N2581 Secondary hyperparathyroidism of renal origin: Secondary | ICD-10-CM | POA: Diagnosis not present

## 2023-08-22 DIAGNOSIS — Z992 Dependence on renal dialysis: Secondary | ICD-10-CM | POA: Diagnosis not present

## 2023-08-22 DIAGNOSIS — N186 End stage renal disease: Secondary | ICD-10-CM | POA: Diagnosis not present

## 2023-08-22 DIAGNOSIS — N2581 Secondary hyperparathyroidism of renal origin: Secondary | ICD-10-CM | POA: Diagnosis not present

## 2023-08-25 DIAGNOSIS — Z992 Dependence on renal dialysis: Secondary | ICD-10-CM | POA: Diagnosis not present

## 2023-08-25 DIAGNOSIS — N2581 Secondary hyperparathyroidism of renal origin: Secondary | ICD-10-CM | POA: Diagnosis not present

## 2023-08-25 DIAGNOSIS — N186 End stage renal disease: Secondary | ICD-10-CM | POA: Diagnosis not present

## 2023-08-27 DIAGNOSIS — Z992 Dependence on renal dialysis: Secondary | ICD-10-CM | POA: Diagnosis not present

## 2023-08-27 DIAGNOSIS — N2581 Secondary hyperparathyroidism of renal origin: Secondary | ICD-10-CM | POA: Diagnosis not present

## 2023-08-27 DIAGNOSIS — N186 End stage renal disease: Secondary | ICD-10-CM | POA: Diagnosis not present

## 2023-08-29 DIAGNOSIS — Z992 Dependence on renal dialysis: Secondary | ICD-10-CM | POA: Diagnosis not present

## 2023-08-29 DIAGNOSIS — N186 End stage renal disease: Secondary | ICD-10-CM | POA: Diagnosis not present

## 2023-08-29 DIAGNOSIS — N2581 Secondary hyperparathyroidism of renal origin: Secondary | ICD-10-CM | POA: Diagnosis not present

## 2023-09-01 DIAGNOSIS — N2581 Secondary hyperparathyroidism of renal origin: Secondary | ICD-10-CM | POA: Diagnosis not present

## 2023-09-01 DIAGNOSIS — N186 End stage renal disease: Secondary | ICD-10-CM | POA: Diagnosis not present

## 2023-09-01 DIAGNOSIS — Z992 Dependence on renal dialysis: Secondary | ICD-10-CM | POA: Diagnosis not present

## 2023-09-03 DIAGNOSIS — N186 End stage renal disease: Secondary | ICD-10-CM | POA: Diagnosis not present

## 2023-09-03 DIAGNOSIS — N2581 Secondary hyperparathyroidism of renal origin: Secondary | ICD-10-CM | POA: Diagnosis not present

## 2023-09-03 DIAGNOSIS — Z992 Dependence on renal dialysis: Secondary | ICD-10-CM | POA: Diagnosis not present

## 2023-09-05 DIAGNOSIS — Z992 Dependence on renal dialysis: Secondary | ICD-10-CM | POA: Diagnosis not present

## 2023-09-05 DIAGNOSIS — N2581 Secondary hyperparathyroidism of renal origin: Secondary | ICD-10-CM | POA: Diagnosis not present

## 2023-09-05 DIAGNOSIS — N186 End stage renal disease: Secondary | ICD-10-CM | POA: Diagnosis not present

## 2023-09-08 DIAGNOSIS — Z992 Dependence on renal dialysis: Secondary | ICD-10-CM | POA: Diagnosis not present

## 2023-09-08 DIAGNOSIS — N186 End stage renal disease: Secondary | ICD-10-CM | POA: Diagnosis not present

## 2023-09-08 DIAGNOSIS — N2581 Secondary hyperparathyroidism of renal origin: Secondary | ICD-10-CM | POA: Diagnosis not present

## 2023-09-10 DIAGNOSIS — N186 End stage renal disease: Secondary | ICD-10-CM | POA: Diagnosis not present

## 2023-09-10 DIAGNOSIS — N2581 Secondary hyperparathyroidism of renal origin: Secondary | ICD-10-CM | POA: Diagnosis not present

## 2023-09-10 DIAGNOSIS — Z992 Dependence on renal dialysis: Secondary | ICD-10-CM | POA: Diagnosis not present

## 2023-09-12 DIAGNOSIS — N186 End stage renal disease: Secondary | ICD-10-CM | POA: Diagnosis not present

## 2023-09-12 DIAGNOSIS — N2581 Secondary hyperparathyroidism of renal origin: Secondary | ICD-10-CM | POA: Diagnosis not present

## 2023-09-12 DIAGNOSIS — Z992 Dependence on renal dialysis: Secondary | ICD-10-CM | POA: Diagnosis not present

## 2023-09-15 DIAGNOSIS — Z992 Dependence on renal dialysis: Secondary | ICD-10-CM | POA: Diagnosis not present

## 2023-09-15 DIAGNOSIS — N186 End stage renal disease: Secondary | ICD-10-CM | POA: Diagnosis not present

## 2023-09-15 DIAGNOSIS — N2581 Secondary hyperparathyroidism of renal origin: Secondary | ICD-10-CM | POA: Diagnosis not present

## 2023-09-17 DIAGNOSIS — Z992 Dependence on renal dialysis: Secondary | ICD-10-CM | POA: Diagnosis not present

## 2023-09-17 DIAGNOSIS — N2581 Secondary hyperparathyroidism of renal origin: Secondary | ICD-10-CM | POA: Diagnosis not present

## 2023-09-17 DIAGNOSIS — N186 End stage renal disease: Secondary | ICD-10-CM | POA: Diagnosis not present

## 2023-09-19 DIAGNOSIS — Z992 Dependence on renal dialysis: Secondary | ICD-10-CM | POA: Diagnosis not present

## 2023-09-19 DIAGNOSIS — N186 End stage renal disease: Secondary | ICD-10-CM | POA: Diagnosis not present

## 2023-09-19 DIAGNOSIS — N2581 Secondary hyperparathyroidism of renal origin: Secondary | ICD-10-CM | POA: Diagnosis not present

## 2023-09-21 ENCOUNTER — Ambulatory Visit: Payer: Medicare Other

## 2023-09-21 DIAGNOSIS — Z992 Dependence on renal dialysis: Secondary | ICD-10-CM | POA: Diagnosis not present

## 2023-09-21 DIAGNOSIS — M1990 Unspecified osteoarthritis, unspecified site: Secondary | ICD-10-CM | POA: Diagnosis not present

## 2023-09-21 DIAGNOSIS — G4700 Insomnia, unspecified: Secondary | ICD-10-CM | POA: Diagnosis not present

## 2023-09-21 DIAGNOSIS — E785 Hyperlipidemia, unspecified: Secondary | ICD-10-CM | POA: Diagnosis not present

## 2023-09-21 DIAGNOSIS — I10 Essential (primary) hypertension: Secondary | ICD-10-CM | POA: Diagnosis not present

## 2023-09-21 DIAGNOSIS — E1165 Type 2 diabetes mellitus with hyperglycemia: Secondary | ICD-10-CM | POA: Diagnosis not present

## 2023-09-22 DIAGNOSIS — N186 End stage renal disease: Secondary | ICD-10-CM | POA: Diagnosis not present

## 2023-09-22 DIAGNOSIS — N2581 Secondary hyperparathyroidism of renal origin: Secondary | ICD-10-CM | POA: Diagnosis not present

## 2023-09-22 DIAGNOSIS — Z992 Dependence on renal dialysis: Secondary | ICD-10-CM | POA: Diagnosis not present

## 2023-09-24 ENCOUNTER — Ambulatory Visit: Payer: Medicare Other

## 2023-09-24 DIAGNOSIS — N2581 Secondary hyperparathyroidism of renal origin: Secondary | ICD-10-CM | POA: Diagnosis not present

## 2023-09-24 DIAGNOSIS — N186 End stage renal disease: Secondary | ICD-10-CM | POA: Diagnosis not present

## 2023-09-24 DIAGNOSIS — Z992 Dependence on renal dialysis: Secondary | ICD-10-CM | POA: Diagnosis not present

## 2023-09-25 ENCOUNTER — Ambulatory Visit (INDEPENDENT_AMBULATORY_CARE_PROVIDER_SITE_OTHER): Payer: Medicare Other | Admitting: Physician Assistant

## 2023-09-25 VITALS — BP 133/67 | HR 57 | Temp 98.2°F | Resp 16 | Ht 65.0 in | Wt 169.3 lb

## 2023-09-25 DIAGNOSIS — R609 Edema, unspecified: Secondary | ICD-10-CM | POA: Diagnosis not present

## 2023-09-25 DIAGNOSIS — I890 Lymphedema, not elsewhere classified: Secondary | ICD-10-CM | POA: Diagnosis not present

## 2023-09-25 NOTE — Progress Notes (Signed)
Office Note     CC:  follow up Requesting Provider:  Benetta Spar*  HPI: [name] is a 87 y.o. (1936/09/12) male who presents for reevaluation of lymphedema right lower extremity.  He was seen in the office by Dr. Karin Lieu in September of this year.  Venous reflux duplex of the right leg was negative for any correctable venous reflux.  He was measured for and prescribed knee-high compression to wear on a daily basis.  He was also asked to elevate his legs, avoid prolonged sitting and standing, and increase mobility.  Since office visit he believes the edema is under better control.  He continues to be without wounds or severe symptoms related to edema.  Past medical history also significant for ESRD on dialysis and CHF.  He denies tobacco use.   Past Medical History:  Diagnosis Date   Anemia    Arthritis    ESRD (end stage renal disease) on dialysis [facility])    T/TH/Sat   Essential hypertension    GERD (gastroesophageal reflux disease)    Hyperlipidemia    Prostate cancer (HCC)    took radiation   Type 2 diabetes mellitus (HCC)     Past Surgical History:  Procedure Laterality Date   ANTERIOR CERVICAL DECOMP/DISCECTOMY FUSION N/A 08/23/2020   Procedure: Cervical 3-4 Anterior cervical decompression/discectomy/fusion;  Surgeon: Maeola Harman, MD;  Location: [facility] OR;  Service: Neurosurgery;  Laterality: N/A;  3C/RM 21 to follow   AV FISTULA PLACEMENT Left 02/21/2021   Procedure: INSERTION OF ARTERIOVENOUS GORE-TEX GRAFT LEFT ARM;  Surgeon: Larina Earthly, MD;  Location: [facility] ORS;  Service: Vascular;  Laterality: Left;   BIOPSY  10/22/2022   Procedure: BIOPSY;  Surgeon: Corbin Ade, MD;  [facility];  Service: Endoscopy;;   CATARACT EXTRACTION W/PHACO Right 04/28/2016   Procedure: CATARACT EXTRACTION PHACO AND INTRAOCULAR LENS PLACEMENT (IOC);  Surgeon: Susa Simmonds, MD;  Location: [facility] ORS;  Service: Ophthalmology;  Laterality: Right;  CDE: 4.28   COLONOSCOPY      about 2 years in [place] around 2016   CYSTECTOMY     pt. denies   ESOPHAGOGASTRODUODENOSCOPY (EGD) WITH PROPOFOL N/A 10/22/2022   Procedure: ESOPHAGOGASTRODUODENOSCOPY (EGD) WITH PROPOFOL;  Surgeon: Corbin Ade, MD;  [facility];  Service: Endoscopy;  Laterality: N/A;  12:30 pm, pt know to arrive at 9:15, LM to see if pt can come earlier   IR FLUORO GUIDE CV LINE LEFT  12/20/2020   IR US GUIDE VASC ACCESS LEFT  12/20/2020   MALONEY DILATION N/A 10/22/2022   Procedure: Elease Hashimoto DILATION;  Surgeon: Corbin Ade, MD;  [facility];  Service: Endoscopy;  Laterality: N/A;    Social History   Socioeconomic History   Marital status: Divorced    Spouse name: Not on file   Number of children: Not on file   Years of education: Not on file   Highest education level: Not on file  Occupational History   Occupation: Retired  Tobacco Use   Smoking status: Former    Current packs/day: 0.00    Average packs/day: 2.0 packs/day for 32.9 years (65.9 ttl pk-yrs)    Types: Pipe, Cigars, Cigarettes    Start date: 11/08/1954    Quit date: 10/14/1987    Years since quitting: 35.9   Smokeless tobacco: Never   Tobacco comments:    About 2 pipes a day  Vaping Use   Vaping status: Never Used  Substance and Sexual Activity  Alcohol use: No    Alcohol/week: 0.0 standard drinks of alcohol   Drug use: No   Sexual activity: Not Currently  Other Topics Concern   Not on file  Social History Narrative   Not on file   Social Drivers of Health   Financial Resource Strain: Not on file  Food Insecurity: Not on file  Transportation Needs: Not on file  Physical Activity: Not on file  Stress: Not on file  Social Connections: Unknown (02/24/2022)   Received from [facility], [facility]   Social Network    Social Network: Not on file  Intimate Partner Violence: Unknown (01/16/2022)   Received from [facility], [facility]   HITS    Physically Hurt: Not on file    Insult  or Talk Down To: Not on file    Threaten Physical Harm: Not on file    Scream or Curse: Not on file    Family History  Problem Relation Age of Onset   Heart disease Mother        cause of death   Diabetes Sister    Colon cancer Neg Hx     Current Outpatient Medications  Medication Sig Dispense Refill   Accu-Chek Softclix Lancets lancets      aspirin 81 MG tablet Take 81 mg by mouth daily.     gabapentin (NEURONTIN) 100 MG capsule Take 100 mg by mouth daily.     glucose blood (ACCU-CHEK AVIVA PLUS) test strip      hydrALAZINE (APRESOLINE) 25 MG tablet Take 25 mg by mouth 3 (three) times daily.     Lancets Misc. (ACCU-CHEK SOFTCLIX LANCET DEV) KIT      lidocaine-prilocaine (EMLA) cream SMARTSIG:1 Topical     linagliptin (TRADJENTA) 5 MG TABS tablet Take 5 mg by mouth daily.     loratadine (CLARITIN) 10 MG tablet Take 10 mg by mouth daily.     metoprolol tartrate (LOPRESSOR) 25 MG tablet Take 1 tablet (25 mg total) by mouth 2 (two) times daily.     Multiple Vitamin (MULTIVITAMIN WITH MINERALS) TABS tablet Take 1 tablet by mouth daily.     NIFEdipine (PROCARDIA XL/ADALAT-CC) 90 MG 24 hr tablet TAKE (1) TABLET BY MOUTH ONCE DAILY. (Patient taking differently: Take 90 mg by mouth daily before breakfast.) 30 tablet 6   pantoprazole (PROTONIX) 40 MG tablet Take 1 tablet (40 mg total) by mouth daily before breakfast. 30 tablet 11   phosphorus (K PHOS NEUTRAL) 155-852-130 MG tablet Take by mouth 2 (two) times daily.     tamsulosin (FLOMAX) 0.4 MG CAPS capsule Take by mouth daily.     zolpidem (AMBIEN) 5 MG tablet Take 5 mg by mouth at bedtime as needed for sleep.     No current facility-administered medications for this visit.    No Known Allergies   REVIEW OF SYSTEMS:   [X]  denotes positive finding, [ ]  denotes negative finding Cardiac  Comments:  Chest pain or chest pressure:    Shortness of breath upon exertion:    Short of breath when lying flat:    Irregular heart rhythm:         Vascular    Pain in calf, thigh, or hip brought on by ambulation:    Pain in feet at night that wakes you up from your sleep:     Blood clot in your veins:    Leg swelling:         Pulmonary    Oxygen at home:  Productive cough:     Wheezing:         Neurologic    Sudden weakness in arms or legs:     Sudden numbness in arms or legs:     Sudden onset of difficulty speaking or slurred speech:    Temporary loss of vision in one eye:     Problems with dizziness:         Gastrointestinal    Blood in stool:     Vomited blood:         Genitourinary    Burning when urinating:     Blood in urine:        Psychiatric    Major depression:         Hematologic    Bleeding problems:    Problems with blood clotting too easily:        Skin    Rashes or ulcers:        Constitutional    Fever or chills:      PHYSICAL EXAMINATION:  Vitals:   09/25/23 1035  BP: 133/67  Pulse: (!) 57  Resp: 16  Temp: 98.2 F (36.8 C)  TempSrc: Temporal  SpO2: 93%  Weight: 169 lb 4.8 oz (76.8 kg)  Height: 5\' 5"  (1.651 m)    General:  WDWN in NAD; vital signs documented above Gait: Not observed HENT: WNL, normocephalic Pulmonary: normal non-labored breathing , without Rales, rhonchi,  wheezing Cardiac: regular HR Abdomen: soft, NT, no masses Skin: without rashes Extremities: Edema of the right leg to the level of the shin; no left leg edema appreciated on exam; no venous ulcerations, mild pigmentation changes to both lower legs Musculoskeletal: no muscle wasting or atrophy  Neurologic: A&O X 3 Psychiatric:  The pt has Normal affect.   Non-Invasive Vascular Imaging:   Right lower extremity venous reflux study negative for any significant deep or superficial venous reflux    ASSESSMENT/PLAN:: 87 y.o. male here for follow up for reevaluation of lymphedema of the right lower extremity  Since office visit with Dr. Karin Lieu in September he has been wearing knee-high compression socks  on a daily basis.  He continues to elevate his legs periodically throughout the day.  He is trying to avoid prolonged sitting and standing and is trying to be more active.  He believes the edema is better controlled now.  He was offered referral to [place]'s lymphedema clinic however given that his symptoms are only mild at this point, he would like to proceed conservatively.  In the future if he changes his mind he will notify our office.  For now he will follow-up on an as-needed basis.   Emilie Rutter, PA-C [facility] [phone]  Clinic MD:   Hetty [facility]end

## 2023-09-26 DIAGNOSIS — Z992 Dependence on renal dialysis: Secondary | ICD-10-CM | POA: Diagnosis not present

## 2023-09-26 DIAGNOSIS — N186 End stage renal disease: Secondary | ICD-10-CM | POA: Diagnosis not present

## 2023-09-26 DIAGNOSIS — N2581 Secondary hyperparathyroidism of renal origin: Secondary | ICD-10-CM | POA: Diagnosis not present

## 2023-09-28 DIAGNOSIS — H16223 Keratoconjunctivitis sicca, not specified as Sjogren's, bilateral: Secondary | ICD-10-CM | POA: Diagnosis not present

## 2023-09-29 DIAGNOSIS — N186 End stage renal disease: Secondary | ICD-10-CM | POA: Diagnosis not present

## 2023-09-29 DIAGNOSIS — Z992 Dependence on renal dialysis: Secondary | ICD-10-CM | POA: Diagnosis not present

## 2023-09-29 DIAGNOSIS — N2581 Secondary hyperparathyroidism of renal origin: Secondary | ICD-10-CM | POA: Diagnosis not present

## 2023-10-01 DIAGNOSIS — N2581 Secondary hyperparathyroidism of renal origin: Secondary | ICD-10-CM | POA: Diagnosis not present

## 2023-10-01 DIAGNOSIS — N186 End stage renal disease: Secondary | ICD-10-CM | POA: Diagnosis not present

## 2023-10-01 DIAGNOSIS — Z992 Dependence on renal dialysis: Secondary | ICD-10-CM | POA: Diagnosis not present

## 2023-10-03 DIAGNOSIS — N186 End stage renal disease: Secondary | ICD-10-CM | POA: Diagnosis not present

## 2023-10-03 DIAGNOSIS — N2581 Secondary hyperparathyroidism of renal origin: Secondary | ICD-10-CM | POA: Diagnosis not present

## 2023-10-03 DIAGNOSIS — Z992 Dependence on renal dialysis: Secondary | ICD-10-CM | POA: Diagnosis not present

## 2023-10-05 DIAGNOSIS — N2581 Secondary hyperparathyroidism of renal origin: Secondary | ICD-10-CM | POA: Diagnosis not present

## 2023-10-05 DIAGNOSIS — Z992 Dependence on renal dialysis: Secondary | ICD-10-CM | POA: Diagnosis not present

## 2023-10-05 DIAGNOSIS — N186 End stage renal disease: Secondary | ICD-10-CM | POA: Diagnosis not present

## 2023-10-08 DIAGNOSIS — Z992 Dependence on renal dialysis: Secondary | ICD-10-CM | POA: Diagnosis not present

## 2023-10-08 DIAGNOSIS — N2581 Secondary hyperparathyroidism of renal origin: Secondary | ICD-10-CM | POA: Diagnosis not present

## 2023-10-08 DIAGNOSIS — N186 End stage renal disease: Secondary | ICD-10-CM | POA: Diagnosis not present

## 2023-10-10 DIAGNOSIS — Z794 Long term (current) use of insulin: Secondary | ICD-10-CM | POA: Diagnosis not present

## 2023-10-10 DIAGNOSIS — E119 Type 2 diabetes mellitus without complications: Secondary | ICD-10-CM | POA: Diagnosis not present

## 2023-10-10 DIAGNOSIS — Z992 Dependence on renal dialysis: Secondary | ICD-10-CM | POA: Diagnosis not present

## 2023-10-10 DIAGNOSIS — N2581 Secondary hyperparathyroidism of renal origin: Secondary | ICD-10-CM | POA: Diagnosis not present

## 2023-10-10 DIAGNOSIS — N186 End stage renal disease: Secondary | ICD-10-CM | POA: Diagnosis not present

## 2023-10-13 DIAGNOSIS — N2581 Secondary hyperparathyroidism of renal origin: Secondary | ICD-10-CM | POA: Diagnosis not present

## 2023-10-13 DIAGNOSIS — N186 End stage renal disease: Secondary | ICD-10-CM | POA: Diagnosis not present

## 2023-10-13 DIAGNOSIS — Z992 Dependence on renal dialysis: Secondary | ICD-10-CM | POA: Diagnosis not present

## 2023-10-15 DIAGNOSIS — N186 End stage renal disease: Secondary | ICD-10-CM | POA: Diagnosis not present

## 2023-10-15 DIAGNOSIS — Z992 Dependence on renal dialysis: Secondary | ICD-10-CM | POA: Diagnosis not present

## 2023-10-15 DIAGNOSIS — N2581 Secondary hyperparathyroidism of renal origin: Secondary | ICD-10-CM | POA: Diagnosis not present

## 2023-10-17 DIAGNOSIS — Z992 Dependence on renal dialysis: Secondary | ICD-10-CM | POA: Diagnosis not present

## 2023-10-17 DIAGNOSIS — N2581 Secondary hyperparathyroidism of renal origin: Secondary | ICD-10-CM | POA: Diagnosis not present

## 2023-10-17 DIAGNOSIS — N186 End stage renal disease: Secondary | ICD-10-CM | POA: Diagnosis not present

## 2023-10-20 DIAGNOSIS — N2581 Secondary hyperparathyroidism of renal origin: Secondary | ICD-10-CM | POA: Diagnosis not present

## 2023-10-20 DIAGNOSIS — N186 End stage renal disease: Secondary | ICD-10-CM | POA: Diagnosis not present

## 2023-10-20 DIAGNOSIS — Z992 Dependence on renal dialysis: Secondary | ICD-10-CM | POA: Diagnosis not present

## 2023-10-22 DIAGNOSIS — N2581 Secondary hyperparathyroidism of renal origin: Secondary | ICD-10-CM | POA: Diagnosis not present

## 2023-10-22 DIAGNOSIS — N186 End stage renal disease: Secondary | ICD-10-CM | POA: Diagnosis not present

## 2023-10-22 DIAGNOSIS — Z992 Dependence on renal dialysis: Secondary | ICD-10-CM | POA: Diagnosis not present

## 2023-10-23 DIAGNOSIS — H2513 Age-related nuclear cataract, bilateral: Secondary | ICD-10-CM | POA: Diagnosis not present

## 2023-10-23 DIAGNOSIS — H40033 Anatomical narrow angle, bilateral: Secondary | ICD-10-CM | POA: Diagnosis not present

## 2023-10-26 DIAGNOSIS — N186 End stage renal disease: Secondary | ICD-10-CM | POA: Diagnosis not present

## 2023-10-26 DIAGNOSIS — Z992 Dependence on renal dialysis: Secondary | ICD-10-CM | POA: Diagnosis not present

## 2023-10-26 DIAGNOSIS — N2581 Secondary hyperparathyroidism of renal origin: Secondary | ICD-10-CM | POA: Diagnosis not present

## 2023-10-29 DIAGNOSIS — N186 End stage renal disease: Secondary | ICD-10-CM | POA: Diagnosis not present

## 2023-10-29 DIAGNOSIS — Z992 Dependence on renal dialysis: Secondary | ICD-10-CM | POA: Diagnosis not present

## 2023-10-29 DIAGNOSIS — N2581 Secondary hyperparathyroidism of renal origin: Secondary | ICD-10-CM | POA: Diagnosis not present

## 2023-10-31 DIAGNOSIS — N2581 Secondary hyperparathyroidism of renal origin: Secondary | ICD-10-CM | POA: Diagnosis not present

## 2023-10-31 DIAGNOSIS — Z992 Dependence on renal dialysis: Secondary | ICD-10-CM | POA: Diagnosis not present

## 2023-10-31 DIAGNOSIS — N186 End stage renal disease: Secondary | ICD-10-CM | POA: Diagnosis not present

## 2023-11-03 DIAGNOSIS — N186 End stage renal disease: Secondary | ICD-10-CM | POA: Diagnosis not present

## 2023-11-03 DIAGNOSIS — N2581 Secondary hyperparathyroidism of renal origin: Secondary | ICD-10-CM | POA: Diagnosis not present

## 2023-11-03 DIAGNOSIS — Z992 Dependence on renal dialysis: Secondary | ICD-10-CM | POA: Diagnosis not present

## 2023-11-05 DIAGNOSIS — N186 End stage renal disease: Secondary | ICD-10-CM | POA: Diagnosis not present

## 2023-11-05 DIAGNOSIS — Z992 Dependence on renal dialysis: Secondary | ICD-10-CM | POA: Diagnosis not present

## 2023-11-05 DIAGNOSIS — N2581 Secondary hyperparathyroidism of renal origin: Secondary | ICD-10-CM | POA: Diagnosis not present

## 2023-11-07 DIAGNOSIS — Z992 Dependence on renal dialysis: Secondary | ICD-10-CM | POA: Diagnosis not present

## 2023-11-07 DIAGNOSIS — N186 End stage renal disease: Secondary | ICD-10-CM | POA: Diagnosis not present

## 2023-11-07 DIAGNOSIS — N2581 Secondary hyperparathyroidism of renal origin: Secondary | ICD-10-CM | POA: Diagnosis not present

## 2023-11-10 DIAGNOSIS — Z992 Dependence on renal dialysis: Secondary | ICD-10-CM | POA: Diagnosis not present

## 2023-11-10 DIAGNOSIS — N2581 Secondary hyperparathyroidism of renal origin: Secondary | ICD-10-CM | POA: Diagnosis not present

## 2023-11-10 DIAGNOSIS — N186 End stage renal disease: Secondary | ICD-10-CM | POA: Diagnosis not present

## 2023-11-12 DIAGNOSIS — N186 End stage renal disease: Secondary | ICD-10-CM | POA: Diagnosis not present

## 2023-11-12 DIAGNOSIS — N2581 Secondary hyperparathyroidism of renal origin: Secondary | ICD-10-CM | POA: Diagnosis not present

## 2023-11-12 DIAGNOSIS — Z992 Dependence on renal dialysis: Secondary | ICD-10-CM | POA: Diagnosis not present

## 2023-11-13 DIAGNOSIS — N186 End stage renal disease: Secondary | ICD-10-CM | POA: Diagnosis not present

## 2023-11-13 DIAGNOSIS — Z992 Dependence on renal dialysis: Secondary | ICD-10-CM | POA: Diagnosis not present

## 2023-11-14 DIAGNOSIS — N186 End stage renal disease: Secondary | ICD-10-CM | POA: Diagnosis not present

## 2023-11-14 DIAGNOSIS — N2581 Secondary hyperparathyroidism of renal origin: Secondary | ICD-10-CM | POA: Diagnosis not present

## 2023-11-14 DIAGNOSIS — Z992 Dependence on renal dialysis: Secondary | ICD-10-CM | POA: Diagnosis not present

## 2023-11-17 DIAGNOSIS — N2581 Secondary hyperparathyroidism of renal origin: Secondary | ICD-10-CM | POA: Diagnosis not present

## 2023-11-17 DIAGNOSIS — N186 End stage renal disease: Secondary | ICD-10-CM | POA: Diagnosis not present

## 2023-11-17 DIAGNOSIS — Z992 Dependence on renal dialysis: Secondary | ICD-10-CM | POA: Diagnosis not present

## 2023-11-19 DIAGNOSIS — N2581 Secondary hyperparathyroidism of renal origin: Secondary | ICD-10-CM | POA: Diagnosis not present

## 2023-11-19 DIAGNOSIS — N186 End stage renal disease: Secondary | ICD-10-CM | POA: Diagnosis not present

## 2023-11-19 DIAGNOSIS — Z992 Dependence on renal dialysis: Secondary | ICD-10-CM | POA: Diagnosis not present

## 2023-11-21 DIAGNOSIS — N2581 Secondary hyperparathyroidism of renal origin: Secondary | ICD-10-CM | POA: Diagnosis not present

## 2023-11-21 DIAGNOSIS — N186 End stage renal disease: Secondary | ICD-10-CM | POA: Diagnosis not present

## 2023-11-21 DIAGNOSIS — Z992 Dependence on renal dialysis: Secondary | ICD-10-CM | POA: Diagnosis not present

## 2023-11-24 DIAGNOSIS — Z992 Dependence on renal dialysis: Secondary | ICD-10-CM | POA: Diagnosis not present

## 2023-11-24 DIAGNOSIS — N186 End stage renal disease: Secondary | ICD-10-CM | POA: Diagnosis not present

## 2023-11-24 DIAGNOSIS — N2581 Secondary hyperparathyroidism of renal origin: Secondary | ICD-10-CM | POA: Diagnosis not present

## 2023-11-26 DIAGNOSIS — Z992 Dependence on renal dialysis: Secondary | ICD-10-CM | POA: Diagnosis not present

## 2023-11-26 DIAGNOSIS — N2581 Secondary hyperparathyroidism of renal origin: Secondary | ICD-10-CM | POA: Diagnosis not present

## 2023-11-26 DIAGNOSIS — N186 End stage renal disease: Secondary | ICD-10-CM | POA: Diagnosis not present

## 2023-11-28 DIAGNOSIS — N186 End stage renal disease: Secondary | ICD-10-CM | POA: Diagnosis not present

## 2023-11-28 DIAGNOSIS — N2581 Secondary hyperparathyroidism of renal origin: Secondary | ICD-10-CM | POA: Diagnosis not present

## 2023-11-28 DIAGNOSIS — Z992 Dependence on renal dialysis: Secondary | ICD-10-CM | POA: Diagnosis not present

## 2023-12-01 DIAGNOSIS — Z992 Dependence on renal dialysis: Secondary | ICD-10-CM | POA: Diagnosis not present

## 2023-12-01 DIAGNOSIS — N186 End stage renal disease: Secondary | ICD-10-CM | POA: Diagnosis not present

## 2023-12-01 DIAGNOSIS — N2581 Secondary hyperparathyroidism of renal origin: Secondary | ICD-10-CM | POA: Diagnosis not present

## 2023-12-03 DIAGNOSIS — N186 End stage renal disease: Secondary | ICD-10-CM | POA: Diagnosis not present

## 2023-12-03 DIAGNOSIS — Z992 Dependence on renal dialysis: Secondary | ICD-10-CM | POA: Diagnosis not present

## 2023-12-03 DIAGNOSIS — N2581 Secondary hyperparathyroidism of renal origin: Secondary | ICD-10-CM | POA: Diagnosis not present

## 2023-12-05 DIAGNOSIS — N186 End stage renal disease: Secondary | ICD-10-CM | POA: Diagnosis not present

## 2023-12-05 DIAGNOSIS — N2581 Secondary hyperparathyroidism of renal origin: Secondary | ICD-10-CM | POA: Diagnosis not present

## 2023-12-05 DIAGNOSIS — Z992 Dependence on renal dialysis: Secondary | ICD-10-CM | POA: Diagnosis not present

## 2023-12-08 DIAGNOSIS — Z992 Dependence on renal dialysis: Secondary | ICD-10-CM | POA: Diagnosis not present

## 2023-12-08 DIAGNOSIS — N2581 Secondary hyperparathyroidism of renal origin: Secondary | ICD-10-CM | POA: Diagnosis not present

## 2023-12-08 DIAGNOSIS — N186 End stage renal disease: Secondary | ICD-10-CM | POA: Diagnosis not present

## 2023-12-10 DIAGNOSIS — N186 End stage renal disease: Secondary | ICD-10-CM | POA: Diagnosis not present

## 2023-12-10 DIAGNOSIS — Z992 Dependence on renal dialysis: Secondary | ICD-10-CM | POA: Diagnosis not present

## 2023-12-10 DIAGNOSIS — N2581 Secondary hyperparathyroidism of renal origin: Secondary | ICD-10-CM | POA: Diagnosis not present

## 2023-12-11 DIAGNOSIS — Z992 Dependence on renal dialysis: Secondary | ICD-10-CM | POA: Diagnosis not present

## 2023-12-11 DIAGNOSIS — N186 End stage renal disease: Secondary | ICD-10-CM | POA: Diagnosis not present

## 2023-12-12 DIAGNOSIS — N186 End stage renal disease: Secondary | ICD-10-CM | POA: Diagnosis not present

## 2023-12-12 DIAGNOSIS — Z992 Dependence on renal dialysis: Secondary | ICD-10-CM | POA: Diagnosis not present

## 2023-12-12 DIAGNOSIS — N2581 Secondary hyperparathyroidism of renal origin: Secondary | ICD-10-CM | POA: Diagnosis not present

## 2023-12-15 DIAGNOSIS — N2581 Secondary hyperparathyroidism of renal origin: Secondary | ICD-10-CM | POA: Diagnosis not present

## 2023-12-15 DIAGNOSIS — N186 End stage renal disease: Secondary | ICD-10-CM | POA: Diagnosis not present

## 2023-12-15 DIAGNOSIS — Z992 Dependence on renal dialysis: Secondary | ICD-10-CM | POA: Diagnosis not present

## 2023-12-17 DIAGNOSIS — N186 End stage renal disease: Secondary | ICD-10-CM | POA: Diagnosis not present

## 2023-12-17 DIAGNOSIS — Z992 Dependence on renal dialysis: Secondary | ICD-10-CM | POA: Diagnosis not present

## 2023-12-17 DIAGNOSIS — N2581 Secondary hyperparathyroidism of renal origin: Secondary | ICD-10-CM | POA: Diagnosis not present

## 2023-12-19 DIAGNOSIS — Z992 Dependence on renal dialysis: Secondary | ICD-10-CM | POA: Diagnosis not present

## 2023-12-19 DIAGNOSIS — N2581 Secondary hyperparathyroidism of renal origin: Secondary | ICD-10-CM | POA: Diagnosis not present

## 2023-12-19 DIAGNOSIS — N186 End stage renal disease: Secondary | ICD-10-CM | POA: Diagnosis not present

## 2023-12-21 DIAGNOSIS — E785 Hyperlipidemia, unspecified: Secondary | ICD-10-CM | POA: Diagnosis not present

## 2023-12-21 DIAGNOSIS — E1142 Type 2 diabetes mellitus with diabetic polyneuropathy: Secondary | ICD-10-CM | POA: Diagnosis not present

## 2023-12-21 DIAGNOSIS — Z1389 Encounter for screening for other disorder: Secondary | ICD-10-CM | POA: Diagnosis not present

## 2023-12-21 DIAGNOSIS — Z0001 Encounter for general adult medical examination with abnormal findings: Secondary | ICD-10-CM | POA: Diagnosis not present

## 2023-12-21 DIAGNOSIS — M1990 Unspecified osteoarthritis, unspecified site: Secondary | ICD-10-CM | POA: Diagnosis not present

## 2023-12-21 DIAGNOSIS — I10 Essential (primary) hypertension: Secondary | ICD-10-CM | POA: Diagnosis not present

## 2023-12-21 DIAGNOSIS — Z992 Dependence on renal dialysis: Secondary | ICD-10-CM | POA: Diagnosis not present

## 2023-12-21 DIAGNOSIS — K219 Gastro-esophageal reflux disease without esophagitis: Secondary | ICD-10-CM | POA: Diagnosis not present

## 2023-12-22 DIAGNOSIS — N2581 Secondary hyperparathyroidism of renal origin: Secondary | ICD-10-CM | POA: Diagnosis not present

## 2023-12-22 DIAGNOSIS — N186 End stage renal disease: Secondary | ICD-10-CM | POA: Diagnosis not present

## 2023-12-22 DIAGNOSIS — Z992 Dependence on renal dialysis: Secondary | ICD-10-CM | POA: Diagnosis not present

## 2023-12-24 DIAGNOSIS — N186 End stage renal disease: Secondary | ICD-10-CM | POA: Diagnosis not present

## 2023-12-24 DIAGNOSIS — N2581 Secondary hyperparathyroidism of renal origin: Secondary | ICD-10-CM | POA: Diagnosis not present

## 2023-12-24 DIAGNOSIS — Z992 Dependence on renal dialysis: Secondary | ICD-10-CM | POA: Diagnosis not present

## 2023-12-26 DIAGNOSIS — Z992 Dependence on renal dialysis: Secondary | ICD-10-CM | POA: Diagnosis not present

## 2023-12-26 DIAGNOSIS — Z13220 Encounter for screening for lipoid disorders: Secondary | ICD-10-CM | POA: Diagnosis not present

## 2023-12-26 DIAGNOSIS — N186 End stage renal disease: Secondary | ICD-10-CM | POA: Diagnosis not present

## 2023-12-26 DIAGNOSIS — N2581 Secondary hyperparathyroidism of renal origin: Secondary | ICD-10-CM | POA: Diagnosis not present

## 2023-12-28 DIAGNOSIS — Z01818 Encounter for other preprocedural examination: Secondary | ICD-10-CM | POA: Diagnosis not present

## 2023-12-28 DIAGNOSIS — H25812 Combined forms of age-related cataract, left eye: Secondary | ICD-10-CM | POA: Diagnosis not present

## 2023-12-28 DIAGNOSIS — H2512 Age-related nuclear cataract, left eye: Secondary | ICD-10-CM | POA: Diagnosis not present

## 2023-12-28 DIAGNOSIS — H33103 Unspecified retinoschisis, bilateral: Secondary | ICD-10-CM | POA: Diagnosis not present

## 2023-12-29 DIAGNOSIS — Z992 Dependence on renal dialysis: Secondary | ICD-10-CM | POA: Diagnosis not present

## 2023-12-29 DIAGNOSIS — N2581 Secondary hyperparathyroidism of renal origin: Secondary | ICD-10-CM | POA: Diagnosis not present

## 2023-12-29 DIAGNOSIS — N186 End stage renal disease: Secondary | ICD-10-CM | POA: Diagnosis not present

## 2023-12-31 DIAGNOSIS — N186 End stage renal disease: Secondary | ICD-10-CM | POA: Diagnosis not present

## 2023-12-31 DIAGNOSIS — Z992 Dependence on renal dialysis: Secondary | ICD-10-CM | POA: Diagnosis not present

## 2023-12-31 DIAGNOSIS — N2581 Secondary hyperparathyroidism of renal origin: Secondary | ICD-10-CM | POA: Diagnosis not present

## 2024-01-02 DIAGNOSIS — Z992 Dependence on renal dialysis: Secondary | ICD-10-CM | POA: Diagnosis not present

## 2024-01-02 DIAGNOSIS — N186 End stage renal disease: Secondary | ICD-10-CM | POA: Diagnosis not present

## 2024-01-02 DIAGNOSIS — N2581 Secondary hyperparathyroidism of renal origin: Secondary | ICD-10-CM | POA: Diagnosis not present

## 2024-01-04 ENCOUNTER — Encounter (HOSPITAL_COMMUNITY): Payer: Self-pay

## 2024-01-04 ENCOUNTER — Emergency Department (HOSPITAL_COMMUNITY)

## 2024-01-04 ENCOUNTER — Emergency Department (HOSPITAL_COMMUNITY)
Admission: EM | Admit: 2024-01-04 | Discharge: 2024-01-04 | Disposition: A | Discharge status: Home / Self Care | Attending: Emergency Medicine | Admitting: Emergency Medicine

## 2024-01-04 ENCOUNTER — Other Ambulatory Visit: Payer: Self-pay

## 2024-01-04 DIAGNOSIS — K449 Diaphragmatic hernia without obstruction or gangrene: Secondary | ICD-10-CM | POA: Diagnosis not present

## 2024-01-04 DIAGNOSIS — K8020 Calculus of gallbladder without cholecystitis without obstruction: Secondary | ICD-10-CM | POA: Diagnosis not present

## 2024-01-04 DIAGNOSIS — R7989 Other specified abnormal findings of blood chemistry: Secondary | ICD-10-CM | POA: Diagnosis not present

## 2024-01-04 DIAGNOSIS — E1122 Type 2 diabetes mellitus with diabetic chronic kidney disease: Secondary | ICD-10-CM | POA: Insufficient documentation

## 2024-01-04 DIAGNOSIS — Z992 Dependence on renal dialysis: Secondary | ICD-10-CM | POA: Diagnosis not present

## 2024-01-04 DIAGNOSIS — I120 Hypertensive chronic kidney disease with stage 5 chronic kidney disease or end stage renal disease: Secondary | ICD-10-CM | POA: Insufficient documentation

## 2024-01-04 DIAGNOSIS — N186 End stage renal disease: Secondary | ICD-10-CM | POA: Insufficient documentation

## 2024-01-04 DIAGNOSIS — N3090 Cystitis, unspecified without hematuria: Secondary | ICD-10-CM | POA: Diagnosis not present

## 2024-01-04 DIAGNOSIS — N281 Cyst of kidney, acquired: Secondary | ICD-10-CM | POA: Diagnosis not present

## 2024-01-04 DIAGNOSIS — R109 Unspecified abdominal pain: Secondary | ICD-10-CM | POA: Diagnosis not present

## 2024-01-04 LAB — CBC WITH DIFFERENTIAL/PLATELET
Abs Immature Granulocytes: 0.03 10*3/uL (ref 0.00–0.07)
Basophils Absolute: 0 10*3/uL (ref 0.0–0.1)
Basophils Relative: 1 %
Eosinophils Absolute: 0.1 10*3/uL (ref 0.0–0.5)
Eosinophils Relative: 1 %
HCT: 28 % — ABNORMAL LOW (ref 39.0–52.0)
Hemoglobin: 8.8 g/dL — ABNORMAL LOW (ref 13.0–17.0)
Immature Granulocytes: 1 %
Lymphocytes Relative: 10 %
Lymphs Abs: 0.6 10*3/uL — ABNORMAL LOW (ref 0.7–4.0)
MCH: 26.4 pg (ref 26.0–34.0)
MCHC: 31.4 g/dL (ref 30.0–36.0)
MCV: 84.1 fL (ref 80.0–100.0)
Monocytes Absolute: 0.7 10*3/uL (ref 0.1–1.0)
Monocytes Relative: 12 %
Neutro Abs: 4.3 10*3/uL (ref 1.7–7.7)
Neutrophils Relative %: 75 %
Platelets: 179 10*3/uL (ref 150–400)
RBC: 3.33 MIL/uL — ABNORMAL LOW (ref 4.22–5.81)
RDW: 15.9 % — ABNORMAL HIGH (ref 11.5–15.5)
WBC: 5.7 10*3/uL (ref 4.0–10.5)
nRBC: 0 % (ref 0.0–0.2)

## 2024-01-04 LAB — URINALYSIS, ROUTINE W REFLEX MICROSCOPIC
Bilirubin Urine: NEGATIVE
Glucose, UA: NEGATIVE mg/dL
Hgb urine dipstick: NEGATIVE
Ketones, ur: NEGATIVE mg/dL
Nitrite: NEGATIVE
Protein, ur: 100 mg/dL — AB
Specific Gravity, Urine: 1.009 (ref 1.005–1.030)
WBC, UA: >50 WBC/hpf (ref 0–5)
pH: 8 (ref 5.0–8.0)

## 2024-01-04 LAB — COMPREHENSIVE METABOLIC PANEL
ALT: 10 U/L (ref 0–44)
AST: 19 U/L (ref 15–41)
Albumin: 3.3 g/dL — ABNORMAL LOW (ref 3.5–5.0)
Alkaline Phosphatase: 57 U/L (ref 38–126)
Anion gap: 16 — ABNORMAL HIGH (ref 5–15)
BUN: 23 mg/dL (ref 8–23)
CO2: 21 mmol/L — ABNORMAL LOW (ref 22–32)
Calcium: 8.5 mg/dL — ABNORMAL LOW (ref 8.9–10.3)
Chloride: 101 mmol/L (ref 98–111)
Creatinine, Ser: 7.34 mg/dL — ABNORMAL HIGH (ref 0.61–1.24)
GFR, Estimated: 7 mL/min — ABNORMAL LOW (ref >60)
Glucose, Bld: 124 mg/dL — ABNORMAL HIGH (ref 70–99)
Potassium: 3.8 mmol/L (ref 3.5–5.1)
Sodium: 138 mmol/L (ref 135–145)
Total Bilirubin: 0.7 mg/dL (ref 0.0–1.2)
Total Protein: 7.4 g/dL (ref 6.5–8.1)

## 2024-01-04 MED ORDER — SODIUM CHLORIDE 0.9 % IV SOLN
1.0000 g | Freq: Once | INTRAVENOUS | Status: AC
  Administered 2024-01-04: 1 g via INTRAVENOUS
  Filled 2024-01-04: qty 10

## 2024-01-04 MED ORDER — CEPHALEXIN 500 MG PO CAPS: 500.0000 mg | ORAL_CAPSULE | Freq: Every day | ORAL | 0 refills | Status: DC

## 2024-01-04 MED ORDER — CEPHALEXIN 250 MG PO CAPS: 250.0000 mg | ORAL_CAPSULE | Freq: Every day | ORAL | 0 refills | Status: DC

## 2024-01-04 NOTE — Discharge Instructions (Addendum)
 Take the antibiotic that I prescribed, take 1 tablet once daily for the entire course, on dialysis days make sure that you take it after dialysis.  Please follow-up with your primary care doctor.

## 2024-01-04 NOTE — ED Notes (Signed)
 Patient transported to CT

## 2024-01-04 NOTE — ED Triage Notes (Signed)
 Pt arrived via POV from home c/o right flank pain and dysuria. Pt reports symptoms have been worsening over a week. Pt denies hematuria.

## 2024-01-04 NOTE — ED Notes (Signed)
 Pt returned from CT

## 2024-01-04 NOTE — ED Provider Notes (Signed)
 [place] EMERGENCY DEPARTMENT AT [facility] Provider Note   CSN: [id number] Arrival date & time: 01/04/24  1438     History  Chief Complaint  Patient presents with   Flank Pain    Steve [name] is a 88 y.o. male Pt complains of right flank pain, pain with urination, frequent urination.  No hematuria.  No previous history of kidney stones. ESRD on dialysis at baseline.  Symptoms worsening over the last week.  Any fever, chills, nausea, vomiting.   Flank Pain       Home Medications Prior to Admission medications   Medication Sig Start Date End Date Taking? Authorizing Provider  Accu-Chek Softclix Lancets lancets  10/27/19   [provider]  aspirin 81 MG tablet Take 81 mg by mouth daily.    [provider]  cephALEXin (KEFLEX) 500 MG capsule Take 1 capsule (500 mg total) by mouth daily. 01/04/24   Jayvier Burgher H, PA-C  gabapentin (NEURONTIN) 100 MG capsule Take 100 mg by mouth daily.    [provider]  glucose blood (ACCU-CHEK AVIVA PLUS) test strip  11/27/17   [provider]  hydrALAZINE (APRESOLINE) 25 MG tablet Take 25 mg by mouth 3 (three) times daily.    [provider]  Lancets Misc. (ACCU-CHEK SOFTCLIX LANCET DEV) KIT  10/28/19   [provider]  lidocaine-prilocaine (EMLA) cream SMARTSIG:1 Topical 07/18/22   [provider]  linagliptin (TRADJENTA) 5 MG TABS tablet Take 5 mg by mouth daily.    [provider]  loratadine (CLARITIN) 10 MG tablet Take 10 mg by mouth daily. 08/04/22   [provider]  metoprolol tartrate (LOPRESSOR) 25 MG tablet Take 1 tablet (25 mg total) by mouth 2 (two) times daily. 04/18/20   Vassie Loll, MD  Multiple Vitamin (MULTIVITAMIN WITH MINERALS) TABS tablet Take 1 tablet by mouth daily.    [provider]  NIFEdipine (PROCARDIA XL/ADALAT-CC) 90 MG 24 hr tablet TAKE (1) TABLET BY MOUTH ONCE DAILY. Patient taking differently: Take 90 mg by  mouth daily before breakfast. 06/17/18   Laqueta Linden, MD  pantoprazole (PROTONIX) 40 MG tablet Take 1 tablet (40 mg total) by mouth daily before breakfast. 12/31/22   Tiffany Kocher, PA-C  phosphorus (K PHOS NEUTRAL) [phone] MG tablet Take by mouth 2 (two) times daily.    [provider]  tamsulosin (FLOMAX) 0.4 MG CAPS capsule Take by mouth daily. 09/02/22   [provider]  zolpidem (AMBIEN) 5 MG tablet Take 5 mg by mouth at bedtime as needed for sleep. 07/11/19   [provider]      Allergies    Patient has no known allergies.    Review of Systems   Review of Systems  Genitourinary:  Positive for flank pain.  All other systems reviewed and are negative.   Physical Exam Updated Vital Signs BP (!) 168/74   Pulse 87   Temp 97.8 F (36.6 C) (Oral)   Resp 18   Ht 5\' 5"  (1.651 m)   Wt 76.8 kg   SpO2 99%   BMI 28.18 kg/m  Physical Exam Vitals and nursing note reviewed.  Constitutional:      General: He is not in acute distress.    Appearance: Normal appearance.  HENT:     Head: Normocephalic and atraumatic.  Eyes:     General:        Right eye: No discharge.        Left eye: No  discharge.  Cardiovascular:     Rate and Rhythm: Normal rate and regular rhythm.     Heart sounds: No murmur heard.    No friction rub. No gallop.  Pulmonary:     Effort: Pulmonary effort is normal.     Breath sounds: Normal breath sounds.  Abdominal:     General: Bowel sounds are normal.     Palpations: Abdomen is soft.     Comments: Some right-sided flank pain tenderness, negative CVA tenderness bilaterally.  Skin:    General: Skin is warm and dry.     Capillary Refill: Capillary refill takes less than 2 seconds.  Neurological:     Mental Status: He is alert and oriented to person, place, and time.  Psychiatric:        Mood and Affect: Mood normal.        Behavior: Behavior normal.     ED Results / Procedures / Treatments   Labs (all labs  ordered are listed, but only abnormal results are displayed) Labs Reviewed  URINE CULTURE - Abnormal; Notable for the following components:      Result Value   Culture   (*)    Value: >=100,000 COLONIES/mL GROUP B STREP(S.AGALACTIAE)ISOLATED TESTING AGAINST S. AGALACTIAE NOT ROUTINELY PERFORMED DUE TO PREDICTABILITY OF AMP/PEN/VAN SUSCEPTIBILITY. Performed at [facility], [address] [place], [place] [address]    All other components within normal limits  URINALYSIS, ROUTINE W REFLEX MICROSCOPIC - Abnormal; Notable for the following components:   APPearance TURBID (*)    Protein, ur 100 (*)    Leukocytes,Ua LARGE (*)    Bacteria, UA FEW (*)    All other components within normal limits  COMPREHENSIVE METABOLIC PANEL - Abnormal; Notable for the following components:   CO2 21 (*)    Glucose, Bld 124 (*)    Creatinine, Ser 7.34 (*)    Calcium 8.5 (*)    Albumin 3.3 (*)    GFR, Estimated 7 (*)    Anion gap 16 (*)    All other components within normal limits  CBC WITH DIFFERENTIAL/PLATELET - Abnormal; Notable for the following components:   RBC 3.33 (*)    Hemoglobin 8.8 (*)    HCT 28.0 (*)    RDW 15.9 (*)    Lymphs Abs 0.6 (*)    All other components within normal limits    EKG None  Radiology CT Renal Stone Study Result Date: 01/04/2024 CLINICAL DATA:  Abdominal and flank pain with stone suspected. EXAM: CT ABDOMEN AND PELVIS WITHOUT CONTRAST TECHNIQUE: Multidetector CT imaging of the abdomen and pelvis was performed following the standard protocol without IV contrast. RADIATION DOSE REDUCTION: This exam was performed according to the departmental dose-optimization program which includes automated exposure control, adjustment of the mA and/or kV according to patient size and/or use of iterative reconstruction technique. COMPARISON:  05/29/2022 FINDINGS: Lower chest: Trace left and small right pleural effusions. Interstitial infiltrates in the lung bases. These are  progressing since prior study possibly representing chronic fibrosis with superimposed interstitial pneumonia. Small esophageal hiatal hernia. Hepatobiliary: Cholelithiasis. No bile duct dilatation. No inflammatory changes. No focal liver lesions. Pancreas: Unremarkable. No pancreatic ductal dilatation or surrounding inflammatory changes. Spleen: Normal in size without focal abnormality. Adrenals/Urinary Tract: No adrenal gland nodules. Bilateral renal cysts. Largest on the right measures 3.2 cm diameter. No change since prior study. No imaging follow-up is indicated. No hydronephrosis or hydroureter. No renal, ureteral, or bladder stones. Bladder wall is diffusely thickened  and there is hazy infiltration in the low pelvic fat. Changes may represent postradiation change or cystitis. Correlate with history and urinalysis. Stomach/Bowel: Stomach is within normal limits. Appendix appears normal. No evidence of bowel wall thickening, distention, or inflammatory changes. Vascular/Lymphatic: Aortic atherosclerosis. No enlarged abdominal or pelvic lymph nodes. Reproductive: Metallic structures in the prostate bed likely representing seed implants or possibly surgical clips. Other: No abdominal wall hernia or abnormality. No abdominopelvic ascites. Musculoskeletal: Degenerative changes in the spine. No destructive bone lesions. IMPRESSION: 1. No renal or ureteral stone or obstruction. 2. Diffuse bladder wall thickening with stranding in the adjacent fat likely representing radiation changes or possibly cystitis. Correlate with clinical history and urinalysis. 3. Aortic atherosclerosis. 4. Small bilateral pleural effusions with basilar interstitial changes increasing since prior study. This suggest chronic fibrosis with possible superimposed edema or interstitial pneumonia. Electronically Signed   By: Burman Nieves M.D.   On: 01/04/2024 19:57    Procedures Procedures    Medications Ordered in ED Medications   cefTRIAXone (ROCEPHIN) 1 g in sodium chloride 0.9 % 100 mL IVPB (0 g Intravenous Stopped 01/04/24 1926)    ED Course/ Medical Decision Making/ A&P                                 Medical Decision Making Amount and/or Complexity of Data Reviewed Labs: ordered. Radiology: ordered.  Risk Prescription drug management.   This patient is a 88 y.o. male  who presents to the ED for concern of flank pain, dysuria.   Differential diagnoses prior to evaluation: The emergent differential diagnosis includes, but is not limited to,  AAA, renal vascular thrombosis, mesenteric ischemia, pyelonephritis, nephrolithiasis, cystitis, biliary colic, pancreatitis, PUD, appendicitis, diverticulitis, bowel obstruction . This is not an exhaustive differential.   Past Medical History / Co-morbidities / Social History: Diabetes, ESRD, hypertension, GERD  Additional history: Chart reviewed. Pertinent results include: Reviewed lab work, imaging from recent previous emergency department visits, outpatient vascular surgery visits  Physical Exam: Physical exam performed. The pertinent findings include: Some right sided flank pain on palpation no significant suprapubic tenderness, vital signs overall stable other than some hypertension Emergency Department, initially 208/73 but improved to 168/74 on discharge.  Lab Tests/Imaging studies: I personally interpreted labs/imaging and the pertinent results include: CBC overall unremarkable, hemoglobin of 8.8 is slightly lower than his baseline, suspect anemia of chronic disease, he denies any recent blood loss or dark stools.  CMP is notable for elevated creatinine at 7.34 consistent with his ESRD on dialysis, he does have a mild anion gap of 16, suspect secondary to possible mild lactic acidosis in context of his known renal failure. UA is significant for leukocytes, greater than 50 white blood cells, few bacteria, white blood cell clumps, significant for acute urinary  tract infection.  CT renal stone study with no evidence of acute obstructing stone, bladder wall thickening noted consistent with cystitis.  Small bilateral pleural effusions with possible chronic changes versus interstitial pneumonia.  He is not having any acute cough, fever, chills, and without any white blood cell count, low clinical suspicion for any acute infectious pathology.. I agree with the radiologist interpretation.   Medications: I ordered medication including Rocephin for acute urinary tract infection, we will send Keflex at renal dosing, and send urine for culture..  I have reviewed the patients home medicines and have made adjustments as needed.   Disposition: After consideration of the diagnostic  results and the patients response to treatment, I feel that patient stable for discharge with acute cystitis, no evidence of obstructing stone, no evidence of sepsis.  Encourage close PCP follow-up to ensure resolution.   emergency department workup does not suggest an emergent condition requiring admission or immediate intervention beyond what has been performed at this time. The plan is: as above. The patient is safe for discharge and has been instructed to return immediately for worsening symptoms, change in symptoms or any other concerns.  Final Clinical Impression(s) / ED Diagnoses Final diagnoses:  Cystitis    Rx / DC Orders ED Discharge Orders          Ordered    cephALEXin (KEFLEX) 250 MG capsule  Daily,   Status:  Discontinued        01/04/24 2023    cephALEXin (KEFLEX) 500 MG capsule  Daily        01/04/24 2030              Olene Floss, PA-C 01/06/24 1159    Gloris Manchester, MD 01/09/24 [phone]

## 2024-01-05 DIAGNOSIS — N2581 Secondary hyperparathyroidism of renal origin: Secondary | ICD-10-CM | POA: Diagnosis not present

## 2024-01-05 DIAGNOSIS — N186 End stage renal disease: Secondary | ICD-10-CM | POA: Diagnosis not present

## 2024-01-05 DIAGNOSIS — Z992 Dependence on renal dialysis: Secondary | ICD-10-CM | POA: Diagnosis not present

## 2024-01-05 DIAGNOSIS — E1122 Type 2 diabetes mellitus with diabetic chronic kidney disease: Secondary | ICD-10-CM | POA: Diagnosis not present

## 2024-01-06 LAB — URINE CULTURE: Culture: >=100000 — AB

## 2024-01-07 ENCOUNTER — Telehealth (HOSPITAL_BASED_OUTPATIENT_CLINIC_OR_DEPARTMENT_OTHER): Payer: Self-pay

## 2024-01-07 NOTE — Telephone Encounter (Signed)
 Post ED Visit - Positive Culture Follow-up  Culture report reviewed by antimicrobial stewardship pharmacist: [facility] [x]  [place], [place].D. []  Celedonio Miyamoto, Pharm.D., BCPS AQ-ID []  Garvin Fila, Pharm.D., BCPS []  Georgina Pillion, [address].D., BCPS []  [place], [place].D., BCPS, AAHIVP []  Estella Husk, Pharm.D., BCPS, AAHIVP []  Lysle Pearl, PharmD, BCPS []  Phillips Climes, PharmD, BCPS []  Agapito Games, PharmD, BCPS []  Verlan Friends, PharmD []  Mervyn Gay, PharmD, BCPS []  Vinnie Level, PharmD  [facility] []  Len Childs, PharmD []  Greer Pickerel, PharmD []  Adalberto Cole, PharmD []  Perlie Gold, Rph []  Lonell Face) Jean Rosenthal, PharmD []  Earl Many, PharmD []  Junita Push, PharmD []  Dorna Leitz, PharmD []  Terrilee Files, PharmD []  Lynann Beaver, PharmD []  Keturah Barre, PharmD []  Loralee Pacas, PharmD []  Bernadene Person, PharmD   Positive urine culture Treated with Cephalexin, organism sensitive to the same and no further patient follow-up is required at this time.  Sandria Senter 01/07/2024, 9:10 AM

## 2024-01-08 DIAGNOSIS — N186 End stage renal disease: Secondary | ICD-10-CM | POA: Diagnosis not present

## 2024-01-08 DIAGNOSIS — N2581 Secondary hyperparathyroidism of renal origin: Secondary | ICD-10-CM | POA: Diagnosis not present

## 2024-01-08 DIAGNOSIS — Z992 Dependence on renal dialysis: Secondary | ICD-10-CM | POA: Diagnosis not present

## 2024-01-09 DIAGNOSIS — N2581 Secondary hyperparathyroidism of renal origin: Secondary | ICD-10-CM | POA: Diagnosis not present

## 2024-01-09 DIAGNOSIS — Z992 Dependence on renal dialysis: Secondary | ICD-10-CM | POA: Diagnosis not present

## 2024-01-09 DIAGNOSIS — N186 End stage renal disease: Secondary | ICD-10-CM | POA: Diagnosis not present

## 2024-01-11 DIAGNOSIS — N186 End stage renal disease: Secondary | ICD-10-CM | POA: Diagnosis not present

## 2024-01-11 DIAGNOSIS — Z992 Dependence on renal dialysis: Secondary | ICD-10-CM | POA: Diagnosis not present

## 2024-01-12 DIAGNOSIS — Z992 Dependence on renal dialysis: Secondary | ICD-10-CM | POA: Diagnosis not present

## 2024-01-12 DIAGNOSIS — N186 End stage renal disease: Secondary | ICD-10-CM | POA: Diagnosis not present

## 2024-01-12 DIAGNOSIS — N2581 Secondary hyperparathyroidism of renal origin: Secondary | ICD-10-CM | POA: Diagnosis not present

## 2024-01-14 DIAGNOSIS — N186 End stage renal disease: Secondary | ICD-10-CM | POA: Diagnosis not present

## 2024-01-14 DIAGNOSIS — N2581 Secondary hyperparathyroidism of renal origin: Secondary | ICD-10-CM | POA: Diagnosis not present

## 2024-01-14 DIAGNOSIS — Z992 Dependence on renal dialysis: Secondary | ICD-10-CM | POA: Diagnosis not present

## 2024-01-16 DIAGNOSIS — Z992 Dependence on renal dialysis: Secondary | ICD-10-CM | POA: Diagnosis not present

## 2024-01-16 DIAGNOSIS — N2581 Secondary hyperparathyroidism of renal origin: Secondary | ICD-10-CM | POA: Diagnosis not present

## 2024-01-16 DIAGNOSIS — N186 End stage renal disease: Secondary | ICD-10-CM | POA: Diagnosis not present

## 2024-01-18 DIAGNOSIS — Z992 Dependence on renal dialysis: Secondary | ICD-10-CM | POA: Diagnosis not present

## 2024-01-18 DIAGNOSIS — I10 Essential (primary) hypertension: Secondary | ICD-10-CM | POA: Diagnosis not present

## 2024-01-18 DIAGNOSIS — N3000 Acute cystitis without hematuria: Secondary | ICD-10-CM | POA: Diagnosis not present

## 2024-01-18 DIAGNOSIS — E1142 Type 2 diabetes mellitus with diabetic polyneuropathy: Secondary | ICD-10-CM | POA: Diagnosis not present

## 2024-01-19 DIAGNOSIS — N2581 Secondary hyperparathyroidism of renal origin: Secondary | ICD-10-CM | POA: Diagnosis not present

## 2024-01-19 DIAGNOSIS — Z992 Dependence on renal dialysis: Secondary | ICD-10-CM | POA: Diagnosis not present

## 2024-01-19 DIAGNOSIS — N186 End stage renal disease: Secondary | ICD-10-CM | POA: Diagnosis not present

## 2024-01-20 DIAGNOSIS — Z992 Dependence on renal dialysis: Secondary | ICD-10-CM | POA: Diagnosis not present

## 2024-01-20 DIAGNOSIS — N2581 Secondary hyperparathyroidism of renal origin: Secondary | ICD-10-CM | POA: Diagnosis not present

## 2024-01-20 DIAGNOSIS — N186 End stage renal disease: Secondary | ICD-10-CM | POA: Diagnosis not present

## 2024-01-21 DIAGNOSIS — H2513 Age-related nuclear cataract, bilateral: Secondary | ICD-10-CM | POA: Diagnosis not present

## 2024-01-21 DIAGNOSIS — H268 Other specified cataract: Secondary | ICD-10-CM | POA: Diagnosis not present

## 2024-01-21 DIAGNOSIS — Z961 Presence of intraocular lens: Secondary | ICD-10-CM | POA: Diagnosis not present

## 2024-01-21 DIAGNOSIS — E1122 Type 2 diabetes mellitus with diabetic chronic kidney disease: Secondary | ICD-10-CM | POA: Diagnosis not present

## 2024-01-21 DIAGNOSIS — H25812 Combined forms of age-related cataract, left eye: Secondary | ICD-10-CM | POA: Diagnosis not present

## 2024-01-21 DIAGNOSIS — E1136 Type 2 diabetes mellitus with diabetic cataract: Secondary | ICD-10-CM | POA: Diagnosis not present

## 2024-01-21 DIAGNOSIS — N186 End stage renal disease: Secondary | ICD-10-CM | POA: Diagnosis not present

## 2024-01-23 ENCOUNTER — Emergency Department (HOSPITAL_COMMUNITY)

## 2024-01-23 ENCOUNTER — Observation Stay (HOSPITAL_COMMUNITY)

## 2024-01-23 ENCOUNTER — Observation Stay (HOSPITAL_COMMUNITY)
Admission: EM | Admit: 2024-01-23 | Discharge: 2024-01-24 | Disposition: A | Discharge status: Home / Self Care | Attending: Emergency Medicine | Admitting: Emergency Medicine

## 2024-01-23 ENCOUNTER — Other Ambulatory Visit: Payer: Self-pay

## 2024-01-23 ENCOUNTER — Encounter (HOSPITAL_COMMUNITY): Payer: Self-pay

## 2024-01-23 DIAGNOSIS — R5381 Other malaise: Secondary | ICD-10-CM | POA: Diagnosis not present

## 2024-01-23 DIAGNOSIS — E1140 Type 2 diabetes mellitus with diabetic neuropathy, unspecified: Secondary | ICD-10-CM | POA: Insufficient documentation

## 2024-01-23 DIAGNOSIS — K219 Gastro-esophageal reflux disease without esophagitis: Secondary | ICD-10-CM | POA: Diagnosis not present

## 2024-01-23 DIAGNOSIS — R0989 Other specified symptoms and signs involving the circulatory and respiratory systems: Secondary | ICD-10-CM | POA: Diagnosis not present

## 2024-01-23 DIAGNOSIS — Z794 Long term (current) use of insulin: Secondary | ICD-10-CM | POA: Insufficient documentation

## 2024-01-23 DIAGNOSIS — E1169 Type 2 diabetes mellitus with other specified complication: Secondary | ICD-10-CM | POA: Diagnosis present

## 2024-01-23 DIAGNOSIS — Z87891 Personal history of nicotine dependence: Secondary | ICD-10-CM | POA: Diagnosis not present

## 2024-01-23 DIAGNOSIS — Z7901 Long term (current) use of anticoagulants: Secondary | ICD-10-CM | POA: Insufficient documentation

## 2024-01-23 DIAGNOSIS — E1129 Type 2 diabetes mellitus with other diabetic kidney complication: Secondary | ICD-10-CM | POA: Diagnosis present

## 2024-01-23 DIAGNOSIS — Z992 Dependence on renal dialysis: Secondary | ICD-10-CM | POA: Insufficient documentation

## 2024-01-23 DIAGNOSIS — I10 Essential (primary) hypertension: Secondary | ICD-10-CM | POA: Diagnosis present

## 2024-01-23 DIAGNOSIS — Z743 Need for continuous supervision: Secondary | ICD-10-CM | POA: Diagnosis not present

## 2024-01-23 DIAGNOSIS — R0600 Dyspnea, unspecified: Secondary | ICD-10-CM | POA: Diagnosis not present

## 2024-01-23 DIAGNOSIS — E1121 Type 2 diabetes mellitus with diabetic nephropathy: Secondary | ICD-10-CM | POA: Diagnosis present

## 2024-01-23 DIAGNOSIS — E785 Hyperlipidemia, unspecified: Secondary | ICD-10-CM | POA: Diagnosis not present

## 2024-01-23 DIAGNOSIS — I120 Hypertensive chronic kidney disease with stage 5 chronic kidney disease or end stage renal disease: Secondary | ICD-10-CM | POA: Diagnosis not present

## 2024-01-23 DIAGNOSIS — R918 Other nonspecific abnormal finding of lung field: Secondary | ICD-10-CM | POA: Diagnosis not present

## 2024-01-23 DIAGNOSIS — I517 Cardiomegaly: Secondary | ICD-10-CM | POA: Diagnosis not present

## 2024-01-23 DIAGNOSIS — J80 Acute respiratory distress syndrome: Secondary | ICD-10-CM | POA: Diagnosis not present

## 2024-01-23 DIAGNOSIS — R54 Age-related physical debility: Principal | ICD-10-CM | POA: Insufficient documentation

## 2024-01-23 DIAGNOSIS — J9601 Acute respiratory failure with hypoxia: Secondary | ICD-10-CM | POA: Diagnosis not present

## 2024-01-23 DIAGNOSIS — J811 Chronic pulmonary edema: Secondary | ICD-10-CM | POA: Diagnosis not present

## 2024-01-23 DIAGNOSIS — E1122 Type 2 diabetes mellitus with diabetic chronic kidney disease: Secondary | ICD-10-CM | POA: Insufficient documentation

## 2024-01-23 DIAGNOSIS — J90 Pleural effusion, not elsewhere classified: Secondary | ICD-10-CM | POA: Diagnosis not present

## 2024-01-23 DIAGNOSIS — R0602 Shortness of breath: Secondary | ICD-10-CM | POA: Diagnosis not present

## 2024-01-23 DIAGNOSIS — R739 Hyperglycemia, unspecified: Secondary | ICD-10-CM | POA: Diagnosis not present

## 2024-01-23 DIAGNOSIS — R0902 Hypoxemia: Secondary | ICD-10-CM | POA: Diagnosis not present

## 2024-01-23 DIAGNOSIS — Z79899 Other long term (current) drug therapy: Secondary | ICD-10-CM | POA: Insufficient documentation

## 2024-01-23 DIAGNOSIS — N186 End stage renal disease: Secondary | ICD-10-CM | POA: Insufficient documentation

## 2024-01-23 DIAGNOSIS — Z299 Encounter for prophylactic measures, unspecified: Secondary | ICD-10-CM | POA: Diagnosis not present

## 2024-01-23 LAB — CBC WITH DIFFERENTIAL/PLATELET
Abs Immature Granulocytes: 0.07 10*3/uL (ref 0.00–0.07)
Basophils Absolute: 0 10*3/uL (ref 0.0–0.1)
Basophils Relative: 1 %
Eosinophils Absolute: 0.2 10*3/uL (ref 0.0–0.5)
Eosinophils Relative: 2 %
HCT: 25.7 % — ABNORMAL LOW (ref 39.0–52.0)
Hemoglobin: 8.4 g/dL — ABNORMAL LOW (ref 13.0–17.0)
Immature Granulocytes: 1 %
Lymphocytes Relative: 13 %
Lymphs Abs: 1.2 10*3/uL (ref 0.7–4.0)
MCH: 27.5 pg (ref 26.0–34.0)
MCHC: 32.7 g/dL (ref 30.0–36.0)
MCV: 84 fL (ref 80.0–100.0)
Monocytes Absolute: 0.7 10*3/uL (ref 0.1–1.0)
Monocytes Relative: 8 %
Neutro Abs: 6.6 10*3/uL (ref 1.7–7.7)
Neutrophils Relative %: 75 %
Platelets: 222 10*3/uL (ref 150–400)
RBC: 3.06 MIL/uL — ABNORMAL LOW (ref 4.22–5.81)
RDW: 15.8 % — ABNORMAL HIGH (ref 11.5–15.5)
WBC: 8.7 10*3/uL (ref 4.0–10.5)
nRBC: 0 % (ref 0.0–0.2)

## 2024-01-23 LAB — RENAL FUNCTION PANEL
Albumin: 3.1 g/dL — ABNORMAL LOW (ref 3.5–5.0)
Anion gap: 14 (ref 5–15)
BUN: 14 mg/dL (ref 8–23)
CO2: 28 mmol/L (ref 22–32)
Calcium: 8.8 mg/dL — ABNORMAL LOW (ref 8.9–10.3)
Chloride: 93 mmol/L — ABNORMAL LOW (ref 98–111)
Creatinine, Ser: 4.81 mg/dL — ABNORMAL HIGH (ref 0.61–1.24)
GFR, Estimated: 11 mL/min — ABNORMAL LOW (ref >60)
Glucose, Bld: 101 mg/dL — ABNORMAL HIGH (ref 70–99)
Phosphorus: 3.3 mg/dL (ref 2.5–4.6)
Potassium: 4.1 mmol/L (ref 3.5–5.1)
Sodium: 135 mmol/L (ref 135–145)

## 2024-01-23 LAB — COMPREHENSIVE METABOLIC PANEL WITH GFR
ALT: 13 U/L (ref 0–44)
AST: 27 U/L (ref 15–41)
Albumin: 3.1 g/dL — ABNORMAL LOW (ref 3.5–5.0)
Alkaline Phosphatase: 70 U/L (ref 38–126)
Anion gap: 15 (ref 5–15)
BUN: 24 mg/dL — ABNORMAL HIGH (ref 8–23)
CO2: 20 mmol/L — ABNORMAL LOW (ref 22–32)
Calcium: 8.2 mg/dL — ABNORMAL LOW (ref 8.9–10.3)
Chloride: 101 mmol/L (ref 98–111)
Creatinine, Ser: 7.64 mg/dL — ABNORMAL HIGH (ref 0.61–1.24)
GFR, Estimated: 6 mL/min — ABNORMAL LOW (ref >60)
Glucose, Bld: 282 mg/dL — ABNORMAL HIGH (ref 70–99)
Potassium: 3.6 mmol/L (ref 3.5–5.1)
Sodium: 136 mmol/L (ref 135–145)
Total Bilirubin: 0.7 mg/dL (ref 0.0–1.2)
Total Protein: 7.2 g/dL (ref 6.5–8.1)

## 2024-01-23 LAB — GLUCOSE, CAPILLARY: Glucose-Capillary: 135 mg/dL — ABNORMAL HIGH (ref 70–99)

## 2024-01-23 LAB — BRAIN NATRIURETIC PEPTIDE: B Natriuretic Peptide: 734 pg/mL — ABNORMAL HIGH (ref 0.0–100.0)

## 2024-01-23 LAB — HEPATITIS B SURFACE ANTIGEN: Hepatitis B Surface Ag: NONREACTIVE

## 2024-01-23 MED ORDER — HEPARIN SODIUM (PORCINE) 5000 UNIT/ML IJ SOLN
5000.0000 [IU] | Freq: Three times a day (TID) | INTRAMUSCULAR | Status: DC
  Administered 2024-01-23 – 2024-01-24 (×3): 5000 [IU] via SUBCUTANEOUS
  Filled 2024-01-23 (×3): qty 1

## 2024-01-23 MED ORDER — PENTAFLUOROPROP-TETRAFLUOROETH EX AERO: 1.0000 | INHALATION_SPRAY | CUTANEOUS | Status: DC | PRN

## 2024-01-23 MED ORDER — PANTOPRAZOLE SODIUM 40 MG PO TBEC
40.0000 mg | DELAYED_RELEASE_TABLET | Freq: Every day | ORAL | Status: DC
  Administered 2024-01-24: 40 mg via ORAL
  Filled 2024-01-23: qty 1

## 2024-01-23 MED ORDER — TAMSULOSIN HCL 0.4 MG PO CAPS
0.4000 mg | ORAL_CAPSULE | Freq: Every day | ORAL | Status: DC
  Administered 2024-01-24: 0.4 mg via ORAL
  Filled 2024-01-23: qty 1

## 2024-01-23 MED ORDER — ACETAMINOPHEN 650 MG RE SUPP: 650.0000 mg | Freq: Four times a day (QID) | RECTAL | Status: DC | PRN

## 2024-01-23 MED ORDER — LIDOCAINE-PRILOCAINE 2.5-2.5 % EX CREA: 1.0000 | TOPICAL_CREAM | CUTANEOUS | Status: DC | PRN

## 2024-01-23 MED ORDER — METOPROLOL TARTRATE 25 MG PO TABS
25.0000 mg | ORAL_TABLET | Freq: Every day | ORAL | Status: DC
  Administered 2024-01-24: 25 mg via ORAL
  Filled 2024-01-23: qty 1

## 2024-01-23 MED ORDER — ALBUTEROL SULFATE HFA 108 (90 BASE) MCG/ACT IN AERS: 2.0000 | INHALATION_SPRAY | RESPIRATORY_TRACT | Status: DC | PRN

## 2024-01-23 MED ORDER — LINAGLIPTIN 5 MG PO TABS
5.0000 mg | ORAL_TABLET | Freq: Every day | ORAL | Status: DC
  Administered 2024-01-24: 5 mg via ORAL
  Filled 2024-01-23: qty 1

## 2024-01-23 MED ORDER — HYDRALAZINE HCL 20 MG/ML IJ SOLN: 5.0000 mg | INTRAMUSCULAR | Status: DC | PRN

## 2024-01-23 MED ORDER — GABAPENTIN 100 MG PO CAPS
100.0000 mg | ORAL_CAPSULE | Freq: Every day | ORAL | Status: DC
  Administered 2024-01-24: 100 mg via ORAL
  Filled 2024-01-23: qty 1

## 2024-01-23 MED ORDER — LIDOCAINE HCL (PF) 1 % IJ SOLN: 5.0000 mL | INTRAMUSCULAR | Status: DC | PRN

## 2024-01-23 MED ORDER — NIFEDIPINE ER OSMOTIC RELEASE 30 MG PO TB24
90.0000 mg | ORAL_TABLET | Freq: Every day | ORAL | Status: DC
  Administered 2024-01-24: 90 mg via ORAL
  Filled 2024-01-23: qty 3

## 2024-01-23 MED ORDER — ACETAMINOPHEN 325 MG PO TABS: 650.0000 mg | ORAL_TABLET | Freq: Four times a day (QID) | ORAL | Status: DC | PRN

## 2024-01-23 MED ORDER — HYDRALAZINE HCL 25 MG PO TABS
50.0000 mg | ORAL_TABLET | Freq: Two times a day (BID) | ORAL | Status: DC
  Administered 2024-01-23 – 2024-01-24 (×2): 50 mg via ORAL
  Filled 2024-01-23 (×2): qty 2

## 2024-01-23 NOTE — Progress Notes (Signed)
 IS @ bedside. Patient sleeping at this time.

## 2024-01-23 NOTE — Care Management Obs Status (Signed)
 MEDICARE OBSERVATION STATUS NOTIFICATION   Patient Details  Name: Steve [name] MRN: [id number] Date of Birth: [birth year]   Medicare Observation Status Notification Given:   YES.    Lynda Sands, RN 01/23/2024, 6:55 PM

## 2024-01-23 NOTE — Plan of Care (Signed)
 Rested well during the night. No c/o SOB. Remains on 2L oxygen with oxygen sats WNL.   Problem: Education: Goal: Knowledge of General Education information will improve Description: Including pain rating scale, medication(s)/side effects and non-pharmacologic comfort measures Outcome: Progressing   Problem: Health Behavior/Discharge Planning: Goal: Ability to manage health-related needs will improve Outcome: Progressing   Problem: Clinical Measurements: Goal: Ability to maintain clinical measurements within normal limits will improve Outcome: Progressing Goal: Will remain free from infection Outcome: Progressing Goal: Diagnostic test results will improve Outcome: Progressing Goal: Respiratory complications will improve Outcome: Progressing Goal: Cardiovascular complication will be avoided Outcome: Progressing   Problem: Activity: Goal: Risk for activity intolerance will decrease Outcome: Progressing   Problem: Nutrition: Goal: Adequate nutrition will be maintained Outcome: Progressing   Problem: Coping: Goal: Level of anxiety will decrease Outcome: Progressing   Problem: Elimination: Goal: Will not experience complications related to bowel motility Outcome: Progressing Goal: Will not experience complications related to urinary retention Outcome: Progressing   Problem: Pain Managment: Goal: General experience of comfort will improve and/or be controlled Outcome: Progressing   Problem: Safety: Goal: Ability to remain free from injury will improve Outcome: Progressing   Problem: Skin Integrity: Goal: Risk for impaired skin integrity will decrease Outcome: Progressing

## 2024-01-23 NOTE — H&P (Signed)
 [facility] H&P   Patient Demographics:    Steve [name], is a 88 y.o. male  MRN: [id number]   DOB - 1936-08-12  Admit Date - 01/23/2024  Outpatient Primary MD for the patient is Wyvonna Heidelberg, MD  Referring MD/NP/PA: DR Isaiah Marc  Patient coming from: home  Chief Complaint  Patient presents with   Shortness of Breath      HPI:    Steve [name]  is a 88 y.o. male, with medical history significant for type II diabetes mellitus, hypertension, ESRD on hemodialysis TTS schedule, with recent thoracic surgery on Thursday, so she received her dialysis on Thursday and Wednesday, supposed to go for dialysis today, but patient reports shortness of breath, was found by EMS to be hypoxic saturating 88%, where he was started on 2 L oxygen and sent to ED for further evaluation, patient required BiPAP initially while in ED, he was dialyzed his full session today by renal, after dialysis session he remained on 1 L oxygen, and was noted to be significantly weak and deconditioned for which cleared hospitalist requested to admit for observation overnight, he denies any focal deficits, tingling or numbness, his workup significant for volume overload chest x-ray, hemoglobin has been stable at 8.4, [facility] hospitalist consulted to admit    Review of systems:     A full 10 point Review of Systems was done, except as stated above, all other Review of Systems were negative.   With Past History of the following :    Past Medical History:  Diagnosis Date   Anemia    Arthritis    ESRD (end stage renal disease) on dialysis (HCC)    T/TH/Sat   Essential hypertension    GERD (gastroesophageal reflux disease)    Hyperlipidemia    Prostate cancer (HCC)    took radiation   Type 2 diabetes mellitus (HCC)       Past Surgical History:  Procedure Laterality Date   ANTERIOR CERVICAL DECOMP/DISCECTOMY  FUSION N/A 08/23/2020   Procedure: Cervical 3-4 Anterior cervical decompression/discectomy/fusion;  Surgeon: Manya Sells, MD;  Location: [facility] OR;  Service: Neurosurgery;  Laterality: N/A;  3C/RM 21 to follow   AV FISTULA PLACEMENT Left 02/21/2021   Procedure: INSERTION OF ARTERIOVENOUS GORE-TEX GRAFT LEFT ARM;  Surgeon: Mayo Speck, MD;  Location: [facility] ORS;  Service: Vascular;  Laterality: Left;   BIOPSY  10/22/2022   Procedure: BIOPSY;  Surgeon: Suzette Espy, MD;  Location: [facility] SUITE;  Service: Endoscopy;;   CATARACT EXTRACTION W/PHACO Right 04/28/2016   Procedure: CATARACT EXTRACTION PHACO AND INTRAOCULAR LENS PLACEMENT (IOC);  Surgeon: Clay Cummins, MD;  Location: [facility]S;  Service: Ophthalmology;  Laterality: Right;  CDE: 4.28   COLONOSCOPY     about 2 years in [place] around 2016   CYSTECTOMY     pt. denies   ESOPHAGOGASTRODUODENOSCOPY (EGD) WITH PROPOFOL N/A 10/22/2022  Procedure: ESOPHAGOGASTRODUODENOSCOPY (EGD) WITH PROPOFOL;  Surgeon: Suzette Espy, MD;  Location: [facility];  Service: Endoscopy;  Laterality: N/A;  12:30 pm, pt know to arrive at 9:15, LM to see if pt can come earlier   IR FLUORO GUIDE CV LINE LEFT  12/20/2020   IR US  GUIDE VASC ACCESS LEFT  12/20/2020   MALONEY DILATION N/A 10/22/2022   Procedure: Londa Rival DILATION;  Surgeon: Suzette Espy, MD;  Location: [facility];  Service: Endoscopy;  Laterality: N/A;      Social History:     Social History   Tobacco Use   Smoking status: Former    Current packs/day: 0.00    Average packs/day: 2.0 packs/day for 32.9 years (65.9 ttl pk-yrs)    Types: Pipe, Cigars, Cigarettes    Start date: 11/08/1954    Quit date: 10/14/1987    Years since quitting: 36.3   Smokeless tobacco: Never   Tobacco comments:    About 2 pipes a day  Substance Use Topics   Alcohol use: No    Alcohol/week: 0.0 standard drinks of alcohol        Family History :     Family History  Problem Relation Age of Onset   Heart  disease Mother        cause of death   Diabetes Sister    Colon cancer Neg Hx      Home Medications:   Prior to Admission medications   Medication Sig Start Date End Date Taking? Authorizing Provider  gabapentin (NEURONTIN) 100 MG capsule Take 100 mg by mouth daily.   Yes [provider]  hydrALAZINE (APRESOLINE) 50 MG tablet Take 50 mg by mouth in the morning and at bedtime.   Yes [provider]  linagliptin (TRADJENTA) 5 MG TABS tablet Take 5 mg by mouth daily.   Yes [provider]  metoprolol tartrate (LOPRESSOR) 25 MG tablet Take 1 tablet (25 mg total) by mouth 2 (two) times daily. Patient taking differently: Take 25 mg by mouth daily. 04/18/20  Yes Justina Oman, MD  Multiple Vitamin (MULTIVITAMIN WITH MINERALS) TABS tablet Take 1 tablet by mouth daily.   Yes [provider]  NIFEdipine (PROCARDIA XL/ADALAT-CC) 90 MG 24 hr tablet TAKE (1) TABLET BY MOUTH ONCE DAILY. Patient taking differently: Take 90 mg by mouth daily before breakfast. 06/17/18  Yes Flavia Hughs, MD  pantoprazole (PROTONIX) 40 MG tablet Take 1 tablet (40 mg total) by mouth daily before breakfast. 12/31/22  Yes Lanney Pitts, PA-C  sulfamethoxazole-trimethoprim (BACTRIM DS) 800-160 MG tablet Take 1 tablet by mouth 2 (two) times daily. 01/18/24  Yes [provider]  tamsulosin (FLOMAX) 0.4 MG CAPS capsule Take 0.4 mg by mouth daily. 09/02/22  Yes [provider]  aspirin 81 MG tablet Take 81 mg by mouth daily.    [provider]  lidocaine-prilocaine (EMLA) cream SMARTSIG:1 Topical 07/18/22   [provider]  loratadine (CLARITIN) 10 MG tablet Take 10 mg by mouth daily. 08/04/22   [provider]  phosphorus (K PHOS NEUTRAL) 155-852-130 MG tablet Take by mouth 2 (two) times daily.    [provider]  zolpidem (AMBIEN) 5 MG tablet Take 5 mg by mouth at bedtime as needed for sleep. 07/11/19   [provider]      Allergies:    No Known Allergies   Physical Exam:   Vitals  Blood pressure 102/60, pulse 89, temperature 97.9 F (36.6 C), temperature source Axillary, resp.  rate 18, height 5\' 5"  (1.651 m), weight 73.2 kg, SpO2 94%.   1. General Elderly male, laying in bed, no apparent distress  2. Normal affect and insight, Not Suicidal or Homicidal, Awake Alert, Oriented X 3.  3. No F.N deficits, ALL C.Nerves Intact, Strength 5/5 all 4 extremities, Sensation intact all 4 extremities, Plantars down going.  4. Ears and Eyes appear Normal, Conjunctivae clear, PERRLA. Moist Oral Mucosa.  5. Supple Neck, No JVD, No cervical lymphadenopathy appriciated, No Carotid Bruits.  6. Symmetrical Chest wall movement, decreased air entry at the bases with scattered rhonchi  7. RRR, No Gallops, Rubs or Murmurs, No Parasternal Heave.  8. Positive Bowel Sounds, Abdomen Soft, No tenderness, No organomegaly appriciated,No rebound -guarding or rigidity.  9.  No Cyanosis, Normal Skin Turgor, No Skin Rash or Bruise.  10. Good muscle tone,  joints appear normal , no effusions, Normal ROM.     Data Review:    CBC Recent Labs  Lab 01/23/24 0530  WBC 8.7  HGB 8.4*  HCT 25.7*  PLT 222  MCV 84.0  MCH 27.5  MCHC 32.7  RDW 15.8*  LYMPHSABS 1.2  MONOABS 0.7  EOSABS 0.2  BASOSABS 0.0   ------------------------------------------------------------------------------------------------------------------  Chemistries  Recent Labs  Lab 01/23/24 0530  NA 136  K 3.6  CL 101  CO2 20*  GLUCOSE 282*  BUN 24*  CREATININE 7.64*  CALCIUM 8.2*  AST 27  ALT 13  ALKPHOS 70  BILITOT 0.7   ------------------------------------------------------------------------------------------------------------------ estimated creatinine clearance is 6 mL/min (A) (by C-G formula based on SCr of 7.64 mg/dL  (H)). ------------------------------------------------------------------------------------------------------------------ No results for input(s): "TSH", "T4TOTAL", "T3FREE", "THYROIDAB" in the last 72 hours.  Invalid input(s): "FREET3"  Coagulation profile No results for input(s): "INR", "PROTIME" in the last 168 hours. ------------------------------------------------------------------------------------------------------------------- No results for input(s): "DDIMER" in the last 72 hours. -------------------------------------------------------------------------------------------------------------------  Cardiac Enzymes No results for input(s): "CKMB", "TROPONINI", "MYOGLOBIN" in the last 168 hours.  Invalid input(s): "CK" ------------------------------------------------------------------------------------------------------------------    Component Value Date/Time   BNP 734.0 (H) 01/23/2024 0530     ---------------------------------------------------------------------------------------------------------------  Urinalysis    Component Value Date/Time   COLORURINE YELLOW 01/04/2024 1515   APPEARANCEUR TURBID (A) 01/04/2024 1515   APPEARANCEUR Clear 07/09/2021 1453   LABSPEC 1.009 01/04/2024 1515   PHURINE 8.0 01/04/2024 1515   GLUCOSEU NEGATIVE 01/04/2024 1515   HGBUR NEGATIVE 01/04/2024 1515   BILIRUBINUR NEGATIVE 01/04/2024 1515   BILIRUBINUR Negative 07/09/2021 1453   KETONESUR NEGATIVE 01/04/2024 1515   PROTEINUR 100 (A) 01/04/2024 1515   UROBILINOGEN negative (A) 02/14/2020 0951   NITRITE NEGATIVE 01/04/2024 1515   LEUKOCYTESUR LARGE (A) 01/04/2024 1515    ----------------------------------------------------------------------------------------------------------------   Imaging Results:    DG Chest Port 1 View Result Date: 01/23/2024 CLINICAL DATA:  Hypoxia EXAM: PORTABLE CHEST 1 VIEW COMPARISON:  Chest x-ray 01/23/2024 FINDINGS: The heart is enlarged, unchanged.  There is central pulmonary vascular congestion. There is a small right pleural effusion. There are some minimal patchy and strandy opacities in the right mid lung and right lung base similar to prior. There is no pneumothorax or acute fracture. IMPRESSION: 1. Stable cardiomegaly with central pulmonary vascular congestion and small right pleural effusion. 2. Stable right mid and lower lung opacities. Electronically Signed   By: Tyron Gallon M.D.   On: 01/23/2024 16:15   DG Chest Port 1 View Result Date: 01/23/2024 CLINICAL DATA:  Shortness of breath EXAM: PORTABLE CHEST 1 VIEW COMPARISON:  06/09/2023 FINDINGS: Diffuse interstitial and airspace opacity  similar to prior. Cardiomegaly and vascular pedicle widening. Small pleural effusions are likely. No pneumothorax. Extensive artifact from EKG leads. IMPRESSION: Symmetric airspace disease favoring edema. Chronic cardiomegaly. Electronically Signed   By: Ronnette Coke M.D.   On: 01/23/2024 06:32    EKG:  Vent. rate 118 BPM PR interval 143 ms QRS duration 109 ms QT/QTcB 347/487 ms P-R-T axes 39 -58 112 Sinus tachycardia Probable left atrial enlargement LAD, consider left anterior fascicular block LVH with secondary repolarization abnormality Borderline prolonged QT interval Confirmed by Donita Furrow ([id number]) on 01/23/2024 6:  Assessment & Plan:    Principal Problem:   Debility Active Problems:   HTN (hypertension)   DM (diabetes mellitus), type 2 with renal complications (HCC)   Hyperlipidemia   Type 2 diabetes mellitus with diabetic nephropathy (HCC)   Gastroesophageal reflux disease   ESRD (end stage renal disease) (HCC)    Debility, deconditioning -Patient lives home by himself, neighbors assist with his care, he has been with deconditioning over last few days, so we will consult PT, OT, likely he will need home health at time of discharge.  Acute respiratory failure with hypoxia Dyspnea Volume overload Acute on chronic  diastolic CHF -Workup with evidence of vascular congestion, volume overload due to change of dialysis schedule, he was dialyzed today, after that he has been maintaining 90% on room air, x-ray showing some atelectasis he was encouraged to use incentive spirometer -Patient was dialyzed today while in ED, respiratory status much improved, he did require BiPAP initially. -Fully he will not need oxygen, keep using his incentive spirometer, but will monitor closely and if he has increased oxygen requirement, then likely he will need dialysis before Tuesday.  ESRD -Please see above discussion  Hypertension -continue home medication hydralazine, Lopressor, nifedipine   hyperlipidemia -Continue statin  Type 2 diabetes with nephropathy -Does appear controlled with Tradjenta, will resume, will monitor CBG and if elevated then will start on insulin sliding scale  diabetic neuropathy -continue daily neuronitn .   GERD -continue PPI    DVT Prophylaxis Heparin   AM Labs Ordered, also please review Full Orders  Family Communication: Admission, patients condition and plan of care including tests being ordered have been discussed with the patient and son at bedside* who indicate understanding and agree with the plan and Code Status.  Code Status full code, but patient does not wish to be kept on life support of poor prognosis or prolonged need of life support  Likely DC to home  Consults called: Renal dialyzed in ED, they will need to be recalled if patient remains admitted by tomorrow  Admission status: Observation  Time spent in minutes : 70 minutes   Seena Dadds M.D on 01/23/2024 at 4:39 PM   [facility] - Office  [phone]

## 2024-01-23 NOTE — ED Provider Notes (Signed)
   ED Course / MDM   Clinical Course as of 01/23/24 1523  Sat Jan 23, 2024  0624 Spoke to Dr. Jearldine Mina, nephrology.  Will plan to dialyze him today. [CH]  0707 Received sign out from Dr. Carylon Claude pending dialysis. Came with shortness of breath. Possible discharge after dialysis.  [WS]  1514 Patient completed dialysis. Remains borderline hypoxic dipping down in to the 88-89% on room air. He lives alone and feels too weak to go home. Family would prefer he is observed. I will discuss with the hospitalist [WS]  1522 Signed out to Dr. Bryna Car pending hospitalist consultation.  [WS]    Clinical Course User Index [CH] Horton, Vonzella Guernsey, MD [WS] Mordecai Applebaum, MD   Medical Decision Making Amount and/or Complexity of Data Reviewed Labs: ordered. Radiology: ordered.  Risk Prescription drug management.      Mordecai Applebaum, MD 01/23/24 [phone]

## 2024-01-23 NOTE — Progress Notes (Signed)
  HEMODIALYSIS TREATMENT NOTE:  HD was performed in ED due to patient requiring Bi-PAP.  Left forearm loop AVG is pulsatile, but without palpable thrill.  Retrograde flow confirmed.  High venous pressures prompted lowering of Qb.  BiPAP was removed halfway through session, after 2L ultrafiltrate removed.  Room air sats 88% at that time and O2 was supplemented via [place].  4 hour treatment completed.  Net UF 3.6 liters.  All blood was returned.  Hemostasis was achieved in 15 minutes.  Room air sats 89% in bed; actually improved when standing to 94%.    Post-HD:  dyspnea is resolved.  Pt feels weak, as per usual after dialysis.  But he is afraid to go home as he lives alone.  Post-tx:  01/23/24 1445  Vital Signs  Temp 97.9 F (36.6 C)  Temp Source Axillary  Pulse Rate 89  Resp 18  BP 102/60  BP Location Right Arm  BP Method Automatic  Patient Position (if appropriate) Standing  Oxygen Therapy  SpO2 94 %  O2 Device Room Air  Pain Assessment  Pain Score 0  Dialysis Weight  Weight 73.2 kg  Type of Weight Post-Dialysis  Post Treatment  Dialyzer Clearance Lightly streaked  Hemodialysis Intake (mL) 0 mL  Liters Processed 76.7  Fluid Removed (mL) 3600 mL  Tolerated HD Treatment Yes  Post-Hemodialysis Comments Goal met  AVG/AVF Arterial Site Held (minutes) 7 minutes  AVG/AVF Venous Site Held (minutes) 7 minutes  Fistula / Graft Left Forearm Arteriovenous vein graft  Placement Date/Time: 02/21/21 7846   Placed prior to admission: No  Orientation: Left  Access Location: Forearm  Access Type: (c) Arteriovenous vein graft  Fistula / Graft Assessment Bruit;Other (Comment) (pulsatile, but no palpable thrill)  Status Patent    Dyspnea has resolved, but pt reports feeling too weak to go home.   Dr. Isaiah Marc is aware.  Waunita Haff, RN [facility] [phone]

## 2024-01-23 NOTE — ED Provider Notes (Signed)
  EMERGENCY DEPARTMENT AT [facility] Provider Note   CSN: [id number] Arrival date & time: 01/23/24  7829     History  Chief Complaint  Patient presents with   Shortness of Breath    Steve [name] is a 88 y.o. male.  HPI     This is an 88 year old male with a history of end-stage renal disease on dialysis Tuesday, Thursday, Saturday who presents with shortness of breath.  Onset of symptoms this morning.  This past week he had dialysis on Tuesday and Wednesday as he had an appointment later in the week.  He was found by EMS this morning to have oxygen saturations of 88%.  He was placed on 2 L.  He denies any recent fevers.  Denies chest pain.  Home Medications Prior to Admission medications   Medication Sig Start Date End Date Taking? Authorizing Provider  Accu-Chek Softclix Lancets lancets  10/27/19   [provider]  aspirin 81 MG tablet Take 81 mg by mouth daily.    [provider]  cephALEXin (KEFLEX) 500 MG capsule Take 1 capsule (500 mg total) by mouth daily. 01/04/24   Prosperi, Christian H, PA-C  gabapentin (NEURONTIN) 100 MG capsule Take 100 mg by mouth daily.    [provider]  glucose blood (ACCU-CHEK AVIVA PLUS) test strip  11/27/17   [provider]  hydrALAZINE (APRESOLINE) 25 MG tablet Take 25 mg by mouth 3 (three) times daily.    [provider]  Lancets Misc. (ACCU-CHEK SOFTCLIX LANCET DEV) KIT  10/28/19   [provider]  lidocaine-prilocaine (EMLA) cream SMARTSIG:1 Topical 07/18/22   [provider]  linagliptin (TRADJENTA) 5 MG TABS tablet Take 5 mg by mouth daily.    [provider]  loratadine (CLARITIN) 10 MG tablet Take 10 mg by mouth daily. 08/04/22   [provider]  metoprolol tartrate (LOPRESSOR) 25 MG tablet Take 1 tablet (25 mg total) by mouth 2 (two) times daily. 04/18/20   Vassie Loll, MD  Multiple Vitamin (MULTIVITAMIN WITH MINERALS) TABS tablet Take 1  tablet by mouth daily.    [provider]  NIFEdipine (PROCARDIA XL/ADALAT-CC) 90 MG 24 hr tablet TAKE (1) TABLET BY MOUTH ONCE DAILY. Patient taking differently: Take 90 mg by mouth daily before breakfast. 06/17/18   Laqueta Linden, MD  pantoprazole (PROTONIX) 40 MG tablet Take 1 tablet (40 mg total) by mouth daily before breakfast. 12/31/22   Tiffany Kocher, PA-C  phosphorus (K PHOS NEUTRAL) [phone] MG tablet Take by mouth 2 (two) times daily.    [provider]  tamsulosin (FLOMAX) 0.4 MG CAPS capsule Take by mouth daily. 09/02/22   [provider]  zolpidem (AMBIEN) 5 MG tablet Take 5 mg by mouth at bedtime as needed for sleep. 07/11/19   [provider]      Allergies    Patient has no known allergies.    Review of Systems   Review of Systems  Constitutional:  Negative for fever.  Respiratory:  Positive for shortness of breath. Negative for cough.   Cardiovascular:  Negative for chest pain and leg swelling.  All other systems reviewed and are negative.   Physical Exam Updated Vital Signs BP (!) 148/72   Pulse 92   Temp 97.8 F (36.6 C)   Resp (!) 30   Ht 1.651 m (5\' 5" )   Wt 76.7 kg   SpO2 99%   BMI 28.12 kg/m  Physical Exam Vitals and nursing  note reviewed.  Constitutional:      Appearance: He is well-developed. He is ill-appearing. He is not toxic-appearing.  HENT:     Head: Normocephalic and atraumatic.  Eyes:     Pupils: Pupils are equal, round, and reactive to light.  Cardiovascular:     Rate and Rhythm: Regular rhythm. Tachycardia present.     Heart sounds: Normal heart sounds. No murmur heard. Pulmonary:     Effort: Tachypnea and respiratory distress present.     Breath sounds: Rales present. No wheezing.     Comments: Increased respiratory rate, speaking in short sentences, Rales in all lung fields Abdominal:     General: Bowel sounds are normal.     Palpations: Abdomen is soft.     Tenderness: There is no  abdominal tenderness. There is no rebound.  Musculoskeletal:     Cervical back: Neck supple.  Lymphadenopathy:     Cervical: No cervical adenopathy.  Skin:    General: Skin is warm and dry.  Neurological:     Mental Status: He is alert and oriented to person, place, and time.  Psychiatric:        Mood and Affect: Mood normal.     ED Results / Procedures / Treatments   Labs (all labs ordered are listed, but only abnormal results are displayed) Labs Reviewed  CBC WITH DIFFERENTIAL/PLATELET - Abnormal; Notable for the following components:      Result Value   RBC 3.06 (*)    Hemoglobin 8.4 (*)    HCT 25.7 (*)    RDW 15.8 (*)    All other components within normal limits  COMPREHENSIVE METABOLIC PANEL WITH GFR - Abnormal; Notable for the following components:   CO2 20 (*)    Glucose, Bld 282 (*)    BUN 24 (*)    Creatinine, Ser 7.64 (*)    Calcium 8.2 (*)    Albumin 3.1 (*)    GFR, Estimated 6 (*)    All other components within normal limits  BRAIN NATRIURETIC PEPTIDE    EKG EKG Interpretation Date/Time:  Saturday January 23 2024 05:38:11 EDT Ventricular Rate:  118 PR Interval:  143 QRS Duration:  109 QT Interval:  347 QTC Calculation: 487 R Axis:   -58  Text Interpretation: Sinus tachycardia Probable left atrial enlargement LAD, consider left anterior fascicular block LVH with secondary repolarization abnormality Borderline prolonged QT interval Confirmed by Donita Furrow ([id number]) on 01/23/2024 6:06:56 AM  Radiology No results found.  Procedures Procedures    Medications Ordered in ED Medications  albuterol (VENTOLIN HFA) 108 (90 Base) MCG/ACT inhaler 2 puff (has no administration in time range)    ED Course/ Medical Decision Making/ A&P Clinical Course as of 01/23/24 9147  Sat Jan 23, 2024  0624 Spoke to Dr. Jearldine Mina, nephrology.  Will plan to dialyze him today. [CH]    Clinical Course User Index [CH] Reece Mcbroom, Vonzella Guernsey, MD                                  Medical Decision Making Amount and/or Complexity of Data Reviewed Labs: ordered. Radiology: ordered.  Risk Prescription drug management.   This patient presents to the ED for concern of shortness of breath, this involves an extensive number of treatment options, and is a complaint that carries with it a high risk of complications and morbidity.  I considered the following differential and admission for this acute,  potentially life threatening condition.  The differential diagnosis includes overload, CHF, pneumonia, pneumothorax  MDM:    This is an 88 year old male who presents with shortness of breath.  Due for dialysis this morning.  He is tachypneic and in mild respiratory distress with hypoxia.  He is not on home oxygen.  Initially was placed on O2 for O2 sats in the 70s here in the emergency department.  Ultimately he was placed on BiPAP given his work of breathing and presentation.  Highly suspect volume overload given his pulmonary exam and need for dialysis this morning.  Chest x-ray reviewed at the bedside and shows bilateral pulmonary edema.  Basic lab work obtained.  Potassium is normal.  Discussed with Dr. Jearldine Mina who will arrange for patient to have dialysis today.  (Labs, imaging, consults)  Labs: I Ordered, and personally interpreted labs.  The pertinent results include: CBC, BMP  Imaging Studies ordered: I ordered imaging studies including chest x-ray I independently visualized and interpreted imaging. I agree with the radiologist interpretation  Additional history obtained from chart review.  External records from outside source obtained and reviewed including prior evaluations  Cardiac Monitoring: The patient was maintained on a cardiac monitor.  If on the cardiac monitor, I personally viewed and interpreted the cardiac monitored which showed an underlying rhythm of: NS  Reevaluation: After the interventions noted above, I reevaluated the patient and found that  they have :improved  Social Determinants of Health:  lives independently, end-stage renal disease  Disposition: Dialysis  Co morbidities that complicate the patient evaluation  Past Medical History:  Diagnosis Date   Anemia    Arthritis    ESRD (end stage renal disease) on dialysis (HCC)    T/TH/Sat   Essential hypertension    GERD (gastroesophageal reflux disease)    Hyperlipidemia    Prostate cancer (HCC)    took radiation   Type 2 diabetes mellitus (HCC)      Medicines Meds ordered this encounter  Medications   albuterol (VENTOLIN HFA) 108 (90 Base) MCG/ACT inhaler 2 puff    I have reviewed the patients home medicines and have made adjustments as needed  Problem List / ED Course: Problem List Items Addressed This Visit   None Visit Diagnoses       Acute respiratory failure with hypoxia (HCC)    -  Primary     ESRD (end stage renal disease) (HCC)                       Final Clinical Impression(s) / ED Diagnoses Final diagnoses:  Acute respiratory failure with hypoxia (HCC)  ESRD (end stage renal disease) (HCC)    Rx / DC Orders ED Discharge Orders     None         Catilyn Boggus, Vonzella Guernsey, MD 01/23/24 [phone]

## 2024-01-23 NOTE — ED Triage Notes (Signed)
 Pt from home via [facility]. Called out for shortness of breath. Pt is dialysis pt, normally [facility]. This week he went [facility] and due today (had eye surgery this week). On scene, pt was placed on oxygen with sats 88%. Improved en route, 98% on 2L with EMS. Pt 85% on 2L on arrival to ED. EDP at bedside to assess.

## 2024-01-23 NOTE — Plan of Care (Signed)

## 2024-01-24 DIAGNOSIS — R5381 Other malaise: Secondary | ICD-10-CM | POA: Diagnosis not present

## 2024-01-24 DIAGNOSIS — I10 Essential (primary) hypertension: Secondary | ICD-10-CM

## 2024-01-24 DIAGNOSIS — N186 End stage renal disease: Secondary | ICD-10-CM | POA: Diagnosis not present

## 2024-01-24 DIAGNOSIS — E1121 Type 2 diabetes mellitus with diabetic nephropathy: Secondary | ICD-10-CM | POA: Diagnosis not present

## 2024-01-24 LAB — CBC
HCT: 23.4 % — ABNORMAL LOW (ref 39.0–52.0)
Hemoglobin: 7.8 g/dL — ABNORMAL LOW (ref 13.0–17.0)
MCH: 27.2 pg (ref 26.0–34.0)
MCHC: 33.3 g/dL (ref 30.0–36.0)
MCV: 81.5 fL (ref 80.0–100.0)
Platelets: 231 10*3/uL (ref 150–400)
RBC: 2.87 MIL/uL — ABNORMAL LOW (ref 4.22–5.81)
RDW: 15.7 % — ABNORMAL HIGH (ref 11.5–15.5)
WBC: 7.3 10*3/uL (ref 4.0–10.5)
nRBC: 0 % (ref 0.0–0.2)

## 2024-01-24 LAB — GLUCOSE, CAPILLARY
Glucose-Capillary: 96 mg/dL (ref 70–99)
Glucose-Capillary: 148 mg/dL — ABNORMAL HIGH (ref 70–99)

## 2024-01-24 LAB — BASIC METABOLIC PANEL WITH GFR
Anion gap: 12 (ref 5–15)
BUN: 20 mg/dL (ref 8–23)
CO2: 27 mmol/L (ref 22–32)
Calcium: 8.4 mg/dL — ABNORMAL LOW (ref 8.9–10.3)
Chloride: 97 mmol/L — ABNORMAL LOW (ref 98–111)
Creatinine, Ser: 5.65 mg/dL — ABNORMAL HIGH (ref 0.61–1.24)
GFR, Estimated: 9 mL/min — ABNORMAL LOW (ref >60)
Glucose, Bld: 95 mg/dL (ref 70–99)
Potassium: 4 mmol/L (ref 3.5–5.1)
Sodium: 136 mmol/L (ref 135–145)

## 2024-01-24 LAB — HEPATITIS B SURFACE ANTIBODY, QUANTITATIVE: Hep B S AB Quant (Post): 75.2 m[IU]/mL

## 2024-01-24 MED ORDER — METOPROLOL TARTRATE 25 MG PO TABS: 25.0000 mg | ORAL_TABLET | Freq: Every day | ORAL | Status: DC

## 2024-01-24 NOTE — Discharge Instructions (Signed)
 IMPORTANT INFORMATION: PAY CLOSE ATTENTION   PHYSICIAN DISCHARGE INSTRUCTIONS  Follow with Primary care provider  Benetta Spar, MD  and other consultants as instructed by your Hospitalist Physician  SEEK MEDICAL CARE OR RETURN TO EMERGENCY ROOM IF SYMPTOMS COME BACK, WORSEN OR NEW PROBLEM DEVELOPS   Please note: You were cared for by a hospitalist during your hospital stay. Every effort will be made to forward records to your primary care provider.  You can request that your primary care provider send for your hospital records if they have not received them.  Once you are discharged, your primary care physician will handle any further medical issues. Please note that NO REFILLS for any discharge medications will be authorized once you are discharged, as it is imperative that you return to your primary care physician (or establish a relationship with a primary care physician if you do not have one) for your post hospital discharge needs so that they can reassess your need for medications and monitor your lab values.  Please get a complete blood count and chemistry panel checked by your Primary MD at your next visit, and again as instructed by your Primary MD.  Get Medicines reviewed and adjusted: Please take all your medications with you for your next visit with your Primary MD  Laboratory/radiological data: Please request your Primary MD to go over all hospital tests and procedure/radiological results at the follow up, please ask your primary care provider to get all Hospital records sent to his/her office.  In some cases, they will be blood work, cultures and biopsy results pending at the time of your discharge. Please request that your primary care provider follow up on these results.  If you are diabetic, please bring your blood sugar readings with you to your follow up appointment with primary care.    Please call and make your follow up appointments as soon as possible.     Also Note the following: If you experience worsening of your admission symptoms, develop shortness of breath, life threatening emergency, suicidal or homicidal thoughts you must seek medical attention immediately by calling 911 or calling your MD immediately  if symptoms less severe.  You must read complete instructions/literature along with all the possible adverse reactions/side effects for all the Medicines you take and that have been prescribed to you. Take any new Medicines after you have completely understood and accpet all the possible adverse reactions/side effects.   Do not drive when taking Pain medications or sleeping medications (Benzodiazepines)  Do not take more than prescribed Pain, Sleep and Anxiety Medications. It is not advisable to combine anxiety,sleep and pain medications without talking with your primary care practitioner  Special Instructions: If you have smoked or chewed Tobacco  in the last 2 yrs please stop smoking, stop any regular Alcohol  and or any Recreational drug use.  Wear Seat belts while driving.  Do not drive if taking any narcotic, mind altering or controlled substances or recreational drugs or alcohol.

## 2024-01-24 NOTE — Progress Notes (Signed)
   01/24/24 1318  TOC Discharge Assessment  Once discharged, how will the patient get to their discharge location? Self/Private Vehicle  Has discharge transport plan been identified? Yes  Barriers to Discharge No Barriers Identified   Patient seen by PT. Patient has no PT needs at discharge. LOC will continue to monitor patient advancement through interdisciplinary progression rounds.

## 2024-01-24 NOTE — Evaluation (Signed)
 Physical Therapy Brief Evaluation and Discharge Note Patient Details Name: Steve [name] MRN: [id number] DOB: 1936/03/10 Today's Date: 01/24/2024   History of Present Illness  a 88 y.o. male, with medical history significant for type II diabetes mellitus, hypertension, ESRD on hemodialysis TTS schedule, with recent thoracic surgery on Thursday, so she received her dialysis on Thursday and Wednesday, supposed to go for dialysis today, but patient reports shortness of breath, was found by EMS to be hypoxic saturating 88%, where he was started on 2 L oxygen and sent to ED for further evaluation, patient required BiPAP initially while in ED, he was dialyzed his full session today by renal, after dialysis session he remained on 1 L oxygen, and was noted to be significantly weak and deconditioned for which cleared hospitalist requested to admit for observation overnight, he denies any focal deficits, tingling or numbness, his workup significant for volume overload chest x-ray, hemoglobin has been stable at 8.4, [facility] hospitalist consulted to admit  Clinical Impression  Pt tolerated PT evaluation well. Pt is slow and labored with all movement but is modified independent with age appropriate balance reactions. Evaluation was completed on room air, minor drops in saturation were noted to 89% at lowest, but recovered > 95% with PLB cues. No acute skilled PT therapy services are indicated at this time. Pt is being discharged to nursing staff and mobility tech for further ambulation needs. Pt is not interested in outpatient PT interventions for age appropriate balance reactions, which are mildly impacted. PT to sign off at this time.        PT Assessment    Assistance Needed at Discharge       Equipment Recommendations None recommended by PT  Recommendations for Other Services       Precautions/Restrictions Precautions Precautions: None Restrictions Weight Bearing Restrictions Per Provider Order: No         Mobility  Bed Mobility          Transfers Overall transfer level: Modified independent Equipment used: Rolling walker (2 wheels)               General transfer comment: slow, labored, no LOB noted. Appropriate UE management to RW.    Ambulation/Gait Ambulation/Gait assistance: Modified independent (Device/Increase time) Gait Distance (Feet): 658 Feet Assistive device: Rolling walker (2 wheels) Gait Pattern/deviations: WFL(Within Functional Limits)   General Gait Details: slow, guard gait with increased UE support on RW. Appropriate turning/pivoting. SpO2 94% throughout ambulation. Desated to 89% in standing and recovered > 95% with PLB cues.  Home Activity Instructions    Stairs            Modified Rankin (Stroke Patients Only)        Balance                          Pertinent Vitals/Pain   Pain Assessment Pain Assessment: No/denies pain     Home Living   Living Arrangements: Alone       Home Equipment: Rolling Walker (2 wheels);Shower seat;Grab bars - tub/shower;Cane - single point   Additional Comments: Pt reports same as last admission, no changes.    Prior Function        UE/LE Assessment               Communication   Communication Communication: No apparent difficulties     Cognition         General Comments      Exercises  Assessment/Plan    PT Problem List         PT Visit Diagnosis Unsteadiness on feet (R26.81);Muscle weakness (generalized) (M62.81);Other abnormalities of gait and mobility (R26.89)    No Skilled PT Patient is modified independent with all activity/mobility   Co-evaluation                AMPAC 6 Clicks Help needed turning from your back to your side while in a flat bed without using bedrails?: None Help needed moving from lying on your back to sitting on the side of a flat bed without using bedrails?: None Help needed moving to and from a bed to a chair  (including a wheelchair)?: None Help needed standing up from a chair using your arms (e.g., wheelchair or bedside chair)?: None Help needed to walk in hospital room?: None Help needed climbing 3-5 steps with a railing? : None 6 Click Score: 24      End of Session Equipment Utilized During Treatment: Gait belt Activity Tolerance: Patient tolerated treatment well Patient left: in chair;with call bell/phone within reach Nurse Communication: Mobility status PT Visit Diagnosis: Unsteadiness on feet (R26.81);Muscle weakness (generalized) (M62.81);Other abnormalities of gait and mobility (R26.89)     Time: 1478-2956 PT Time Calculation (min) (ACUTE ONLY): 18 min  Charges:   PT Evaluation $PT Eval Low Complexity: 1 Low      Astrid Lay, DPT [facility] [place] [phone] office  Gatha Kaska  01/24/2024, 9:26 AM

## 2024-01-24 NOTE — Discharge Summary (Signed)
 Physician Discharge Summary  Steve [name] RUE:[id number] DOB: [birth year] DOA: 01/23/2024  PCP: Wyvonna Heidelberg, MD  Admit date: 01/23/2024 Discharge date: 01/24/2024  Admitted From:  Home  Disposition:  Home   Recommendations for Outpatient Follow-up:  Follow up with regular outpatient hemodialysis schedule Follow up with PCP in 1 week   Discharge Condition: STABLE   CODE STATUS: FULL DIET: RENAL   Brief Hospitalization Summary: Please see all hospital notes, images, labs for full details of the hospitalization. Admission provider HPI:  88 y.o. male, with medical history significant for type II diabetes mellitus, hypertension, ESRD on hemodialysis TTS schedule, with recent thoracic surgery on Thursday, so she received her dialysis on Thursday and Wednesday, supposed to go for dialysis today, but patient reports shortness of breath, was found by EMS to be hypoxic saturating 88%, where he was started on 2 L oxygen and sent to ED for further evaluation, patient required BiPAP initially while in ED, he was dialyzed his full session today by renal, after dialysis session he remained on 1 L oxygen, and was noted to be significantly weak and deconditioned for which cleared hospitalist requested to admit for observation overnight, he denies any focal deficits, tingling or numbness, his workup significant for volume overload chest x-ray, hemoglobin has been stable at 8.4, [facility] hospitalist consulted to admit   Hospital Course Patient was admitted for observation overnight after hemodialysis to be sure that he would be stable and not requiring further inpatient hemodialysis.  He received his hemodialysis treatment in the ED and 3500 mL of fluid was removed.  He is feeling much better.  We have been monitoring him in the hospital and he has remained stable.  He has been weaned to room air oxygen.  He ambulated with PT and did well with no further PT recommendations.  He is to resume his regular  outpatient hemodialysis schedule.  He is in stable condition.  He is being discharged home in stable condition.  Discharge Diagnoses:  Principal Problem:   Debility Active Problems:   HTN (hypertension)   DM (diabetes mellitus), type 2 with renal complications (HCC)   Hyperlipidemia   Type 2 diabetes mellitus with diabetic nephropathy (HCC)   Gastroesophageal reflux disease   ESRD (end stage renal disease) (HCC)  Discharge Instructions:  Allergies as of 01/24/2024   No Known Allergies      Medication List     TAKE these medications    aspirin 81 MG tablet Take 81 mg by mouth daily.   gabapentin 100 MG capsule Commonly known as: NEURONTIN Take 100 mg by mouth daily.   hydrALAZINE 50 MG tablet Commonly known as: APRESOLINE Take 50 mg by mouth in the morning and at bedtime.   linagliptin 5 MG Tabs tablet Commonly known as: TRADJENTA Take 5 mg by mouth daily.   metoprolol tartrate 25 MG tablet Commonly known as: LOPRESSOR Take 1 tablet (25 mg total) by mouth daily.   multivitamin with minerals Tabs tablet Take 1 tablet by mouth daily.   NIFEdipine 90 MG 24 hr tablet Commonly known as: PROCARDIA XL/NIFEDICAL-XL TAKE (1) TABLET BY MOUTH ONCE DAILY. What changed: See the new instructions.   pantoprazole 40 MG tablet Commonly known as: PROTONIX Take 1 tablet (40 mg total) by mouth daily before breakfast.   sulfamethoxazole-trimethoprim 800-160 MG tablet Commonly known as: BACTRIM DS Take 1 tablet by mouth 2 (two) times daily.   tamsulosin 0.4 MG Caps capsule Commonly known as: FLOMAX Take 0.4 mg by mouth  daily.        Follow-up Information     Fanta, Nancee Awe, MD Follow up.   Specialty: Internal Medicine Why: Hospital Follow Up Contact information: [address] [place] [address] [phone]         hemodialysis Follow up.   Why: as scheduled               No Known Allergies Allergies as of 01/24/2024   No Known  Allergies      Medication List     TAKE these medications    aspirin 81 MG tablet Take 81 mg by mouth daily.   gabapentin 100 MG capsule Commonly known as: NEURONTIN Take 100 mg by mouth daily.   hydrALAZINE 50 MG tablet Commonly known as: APRESOLINE Take 50 mg by mouth in the morning and at bedtime.   linagliptin 5 MG Tabs tablet Commonly known as: TRADJENTA Take 5 mg by mouth daily.   metoprolol tartrate 25 MG tablet Commonly known as: LOPRESSOR Take 1 tablet (25 mg total) by mouth daily.   multivitamin with minerals Tabs tablet Take 1 tablet by mouth daily.   NIFEdipine 90 MG 24 hr tablet Commonly known as: PROCARDIA XL/NIFEDICAL-XL TAKE (1) TABLET BY MOUTH ONCE DAILY. What changed: See the new instructions.   pantoprazole 40 MG tablet Commonly known as: PROTONIX Take 1 tablet (40 mg total) by mouth daily before breakfast.   sulfamethoxazole-trimethoprim 800-160 MG tablet Commonly known as: BACTRIM DS Take 1 tablet by mouth 2 (two) times daily.   tamsulosin 0.4 MG Caps capsule Commonly known as: FLOMAX Take 0.4 mg by mouth daily.        Procedures/Studies: DG Chest Port 1 View Result Date: 01/23/2024 CLINICAL DATA:  Hypoxia EXAM: PORTABLE CHEST 1 VIEW COMPARISON:  Chest x-ray 01/23/2024 FINDINGS: The heart is enlarged, unchanged. There is central pulmonary vascular congestion. There is a small right pleural effusion. There are some minimal patchy and strandy opacities in the right mid lung and right lung base similar to prior. There is no pneumothorax or acute fracture. IMPRESSION: 1. Stable cardiomegaly with central pulmonary vascular congestion and small right pleural effusion. 2. Stable right mid and lower lung opacities. Electronically Signed   By: Tyron Gallon M.D.   On: 01/23/2024 16:15   DG Chest Port 1 View Result Date: 01/23/2024 CLINICAL DATA:  Shortness of breath EXAM: PORTABLE CHEST 1 VIEW COMPARISON:  06/09/2023 FINDINGS: Diffuse interstitial  and airspace opacity similar to prior. Cardiomegaly and vascular pedicle widening. Small pleural effusions are likely. No pneumothorax. Extensive artifact from EKG leads. IMPRESSION: Symmetric airspace disease favoring edema. Chronic cardiomegaly. Electronically Signed   By: Ronnette Coke M.D.   On: 01/23/2024 06:32   CT Renal Stone Study Result Date: 01/04/2024 CLINICAL DATA:  Abdominal and flank pain with stone suspected. EXAM: CT ABDOMEN AND PELVIS WITHOUT CONTRAST TECHNIQUE: Multidetector CT imaging of the abdomen and pelvis was performed following the standard protocol without IV contrast. RADIATION DOSE REDUCTION: This exam was performed according to the departmental dose-optimization program which includes automated exposure control, adjustment of the mA and/or kV according to patient size and/or use of iterative reconstruction technique. COMPARISON:  05/29/2022 FINDINGS: Lower chest: Trace left and small right pleural effusions. Interstitial infiltrates in the lung bases. These are progressing since prior study possibly representing chronic fibrosis with superimposed interstitial pneumonia. Small esophageal hiatal hernia. Hepatobiliary: Cholelithiasis. No bile duct dilatation. No inflammatory changes. No focal liver lesions. Pancreas: Unremarkable. No pancreatic ductal dilatation or  surrounding inflammatory changes. Spleen: Normal in size without focal abnormality. Adrenals/Urinary Tract: No adrenal gland nodules. Bilateral renal cysts. Largest on the right measures 3.2 cm diameter. No change since prior study. No imaging follow-up is indicated. No hydronephrosis or hydroureter. No renal, ureteral, or bladder stones. Bladder wall is diffusely thickened and there is hazy infiltration in the low pelvic fat. Changes may represent postradiation change or cystitis. Correlate with history and urinalysis. Stomach/Bowel: Stomach is within normal limits. Appendix appears normal. No evidence of bowel wall  thickening, distention, or inflammatory changes. Vascular/Lymphatic: Aortic atherosclerosis. No enlarged abdominal or pelvic lymph nodes. Reproductive: Metallic structures in the prostate bed likely representing seed implants or possibly surgical clips. Other: No abdominal wall hernia or abnormality. No abdominopelvic ascites. Musculoskeletal: Degenerative changes in the spine. No destructive bone lesions. IMPRESSION: 1. No renal or ureteral stone or obstruction. 2. Diffuse bladder wall thickening with stranding in the adjacent fat likely representing radiation changes or possibly cystitis. Correlate with clinical history and urinalysis. 3. Aortic atherosclerosis. 4. Small bilateral pleural effusions with basilar interstitial changes increasing since prior study. This suggest chronic fibrosis with possible superimposed edema or interstitial pneumonia. Electronically Signed   By: Boyce Byes M.D.   On: 01/04/2024 19:57     Subjective: Pt says he feels much better after dialysis.   Discharge Exam: Vitals:   01/24/24 0446 01/24/24 0929  BP: (!) 149/68 (!) 175/69  Pulse: 86 98  Resp: 18   Temp: 99.1 F (37.3 C)   SpO2: 98%    Vitals:   01/23/24 2109 01/24/24 0036 01/24/24 0446 01/24/24 0929  BP: (!) 149/54 (!) 155/67 (!) 149/68 (!) 175/69  Pulse:  83 86 98  Resp:  18 18   Temp:  98.6 F (37 C) 99.1 F (37.3 C)   TempSrc:  Oral Oral   SpO2:  97% 98%   Weight:      Height:        General: Pt is alert, awake, not in acute distress Cardiovascular: normal S1/S2 +, no rubs, no gallops Respiratory: CTA bilaterally, no wheezing, no rhonchi Abdominal: Soft, NT, ND, bowel sounds + Extremities: trace pretibial edema, no cyanosis   The results of significant diagnostics from this hospitalization (including imaging, microbiology, ancillary and laboratory) are listed below for reference.     Microbiology: No results found for this or any previous visit (from the past 240 hours).    Labs: BNP (last 3 results) Recent Labs    01/23/24 0530  BNP 734.0*   Basic Metabolic Panel: Recent Labs  Lab 01/23/24 0530 01/23/24 1957 01/24/24 0438  NA 136 135 136  K 3.6 4.1 4.0  CL 101 93* 97*  CO2 20* 28 27  GLUCOSE 282* 101* 95  BUN 24* 14 20  CREATININE 7.64* 4.81* 5.65*  CALCIUM 8.2* 8.8* 8.4*  PHOS  --  3.3  --    Liver Function Tests: Recent Labs  Lab 01/23/24 0530 01/23/24 1957  AST 27  --   ALT 13  --   ALKPHOS 70  --   BILITOT 0.7  --   PROT 7.2  --   ALBUMIN 3.1* 3.1*   No results for input(s): "LIPASE", "AMYLASE" in the last 168 hours. No results for input(s): "AMMONIA" in the last 168 hours. CBC: Recent Labs  Lab 01/23/24 0530 01/24/24 0438  WBC 8.7 7.3  NEUTROABS 6.6  --   HGB 8.4* 7.8*  HCT 25.7* 23.4*  MCV 84.0 81.5  PLT 222 231  Cardiac Enzymes: No results for input(s): "CKTOTAL", "CKMB", "CKMBINDEX", "TROPONINI" in the last 168 hours. BNP: Invalid input(s): "POCBNP" CBG: Recent Labs  Lab 01/23/24 2053 01/24/24 0738 01/24/24 1136  GLUCAP 135* 96 148*   D-Dimer No results for input(s): "DDIMER" in the last 72 hours. Hgb A1c No results for input(s): "HGBA1C" in the last 72 hours. Lipid Profile No results for input(s): "CHOL", "HDL", "LDLCALC", "TRIG", "CHOLHDL", "LDLDIRECT" in the last 72 hours. Thyroid function studies No results for input(s): "TSH", "T4TOTAL", "T3FREE", "THYROIDAB" in the last 72 hours.  Invalid input(s): "FREET3" Anemia work up No results for input(s): "VITAMINB12", "FOLATE", "FERRITIN", "TIBC", "IRON", "RETICCTPCT" in the last 72 hours. Urinalysis    Component Value Date/Time   COLORURINE YELLOW 01/04/2024 1515   APPEARANCEUR TURBID (A) 01/04/2024 1515   APPEARANCEUR Clear 07/09/2021 1453   LABSPEC 1.009 01/04/2024 1515   PHURINE 8.0 01/04/2024 1515   GLUCOSEU NEGATIVE 01/04/2024 1515   HGBUR NEGATIVE 01/04/2024 1515   BILIRUBINUR NEGATIVE 01/04/2024 1515   BILIRUBINUR Negative 07/09/2021  1453   KETONESUR NEGATIVE 01/04/2024 1515   PROTEINUR 100 (A) 01/04/2024 1515   UROBILINOGEN negative (A) 02/14/2020 0951   NITRITE NEGATIVE 01/04/2024 1515   LEUKOCYTESUR LARGE (A) 01/04/2024 1515   Sepsis Labs Recent Labs  Lab 01/23/24 0530 01/24/24 0438  WBC 8.7 7.3   Microbiology No results found for this or any previous visit (from the past 240 hours).  Time coordinating discharge: 28 mins  SIGNED:  Faustino Hook, MD  [facility] 01/24/2024, 11:43 AM How to contact the [facility] Attending or Consulting provider 7A - 7P or covering provider during after hours 7P -7A, for this patient?  Check the care team in [facility] and look for a) attending/consulting [facility] provider listed and b) the [facility] team listed Log into [email or web address] and use [facility] universal password to access. If you do not have the password, please contact the hospital operator. Locate the [facility] provider you are looking for under [facility] and page to a number that you can be directly reached. If you still have difficulty reaching the provider, please page the [facility] (Director on Call) for the Hospitalists listed on amion for assistance.

## 2024-01-24 NOTE — Progress Notes (Signed)
   01/24/24 0716  TOC Brief Assessment  Insurance and Status Reviewed  Patient has primary care physician Yes  Home environment has been reviewed Apartment  Prior level of function: Independent  Readmission risk has been reviewed Yes  Transition of care needs no transition of care needs at this time   Transition of Care Department [facility]) has reviewed patient and no TOC needs have been identified at this time. We will continue to monitor patient advancement through interdisciplinary progression rounds. If new patient transition needs arise, please place a TOC consult

## 2024-01-24 NOTE — Care Management Obs Status (Signed)
 MEDICARE OBSERVATION STATUS NOTIFICATION   Patient Details  Name: Steve [name] MRN: [id number] Date of Birth: Apr 06, 1936   Medicare Observation Status Notification Given:   Yes.    Lynda Sands, RN 01/24/2024, 12:09 PM

## 2024-01-26 DIAGNOSIS — N2581 Secondary hyperparathyroidism of renal origin: Secondary | ICD-10-CM | POA: Diagnosis not present

## 2024-01-26 DIAGNOSIS — N186 End stage renal disease: Secondary | ICD-10-CM | POA: Diagnosis not present

## 2024-01-26 DIAGNOSIS — Z992 Dependence on renal dialysis: Secondary | ICD-10-CM | POA: Diagnosis not present

## 2024-01-28 DIAGNOSIS — N186 End stage renal disease: Secondary | ICD-10-CM | POA: Diagnosis not present

## 2024-01-28 DIAGNOSIS — Z992 Dependence on renal dialysis: Secondary | ICD-10-CM | POA: Diagnosis not present

## 2024-01-28 DIAGNOSIS — N2581 Secondary hyperparathyroidism of renal origin: Secondary | ICD-10-CM | POA: Diagnosis not present

## 2024-01-30 DIAGNOSIS — N2581 Secondary hyperparathyroidism of renal origin: Secondary | ICD-10-CM | POA: Diagnosis not present

## 2024-01-30 DIAGNOSIS — N186 End stage renal disease: Secondary | ICD-10-CM | POA: Diagnosis not present

## 2024-01-30 DIAGNOSIS — Z992 Dependence on renal dialysis: Secondary | ICD-10-CM | POA: Diagnosis not present

## 2024-02-02 DIAGNOSIS — N186 End stage renal disease: Secondary | ICD-10-CM | POA: Diagnosis not present

## 2024-02-02 DIAGNOSIS — Z992 Dependence on renal dialysis: Secondary | ICD-10-CM | POA: Diagnosis not present

## 2024-02-02 DIAGNOSIS — N2581 Secondary hyperparathyroidism of renal origin: Secondary | ICD-10-CM | POA: Diagnosis not present

## 2024-02-03 DIAGNOSIS — Z992 Dependence on renal dialysis: Secondary | ICD-10-CM | POA: Diagnosis not present

## 2024-02-03 DIAGNOSIS — R54 Age-related physical debility: Secondary | ICD-10-CM | POA: Diagnosis not present

## 2024-02-03 DIAGNOSIS — E8770 Fluid overload, unspecified: Secondary | ICD-10-CM | POA: Diagnosis not present

## 2024-02-03 DIAGNOSIS — E1142 Type 2 diabetes mellitus with diabetic polyneuropathy: Secondary | ICD-10-CM | POA: Diagnosis not present

## 2024-02-03 DIAGNOSIS — I10 Essential (primary) hypertension: Secondary | ICD-10-CM | POA: Diagnosis not present

## 2024-02-04 DIAGNOSIS — N2581 Secondary hyperparathyroidism of renal origin: Secondary | ICD-10-CM | POA: Diagnosis not present

## 2024-02-04 DIAGNOSIS — Z992 Dependence on renal dialysis: Secondary | ICD-10-CM | POA: Diagnosis not present

## 2024-02-04 DIAGNOSIS — N186 End stage renal disease: Secondary | ICD-10-CM | POA: Diagnosis not present

## 2024-02-06 DIAGNOSIS — N2581 Secondary hyperparathyroidism of renal origin: Secondary | ICD-10-CM | POA: Diagnosis not present

## 2024-02-06 DIAGNOSIS — Z992 Dependence on renal dialysis: Secondary | ICD-10-CM | POA: Diagnosis not present

## 2024-02-06 DIAGNOSIS — N186 End stage renal disease: Secondary | ICD-10-CM | POA: Diagnosis not present

## 2024-02-09 DIAGNOSIS — Z992 Dependence on renal dialysis: Secondary | ICD-10-CM | POA: Diagnosis not present

## 2024-02-09 DIAGNOSIS — N2581 Secondary hyperparathyroidism of renal origin: Secondary | ICD-10-CM | POA: Diagnosis not present

## 2024-02-09 DIAGNOSIS — N186 End stage renal disease: Secondary | ICD-10-CM | POA: Diagnosis not present

## 2024-02-10 DIAGNOSIS — N186 End stage renal disease: Secondary | ICD-10-CM | POA: Diagnosis not present

## 2024-02-10 DIAGNOSIS — Z992 Dependence on renal dialysis: Secondary | ICD-10-CM | POA: Diagnosis not present

## 2024-02-11 DIAGNOSIS — N2581 Secondary hyperparathyroidism of renal origin: Secondary | ICD-10-CM | POA: Diagnosis not present

## 2024-02-11 DIAGNOSIS — Z992 Dependence on renal dialysis: Secondary | ICD-10-CM | POA: Diagnosis not present

## 2024-02-11 DIAGNOSIS — N186 End stage renal disease: Secondary | ICD-10-CM | POA: Diagnosis not present

## 2024-02-13 DIAGNOSIS — N2581 Secondary hyperparathyroidism of renal origin: Secondary | ICD-10-CM | POA: Diagnosis not present

## 2024-02-13 DIAGNOSIS — Z992 Dependence on renal dialysis: Secondary | ICD-10-CM | POA: Diagnosis not present

## 2024-02-13 DIAGNOSIS — N186 End stage renal disease: Secondary | ICD-10-CM | POA: Diagnosis not present

## 2024-02-16 DIAGNOSIS — N2581 Secondary hyperparathyroidism of renal origin: Secondary | ICD-10-CM | POA: Diagnosis not present

## 2024-02-16 DIAGNOSIS — Z992 Dependence on renal dialysis: Secondary | ICD-10-CM | POA: Diagnosis not present

## 2024-02-16 DIAGNOSIS — N186 End stage renal disease: Secondary | ICD-10-CM | POA: Diagnosis not present

## 2024-02-18 DIAGNOSIS — N186 End stage renal disease: Secondary | ICD-10-CM | POA: Diagnosis not present

## 2024-02-18 DIAGNOSIS — Z992 Dependence on renal dialysis: Secondary | ICD-10-CM | POA: Diagnosis not present

## 2024-02-18 DIAGNOSIS — N2581 Secondary hyperparathyroidism of renal origin: Secondary | ICD-10-CM | POA: Diagnosis not present

## 2024-02-20 DIAGNOSIS — Z992 Dependence on renal dialysis: Secondary | ICD-10-CM | POA: Diagnosis not present

## 2024-02-20 DIAGNOSIS — N2581 Secondary hyperparathyroidism of renal origin: Secondary | ICD-10-CM | POA: Diagnosis not present

## 2024-02-20 DIAGNOSIS — N186 End stage renal disease: Secondary | ICD-10-CM | POA: Diagnosis not present

## 2024-02-23 DIAGNOSIS — N2581 Secondary hyperparathyroidism of renal origin: Secondary | ICD-10-CM | POA: Diagnosis not present

## 2024-02-23 DIAGNOSIS — N186 End stage renal disease: Secondary | ICD-10-CM | POA: Diagnosis not present

## 2024-02-23 DIAGNOSIS — Z992 Dependence on renal dialysis: Secondary | ICD-10-CM | POA: Diagnosis not present

## 2024-02-25 DIAGNOSIS — N186 End stage renal disease: Secondary | ICD-10-CM | POA: Diagnosis not present

## 2024-02-25 DIAGNOSIS — N2581 Secondary hyperparathyroidism of renal origin: Secondary | ICD-10-CM | POA: Diagnosis not present

## 2024-02-25 DIAGNOSIS — Z992 Dependence on renal dialysis: Secondary | ICD-10-CM | POA: Diagnosis not present

## 2024-02-27 DIAGNOSIS — N186 End stage renal disease: Secondary | ICD-10-CM | POA: Diagnosis not present

## 2024-02-27 DIAGNOSIS — Z992 Dependence on renal dialysis: Secondary | ICD-10-CM | POA: Diagnosis not present

## 2024-02-27 DIAGNOSIS — N2581 Secondary hyperparathyroidism of renal origin: Secondary | ICD-10-CM | POA: Diagnosis not present

## 2024-03-01 DIAGNOSIS — N2581 Secondary hyperparathyroidism of renal origin: Secondary | ICD-10-CM | POA: Diagnosis not present

## 2024-03-01 DIAGNOSIS — N186 End stage renal disease: Secondary | ICD-10-CM | POA: Diagnosis not present

## 2024-03-01 DIAGNOSIS — Z992 Dependence on renal dialysis: Secondary | ICD-10-CM | POA: Diagnosis not present

## 2024-03-03 DIAGNOSIS — N2581 Secondary hyperparathyroidism of renal origin: Secondary | ICD-10-CM | POA: Diagnosis not present

## 2024-03-03 DIAGNOSIS — N186 End stage renal disease: Secondary | ICD-10-CM | POA: Diagnosis not present

## 2024-03-03 DIAGNOSIS — Z992 Dependence on renal dialysis: Secondary | ICD-10-CM | POA: Diagnosis not present

## 2024-03-05 DIAGNOSIS — N2581 Secondary hyperparathyroidism of renal origin: Secondary | ICD-10-CM | POA: Diagnosis not present

## 2024-03-05 DIAGNOSIS — Z992 Dependence on renal dialysis: Secondary | ICD-10-CM | POA: Diagnosis not present

## 2024-03-05 DIAGNOSIS — N186 End stage renal disease: Secondary | ICD-10-CM | POA: Diagnosis not present

## 2024-03-08 DIAGNOSIS — N186 End stage renal disease: Secondary | ICD-10-CM | POA: Diagnosis not present

## 2024-03-08 DIAGNOSIS — N2581 Secondary hyperparathyroidism of renal origin: Secondary | ICD-10-CM | POA: Diagnosis not present

## 2024-03-08 DIAGNOSIS — Z992 Dependence on renal dialysis: Secondary | ICD-10-CM | POA: Diagnosis not present

## 2024-03-09 ENCOUNTER — Encounter: Payer: Self-pay | Admitting: Gastroenterology

## 2024-03-10 DIAGNOSIS — N2581 Secondary hyperparathyroidism of renal origin: Secondary | ICD-10-CM | POA: Diagnosis not present

## 2024-03-10 DIAGNOSIS — N186 End stage renal disease: Secondary | ICD-10-CM | POA: Diagnosis not present

## 2024-03-10 DIAGNOSIS — Z992 Dependence on renal dialysis: Secondary | ICD-10-CM | POA: Diagnosis not present

## 2024-03-12 DIAGNOSIS — N2581 Secondary hyperparathyroidism of renal origin: Secondary | ICD-10-CM | POA: Diagnosis not present

## 2024-03-12 DIAGNOSIS — Z992 Dependence on renal dialysis: Secondary | ICD-10-CM | POA: Diagnosis not present

## 2024-03-12 DIAGNOSIS — N186 End stage renal disease: Secondary | ICD-10-CM | POA: Diagnosis not present

## 2024-03-15 DIAGNOSIS — Z992 Dependence on renal dialysis: Secondary | ICD-10-CM | POA: Diagnosis not present

## 2024-03-15 DIAGNOSIS — N2581 Secondary hyperparathyroidism of renal origin: Secondary | ICD-10-CM | POA: Diagnosis not present

## 2024-03-15 DIAGNOSIS — Z23 Encounter for immunization: Secondary | ICD-10-CM | POA: Diagnosis not present

## 2024-03-15 DIAGNOSIS — N186 End stage renal disease: Secondary | ICD-10-CM | POA: Diagnosis not present

## 2024-03-17 DIAGNOSIS — Z992 Dependence on renal dialysis: Secondary | ICD-10-CM | POA: Diagnosis not present

## 2024-03-17 DIAGNOSIS — N2581 Secondary hyperparathyroidism of renal origin: Secondary | ICD-10-CM | POA: Diagnosis not present

## 2024-03-17 DIAGNOSIS — N186 End stage renal disease: Secondary | ICD-10-CM | POA: Diagnosis not present

## 2024-03-17 DIAGNOSIS — Z23 Encounter for immunization: Secondary | ICD-10-CM | POA: Diagnosis not present

## 2024-03-19 DIAGNOSIS — Z23 Encounter for immunization: Secondary | ICD-10-CM | POA: Diagnosis not present

## 2024-03-19 DIAGNOSIS — N186 End stage renal disease: Secondary | ICD-10-CM | POA: Diagnosis not present

## 2024-03-19 DIAGNOSIS — N2581 Secondary hyperparathyroidism of renal origin: Secondary | ICD-10-CM | POA: Diagnosis not present

## 2024-03-19 DIAGNOSIS — Z992 Dependence on renal dialysis: Secondary | ICD-10-CM | POA: Diagnosis not present

## 2024-03-21 DIAGNOSIS — Z992 Dependence on renal dialysis: Secondary | ICD-10-CM | POA: Diagnosis not present

## 2024-03-21 DIAGNOSIS — E1142 Type 2 diabetes mellitus with diabetic polyneuropathy: Secondary | ICD-10-CM | POA: Diagnosis not present

## 2024-03-21 DIAGNOSIS — E785 Hyperlipidemia, unspecified: Secondary | ICD-10-CM | POA: Diagnosis not present

## 2024-03-21 DIAGNOSIS — I10 Essential (primary) hypertension: Secondary | ICD-10-CM | POA: Diagnosis not present

## 2024-03-21 DIAGNOSIS — M1990 Unspecified osteoarthritis, unspecified site: Secondary | ICD-10-CM | POA: Diagnosis not present

## 2024-03-22 DIAGNOSIS — Z23 Encounter for immunization: Secondary | ICD-10-CM | POA: Diagnosis not present

## 2024-03-22 DIAGNOSIS — N186 End stage renal disease: Secondary | ICD-10-CM | POA: Diagnosis not present

## 2024-03-22 DIAGNOSIS — N2581 Secondary hyperparathyroidism of renal origin: Secondary | ICD-10-CM | POA: Diagnosis not present

## 2024-03-22 DIAGNOSIS — Z992 Dependence on renal dialysis: Secondary | ICD-10-CM | POA: Diagnosis not present

## 2024-03-24 DIAGNOSIS — Z23 Encounter for immunization: Secondary | ICD-10-CM | POA: Diagnosis not present

## 2024-03-24 DIAGNOSIS — N2581 Secondary hyperparathyroidism of renal origin: Secondary | ICD-10-CM | POA: Diagnosis not present

## 2024-03-24 DIAGNOSIS — N186 End stage renal disease: Secondary | ICD-10-CM | POA: Diagnosis not present

## 2024-03-24 DIAGNOSIS — Z992 Dependence on renal dialysis: Secondary | ICD-10-CM | POA: Diagnosis not present

## 2024-03-26 DIAGNOSIS — N186 End stage renal disease: Secondary | ICD-10-CM | POA: Diagnosis not present

## 2024-03-26 DIAGNOSIS — Z23 Encounter for immunization: Secondary | ICD-10-CM | POA: Diagnosis not present

## 2024-03-26 DIAGNOSIS — Z992 Dependence on renal dialysis: Secondary | ICD-10-CM | POA: Diagnosis not present

## 2024-03-26 DIAGNOSIS — N2581 Secondary hyperparathyroidism of renal origin: Secondary | ICD-10-CM | POA: Diagnosis not present

## 2024-03-29 ENCOUNTER — Observation Stay (HOSPITAL_COMMUNITY)
Admission: EM | Admit: 2024-03-29 | Discharge: 2024-03-30 | Disposition: A | Discharge status: Home / Self Care | Attending: Internal Medicine | Admitting: Internal Medicine

## 2024-03-29 ENCOUNTER — Emergency Department (HOSPITAL_COMMUNITY)

## 2024-03-29 ENCOUNTER — Other Ambulatory Visit: Payer: Self-pay

## 2024-03-29 ENCOUNTER — Encounter (HOSPITAL_COMMUNITY): Payer: Self-pay | Admitting: *Deleted

## 2024-03-29 DIAGNOSIS — E8770 Fluid overload, unspecified: Secondary | ICD-10-CM | POA: Diagnosis not present

## 2024-03-29 DIAGNOSIS — N186 End stage renal disease: Secondary | ICD-10-CM | POA: Insufficient documentation

## 2024-03-29 DIAGNOSIS — E213 Hyperparathyroidism, unspecified: Secondary | ICD-10-CM | POA: Diagnosis not present

## 2024-03-29 DIAGNOSIS — I10 Essential (primary) hypertension: Secondary | ICD-10-CM | POA: Diagnosis present

## 2024-03-29 DIAGNOSIS — R7989 Other specified abnormal findings of blood chemistry: Secondary | ICD-10-CM | POA: Diagnosis not present

## 2024-03-29 DIAGNOSIS — J9601 Acute respiratory failure with hypoxia: Secondary | ICD-10-CM | POA: Diagnosis not present

## 2024-03-29 DIAGNOSIS — J810 Acute pulmonary edema: Secondary | ICD-10-CM | POA: Diagnosis not present

## 2024-03-29 DIAGNOSIS — Z992 Dependence on renal dialysis: Secondary | ICD-10-CM | POA: Insufficient documentation

## 2024-03-29 DIAGNOSIS — E1169 Type 2 diabetes mellitus with other specified complication: Secondary | ICD-10-CM | POA: Diagnosis present

## 2024-03-29 DIAGNOSIS — C61 Malignant neoplasm of prostate: Secondary | ICD-10-CM | POA: Diagnosis present

## 2024-03-29 DIAGNOSIS — I160 Hypertensive urgency: Principal | ICD-10-CM | POA: Insufficient documentation

## 2024-03-29 DIAGNOSIS — R778 Other specified abnormalities of plasma proteins: Secondary | ICD-10-CM | POA: Diagnosis not present

## 2024-03-29 DIAGNOSIS — R0902 Hypoxemia: Principal | ICD-10-CM

## 2024-03-29 DIAGNOSIS — Z8546 Personal history of malignant neoplasm of prostate: Secondary | ICD-10-CM | POA: Diagnosis not present

## 2024-03-29 DIAGNOSIS — E785 Hyperlipidemia, unspecified: Secondary | ICD-10-CM | POA: Diagnosis not present

## 2024-03-29 DIAGNOSIS — Z7982 Long term (current) use of aspirin: Secondary | ICD-10-CM | POA: Insufficient documentation

## 2024-03-29 DIAGNOSIS — Z79899 Other long term (current) drug therapy: Secondary | ICD-10-CM | POA: Insufficient documentation

## 2024-03-29 DIAGNOSIS — E1122 Type 2 diabetes mellitus with diabetic chronic kidney disease: Secondary | ICD-10-CM | POA: Insufficient documentation

## 2024-03-29 DIAGNOSIS — Z87891 Personal history of nicotine dependence: Secondary | ICD-10-CM | POA: Insufficient documentation

## 2024-03-29 DIAGNOSIS — R918 Other nonspecific abnormal finding of lung field: Secondary | ICD-10-CM | POA: Diagnosis not present

## 2024-03-29 DIAGNOSIS — R0600 Dyspnea, unspecified: Secondary | ICD-10-CM | POA: Diagnosis not present

## 2024-03-29 DIAGNOSIS — R0603 Acute respiratory distress: Secondary | ICD-10-CM | POA: Diagnosis not present

## 2024-03-29 DIAGNOSIS — I120 Hypertensive chronic kidney disease with stage 5 chronic kidney disease or end stage renal disease: Secondary | ICD-10-CM | POA: Insufficient documentation

## 2024-03-29 DIAGNOSIS — R069 Unspecified abnormalities of breathing: Secondary | ICD-10-CM | POA: Diagnosis not present

## 2024-03-29 DIAGNOSIS — D631 Anemia in chronic kidney disease: Secondary | ICD-10-CM | POA: Diagnosis not present

## 2024-03-29 DIAGNOSIS — N2581 Secondary hyperparathyroidism of renal origin: Secondary | ICD-10-CM | POA: Diagnosis not present

## 2024-03-29 DIAGNOSIS — R0602 Shortness of breath: Secondary | ICD-10-CM | POA: Diagnosis not present

## 2024-03-29 DIAGNOSIS — I700 Atherosclerosis of aorta: Secondary | ICD-10-CM | POA: Diagnosis not present

## 2024-03-29 LAB — COMPREHENSIVE METABOLIC PANEL WITH GFR
ALT: 10 U/L (ref 0–44)
AST: 19 U/L (ref 15–41)
Albumin: 3.4 g/dL — ABNORMAL LOW (ref 3.5–5.0)
Alkaline Phosphatase: 83 U/L (ref 38–126)
Anion gap: 17 — ABNORMAL HIGH (ref 5–15)
BUN: 32 mg/dL — ABNORMAL HIGH (ref 8–23)
CO2: 21 mmol/L — ABNORMAL LOW (ref 22–32)
Calcium: 8.9 mg/dL (ref 8.9–10.3)
Chloride: 102 mmol/L (ref 98–111)
Creatinine, Ser: 7.99 mg/dL — ABNORMAL HIGH (ref 0.61–1.24)
GFR, Estimated: 6 mL/min — ABNORMAL LOW (ref >60)
Glucose, Bld: 194 mg/dL — ABNORMAL HIGH (ref 70–99)
Potassium: 4.2 mmol/L (ref 3.5–5.1)
Sodium: 140 mmol/L (ref 135–145)
Total Bilirubin: 0.6 mg/dL (ref 0.0–1.2)
Total Protein: 8.4 g/dL — ABNORMAL HIGH (ref 6.5–8.1)

## 2024-03-29 LAB — CBC WITH DIFFERENTIAL/PLATELET
Abs Immature Granulocytes: 0.07 10*3/uL (ref 0.00–0.07)
Basophils Absolute: 0 10*3/uL (ref 0.0–0.1)
Basophils Relative: 0 %
Eosinophils Absolute: 0 10*3/uL (ref 0.0–0.5)
Eosinophils Relative: 0 %
HCT: 34.1 % — ABNORMAL LOW (ref 39.0–52.0)
Hemoglobin: 11.5 g/dL — ABNORMAL LOW (ref 13.0–17.0)
Immature Granulocytes: 1 %
Lymphocytes Relative: 5 %
Lymphs Abs: 0.6 10*3/uL — ABNORMAL LOW (ref 0.7–4.0)
MCH: 27.5 pg (ref 26.0–34.0)
MCHC: 33.7 g/dL (ref 30.0–36.0)
MCV: 81.6 fL (ref 80.0–100.0)
Monocytes Absolute: 0.7 10*3/uL (ref 0.1–1.0)
Monocytes Relative: 6 %
Neutro Abs: 10.5 10*3/uL — ABNORMAL HIGH (ref 1.7–7.7)
Neutrophils Relative %: 88 %
Platelets: 240 10*3/uL (ref 150–400)
RBC: 4.18 MIL/uL — ABNORMAL LOW (ref 4.22–5.81)
RDW: 15.6 % — ABNORMAL HIGH (ref 11.5–15.5)
WBC: 11.9 10*3/uL — ABNORMAL HIGH (ref 4.0–10.5)
nRBC: 0 % (ref 0.0–0.2)

## 2024-03-29 LAB — TROPONIN I (HIGH SENSITIVITY)
Troponin I (High Sensitivity): 30 ng/L — ABNORMAL HIGH (ref <18)
Troponin I (High Sensitivity): 37 ng/L — ABNORMAL HIGH (ref <18)

## 2024-03-29 LAB — FOLATE: Folate: 12.7 ng/mL (ref >5.9)

## 2024-03-29 LAB — HEMOGLOBIN A1C
Hgb A1c MFr Bld: 5.1 % (ref 4.8–5.6)
Mean Plasma Glucose: 99.67 mg/dL

## 2024-03-29 LAB — PROCALCITONIN: Procalcitonin: 0.11 ng/mL

## 2024-03-29 LAB — HEPATITIS B SURFACE ANTIGEN: Hepatitis B Surface Ag: NONREACTIVE

## 2024-03-29 LAB — GLUCOSE, CAPILLARY: Glucose-Capillary: 98 mg/dL (ref 70–99)

## 2024-03-29 LAB — T4, FREE: Free T4: 1.04 ng/dL (ref 0.61–1.12)

## 2024-03-29 LAB — TSH: TSH: 2.537 u[IU]/mL (ref 0.350–4.500)

## 2024-03-29 LAB — BRAIN NATRIURETIC PEPTIDE: B Natriuretic Peptide: 1471 pg/mL — ABNORMAL HIGH (ref 0.0–100.0)

## 2024-03-29 LAB — MRSA NEXT GEN BY PCR, NASAL: MRSA by PCR Next Gen: NOT DETECTED

## 2024-03-29 LAB — VITAMIN B12: Vitamin B-12: 168 pg/mL — ABNORMAL LOW (ref 180–914)

## 2024-03-29 MED ORDER — ACETAMINOPHEN 325 MG PO TABS: 650.0000 mg | ORAL_TABLET | Freq: Four times a day (QID) | ORAL | Status: DC | PRN

## 2024-03-29 MED ORDER — ONDANSETRON HCL 4 MG PO TABS: 4.0000 mg | ORAL_TABLET | Freq: Four times a day (QID) | ORAL | Status: DC | PRN

## 2024-03-29 MED ORDER — TAMSULOSIN HCL 0.4 MG PO CAPS
0.4000 mg | ORAL_CAPSULE | Freq: Every day | ORAL | Status: DC
  Administered 2024-03-29 – 2024-03-30 (×2): 0.4 mg via ORAL
  Filled 2024-03-29 (×2): qty 1

## 2024-03-29 MED ORDER — NIFEDIPINE ER OSMOTIC RELEASE 30 MG PO TB24
90.0000 mg | ORAL_TABLET | Freq: Every day | ORAL | Status: DC
  Administered 2024-03-29 – 2024-03-30 (×2): 90 mg via ORAL
  Filled 2024-03-29 (×2): qty 3

## 2024-03-29 MED ORDER — ACETAMINOPHEN 650 MG RE SUPP: 650.0000 mg | Freq: Four times a day (QID) | RECTAL | Status: DC | PRN

## 2024-03-29 MED ORDER — PANTOPRAZOLE SODIUM 40 MG PO TBEC
40.0000 mg | DELAYED_RELEASE_TABLET | Freq: Every day | ORAL | Status: DC
  Administered 2024-03-30: 40 mg via ORAL
  Filled 2024-03-29: qty 1

## 2024-03-29 MED ORDER — ASPIRIN 81 MG PO CHEW
81.0000 mg | CHEWABLE_TABLET | Freq: Every day | ORAL | Status: DC
  Administered 2024-03-29 – 2024-03-30 (×2): 81 mg via ORAL
  Filled 2024-03-29 (×2): qty 1

## 2024-03-29 MED ORDER — FUROSEMIDE 10 MG/ML IJ SOLN
40.0000 mg | Freq: Once | INTRAMUSCULAR | Status: AC
  Administered 2024-03-29: 40 mg via INTRAVENOUS
  Filled 2024-03-29: qty 4

## 2024-03-29 MED ORDER — ONDANSETRON HCL 4 MG/2ML IJ SOLN: 4.0000 mg | Freq: Four times a day (QID) | INTRAMUSCULAR | Status: DC | PRN

## 2024-03-29 MED ORDER — HEPARIN SODIUM (PORCINE) 1000 UNIT/ML DIALYSIS: 2000.0000 [IU] | Freq: Once | INTRAMUSCULAR | Status: DC

## 2024-03-29 MED ORDER — GABAPENTIN 100 MG PO CAPS
100.0000 mg | ORAL_CAPSULE | Freq: Every day | ORAL | Status: DC
  Administered 2024-03-29 – 2024-03-30 (×2): 100 mg via ORAL
  Filled 2024-03-29 (×2): qty 1

## 2024-03-29 MED ORDER — CHLORHEXIDINE GLUCONATE CLOTH 2 % EX PADS
6.0000 | MEDICATED_PAD | Freq: Every day | CUTANEOUS | Status: DC
  Administered 2024-03-29 – 2024-03-30 (×2): 6 via TOPICAL

## 2024-03-29 MED ORDER — NITROGLYCERIN 0.4 MG SL SUBL
0.4000 mg | SUBLINGUAL_TABLET | Freq: Once | SUBLINGUAL | Status: AC | PRN
  Administered 2024-03-29: 0.4 mg via SUBLINGUAL
  Filled 2024-03-29: qty 1

## 2024-03-29 MED ORDER — METOPROLOL SUCCINATE ER 50 MG PO TB24
50.0000 mg | ORAL_TABLET | Freq: Every day | ORAL | Status: DC
  Administered 2024-03-29 – 2024-03-30 (×2): 50 mg via ORAL
  Filled 2024-03-29 (×2): qty 1

## 2024-03-29 MED ORDER — PENTAFLUOROPROP-TETRAFLUOROETH EX AERO: 1.0000 | INHALATION_SPRAY | CUTANEOUS | Status: DC | PRN

## 2024-03-29 MED ORDER — LIDOCAINE HCL (PF) 1 % IJ SOLN: 5.0000 mL | INTRAMUSCULAR | Status: DC | PRN

## 2024-03-29 MED ORDER — CALCITRIOL 0.25 MCG PO CAPS
0.7500 ug | ORAL_CAPSULE | ORAL | Status: DC
  Administered 2024-03-30: 0.75 ug via ORAL
  Filled 2024-03-29: qty 3

## 2024-03-29 MED ORDER — LIDOCAINE-PRILOCAINE 2.5-2.5 % EX CREA: 1.0000 | TOPICAL_CREAM | CUTANEOUS | Status: DC | PRN

## 2024-03-29 MED ORDER — HYDRALAZINE HCL 50 MG PO TABS
50.0000 mg | ORAL_TABLET | Freq: Two times a day (BID) | ORAL | Status: DC
  Administered 2024-03-29 – 2024-03-30 (×3): 50 mg via ORAL
  Filled 2024-03-29 (×3): qty 1

## 2024-03-29 MED ORDER — CLEVIDIPINE BUTYRATE 0.5 MG/ML IV EMUL
0.0000 mg/h | INTRAVENOUS | Status: DC
  Administered 2024-03-29: 8 mg/h via INTRAVENOUS
  Administered 2024-03-29: 0 mg/h via INTRAVENOUS
  Administered 2024-03-29: 2 mg/h via INTRAVENOUS
  Filled 2024-03-29: qty 50

## 2024-03-29 NOTE — ED Provider Notes (Signed)
 [place] [facility]GENCY DEPARTMENT AT [facility] Provider Note  CSN: [id number] Arrival date & time: 03/29/24 8295  Chief Complaint(s) Respiratory Distress  HPI Steve [name] is a 88 y.o. male with PMH ESRD on hemodialysis Tuesday Thursday Saturday, T2DM, GERD, HLD who presents emergency room for evaluation of shortness of breath.  Patient states he has been compliant with his dialysis and woke up this morning to go to dialysis but felt very short of breath.  Found to be hypoxic with EMS with sats in the 80s with accessory muscle use.  Placed on CPAP and brought to the emergency room for further evaluation.  Here in the ER, he endorses shortness of breath but denies chest pain, abdominal pain, nausea, vomiting, diaphoresis or other systemic symptoms.   Past Medical History Past Medical History:  Diagnosis Date   Anemia    Arthritis    ESRD (end stage renal disease) on dialysis (HCC)    T/TH/Sat   Essential hypertension    GERD (gastroesophageal reflux disease)    Hyperlipidemia    Prostate cancer (HCC)    took radiation   Type 2 diabetes mellitus (HCC)    Patient Active Problem List   Diagnosis Date Noted   Hypertensive urgency 03/29/2024   Debility 01/23/2024   ESRD (end stage renal disease) (HCC) 01/23/2024   Dysphagia 09/22/2022   Hiatal hernia 09/22/2022   Status post cervical discectomy 08/23/2020   Cervical myelopathy (HCC) 08/23/2020   Atypical chest pain 04/18/2020   Acute kidney injury superimposed on CKD (HCC) 04/18/2020   Gastroesophageal reflux disease    Fall at home, initial encounter 07/26/2018   Acute left-sided weakness 07/25/2018   Hyperlipidemia 07/25/2018   CKD (chronic kidney disease), stage IV (HCC) 05/13/2018   Chronic kidney disease, stage 4 (severe) (HCC) 05/13/2018   Chest pain 05/12/2018   Elevated troponin 07/02/2017   DM (diabetes mellitus), type 2 with renal complications (HCC) 07/02/2017   IBS (irritable bowel syndrome) 01/28/2017    Prostate cancer (HCC) 07/10/2014   Abnormal ECG 04/12/2013   HTN (hypertension) 04/12/2013   SOB (shortness of breath) 04/12/2013   Type 2 diabetes mellitus with diabetic nephropathy (HCC) 01/11/2013   CHEST PAIN-UNSPECIFIED 06/12/2009   Home Medication(s) Prior to Admission medications   Medication Sig Start Date End Date Taking? Authorizing Provider  aspirin  81 MG tablet Take 81 mg by mouth daily.    [provider]  gabapentin  (NEURONTIN ) 100 MG capsule Take 100 mg by mouth daily.    [provider]  hydrALAZINE  (APRESOLINE ) 50 MG tablet Take 50 mg by mouth in the morning and at bedtime.    [provider]  linagliptin  (TRADJENTA ) 5 MG TABS tablet Take 5 mg by mouth daily.    [provider]  metoprolol  tartrate (LOPRESSOR ) 25 MG tablet Take 1 tablet (25 mg total) by mouth daily. 01/24/24   Rayfield Cairo, MD  Multiple Vitamin (MULTIVITAMIN WITH MINERALS) TABS tablet Take 1 tablet by mouth daily.    [provider]  NIFEdipine  (PROCARDIA  XL/ADALAT -CC) 90 MG 24 hr tablet TAKE (1) TABLET BY MOUTH ONCE DAILY. Patient taking differently: Take 90 mg by mouth daily before breakfast. 06/17/18   Flavia Hughs, MD  pantoprazole  (PROTONIX ) 40 MG tablet Take 1 tablet (40 mg total) by mouth daily before breakfast. 12/31/22   Lanney Pitts, PA-C  sulfamethoxazole-trimethoprim (BACTRIM DS) 800-160 MG tablet Take 1 tablet by mouth 2 (two) times daily. 01/18/24   [provider]  tamsulosin  (FLOMAX )  0.4 MG CAPS capsule Take 0.4 mg by mouth daily. 09/02/22   [provider]                                                                                                                                    Past Surgical History Past Surgical History:  Procedure Laterality Date   ANTERIOR CERVICAL DECOMP/DISCECTOMY FUSION N/A 08/23/2020   Procedure: Cervical 3-4 Anterior cervical decompression/discectomy/fusion;  Surgeon: Manya Sells,  MD;  Location: [facility] OR;  Service: Neurosurgery;  Laterality: N/A;  3C/RM 21 to follow   AV FISTULA PLACEMENT Left 02/21/2021   Procedure: INSERTION OF ARTERIOVENOUS GORE-TEX GRAFT LEFT ARM;  Surgeon: Mayo Speck, MD;  Location: [facility] ORS;  Service: Vascular;  Laterality: Left;   BIOPSY  10/22/2022   Procedure: BIOPSY;  Surgeon: Suzette Espy, MD;  Location: [facility] ENDO SUITE;  Service: Endoscopy;;   CATARACT EXTRACTION W/PHACO Right 04/28/2016   Procedure: CATARACT EXTRACTION PHACO AND INTRAOCULAR LENS PLACEMENT (IOC);  Surgeon: Clay Cummins, MD;  Location: [facility] ORS;  Service: Ophthalmology;  Laterality: Right;  CDE: 4.28   COLONOSCOPY     about 2 years in [place] around 2016   CYSTECTOMY     pt. denies   ESOPHAGOGASTRODUODENOSCOPY (EGD) WITH PROPOFOL  N/A 10/22/2022   Procedure: ESOPHAGOGASTRODUODENOSCOPY (EGD) WITH PROPOFOL ;  Surgeon: Suzette Espy, MD;  Location: [facility];  Service: Endoscopy;  Laterality: N/A;  12:30 pm, pt know to arrive at 9:15, LM to see if pt can come earlier   IR FLUORO GUIDE CV LINE LEFT  12/20/2020   IR US  GUIDE VASC ACCESS LEFT  12/20/2020   MALONEY DILATION N/A 10/22/2022   Procedure: Londa Rival DILATION;  Surgeon: Suzette Espy, MD;  Location: [facility];  Service: Endoscopy;  Laterality: N/A;   Family History Family History  Problem Relation Age of Onset   Heart disease Mother        cause of death   Diabetes Sister    Colon cancer Neg Hx     Social History Social History   Tobacco Use   Smoking status: Former    Current packs/day: 0.00    Average packs/day: 2.0 packs/day for 32.9 years (65.9 ttl pk-yrs)    Types: Pipe, Cigars, Cigarettes    Start date: 11/08/1954    Quit date: 10/14/1987    Years since quitting: 36.4   Smokeless tobacco: Never   Tobacco comments:    About 2 pipes a day  Vaping Use   Vaping status: Never Used  Substance Use Topics   Alcohol  use: No    Alcohol /week: 0.0 standard drinks of alcohol    Drug use: No    Allergies Patient has no known allergies.  Review of Systems Review of Systems  Respiratory:  Positive for shortness of breath.     Physical Exam Vital Signs  I have reviewed the triage vital signs BP (!) 203/78  Pulse 74   Temp 98.2 F (36.8 C) (Oral)   Resp (!) 29   Ht 5' 5 (1.651 m)   Wt 73.5 kg   SpO2 96%   BMI 26.96 kg/m   Physical Exam Vitals and nursing note reviewed.  Constitutional:      General: He is not in acute distress.    Appearance: He is well-developed.  HENT:     Head: Normocephalic and atraumatic.   Eyes:     Conjunctiva/sclera: Conjunctivae normal.    Cardiovascular:     Rate and Rhythm: Normal rate and regular rhythm.     Heart sounds: No murmur heard. Pulmonary:     Effort: Respiratory distress present.     Breath sounds: Rales present.  Abdominal:     Palpations: Abdomen is soft.     Tenderness: There is no abdominal tenderness.   Musculoskeletal:        General: No swelling.     Cervical back: Neck supple.   Skin:    General: Skin is warm and dry.     Capillary Refill: Capillary refill takes less than 2 seconds.   Neurological:     Mental Status: He is alert.   Psychiatric:        Mood and Affect: Mood normal.     ED Results and Treatments Labs (all labs ordered are listed, but only abnormal results are displayed) Labs Reviewed  COMPREHENSIVE METABOLIC PANEL WITH GFR - Abnormal; Notable for the following components:      Result Value   CO2 21 (*)    Glucose, Bld 194 (*)    BUN 32 (*)    Creatinine, Ser 7.99 (*)    Total Protein 8.4 (*)    Albumin 3.4 (*)    GFR, Estimated 6 (*)    Anion gap 17 (*)    All other components within normal limits  CBC WITH DIFFERENTIAL/PLATELET - Abnormal; Notable for the following components:   WBC 11.9 (*)    RBC 4.18 (*)    Hemoglobin 11.5 (*)    HCT 34.1 (*)    RDW 15.6 (*)    Neutro Abs 10.5 (*)    Lymphs Abs 0.6 (*)    All other components within normal limits  BRAIN  NATRIURETIC PEPTIDE - Abnormal; Notable for the following components:   B Natriuretic Peptide 1,471.0 (*)    All other components within normal limits  TROPONIN I (HIGH SENSITIVITY) - Abnormal; Notable for the following components:   Troponin I (High Sensitivity) 30 (*)    All other components within normal limits  TROPONIN I (HIGH SENSITIVITY)                                                                                                                          Radiology DG Chest Portable 1 View Result Date: 03/29/2024 CLINICAL DATA:  88 year old male with shortness of breath. EXAM: PORTABLE CHEST 1 VIEW COMPARISON:  Portable chest 01/23/2024 and earlier.  FINDINGS: Portable AP upright view at 0718 hours. Similar lung volumes. Stable heart size at the upper limits of normal. Calcified aortic atherosclerosis. Other mediastinal contours are within normal limits. Progressed bilateral coarse and indistinct pulmonary interstitial opacity, fairly symmetric. Indistinct diaphragms. No pneumothorax. No consolidation. Visualized tracheal air column is within normal limits. No acute osseous abnormality identified. Negative visible bowel gas. IMPRESSION: 1. Progressed bilateral pulmonary interstitial opacity since April favored to be Acute Pulmonary Edema. Viral/atypical respiratory infection felt less likely. 2. Aortic Atherosclerosis (ICD10-I70.0). Electronically Signed   By: Marlise Simpers M.D.   On: 03/29/2024 07:38    Pertinent labs & imaging results that were available during my care of the patient were reviewed by me and considered in my medical decision making (see MDM for details).  Medications Ordered in ED Medications  nitroGLYCERIN  (NITROSTAT ) SL tablet 0.4 mg (has no administration in time range)  furosemide  (LASIX ) injection 40 mg (40 mg Intravenous Given 03/29/24 0848)                                                                                                                                      Procedures .Critical Care  Performed by: Karlyn Overman, MD Authorized by: Karlyn Overman, MD   Critical care provider statement:    Critical care time (minutes):  30   Critical care was necessary to treat or prevent imminent or life-threatening deterioration of the following conditions:  Respiratory failure   Critical care was time spent personally by me on the following activities:  Development of treatment plan with patient or surrogate, discussions with consultants, evaluation of patient's response to treatment, examination of patient, ordering and review of laboratory studies, ordering and review of radiographic studies, ordering and performing treatments and interventions, pulse oximetry, re-evaluation of patient's condition and review of old charts   (including critical care time)  Medical Decision Making / ED Course   This patient presents to the ED for concern of shortness of breath, this involves an extensive number of treatment options, and is a complaint that carries with it a high risk of complications and morbidity.  The differential diagnosis includes Pe, PTX, Pulmonary Edema, ARDS, COPD/Asthma, ACS, CHF exacerbation, Arrhythmia, Pericardial Effusion/Tamponade, Anemia, Sepsis, Acidosis/Hypercapnia, Anxiety, Viral URI  MDM: Patient seen emergency room for evaluation of shortness of breath.  Physical exam with tachypnea and rales with mild accessory muscle use but is otherwise unremarkable.  Laboratory evaluation with a leukocytosis to 11.9, hemoglobin 11.5, BUN 32, creatinine 7.99 consistent with his history of ESRD but potassium is normal at 4.2, BNP significant elevated at 1471, high-sensitivity troponin is 30 likely decreased renal clearance and type II demand ischemia.  Chest x-ray showing worsening pulmonary edema.  Patient does make urine and 40 of Lasix  given.  0.4 mg sublingual nitroglycerin  given as systolics are greater than 200 here in the ER and pt had improvement of  blood pressures .  Spoke with the nephrologist on-call Dr. Austine Blunt who will help arrange inpatient dialysis.  Patient initially arrived on CPAP and was able to be transition to 4 L nasal cannula to maintain oxygen  saturations.  Will require hospital admission for fluid overload and hypoxia.  Additional history obtained:  -External records from outside source obtained and reviewed including: Chart review including previous notes, labs, imaging, consultation notes   Lab Tests: -I ordered, reviewed, and interpreted labs.   The pertinent results include:   Labs Reviewed  COMPREHENSIVE METABOLIC PANEL WITH GFR - Abnormal; Notable for the following components:      Result Value   CO2 21 (*)    Glucose, Bld 194 (*)    BUN 32 (*)    Creatinine, Ser 7.99 (*)    Total Protein 8.4 (*)    Albumin 3.4 (*)    GFR, Estimated 6 (*)    Anion gap 17 (*)    All other components within normal limits  CBC WITH DIFFERENTIAL/PLATELET - Abnormal; Notable for the following components:   WBC 11.9 (*)    RBC 4.18 (*)    Hemoglobin 11.5 (*)    HCT 34.1 (*)    RDW 15.6 (*)    Neutro Abs 10.5 (*)    Lymphs Abs 0.6 (*)    All other components within normal limits  BRAIN NATRIURETIC PEPTIDE - Abnormal; Notable for the following components:   B Natriuretic Peptide 1,471.0 (*)    All other components within normal limits  TROPONIN I (HIGH SENSITIVITY) - Abnormal; Notable for the following components:   Troponin I (High Sensitivity) 30 (*)    All other components within normal limits  TROPONIN I (HIGH SENSITIVITY)      EKG   EKG Interpretation Date/Time:  Tuesday March 29 2024 07:12:15 EDT Ventricular Rate:  90 PR Interval:  196 QRS Duration:  104 QT Interval:  398 QTC Calculation: 487 R Axis:   -59  Text Interpretation: Sinus rhythm Consider left atrial enlargement LAD, consider left anterior fascicular block Confirmed by Najiyah Paris ([id number]) on 03/29/2024 9:14:56 AM         Imaging Studies  ordered: I ordered imaging studies including chest x-ray I independently visualized and interpreted imaging. I agree with the radiologist interpretation   Medicines ordered and prescription drug management: Meds ordered this encounter  Medications   nitroGLYCERIN  (NITROSTAT ) SL tablet 0.4 mg   furosemide  (LASIX ) injection 40 mg    -I have reviewed the patients home medicines and have made adjustments as needed  Critical interventions Supplemental oxygen , Lasix , nephrology consultation  Consultations Obtained: I requested consultation with the nephrologist on-call,  and discussed lab and imaging findings as well as pertinent plan - they recommend: Inpatient dialysis   Cardiac Monitoring: The patient was maintained on a cardiac monitor.  I personally viewed and interpreted the cardiac monitored which showed an underlying rhythm of: NSR  Social Determinants of Health:  Factors impacting patients care include: none   Reevaluation: After the interventions noted above, I reevaluated the patient and found that they have :improved  Co morbidities that complicate the patient evaluation  Past Medical History:  Diagnosis Date   Anemia    Arthritis    ESRD (end stage renal disease) on dialysis (HCC)    T/TH/Sat   Essential hypertension    GERD (gastroesophageal reflux disease)    Hyperlipidemia    Prostate cancer (HCC)    took radiation   Type 2 diabetes mellitus (HCC)  Dispostion: I considered admission for this patient, and patient require hospital admission for hypoxia and fluid overload with need for emergent dialysis     Final Clinical Impression(s) / ED Diagnoses Final diagnoses:  None     @PCDICTATION @    Karlyn Overman, MD 03/29/24 [phone]

## 2024-03-29 NOTE — Plan of Care (Signed)

## 2024-03-29 NOTE — Hospital Course (Addendum)
 Steve [name] was admitted to the hospital with volume overload and uncontrolled hypertension.   88 year old male with a history of ESRD (TTS) hypertension, diabetes mellitus type 2, hyperlipidemia, GERD, prostate cancer presenting with shortness of breath that began around 2 AM on 03/29/2024.  Patient endorses compliance with his dialysis. Because of severe dyspnea he called EMS, he was found hypoxemic with oxygen  saturation in the 80s.  He was placed on CPAP and was transported to the ED.  On his initial physical examination his blood pressure was 214/79, HR 88, RR 18 and 02 saturation 96% on supplemental 02 per [facility] with rales bilaterally with no wheezing or rhonchi, heart with S1 and S2 present and regular with no gallops or rubs, abdomen with no distention and positive + lower extremity edema.   Na 140, K 4,2 CL 102 bicarbonate 21, glucose 194 bun 32 cr 7,99 AST 19 ALT 10  BNP 1,471 High sensitive troponin 30 and 37  Wbc 11,9 hgb 11,5 plt 240   Chest radiograph with no cardiomegaly, bilateral diffuse interstitial infiltrates, predominant at the lower lobes with small right pleural eaffusion.   EKG 90 bpm, left axis deviation, normal intervals, qtc 487, sinus rhythm with J point elevation V1 to V3, no significant T wave changes.   Patient was placed on clevidipine drip for blood pressure control and underwent emergent renal replacement therapy with ultrafiltration.   06/18 patient off clevidipine infusion, blood pressure and volume status have improved after hemodialysis.  Plan to resume outpatient renal replacement therapy as outpatient.

## 2024-03-29 NOTE — Consult Note (Signed)
 Reason for Consult:ESRD Referring Physician: Dr. Jeannie Milo  Chief Complaint: Shortness of breath  Outpatient HD orders: [facility] TTS.  3.5 hours.  EDW 74  kg.  AVG LUE.  Flow rates: 400/500.  2K/2.5 calcium.  Heparin : 1000 units loading bolus, 700 units/hr.  Calcitriol  0.75 mcg with treatment.  Venofer  50 mg (due today), Cinacalcet  30mg , Mircera 75 (2 weeks due today)  Assessment/Plan:  ESRD  -HD today on TTS schedule; not sure if I believe the listed weight which is actually below his new dry weight listed as 74 kg.  I will gently challenge him as tolerated and hopefully get a floor scale postdialysis.   Additional recommendations - Dose all meds for creatinine clearance < 10 ml/min  - Unless absolutely necessary, no MRIs with gadolinium.  - Prefer needle sticks in the dorsum of the hands or wrists.  No blood pressure measurements in right arm. - If blood transfusion is requested during hemodialysis sessions, please alert us  prior to the session.   Avoid nephrotoxic medications including NSAIDs and iodinated intravenous contrast exposure unless the latter is absolutely indicated.  Preferred narcotic agents for pain control are hydromorphone , fentanyl , and methadone. Morphine  should not be used. Avoid Baclofen and avoid oral sodium phosphate  and magnesium citrate based laxatives / bowel preps. Continue strict Input and Output monitoring. Will monitor the patient closely with you and intervene or adjust therapy as indicated by changes in clinical status/labs    SOB -pulm edema and will UF as tolerated   HTN/Volume -UF as tolerated, resume home anti-HTNs - On cleviprex gtt which can hopefully be titrated off soon. UF will help as well.   Anemia of Chronic Kidney Disease -Hemoglobin 11.5 (due for Mircera today but will hold for now)  -Transfuse PRN for Hgb <7   Secondary Hyperparathyroidism/Hyperphosphatemia - will resume calcitriol  -resume home binders if on any    HPI: Steve [name] is an 88 y.o. male with a past medical history of ESRD, HTN, HLD, DM2 who presents with SOB. He receives  HD TTS at [facility] with last HD on Sat; normally does not miss dialysis treatments and does not sign off early either.  Of note his dialysis unit has been trying to challenge his dry weight which he is tolerating.  Recently decreased dry weight by 1 kg, his EDW is listed as 74 kg.  He is presenting with shortness of breath which woke him up this morning; he states that he was fine yesterday.  Patient denies any chest pain, orthopnea although he uses a few pillows at night which is no change from the past.  He was found to be hypoxic with saturations in the 80s by EMS with accessory muscle use, placed on CPAP and brought to the emergency room for evaluation.  He is saturating at 95% with 4 L nasal cannula.  Chest ray in the emergency department showed increased bilateral interstitial markings consistent with pulmonary edema; of note this was compared with April 2025 and worse this admission.  Blood pressure was also noted to be 203/78, started on Cleviprex drip. K4.2 HCO3 21, BUN/Cr 32/7.99, Hb 11.5 and BNP 1471.      ROS Pertinent items are noted in HPI.  Chemistry and CBC: Creatinine, Ser  Date/Time Value Ref Range Status  03/29/2024 07:34 AM 7.99 (H) 0.61 - 1.24 mg/dL Final  13/05/6577 46:96 AM 5.65 (H) 0.61 - 1.24 mg/dL Final  29/52/8413 24:40 PM 4.81 (H) 0.61 - 1.24 mg/dL Final  08/09/2535 64:40 AM  7.64 (H) 0.61 - 1.24 mg/dL Final  09/81/1914 78:29 PM 7.34 (H) 0.61 - 1.24 mg/dL Final  56/21/3086 57:84 AM 6.26 (H) 0.61 - 1.24 mg/dL Final  69/62/9528 41:32 AM 5.27 (H) 0.61 - 1.24 mg/dL Final  44/10/270 53:66 AM 5.90 (H) 0.61 - 1.24 mg/dL Final  44/12/4740 59:56 PM 5.57 (H) 0.61 - 1.24 mg/dL Final  38/75/6433 29:51 AM 4.20 (H) 0.61 - 1.24 mg/dL Final  88/41/6606 30:16 PM 6.32 (H) 0.61 - 1.24 mg/dL Final  10/21/3233 57:32 PM 6.65 (H) 0.61 - 1.24 mg/dL Final  20/25/4270 62:37  PM 5.19 (H) 0.61 - 1.24 mg/dL Final  62/83/1517 61:60 PM 5.40 (H) 0.61 - 1.24 mg/dL Final  73/71/0626 94:85 PM 5.76 (H) 0.61 - 1.24 mg/dL Final  46/27/0350 09:38 PM 5.71 (H) 0.61 - 1.24 mg/dL Final  18/29/9371 69:67 PM 5.98 (H) 0.61 - 1.24 mg/dL Final  89/38/1017 51:02 AM 4.43 (H) 0.61 - 1.24 mg/dL Final  58/52/7782 42:35 PM 4.56 (H) 0.61 - 1.24 mg/dL Final  36/14/4315 40:08 AM 3.83 (H) 0.61 - 1.24 mg/dL Final  67/61/9509 32:67 AM 4.03 (H) 0.61 - 1.24 mg/dL Final  12/45/8099 83:38 PM 3.78 (H) 0.61 - 1.24 mg/dL Final  25/02/3975 73:41 PM 3.16 (H) 0.61 - 1.24 mg/dL Final  93/79/0240 97:35 PM 2.67 (H) 0.61 - 1.24 mg/dL Final  32/99/2426 83:41 PM 3.00 (H) 0.61 - 1.24 mg/dL Final  96/22/2979 89:21 PM 2.74 (H) 0.61 - 1.24 mg/dL Final  19/41/7408 14:48 PM 2.78 (H) 0.61 - 1.24 mg/dL Final  18/56/3149 70:26 PM 2.60 (H) 0.61 - 1.24 mg/dL Final  37/85/8850 27:74 AM 2.78 (H) 0.61 - 1.24 mg/dL Final  12/87/8676 72:09 AM 3.01 (H) 0.61 - 1.24 mg/dL Final  47/06/6282 66:29 AM 2.50 (H) 0.61 - 1.24 mg/dL Final  47/65/4650 35:46 AM 2.32 (H) 0.61 - 1.24 mg/dL Final  56/81/2751 70:01 PM 2.26 (H) 0.61 - 1.24 mg/dL Final  74/94/4967 59:16 PM 2.04 (H) 0.61 - 1.24 mg/dL Final  38/46/6599 35:70 AM 2.23 (H) 0.61 - 1.24 mg/dL Final  17/79/3903 00:92 AM 2.59 (H) 0.61 - 1.24 mg/dL Final  33/00/7622 63:33 AM 2.13 (H) 0.61 - 1.24 mg/dL Final  54/56/2563 89:37 PM 2.41 (H) 0.50 - 1.35 mg/dL Final   Recent Labs  Lab 03/29/24 0734  NA 140  K 4.2  CL 102  CO2 21*  GLUCOSE 194*  BUN 32*  CREATININE 7.99*  CALCIUM 8.9   Recent Labs  Lab 03/29/24 0734  WBC 11.9*  NEUTROABS 10.5*  HGB 11.5*  HCT 34.1*  MCV 81.6  PLT 240   Liver Function Tests: Recent Labs  Lab 03/29/24 0734  AST 19  ALT 10  ALKPHOS 83  BILITOT 0.6  PROT 8.4*  ALBUMIN 3.4*   No results for input(s): LIPASE, AMYLASE in the last 168 hours. No results for input(s): AMMONIA in the last 168 hours. Cardiac Enzymes: No results  for input(s): CKTOTAL, CKMB, CKMBINDEX, TROPONINI in the last 168 hours. Iron  Studies: No results for input(s): IRON , TIBC, TRANSFERRIN, FERRITIN in the last 72 hours. PT/INR: @LABRCNTIP (inr:5)  Xrays/Other Studies: ) Results for orders placed or performed during the hospital encounter of 03/29/24 (from the past 48 hours)  Comprehensive metabolic panel     Status: Abnormal   Collection Time: 03/29/24  7:34 AM  Result Value Ref Range   Sodium 140 135 - 145 mmol/L   Potassium 4.2 3.5 - 5.1 mmol/L   Chloride 102 98 - 111 mmol/L   CO2 21 (L) 22 -  32 mmol/L   Glucose, Bld 194 (H) 70 - 99 mg/dL    Comment: Glucose reference range applies only to samples taken after fasting for at least 8 hours.   BUN 32 (H) 8 - 23 mg/dL   Creatinine, Ser 1.61 (H) 0.61 - 1.24 mg/dL   Calcium 8.9 8.9 - 09.6 mg/dL   Total Protein 8.4 (H) 6.5 - 8.1 g/dL   Albumin 3.4 (L) 3.5 - 5.0 g/dL   AST 19 15 - 41 U/L   ALT 10 0 - 44 U/L   Alkaline Phosphatase 83 38 - 126 U/L   Total Bilirubin 0.6 0.0 - 1.2 mg/dL   GFR, Estimated 6 (L) >60 mL/min    Comment: (NOTE) Calculated using the CKD-EPI Creatinine Equation (2021)    Anion gap 17 (H) 5 - 15    Comment: Performed at [facility], [address], [place], [place] [address]  Troponin I (High Sensitivity)     Status: Abnormal   Collection Time: 03/29/24  7:34 AM  Result Value Ref Range   Troponin I (High Sensitivity) 30 (H) <18 ng/L    Comment: (NOTE) Elevated high sensitivity troponin I (hsTnI) values and significant  changes across serial measurements may suggest ACS but many other  chronic and acute conditions are known to elevate hsTnI results.  Refer to the Links section for chest pain algorithms and additional  guidance. Performed at [facility], [address] [place], [place] [address]   CBC with Differential     Status: Abnormal   Collection Time: 03/29/24  7:34 AM  Result Value Ref Range   WBC 11.9 (H) 4.0 - 10.5 K/uL   RBC  4.18 (L) 4.22 - 5.81 MIL/uL   Hemoglobin 11.5 (L) 13.0 - 17.0 g/dL   HCT 14.7 (L) 82.9 - 56.2 %   MCV 81.6 80.0 - 100.0 fL   MCH 27.5 26.0 - 34.0 pg   MCHC 33.7 30.0 - 36.0 g/dL   RDW 13.0 (H) 86.5 - 78.4 %   Platelets 240 150 - 400 K/uL   nRBC 0.0 0.0 - 0.2 %   Neutrophils Relative % 88 %   Neutro Abs 10.5 (H) 1.7 - 7.7 K/uL   Lymphocytes Relative 5 %   Lymphs Abs 0.6 (L) 0.7 - 4.0 K/uL   Monocytes Relative 6 %   Monocytes Absolute 0.7 0.1 - 1.0 K/uL   Eosinophils Relative 0 %   Eosinophils Absolute 0.0 0.0 - 0.5 K/uL   Basophils Relative 0 %   Basophils Absolute 0.0 0.0 - 0.1 K/uL   Immature Granulocytes 1 %   Abs Immature Granulocytes 0.07 0.00 - 0.07 K/uL    Comment: Performed at [facility], [address], [place], [place] [address]  Brain natriuretic peptide     Status: Abnormal   Collection Time: 03/29/24  7:34 AM  Result Value Ref Range   B Natriuretic Peptide 1,471.0 (H) 0.0 - 100.0 pg/mL    Comment: Performed at [facility], [address], [place], [place] [address]   DG Chest Portable 1 View Result Date: 03/29/2024 CLINICAL DATA:  88 year old male with shortness of breath. EXAM: PORTABLE CHEST 1 VIEW COMPARISON:  Portable chest 01/23/2024 and earlier. FINDINGS: Portable AP upright view at 0718 hours. Similar lung volumes. Stable heart size at the upper limits of normal. Calcified aortic atherosclerosis. Other mediastinal contours are within normal limits. Progressed bilateral coarse and indistinct pulmonary interstitial opacity, fairly symmetric. Indistinct diaphragms. No pneumothorax. No consolidation. Visualized tracheal air column  is within normal limits. No acute osseous abnormality identified. Negative visible bowel gas. IMPRESSION: 1. Progressed bilateral pulmonary interstitial opacity since April favored to be Acute Pulmonary Edema. Viral/atypical respiratory infection felt less likely. 2. Aortic Atherosclerosis (ICD10-I70.0). Electronically Signed   By: Marlise Simpers M.D.    On: 03/29/2024 07:38    PMH:   Past Medical History:  Diagnosis Date   Anemia    Arthritis    ESRD (end stage renal disease) on dialysis (HCC)    T/TH/Sat   Essential hypertension    GERD (gastroesophageal reflux disease)    Hyperlipidemia    Prostate cancer (HCC)    took radiation   Type 2 diabetes mellitus (HCC)     PSH:   Past Surgical History:  Procedure Laterality Date   ANTERIOR CERVICAL DECOMP/DISCECTOMY FUSION N/A 08/23/2020   Procedure: Cervical 3-4 Anterior cervical decompression/discectomy/fusion;  Surgeon: Manya Sells, MD;  Location: [facility] OR;  Service: Neurosurgery;  Laterality: N/A;  3C/RM 21 to follow   AV FISTULA PLACEMENT Left 02/21/2021   Procedure: INSERTION OF ARTERIOVENOUS GORE-TEX GRAFT LEFT ARM;  Surgeon: Mayo Speck, MD;  Location: [facility] ORS;  Service: Vascular;  Laterality: Left;   BIOPSY  10/22/2022   Procedure: BIOPSY;  Surgeon: Suzette Espy, MD;  Location: [facility] ENDO SUITE;  Service: Endoscopy;;   CATARACT EXTRACTION W/PHACO Right 04/28/2016   Procedure: CATARACT EXTRACTION PHACO AND INTRAOCULAR LENS PLACEMENT (IOC);  Surgeon: Clay Cummins, MD;  Location: [facility] ORS;  Service: Ophthalmology;  Laterality: Right;  CDE: 4.28   COLONOSCOPY     about 2 years in [place] around 2016   CYSTECTOMY     pt. denies   ESOPHAGOGASTRODUODENOSCOPY (EGD) WITH PROPOFOL  N/A 10/22/2022   Procedure: ESOPHAGOGASTRODUODENOSCOPY (EGD) WITH PROPOFOL ;  Surgeon: Suzette Espy, MD;  Location: [facility];  Service: Endoscopy;  Laterality: N/A;  12:30 pm, pt know to arrive at 9:15, LM to see if pt can come earlier   IR FLUORO GUIDE CV LINE LEFT  12/20/2020   IR US  GUIDE VASC ACCESS LEFT  12/20/2020   MALONEY DILATION N/A 10/22/2022   Procedure: Londa Rival DILATION;  Surgeon: Suzette Espy, MD;  Location: [facility];  Service: Endoscopy;  Laterality: N/A;    Allergies: No Known Allergies  Medications:   Prior to Admission medications   Medication Sig Start Date End Date  Taking? Authorizing Provider  aspirin  81 MG tablet Take 81 mg by mouth daily.    [provider]  gabapentin  (NEURONTIN ) 100 MG capsule Take 100 mg by mouth daily.    [provider]  hydrALAZINE  (APRESOLINE ) 50 MG tablet Take 50 mg by mouth in the morning and at bedtime.    [provider]  linagliptin  (TRADJENTA ) 5 MG TABS tablet Take 5 mg by mouth daily.    [provider]  metoprolol  tartrate (LOPRESSOR ) 25 MG tablet Take 1 tablet (25 mg total) by mouth daily. 01/24/24   Rayfield Cairo, MD  Multiple Vitamin (MULTIVITAMIN WITH MINERALS) TABS tablet Take 1 tablet by mouth daily.    [provider]  NIFEdipine  (PROCARDIA  XL/ADALAT -CC) 90 MG 24 hr tablet TAKE (1) TABLET BY MOUTH ONCE DAILY. Patient taking differently: Take 90 mg by mouth daily before breakfast. 06/17/18   Flavia Hughs, MD  pantoprazole  (PROTONIX ) 40 MG tablet Take 1 tablet (40 mg total) by mouth daily before breakfast. 12/31/22   Lanney Pitts, PA-C  sulfamethoxazole-trimethoprim (BACTRIM DS) 800-160 MG tablet Take 1 tablet by mouth  2 (two) times daily. 01/18/24   [provider]  tamsulosin  (FLOMAX ) 0.4 MG CAPS capsule Take 0.4 mg by mouth daily. 09/02/22   [provider]    Discontinued Meds:  There are no discontinued medications.  Social History:  reports that he quit smoking about 36 years ago. His smoking use included pipe, cigars, and cigarettes. He started smoking about 69 years ago. He has a 65.9 pack-year smoking history. He has never used smokeless tobacco. He reports that he does not drink alcohol  and does not use drugs.  Family History:   Family History  Problem Relation Age of Onset   Heart disease Mother        cause of death   Diabetes Sister    Colon cancer Neg Hx     Blood pressure (!) 203/78, pulse 74, temperature 98.2 F (36.8 C), temperature source Oral, resp. rate (!) 29, height 5' 5 (1.651 m), weight 73.5 kg, SpO2  96%. General: well-appearing, no acute distress HEENT: anicteric sclera, MMM CV: normal rate, no murmurs, no edema Lungs: rales at bases Abd: soft, non-tender, non-distended Skin: no visible lesions or rashes Ext: trace ankle edema b/l Neuro: normal speech, no gross focal deficits  Dialysis access: Lt FAL (AT) +b/t       Patrick Boor, MD 03/29/2024, 9:19 AM

## 2024-03-29 NOTE — ED Triage Notes (Signed)
 Pt states he started having some sob last night before he went to bed but this am when he got up to go to dialysis his sob became worse.  Pt came in by EMS with cpap  Pt denies any pain

## 2024-03-29 NOTE — H&P (Signed)
 History and Physical    Patient: Steve [name] UEA:[id number] DOB: 09-09-1936 DOA: 03/29/2024 DOS: the patient was seen and examined on 03/29/2024 PCP: Wyvonna Heidelberg, MD  Patient coming from: Home  Chief Complaint:  Chief Complaint  Patient presents with   Respiratory Distress   HPI: Steve [name] is a 88 year old male with a history of ESRD (TTS) hypertension, diabetes mellitus type 2, hyperlipidemia, GERD, prostate cancer presenting with shortness of breath that began around 2 AM on 03/29/2024.  Patient endorses compliance with his dialysis.  His last dialysis session was on 03/26/2024.  He has not been cutting his session short.  He states that he was recently started on a new blood pressure medicine because high blood pressure.  He does not know the name .  He endorses compliance with all his medications.  He has had some subjective chills.  He denies any headache, neck pain, chest pain, coughing, hemoptysis, nausea, vomiting, diarrhea, abdominal pain. Upon EMS arrival, the patient was noted to have oxygen  saturation in the 80s.  He was placed on CPAP. In the ED, the patient was afebrile hemodynamically stable.  His blood pressure was up to 214/79.  Oxygen  saturation was 96% on 4 L.  WBC 11.9, hemoglobin 11.5, platelet 240.  Sodium 140, potassium 4.2, bicarbonate 21.  AST 19, ALT 10, alk phosphatase 83, total bilirubin 0.6.  BNP 1471.  Troponin 30.  Chest x-ray showed pulmonary edema.  Nephrology was contacted since.  Review of Systems: As mentioned in the history of present illness. All other systems reviewed and are negative. Past Medical History:  Diagnosis Date   Anemia    Arthritis    ESRD (end stage renal disease) on dialysis [facility])    T/TH/Sat   Essential hypertension    GERD (gastroesophageal reflux disease)    Hyperlipidemia    Prostate cancer (HCC)    took radiation   Type 2 diabetes mellitus (HCC)    Past Surgical History:  Procedure Laterality Date   ANTERIOR  CERVICAL DECOMP/DISCECTOMY FUSION N/A 08/23/2020   Procedure: Cervical 3-4 Anterior cervical decompression/discectomy/fusion;  Surgeon: Manya Sells, MD;  Location: [facility] OR;  Service: Neurosurgery;  Laterality: N/A;  3C/RM 21 to follow   AV FISTULA PLACEMENT Left 02/21/2021   Procedure: INSERTION OF ARTERIOVENOUS GORE-TEX GRAFT LEFT ARM;  Surgeon: Mayo Speck, MD;  Location: [facility] ORS;  Service: Vascular;  Laterality: Left;   BIOPSY  10/22/2022   Procedure: BIOPSY;  Surgeon: Suzette Espy, MD;  Location: [facility];  Service: Endoscopy;;   CATARACT EXTRACTION W/PHACO Right 04/28/2016   Procedure: CATARACT EXTRACTION PHACO AND INTRAOCULAR LENS PLACEMENT (IOC);  Surgeon: Clay Cummins, MD;  Location: [facility] OR[facility];  Service: Ophthalmology;  Laterality: Right;  CDE: 4.28   COLONOSCOPY     about 2 years in [place] around 2016   CYSTECTOMY     pt. denies   ESOPHAGOGASTRODUODENOSCOPY (EGD) WITH PROPOFOL  N/A 10/22/2022   Procedure: ESOPHAGOGASTRODUODENOSCOPY (EGD) WITH PROPOFOL ;  Surgeon: Suzette Espy, MD;  Location[facility];  Service: Endoscopy;  Laterality: N/A;  12:30 pm, pt know to arrive at 9:15, LM to see if pt can come earlier   IR FLUORO GUIDE CV LINE LEFT  12/20/2020   IR US  GUIDE VASC ACCESS LEFT  12/20/2020   MALONEY DILATION N/A 10/22/2022   Procedure: Londa Rival DILATION;  Surgeon: Suzette Espy, MD;  Location: [facility];  Service: Endoscopy;  Laterality: N/A;   Social History:  reports that he quit  smoking about 36 years ago. His smoking use included pipe, cigars, and cigarettes. He started smoking about 69 years ago. He has a 65.9 pack-year smoking history. He has never used smokeless tobacco. He reports that he does not drink alcohol  and does not use drugs.  No Known Allergies  Family History  Problem Relation Age of Onset   Heart disease Mother        cause of death   Diabetes Sister    Colon cancer Neg Hx     Prior to Admission medications   Medication Sig Start Date  End Date Taking? Authorizing Provider  amoxicillin (AMOXIL) 500 MG capsule Take 500 mg by mouth 2 (two) times daily. 03/12/24  Yes [provider]  cephALEXin  (KEFLEX ) 250 MG capsule Take 250 mg by mouth daily. 01/05/24  Yes [provider]  metoprolol  succinate (TOPROL -XL) 50 MG 24 hr tablet Take 50 mg by mouth daily. 03/15/24  Yes [provider]  NIFEdipine  (ADALAT  CC) 90 MG 24 hr tablet Take 90 mg by mouth daily. 03/21/24  Yes [provider]  aspirin  81 MG tablet Take 81 mg by mouth daily.    [provider]  gabapentin  (NEURONTIN ) 100 MG capsule Take 100 mg by mouth daily.    [provider]  hydrALAZINE  (APRESOLINE ) 50 MG tablet Take 50 mg by mouth in the morning and at bedtime.    [provider]  linagliptin  (TRADJENTA ) 5 MG TABS tablet Take 5 mg by mouth daily.    [provider]  metoprolol  tartrate (LOPRESSOR ) 25 MG tablet Take 1 tablet (25 mg total) by mouth daily. 01/24/24   Rayfield Cairo, MD  Multiple Vitamin (MULTIVITAMIN WITH MINERALS) TABS tablet Take 1 tablet by mouth daily.    [provider]  NIFEdipine  (PROCARDIA  XL/ADALAT -CC) 90 MG 24 hr tablet TAKE (1) TABLET BY MOUTH ONCE DAILY. Patient taking differently: Take 90 mg by mouth daily before breakfast. 06/17/18   Flavia Hughs, MD  pantoprazole  (PROTONIX ) 40 MG tablet Take 1 tablet (40 mg total) by mouth daily before breakfast. 12/31/22   Lanney Pitts, PA-C  sulfamethoxazole-trimethoprim (BACTRIM DS) 800-160 MG tablet Take 1 tablet by mouth 2 (two) times daily. 01/18/24   [provider]  tamsulosin  (FLOMAX ) 0.4 MG CAPS capsule Take 0.4 mg by mouth daily. 09/02/22   [provider]    Physical Exam: Vitals:   03/29/24 0715 03/29/24 0723 03/29/24 0745 03/29/24 0800  BP: (!) 157/78  (!) 214/79 (!) 203/78  Pulse: 88  79 74  Resp: 18  (!) 25 (!) 29  Temp: 98.2 F (36.8 C)     TempSrc: Oral     SpO2:   96% 96%  Weight:   73.5 kg    Height:  5' 5 (1.651 m)     GENERAL:  A&O x 3, NAD, well developed, cooperative, follows commands HEENT: [place]/AT, No thrush, No icterus, No oral ulcers Neck:  No neck mass, No meningismus, soft, supple CV: RRR, no S3, no S4, no rub, no JVD Lungs: bilateral crackles.  No wheeze Abd: soft/NT +BS, nondistended Ext: 1 + LE edema, no lymphangitis, no cyanosis, no rashes Neuro:  CN II-XII intact, strength 4/5 in RUE, RLE, strength 4/5 LUE, LLE; sensation intact bilateral; no dysmetria; babinski equivocal  Data Reviewed: Data reviewed above in history  Assessment and Plan: Acute respiratory failure with hypoxia -Secondary to fluid overload/pulmonary edema - Initially on CPAP - Currently on 4 L - Wean oxygen  as  tolerated  Pulmonary edema/fluid overload - Nephrology consulted for dialysis - Echocardiogram  Hypertensive urgency - Start Cleviprex drip - Start home antihypertensive medications and wean for SBP <180  ESRD - Dialyzes on Tuesday, Thursday, Saturday - Nephrology consulted for dialysis  Diabetes mellitus type 2 - Check hemoglobin A1c - NovoLog  sliding scale - Holding Tradjenta   Elevated troponin -no chest pain -due to demand ischemia -finish cycling     Advance Care Planning: FULL  Consults: renal  Family Communication: none present  Severity of Illness: The appropriate patient status for this patient is OBSERVATION. Observation status is judged to be reasonable and necessary in order to provide the required intensity of service to ensure the patient's safety. The patient's presenting symptoms, physical exam findings, and initial radiographic and laboratory data in the context of their medical condition is felt to place them at decreased risk for further clinical deterioration. Furthermore, it is anticipated that the patient will be medically stable for discharge from the hospital within 2 midnights of admission.   Author: Demaris Fillers, MD 03/29/2024 9:34  AM  For on call review [email or web address].

## 2024-03-29 NOTE — Progress Notes (Signed)
   HEMODIALYSIS TREATMENT NOTE:  3h+32m treatment completed using left forearm loop AVG (15g/retrograde flow). Goal challenge met: 3 liters removed without interruption in UF.  HD was stopped 48m early so that NS bolus could be given (with blood return) for symptomatic hypotension.  Dr. Cindra Cree was notified.  All blood was returned and hemostasis was achieved in 20 minutes.  No changes from pre-HD assessment.  Post-HD:  03/29/24 1930  Vitals  Temp 97.9 F (36.6 C)  Temp Source Oral  BP (!) 143/51  MAP (mmHg) 84  BP Location Right Arm  BP Method Automatic  Patient Position (if appropriate) Sitting  Pulse Rate 83  Pulse Rate Source Monitor  ECG Heart Rate 81  Resp 20  Oxygen  Therapy  SpO2 92 %  O2 Device Nasal Cannula  O2 Flow Rate (L/min) 2 L/min  Post Treatment  Dialyzer Clearance Lightly streaked  Hemodialysis Intake (mL) 100 mL  Liters Processed 60.1  Fluid Removed (mL) 3000 mL  Tolerated HD Treatment Yes  Post-Hemodialysis Comments Goal challenge met.  AVG/AVF Arterial Site Held (minutes) 10 minutes  AVG/AVF Venous Site Held (minutes) 10 minutes  Fistula / Graft Left Forearm Arteriovenous vein graft  Placement Date/Time: 02/21/21 0828   Placed prior to admission: No  Orientation: Left  Access Location: Forearm  Access Type: (c) Arteriovenous vein graft  Fistula / Graft Assessment Thrill;Bruit  Status Patent    Waunita Haff, RN [facility] [phone]

## 2024-03-30 DIAGNOSIS — E1169 Type 2 diabetes mellitus with other specified complication: Secondary | ICD-10-CM | POA: Diagnosis not present

## 2024-03-30 DIAGNOSIS — E785 Hyperlipidemia, unspecified: Secondary | ICD-10-CM

## 2024-03-30 DIAGNOSIS — I10 Essential (primary) hypertension: Secondary | ICD-10-CM

## 2024-03-30 DIAGNOSIS — N186 End stage renal disease: Secondary | ICD-10-CM | POA: Diagnosis not present

## 2024-03-30 DIAGNOSIS — Z992 Dependence on renal dialysis: Secondary | ICD-10-CM

## 2024-03-30 DIAGNOSIS — R0602 Shortness of breath: Secondary | ICD-10-CM | POA: Diagnosis not present

## 2024-03-30 DIAGNOSIS — C61 Malignant neoplasm of prostate: Secondary | ICD-10-CM | POA: Diagnosis not present

## 2024-03-30 DIAGNOSIS — I120 Hypertensive chronic kidney disease with stage 5 chronic kidney disease or end stage renal disease: Secondary | ICD-10-CM | POA: Diagnosis not present

## 2024-03-30 DIAGNOSIS — D631 Anemia in chronic kidney disease: Secondary | ICD-10-CM | POA: Diagnosis not present

## 2024-03-30 DIAGNOSIS — N2581 Secondary hyperparathyroidism of renal origin: Secondary | ICD-10-CM | POA: Diagnosis not present

## 2024-03-30 DIAGNOSIS — J810 Acute pulmonary edema: Secondary | ICD-10-CM | POA: Diagnosis not present

## 2024-03-30 LAB — CBC
HCT: 27.4 % — ABNORMAL LOW (ref 39.0–52.0)
Hemoglobin: 9.2 g/dL — ABNORMAL LOW (ref 13.0–17.0)
MCH: 27.5 pg (ref 26.0–34.0)
MCHC: 33.6 g/dL (ref 30.0–36.0)
MCV: 81.8 fL (ref 80.0–100.0)
Platelets: 201 10*3/uL (ref 150–400)
RBC: 3.35 MIL/uL — ABNORMAL LOW (ref 4.22–5.81)
RDW: 15.5 % (ref 11.5–15.5)
WBC: 10.7 10*3/uL — ABNORMAL HIGH (ref 4.0–10.5)
nRBC: 0 % (ref 0.0–0.2)

## 2024-03-30 LAB — BASIC METABOLIC PANEL WITH GFR
Anion gap: 12 (ref 5–15)
BUN: 23 mg/dL (ref 8–23)
CO2: 28 mmol/L (ref 22–32)
Calcium: 8.3 mg/dL — ABNORMAL LOW (ref 8.9–10.3)
Chloride: 98 mmol/L (ref 98–111)
Creatinine, Ser: 5.82 mg/dL — ABNORMAL HIGH (ref 0.61–1.24)
GFR, Estimated: 9 mL/min — ABNORMAL LOW (ref >60)
Glucose, Bld: 138 mg/dL — ABNORMAL HIGH (ref 70–99)
Potassium: 4.2 mmol/L (ref 3.5–5.1)
Sodium: 138 mmol/L (ref 135–145)

## 2024-03-30 LAB — HEPATITIS B SURFACE ANTIBODY, QUANTITATIVE: Hep B S AB Quant (Post): 79 m[IU]/mL

## 2024-03-30 LAB — PHOSPHORUS: Phosphorus: 2.4 mg/dL — ABNORMAL LOW (ref 2.5–4.6)

## 2024-03-30 MED ORDER — HYDRALAZINE HCL 50 MG PO TABS: 50.0000 mg | ORAL_TABLET | Freq: Three times a day (TID) | ORAL | 0 refills | Status: DC

## 2024-03-30 MED ORDER — HYDRALAZINE HCL 50 MG PO TABS: 50.0000 mg | ORAL_TABLET | Freq: Three times a day (TID) | ORAL | Status: DC

## 2024-03-30 NOTE — Progress Notes (Addendum)
 Patient alert and oriented x4. No complaints of pain, shortness of breath, chest pain, dizziness, nausea or vomiting. Patient ambulated in hallway with cane with O2 sats remaining >90% on room air. Patient denied and shortness of breath or discomfort. Dr Sunnie England made aware.  SWOT RN went over discharge summary, follow up information and medication education with patient. All questions answered and patient expressed full understanding of summary, information and education with teach back. IV removed with no signs of complications. Patient discharged with all belongings for home via car.

## 2024-03-30 NOTE — Discharge Summary (Signed)
 Physician Discharge Summary   Patient: Steve [name] MRN: [id number] DOB: [birth year]  Admit date:     03/29/2024  Discharge date: 03/30/24  Discharge Physician: Steve [name]   PCP: Fanta, Tesfaye Demissie, MD   Recommendations at discharge:    Hydralazine  increased to 50 mg tid, continue with metoprolol  succinate and nifedipine  Follow up with outpatient HD as scheduled tomorrow.  Follow up with Steve [name] in 7 to 10 days  Discharge Diagnoses: Principal Problem:   Hypertensive urgency Active Problems:   Elevated troponin   ESRD (end stage renal disease) (HCC)   Acute respiratory failure with hypoxia (HCC)  Resolved Problems:   * No resolved hospital problems. [facility] Hospital Course: Steve [name] was admitted to the hospital with volume overload and uncontrolled hypertension.   88 year old male with a history of ESRD (TTS) hypertension, diabetes mellitus type 2, hyperlipidemia, GERD, prostate cancer presenting with shortness of breath that began around 2 AM on 03/29/2024.  Patient endorses compliance with his dialysis. Because of severe dyspnea he called EMS, he was found hypoxemic with oxygen  saturation in the 80s.  He was placed on CPAP and was transported to the ED.  On his initial physical examination his blood pressure was 214/79, HR 88, RR 18 and 02 saturation 96% on supplemental 02 per [place] Lungs with rales bilaterally with no wheezing or rhonchi, heart with S1 and S2 present and regular with no gallops or rubs, abdomen with no distention and positive + lower extremity edema.   Na 140, K 4,2 CL 102 bicarbonate 21, glucose 194 bun 32 cr 7,99 AST 19 ALT 10  BNP 1,471 High sensitive troponin 30 and 37  Wbc 11,9 hgb 11,5 plt 240   Chest radiograph with no cardiomegaly, bilateral diffuse interstitial infiltrates, predominant at the lower lobes with small right pleural eaffusion.   EKG 90 bpm, left axis deviation, normal intervals, qtc 487, sinus rhythm with J point  elevation V1 to V3, no significant T wave changes.   Patient was placed on clevidipine drip for blood pressure control and underwent emergent renal replacement therapy with ultrafiltration.   06/18 patient off clevidipine infusion, blood pressure and volume status have improved after hemodialysis.  Plan to resume outpatient renal replacement therapy as outpatient.  Assessment and Plan: * End-stage renal disease on hemodialysis (HCC) Volume overload with acute hypoxemic respiratory failure due to acute pulmonary edema.  Volume status has improved after ultrafiltration, 3000 ml.  His 02 saturation today is 94% on room air at rest.   Plan to check ambulatory 02 sat on ambulation, if greater than 92%, patient will be discharged home and follow up with outpatient HD unit tomorrow.   Troponin elevation (high sensitive() due to volume overload, no acute coronary syndrome.   HTN (hypertension) Hypertensive emergency on admission, now resolved.  Blood pressure has improved.  Continue with hydralazine , nifedipine , and metoprolol .  Follow up blood pressure as outpatient.   Type 2 diabetes mellitus with hyperlipidemia (HCC) Glucose has been stable, he did no require insulin  therapy during his hospitalization.   Follow up as outpatient,   Prostate cancer (HCC) Follow up as outpatient.   BPH continue tamsulosin        Consultants: nephrology  Procedures performed: none   Disposition: Home Diet recommendation:  Cardiac diet DISCHARGE MEDICATION: Allergies as of 03/30/2024   No Known Allergies      Medication List     STOP taking these medications    amoxicillin 500 MG capsule Commonly known as:  AMOXIL       TAKE these medications    aspirin  81 MG tablet Take 81 mg by mouth daily.   gabapentin  100 MG capsule Commonly known as: NEURONTIN  Take 100 mg by mouth daily.   hydrALAZINE  50 MG tablet Commonly known as: APRESOLINE  Take 1 tablet (50 mg total) by mouth 3 (three)  times daily. What changed: when to take this   linagliptin  5 MG Tabs tablet Commonly known as: TRADJENTA  Take 5 mg by mouth daily.   metoprolol  succinate 50 MG 24 hr tablet Commonly known as: TOPROL -XL Take 50 mg by mouth daily.   NIFEdipine  90 MG 24 hr tablet Commonly known as: ADALAT  CC Take 90 mg by mouth daily.   pantoprazole  40 MG tablet Commonly known as: PROTONIX  Take 1 tablet (40 mg total) by mouth daily before breakfast.   tamsulosin  0.4 MG Caps capsule Commonly known as: FLOMAX  Take 0.4 mg by mouth daily.        Discharge Exam: Filed Weights   03/29/24 1515 03/29/24 1930 03/30/24 0445  Weight: 73.7 kg 70.8 kg 72.1 kg   BP (!) 149/44   Pulse 76   Temp 98.6 F (37 C) (Oral)   Resp 15   Ht 5' 5 (1.651 m)   Wt 72.1 kg   SpO2 94%   BMI 26.45 kg/m   Patient is feeling better, no chest pain and no dyspnea, no nausea or vomiting  Neurology awake and alert ENT with mild pallor with no icterus Cardiovascular with S1 and S2 present and regular with no gallops, rubs or murmurs Respiratory with mild rales at bases with no wheezing or rhonchi  Abdomen with no distention  Trace lower extremity edema   Condition at discharge: stable  The results of significant diagnostics from this hospitalization (including imaging, microbiology, ancillary and laboratory) are listed below for reference.   Imaging Studies: DG Chest Portable 1 View Result Date: 03/29/2024 CLINICAL DATA:  88 year old male with shortness of breath. EXAM: PORTABLE CHEST 1 VIEW COMPARISON:  Portable chest 01/23/2024 and earlier. FINDINGS: Portable AP upright view at 0718 hours. Similar lung volumes. Stable heart size at the upper limits of normal. Calcified aortic atherosclerosis. Other mediastinal contours are within normal limits. Progressed bilateral coarse and indistinct pulmonary interstitial opacity, fairly symmetric. Indistinct diaphragms. No pneumothorax. No consolidation. Visualized tracheal  air column is within normal limits. No acute osseous abnormality identified. Negative visible bowel gas. IMPRESSION: 1. Progressed bilateral pulmonary interstitial opacity since April favored to be Acute Pulmonary Edema. Viral/atypical respiratory infection felt less likely. 2. Aortic Atherosclerosis (ICD10-I70.0). Electronically Signed   By: Marlise Simpers M.D.   On: 03/29/2024 07:38    Microbiology: Results for orders placed or performed during the hospital encounter of 03/29/24  MRSA Next Gen by PCR, Nasal     Status: None   Collection Time: 03/29/24 11:40 AM   Specimen: Nasal Mucosa; Nasal Swab  Result Value Ref Range Status   MRSA by PCR Next Gen NOT DETECTED NOT DETECTED Final    Comment: (NOTE) The GeneXpert MRSA Assay (FDA approved for NASAL specimens only), is one component of a comprehensive MRSA colonization surveillance program. It is not intended to diagnose MRSA infection nor to guide or monitor treatment for MRSA infections. Test performance is not FDA approved in patients less than 54 years old. Performed at [facility], [address], [place], [place] [address]     Labs: CBC: Recent Labs  Lab 03/29/24 0734 03/30/24 0445  WBC 11.9* 10.7*  NEUTROABS 10.5*  --   HGB 11.5* 9.2*  HCT 34.1* 27.4*  MCV 81.6 81.8  PLT 240 201   Basic Metabolic Panel: Recent Labs  Lab 03/29/24 0734 03/30/24 0445  NA 140 138  K 4.2 4.2  CL 102 98  CO2 21* 28  GLUCOSE 194* 138*  BUN 32* 23  CREATININE 7.99* 5.82*  CALCIUM 8.9 8.3*  PHOS  --  2.4*   Liver Function Tests: Recent Labs  Lab 03/29/24 0734  AST 19  ALT 10  ALKPHOS 83  BILITOT 0.6  PROT 8.4*  ALBUMIN 3.4*   CBG: Recent Labs  Lab 03/29/24 1922  GLUCAP 98    Discharge time spent: greater than 30 minutes.  Signed: Albertus Alt, MD [facility] 03/30/2024

## 2024-03-30 NOTE — Assessment & Plan Note (Addendum)
 Volume overload with acute hypoxemic respiratory failure due to acute pulmonary edema.  Volume status has improved after ultrafiltration, 3000 ml.  His 02 saturation today is 94% on room air at rest.   Plan to check ambulatory 02 sat on ambulation, if greater than 92%, patient will be discharged home and follow up with outpatient HD unit tomorrow.   Troponin elevation (high sensitive() due to volume overload, no acute coronary syndrome.

## 2024-03-30 NOTE — Progress Notes (Signed)
 Transition of Care Department [facility]) has reviewed patient and no other TOC needs have been identified at this time. We will continue to monitor patient advancement through interdisciplinary progression rounds. If new patient transition needs arise, please place a TOC consult.    03/30/24 0733  TOC Brief Assessment  Insurance and Status Reviewed  Patient has primary care physician Yes  Home environment has been reviewed Lives alone.  Prior level of function: Independent.  Prior/Current Home Services No current home services  Social Drivers of Health Review SDOH reviewed no interventions necessary  Readmission risk has been reviewed Yes  Transition of care needs no transition of care needs at this time

## 2024-03-30 NOTE — Assessment & Plan Note (Addendum)
 Hypertensive emergency on admission, now resolved.  Blood pressure has improved.  Continue with hydralazine , nifedipine , and metoprolol .  Follow up blood pressure as outpatient.

## 2024-03-30 NOTE — Assessment & Plan Note (Signed)
 Glucose has been stable, he did no require insulin  therapy during his hospitalization.   Follow up as outpatient,

## 2024-03-30 NOTE — Plan of Care (Signed)
  Problem: Education: Goal: Knowledge of General Education information will improve Description: Including pain rating scale, medication(s)/side effects and non-pharmacologic comfort measures Outcome: Progressing   Problem: Clinical Measurements: Goal: Respiratory complications will improve Outcome: Progressing   Problem: Nutrition: Goal: Adequate nutrition will be maintained Outcome: Progressing   Problem: Coping: Goal: Level of anxiety will decrease Outcome: Progressing   Problem: Pain Managment: Goal: General experience of comfort will improve and/or be controlled Outcome: Progressing   Problem: Safety: Goal: Ability to remain free from injury will improve Outcome: Progressing   Problem: Skin Integrity: Goal: Risk for impaired skin integrity will decrease Outcome: Progressing

## 2024-03-30 NOTE — Assessment & Plan Note (Addendum)
 Follow up as outpatient.   BPH continue tamsulosin 

## 2024-03-30 NOTE — Progress Notes (Signed)
 Patient ID: Steve [name], male   DOB: 1936-02-17, 88 y.o.   MRN: [id number] [name] Progress Note   Assessment/ Plan:   1.  Acute hypoxic respiratory failure: Secondary to fluid overload/pulmonary edema and patient with end-stage renal disease.  He denies any indiscretions with fluid intake over the weekend and is surprised with his pulmonary edema-suspect may be related to hypertensive urgency. 2. ESRD: On hemodialysis, TTS schedule and underwent dialysis yesterday with ultrafiltration of 3 L with early termination of dialysis because of hypotension/need for saline bolus.  He does not have indication for additional dialysis today (93% oxygen  saturation on room air with isolated left base fine rales).  Will order for next dialysis tomorrow. 3. Anemia: Hematocrit within acceptable range and without overt loss.  Continue to monitor and resume ESA with OP HD. 4. CKD-MBD: Calcium level at goal, will add phosphorus to earlier labs.  He is not on a binder but takes calcitriol  for PTH control. 5. Nutrition: Resume renal diet with sodium instruction. 6. Hypertension: Earlier with evidence of hypertensive urgency that is improved, patient weaned off clevidipine drip and oral antihypertensive agents this morning.  Subjective:   Feeling better this morning with significant improvement of shortness of breath.   Objective:   BP (!) 156/45   Pulse 73   Temp 98.6 F (37 C) (Oral)   Resp 10   Ht 5' 5 (1.651 m)   Wt 72.1 kg   SpO2 95%   BMI 26.45 kg/m   Physical Exam: Gen: Comfortably resting in bed, able to speak full sentences CVS: Pulse regular rhythm rate, S1 and S2 normal Resp: Bilateral rales left base otherwise clear to auscultation, no wheeze Abd: Soft, flat, nontender, bowel sounds normal Ext: Left forearm loop graft, no lower extremity edema  Labs: BMET Recent Labs  Lab 03/29/24 0734 03/30/24 0445  NA 140 138  K 4.2 4.2  CL 102 98  CO2 21* 28  GLUCOSE 194*  138*  BUN 32* 23  CREATININE 7.99* 5.82*  CALCIUM 8.9 8.3*   CBC Recent Labs  Lab 03/29/24 0734 03/30/24 0445  WBC 11.9* 10.7*  NEUTROABS 10.5*  --   HGB 11.5* 9.2*  HCT 34.1* 27.4*  MCV 81.6 81.8  PLT 240 201     Medications:     aspirin   81 mg Oral Daily   calcitRIOL   0.75 mcg Oral Q M,W,F   Chlorhexidine  Gluconate Cloth  6 each Topical Q0600   gabapentin   100 mg Oral Daily   heparin   2,000 Units Dialysis Once in dialysis   hydrALAZINE   50 mg Oral BID   metoprolol  succinate  50 mg Oral Daily   NIFEdipine   90 mg Oral Daily   pantoprazole   40 mg Oral QAC breakfast   tamsulosin   0.4 mg Oral Daily   Clevester Dally, MD 03/30/2024, 8:15 AM

## 2024-03-30 NOTE — Care Management Obs Status (Signed)
 MEDICARE OBSERVATION STATUS NOTIFICATION   Patient Details  Name: Dany Walther MRN: [id number] Date of Birth: 12/05/1935   Medicare Observation Status Notification Given:  Yes    Neila Bally 03/30/2024, 10:25 AM

## 2024-03-31 DIAGNOSIS — N2581 Secondary hyperparathyroidism of renal origin: Secondary | ICD-10-CM | POA: Diagnosis not present

## 2024-03-31 DIAGNOSIS — N186 End stage renal disease: Secondary | ICD-10-CM | POA: Diagnosis not present

## 2024-03-31 DIAGNOSIS — Z23 Encounter for immunization: Secondary | ICD-10-CM | POA: Diagnosis not present

## 2024-03-31 DIAGNOSIS — Z992 Dependence on renal dialysis: Secondary | ICD-10-CM | POA: Diagnosis not present

## 2024-04-02 DIAGNOSIS — N186 End stage renal disease: Secondary | ICD-10-CM | POA: Diagnosis not present

## 2024-04-02 DIAGNOSIS — Z23 Encounter for immunization: Secondary | ICD-10-CM | POA: Diagnosis not present

## 2024-04-02 DIAGNOSIS — Z992 Dependence on renal dialysis: Secondary | ICD-10-CM | POA: Diagnosis not present

## 2024-04-02 DIAGNOSIS — N2581 Secondary hyperparathyroidism of renal origin: Secondary | ICD-10-CM | POA: Diagnosis not present

## 2024-04-05 DIAGNOSIS — Z992 Dependence on renal dialysis: Secondary | ICD-10-CM | POA: Diagnosis not present

## 2024-04-05 DIAGNOSIS — Z23 Encounter for immunization: Secondary | ICD-10-CM | POA: Diagnosis not present

## 2024-04-05 DIAGNOSIS — Z794 Long term (current) use of insulin: Secondary | ICD-10-CM | POA: Diagnosis not present

## 2024-04-05 DIAGNOSIS — N186 End stage renal disease: Secondary | ICD-10-CM | POA: Diagnosis not present

## 2024-04-05 DIAGNOSIS — N2581 Secondary hyperparathyroidism of renal origin: Secondary | ICD-10-CM | POA: Diagnosis not present

## 2024-04-05 DIAGNOSIS — E119 Type 2 diabetes mellitus without complications: Secondary | ICD-10-CM | POA: Diagnosis not present

## 2024-04-07 DIAGNOSIS — N2581 Secondary hyperparathyroidism of renal origin: Secondary | ICD-10-CM | POA: Diagnosis not present

## 2024-04-07 DIAGNOSIS — Z992 Dependence on renal dialysis: Secondary | ICD-10-CM | POA: Diagnosis not present

## 2024-04-07 DIAGNOSIS — Z23 Encounter for immunization: Secondary | ICD-10-CM | POA: Diagnosis not present

## 2024-04-07 DIAGNOSIS — N186 End stage renal disease: Secondary | ICD-10-CM | POA: Diagnosis not present

## 2024-04-09 DIAGNOSIS — N2581 Secondary hyperparathyroidism of renal origin: Secondary | ICD-10-CM | POA: Diagnosis not present

## 2024-04-09 DIAGNOSIS — Z23 Encounter for immunization: Secondary | ICD-10-CM | POA: Diagnosis not present

## 2024-04-09 DIAGNOSIS — Z992 Dependence on renal dialysis: Secondary | ICD-10-CM | POA: Diagnosis not present

## 2024-04-09 DIAGNOSIS — N186 End stage renal disease: Secondary | ICD-10-CM | POA: Diagnosis not present

## 2024-04-11 DIAGNOSIS — N186 End stage renal disease: Secondary | ICD-10-CM | POA: Diagnosis not present

## 2024-04-11 DIAGNOSIS — Z992 Dependence on renal dialysis: Secondary | ICD-10-CM | POA: Diagnosis not present

## 2024-04-12 DIAGNOSIS — N186 End stage renal disease: Secondary | ICD-10-CM | POA: Diagnosis not present

## 2024-04-12 DIAGNOSIS — Z992 Dependence on renal dialysis: Secondary | ICD-10-CM | POA: Diagnosis not present

## 2024-04-12 DIAGNOSIS — N2581 Secondary hyperparathyroidism of renal origin: Secondary | ICD-10-CM | POA: Diagnosis not present

## 2024-04-14 DIAGNOSIS — N186 End stage renal disease: Secondary | ICD-10-CM | POA: Diagnosis not present

## 2024-04-14 DIAGNOSIS — Z992 Dependence on renal dialysis: Secondary | ICD-10-CM | POA: Diagnosis not present

## 2024-04-14 DIAGNOSIS — N2581 Secondary hyperparathyroidism of renal origin: Secondary | ICD-10-CM | POA: Diagnosis not present

## 2024-04-16 DIAGNOSIS — N186 End stage renal disease: Secondary | ICD-10-CM | POA: Diagnosis not present

## 2024-04-16 DIAGNOSIS — N2581 Secondary hyperparathyroidism of renal origin: Secondary | ICD-10-CM | POA: Diagnosis not present

## 2024-04-16 DIAGNOSIS — Z992 Dependence on renal dialysis: Secondary | ICD-10-CM | POA: Diagnosis not present

## 2024-04-19 DIAGNOSIS — N2581 Secondary hyperparathyroidism of renal origin: Secondary | ICD-10-CM | POA: Diagnosis not present

## 2024-04-19 DIAGNOSIS — Z992 Dependence on renal dialysis: Secondary | ICD-10-CM | POA: Diagnosis not present

## 2024-04-19 DIAGNOSIS — N186 End stage renal disease: Secondary | ICD-10-CM | POA: Diagnosis not present

## 2024-04-21 DIAGNOSIS — N2581 Secondary hyperparathyroidism of renal origin: Secondary | ICD-10-CM | POA: Diagnosis not present

## 2024-04-21 DIAGNOSIS — N186 End stage renal disease: Secondary | ICD-10-CM | POA: Diagnosis not present

## 2024-04-21 DIAGNOSIS — Z992 Dependence on renal dialysis: Secondary | ICD-10-CM | POA: Diagnosis not present

## 2024-04-23 DIAGNOSIS — Z992 Dependence on renal dialysis: Secondary | ICD-10-CM | POA: Diagnosis not present

## 2024-04-23 DIAGNOSIS — N186 End stage renal disease: Secondary | ICD-10-CM | POA: Diagnosis not present

## 2024-04-23 DIAGNOSIS — N2581 Secondary hyperparathyroidism of renal origin: Secondary | ICD-10-CM | POA: Diagnosis not present

## 2024-04-26 DIAGNOSIS — Z992 Dependence on renal dialysis: Secondary | ICD-10-CM | POA: Diagnosis not present

## 2024-04-26 DIAGNOSIS — N186 End stage renal disease: Secondary | ICD-10-CM | POA: Diagnosis not present

## 2024-04-26 DIAGNOSIS — N2581 Secondary hyperparathyroidism of renal origin: Secondary | ICD-10-CM | POA: Diagnosis not present

## 2024-04-28 DIAGNOSIS — N2581 Secondary hyperparathyroidism of renal origin: Secondary | ICD-10-CM | POA: Diagnosis not present

## 2024-04-28 DIAGNOSIS — N186 End stage renal disease: Secondary | ICD-10-CM | POA: Diagnosis not present

## 2024-04-28 DIAGNOSIS — Z992 Dependence on renal dialysis: Secondary | ICD-10-CM | POA: Diagnosis not present

## 2024-04-30 DIAGNOSIS — N186 End stage renal disease: Secondary | ICD-10-CM | POA: Diagnosis not present

## 2024-04-30 DIAGNOSIS — N2581 Secondary hyperparathyroidism of renal origin: Secondary | ICD-10-CM | POA: Diagnosis not present

## 2024-04-30 DIAGNOSIS — Z992 Dependence on renal dialysis: Secondary | ICD-10-CM | POA: Diagnosis not present

## 2024-05-03 DIAGNOSIS — Z992 Dependence on renal dialysis: Secondary | ICD-10-CM | POA: Diagnosis not present

## 2024-05-03 DIAGNOSIS — N186 End stage renal disease: Secondary | ICD-10-CM | POA: Diagnosis not present

## 2024-05-03 DIAGNOSIS — N2581 Secondary hyperparathyroidism of renal origin: Secondary | ICD-10-CM | POA: Diagnosis not present

## 2024-05-05 DIAGNOSIS — N186 End stage renal disease: Secondary | ICD-10-CM | POA: Diagnosis not present

## 2024-05-05 DIAGNOSIS — N2581 Secondary hyperparathyroidism of renal origin: Secondary | ICD-10-CM | POA: Diagnosis not present

## 2024-05-05 DIAGNOSIS — Z992 Dependence on renal dialysis: Secondary | ICD-10-CM | POA: Diagnosis not present

## 2024-05-07 DIAGNOSIS — Z992 Dependence on renal dialysis: Secondary | ICD-10-CM | POA: Diagnosis not present

## 2024-05-07 DIAGNOSIS — N2581 Secondary hyperparathyroidism of renal origin: Secondary | ICD-10-CM | POA: Diagnosis not present

## 2024-05-07 DIAGNOSIS — N186 End stage renal disease: Secondary | ICD-10-CM | POA: Diagnosis not present

## 2024-05-10 DIAGNOSIS — Z992 Dependence on renal dialysis: Secondary | ICD-10-CM | POA: Diagnosis not present

## 2024-05-10 DIAGNOSIS — N186 End stage renal disease: Secondary | ICD-10-CM | POA: Diagnosis not present

## 2024-05-10 DIAGNOSIS — N2581 Secondary hyperparathyroidism of renal origin: Secondary | ICD-10-CM | POA: Diagnosis not present

## 2024-05-12 DIAGNOSIS — N186 End stage renal disease: Secondary | ICD-10-CM | POA: Diagnosis not present

## 2024-05-12 DIAGNOSIS — Z992 Dependence on renal dialysis: Secondary | ICD-10-CM | POA: Diagnosis not present

## 2024-05-12 DIAGNOSIS — N2581 Secondary hyperparathyroidism of renal origin: Secondary | ICD-10-CM | POA: Diagnosis not present

## 2024-05-14 DIAGNOSIS — N186 End stage renal disease: Secondary | ICD-10-CM | POA: Diagnosis not present

## 2024-05-14 DIAGNOSIS — Z992 Dependence on renal dialysis: Secondary | ICD-10-CM | POA: Diagnosis not present

## 2024-05-17 DIAGNOSIS — N186 End stage renal disease: Secondary | ICD-10-CM | POA: Diagnosis not present

## 2024-05-17 DIAGNOSIS — Z992 Dependence on renal dialysis: Secondary | ICD-10-CM | POA: Diagnosis not present

## 2024-05-19 DIAGNOSIS — N186 End stage renal disease: Secondary | ICD-10-CM | POA: Diagnosis not present

## 2024-05-19 DIAGNOSIS — Z992 Dependence on renal dialysis: Secondary | ICD-10-CM | POA: Diagnosis not present

## 2024-05-21 DIAGNOSIS — Z992 Dependence on renal dialysis: Secondary | ICD-10-CM | POA: Diagnosis not present

## 2024-05-21 DIAGNOSIS — N186 End stage renal disease: Secondary | ICD-10-CM | POA: Diagnosis not present

## 2024-05-24 DIAGNOSIS — N186 End stage renal disease: Secondary | ICD-10-CM | POA: Diagnosis not present

## 2024-05-24 DIAGNOSIS — Z992 Dependence on renal dialysis: Secondary | ICD-10-CM | POA: Diagnosis not present

## 2024-05-26 DIAGNOSIS — N186 End stage renal disease: Secondary | ICD-10-CM | POA: Diagnosis not present

## 2024-05-26 DIAGNOSIS — Z992 Dependence on renal dialysis: Secondary | ICD-10-CM | POA: Diagnosis not present

## 2024-05-28 DIAGNOSIS — N186 End stage renal disease: Secondary | ICD-10-CM | POA: Diagnosis not present

## 2024-05-28 DIAGNOSIS — Z992 Dependence on renal dialysis: Secondary | ICD-10-CM | POA: Diagnosis not present

## 2024-05-31 DIAGNOSIS — N186 End stage renal disease: Secondary | ICD-10-CM | POA: Diagnosis not present

## 2024-05-31 DIAGNOSIS — Z992 Dependence on renal dialysis: Secondary | ICD-10-CM | POA: Diagnosis not present

## 2024-06-02 DIAGNOSIS — Z992 Dependence on renal dialysis: Secondary | ICD-10-CM | POA: Diagnosis not present

## 2024-06-02 DIAGNOSIS — N186 End stage renal disease: Secondary | ICD-10-CM | POA: Diagnosis not present

## 2024-06-04 DIAGNOSIS — Z992 Dependence on renal dialysis: Secondary | ICD-10-CM | POA: Diagnosis not present

## 2024-06-04 DIAGNOSIS — N186 End stage renal disease: Secondary | ICD-10-CM | POA: Diagnosis not present

## 2024-06-07 DIAGNOSIS — N186 End stage renal disease: Secondary | ICD-10-CM | POA: Diagnosis not present

## 2024-06-07 DIAGNOSIS — Z992 Dependence on renal dialysis: Secondary | ICD-10-CM | POA: Diagnosis not present

## 2024-06-09 DIAGNOSIS — Z992 Dependence on renal dialysis: Secondary | ICD-10-CM | POA: Diagnosis not present

## 2024-06-09 DIAGNOSIS — N186 End stage renal disease: Secondary | ICD-10-CM | POA: Diagnosis not present

## 2024-06-11 DIAGNOSIS — Z992 Dependence on renal dialysis: Secondary | ICD-10-CM | POA: Diagnosis not present

## 2024-06-11 DIAGNOSIS — N186 End stage renal disease: Secondary | ICD-10-CM | POA: Diagnosis not present

## 2024-06-12 DIAGNOSIS — Z992 Dependence on renal dialysis: Secondary | ICD-10-CM | POA: Diagnosis not present

## 2024-06-12 DIAGNOSIS — N186 End stage renal disease: Secondary | ICD-10-CM | POA: Diagnosis not present

## 2024-06-14 DIAGNOSIS — N186 End stage renal disease: Secondary | ICD-10-CM | POA: Diagnosis not present

## 2024-06-14 DIAGNOSIS — Z992 Dependence on renal dialysis: Secondary | ICD-10-CM | POA: Diagnosis not present

## 2024-06-16 DIAGNOSIS — Z992 Dependence on renal dialysis: Secondary | ICD-10-CM | POA: Diagnosis not present

## 2024-06-16 DIAGNOSIS — N186 End stage renal disease: Secondary | ICD-10-CM | POA: Diagnosis not present

## 2024-06-18 DIAGNOSIS — N186 End stage renal disease: Secondary | ICD-10-CM | POA: Diagnosis not present

## 2024-06-18 DIAGNOSIS — Z992 Dependence on renal dialysis: Secondary | ICD-10-CM | POA: Diagnosis not present

## 2024-06-21 DIAGNOSIS — Z992 Dependence on renal dialysis: Secondary | ICD-10-CM | POA: Diagnosis not present

## 2024-06-21 DIAGNOSIS — N186 End stage renal disease: Secondary | ICD-10-CM | POA: Diagnosis not present

## 2024-06-23 DIAGNOSIS — Z992 Dependence on renal dialysis: Secondary | ICD-10-CM | POA: Diagnosis not present

## 2024-06-23 DIAGNOSIS — N186 End stage renal disease: Secondary | ICD-10-CM | POA: Diagnosis not present

## 2024-06-25 DIAGNOSIS — N186 End stage renal disease: Secondary | ICD-10-CM | POA: Diagnosis not present

## 2024-06-25 DIAGNOSIS — Z992 Dependence on renal dialysis: Secondary | ICD-10-CM | POA: Diagnosis not present

## 2024-06-28 DIAGNOSIS — Z992 Dependence on renal dialysis: Secondary | ICD-10-CM | POA: Diagnosis not present

## 2024-06-28 DIAGNOSIS — N186 End stage renal disease: Secondary | ICD-10-CM | POA: Diagnosis not present

## 2024-06-30 DIAGNOSIS — Z992 Dependence on renal dialysis: Secondary | ICD-10-CM | POA: Diagnosis not present

## 2024-06-30 DIAGNOSIS — N186 End stage renal disease: Secondary | ICD-10-CM | POA: Diagnosis not present

## 2024-07-02 DIAGNOSIS — N186 End stage renal disease: Secondary | ICD-10-CM | POA: Diagnosis not present

## 2024-07-02 DIAGNOSIS — Z992 Dependence on renal dialysis: Secondary | ICD-10-CM | POA: Diagnosis not present

## 2024-07-04 DIAGNOSIS — E1142 Type 2 diabetes mellitus with diabetic polyneuropathy: Secondary | ICD-10-CM | POA: Diagnosis not present

## 2024-07-04 DIAGNOSIS — Z23 Encounter for immunization: Secondary | ICD-10-CM | POA: Diagnosis not present

## 2024-07-04 DIAGNOSIS — M1990 Unspecified osteoarthritis, unspecified site: Secondary | ICD-10-CM | POA: Diagnosis not present

## 2024-07-04 DIAGNOSIS — E785 Hyperlipidemia, unspecified: Secondary | ICD-10-CM | POA: Diagnosis not present

## 2024-07-04 DIAGNOSIS — Z992 Dependence on renal dialysis: Secondary | ICD-10-CM | POA: Diagnosis not present

## 2024-07-04 DIAGNOSIS — I120 Hypertensive chronic kidney disease with stage 5 chronic kidney disease or end stage renal disease: Secondary | ICD-10-CM | POA: Diagnosis not present

## 2024-07-05 DIAGNOSIS — N186 End stage renal disease: Secondary | ICD-10-CM | POA: Diagnosis not present

## 2024-07-05 DIAGNOSIS — Z992 Dependence on renal dialysis: Secondary | ICD-10-CM | POA: Diagnosis not present

## 2024-07-05 DIAGNOSIS — Z794 Long term (current) use of insulin: Secondary | ICD-10-CM | POA: Diagnosis not present

## 2024-07-07 DIAGNOSIS — N186 End stage renal disease: Secondary | ICD-10-CM | POA: Diagnosis not present

## 2024-07-07 DIAGNOSIS — Z992 Dependence on renal dialysis: Secondary | ICD-10-CM | POA: Diagnosis not present

## 2024-07-09 DIAGNOSIS — N186 End stage renal disease: Secondary | ICD-10-CM | POA: Diagnosis not present

## 2024-07-09 DIAGNOSIS — Z992 Dependence on renal dialysis: Secondary | ICD-10-CM | POA: Diagnosis not present

## 2024-07-12 DIAGNOSIS — N186 End stage renal disease: Secondary | ICD-10-CM | POA: Diagnosis not present

## 2024-07-12 DIAGNOSIS — Z992 Dependence on renal dialysis: Secondary | ICD-10-CM | POA: Diagnosis not present

## 2024-09-07 ENCOUNTER — Observation Stay (HOSPITAL_COMMUNITY)
Admission: EM | Admit: 2024-09-07 | Discharge: 2024-09-12 | Disposition: A | Discharge status: Home / Self Care | Attending: Internal Medicine | Admitting: Internal Medicine

## 2024-09-07 ENCOUNTER — Emergency Department (HOSPITAL_COMMUNITY)

## 2024-09-07 ENCOUNTER — Other Ambulatory Visit: Payer: Self-pay

## 2024-09-07 DIAGNOSIS — R9431 Abnormal electrocardiogram [ECG] [EKG]: Secondary | ICD-10-CM | POA: Diagnosis not present

## 2024-09-07 DIAGNOSIS — R531 Weakness: Secondary | ICD-10-CM | POA: Diagnosis not present

## 2024-09-07 DIAGNOSIS — N186 End stage renal disease: Secondary | ICD-10-CM | POA: Insufficient documentation

## 2024-09-07 DIAGNOSIS — R42 Dizziness and giddiness: Principal | ICD-10-CM | POA: Insufficient documentation

## 2024-09-07 DIAGNOSIS — Z87891 Personal history of nicotine dependence: Secondary | ICD-10-CM | POA: Insufficient documentation

## 2024-09-07 DIAGNOSIS — Z8546 Personal history of malignant neoplasm of prostate: Secondary | ICD-10-CM | POA: Diagnosis not present

## 2024-09-07 DIAGNOSIS — Z7982 Long term (current) use of aspirin: Secondary | ICD-10-CM | POA: Diagnosis not present

## 2024-09-07 DIAGNOSIS — E1122 Type 2 diabetes mellitus with diabetic chronic kidney disease: Secondary | ICD-10-CM | POA: Insufficient documentation

## 2024-09-07 DIAGNOSIS — R001 Bradycardia, unspecified: Secondary | ICD-10-CM | POA: Diagnosis not present

## 2024-09-07 DIAGNOSIS — I120 Hypertensive chronic kidney disease with stage 5 chronic kidney disease or end stage renal disease: Secondary | ICD-10-CM | POA: Diagnosis not present

## 2024-09-07 DIAGNOSIS — R7989 Other specified abnormal findings of blood chemistry: Secondary | ICD-10-CM | POA: Diagnosis not present

## 2024-09-07 DIAGNOSIS — D631 Anemia in chronic kidney disease: Secondary | ICD-10-CM | POA: Diagnosis not present

## 2024-09-07 DIAGNOSIS — E211 Secondary hyperparathyroidism, not elsewhere classified: Secondary | ICD-10-CM | POA: Diagnosis not present

## 2024-09-07 DIAGNOSIS — D509 Iron deficiency anemia, unspecified: Secondary | ICD-10-CM | POA: Diagnosis not present

## 2024-09-07 DIAGNOSIS — Z992 Dependence on renal dialysis: Secondary | ICD-10-CM | POA: Insufficient documentation

## 2024-09-07 LAB — COMPREHENSIVE METABOLIC PANEL WITH GFR
ALT: 11 U/L (ref 0–44)
AST: 24 U/L (ref 15–41)
Albumin: 4.3 g/dL (ref 3.5–5.0)
Alkaline Phosphatase: 102 U/L (ref 38–126)
Anion gap: 13 (ref 5–15)
BUN: 31 mg/dL — ABNORMAL HIGH (ref 8–23)
CO2: 28 mmol/L (ref 22–32)
Calcium: 9.4 mg/dL (ref 8.9–10.3)
Chloride: 99 mmol/L (ref 98–111)
Creatinine, Ser: 6.63 mg/dL — ABNORMAL HIGH (ref 0.61–1.24)
GFR, Estimated: 8 mL/min — ABNORMAL LOW (ref >60)
Glucose, Bld: 136 mg/dL — ABNORMAL HIGH (ref 70–99)
Potassium: 3.7 mmol/L (ref 3.5–5.1)
Sodium: 140 mmol/L (ref 135–145)
Total Bilirubin: 0.4 mg/dL (ref 0.0–1.2)
Total Protein: 7.9 g/dL (ref 6.5–8.1)

## 2024-09-07 LAB — TROPONIN T, HIGH SENSITIVITY
Troponin T High Sensitivity: 80 ng/L — ABNORMAL HIGH (ref 0–19)
Troponin T High Sensitivity: 84 ng/L — ABNORMAL HIGH (ref 0–19)
Troponin T High Sensitivity: 83 ng/L — ABNORMAL HIGH (ref 0–19)

## 2024-09-07 LAB — CBC
HCT: 35.7 % — ABNORMAL LOW (ref 39.0–52.0)
Hemoglobin: 11.9 g/dL — ABNORMAL LOW (ref 13.0–17.0)
MCH: 26.3 pg (ref 26.0–34.0)
MCHC: 33.3 g/dL (ref 30.0–36.0)
MCV: 79 fL — ABNORMAL LOW (ref 80.0–100.0)
Platelets: 205 K/uL (ref 150–400)
RBC: 4.52 MIL/uL (ref 4.22–5.81)
RDW: 17.8 % — ABNORMAL HIGH (ref 11.5–15.5)
WBC: 6.5 K/uL (ref 4.0–10.5)
nRBC: 0 % (ref 0.0–0.2)

## 2024-09-07 LAB — RESP PANEL BY RT-PCR (RSV, FLU A&B, COVID)  RVPGX2
Influenza A by PCR: NEGATIVE
Influenza B by PCR: NEGATIVE
Resp Syncytial Virus by PCR: NEGATIVE
SARS Coronavirus 2 by RT PCR: NEGATIVE

## 2024-09-07 LAB — URINALYSIS, ROUTINE W REFLEX MICROSCOPIC
Bacteria, UA: NONE SEEN
Bilirubin Urine: NEGATIVE
Glucose, UA: 50 mg/dL — AB
Hgb urine dipstick: NEGATIVE
Ketones, ur: NEGATIVE mg/dL
Leukocytes,Ua: NEGATIVE
Nitrite: NEGATIVE
Protein, ur: 100 mg/dL — AB
Specific Gravity, Urine: 1.012 (ref 1.005–1.030)
pH: 7 (ref 5.0–8.0)

## 2024-09-07 LAB — CBG MONITORING, ED: Glucose-Capillary: 134 mg/dL — ABNORMAL HIGH (ref 70–99)

## 2024-09-07 MED ORDER — MECLIZINE HCL 12.5 MG PO TABS
25.0000 mg | ORAL_TABLET | Freq: Once | ORAL | Status: AC
  Administered 2024-09-07: 25 mg via ORAL
  Filled 2024-09-07: qty 2

## 2024-09-07 MED ORDER — HEPARIN SODIUM (PORCINE) 5000 UNIT/ML IJ SOLN
5000.0000 [IU] | Freq: Three times a day (TID) | INTRAMUSCULAR | Status: DC
  Administered 2024-09-08 – 2024-09-12 (×13): 5000 [IU] via SUBCUTANEOUS
  Filled 2024-09-07 (×13): qty 1

## 2024-09-07 MED ORDER — ACETAMINOPHEN 650 MG RE SUPP: 650.0000 mg | Freq: Four times a day (QID) | RECTAL | Status: DC | PRN

## 2024-09-07 MED ORDER — ONDANSETRON HCL 4 MG PO TABS: 4.0000 mg | ORAL_TABLET | Freq: Four times a day (QID) | ORAL | Status: DC | PRN

## 2024-09-07 MED ORDER — SODIUM CHLORIDE 0.9 % IV BOLUS
500.0000 mL | Freq: Once | INTRAVENOUS | Status: AC
  Administered 2024-09-07: 500 mL via INTRAVENOUS

## 2024-09-07 MED ORDER — ACETAMINOPHEN 325 MG PO TABS
650.0000 mg | ORAL_TABLET | Freq: Four times a day (QID) | ORAL | Status: DC | PRN
  Administered 2024-09-10 – 2024-09-11 (×2): 650 mg via ORAL
  Filled 2024-09-07 (×2): qty 2

## 2024-09-07 MED ORDER — ONDANSETRON HCL 4 MG/2ML IJ SOLN: 4.0000 mg | Freq: Four times a day (QID) | INTRAMUSCULAR | Status: DC | PRN

## 2024-09-07 NOTE — H&P (Incomplete)
 History and Physical    Patient: Steve [name] DOB: [birth year] DOA: 09/07/2024 DOS: the patient was seen and examined on 09/07/2024 PCP: Carlette Benita Area, MD  Patient coming from: {Point_of_Origin:26777}  Chief Complaint:  Chief Complaint  Patient presents with   Dizziness   HPI: Steve [name] is a 88 y.o. male with medical history significant of ***  Review of Systems: {ROS_Text:26778} Past Medical History:  Diagnosis Date   Anemia    Arthritis    ESRD (end stage renal disease) on dialysis (HCC)    T/TH/Sat   Essential hypertension    GERD (gastroesophageal reflux disease)    Hyperlipidemia    Prostate cancer (HCC)    took radiation   Type 2 diabetes mellitus (HCC)    Past Surgical History:  Procedure Laterality Date   ANTERIOR CERVICAL DECOMP/DISCECTOMY FUSION N/A 08/23/2020   Procedure: Cervical 3-4 Anterior cervical decompression/discectomy/fusion;  Surgeon: Unice Pac, MD;  Location[facility] OR;  Service: Neurosurgery;  Laterality: N/A;  3C/RM 21 to follow   AV FISTULA PLACEMENT Left 02/21/2021   Procedure: INSERTION OF ARTERIOVENOUS GORE-TEX GRAFT LEFT ARM;  Surgeon: Oris Krystal FALCON, MD;  Location: [facility] ORS;  Service: Vascular;  Laterality: Left;   BIOPSY  10/22/2022   Procedure: BIOPSY;  Surgeon: Shaaron Lamar HERO, MD;  Location: [facility] ENDO SUITE;  Service: Endoscopy;;   CATARACT EXTRACTION W/PHACO Right 04/28/2016   Procedure: CATARACT EXTRACTION PHACO AND INTRAOCULAR LENS PLACEMENT (IOC);  Surgeon: Dow FALCON Burke, MD;  Location: [facility] ORS;  Service: Ophthalmology;  Laterality: Right;  CDE: 4.28   COLONOSCOPY     about 2 years in [place] around 2016   CYSTECTOMY     pt. denies   ESOPHAGOGASTRODUODENOSCOPY (EGD) WITH PROPOFOL  N/A 10/22/2022   Procedure: ESOPHAGOGASTRODUODENOSCOPY (EGD) WITH PROPOFOL ;  Surgeon: Shaaron Lamar HERO, MD;  Location: [facility];  Service: Endoscopy;  Laterality: N/A;  12:30 pm, pt know to arrive at 9:15, LM to see if pt can come  earlier   IR FLUORO GUIDE CV LINE LEFT  12/20/2020   IR US  GUIDE VASC ACCESS LEFT  12/20/2020   MALONEY DILATION N/A 10/22/2022   Procedure: AGAPITO DILATION;  Surgeon: Shaaron Lamar HERO, MD;  Location: [facility];  Service: Endoscopy;  Laterality: N/A;   Social History:  reports that he quit smoking about 36 years ago. His smoking use included pipe, cigars, and cigarettes. He started smoking about 69 years ago. He has a 65.9 pack-year smoking history. He has never used smokeless tobacco. He reports that he does not drink alcohol  and does not use drugs.  No Known Allergies  Family History  Problem Relation Age of Onset   Heart disease Mother        cause of death   Diabetes Sister    Colon cancer Neg Hx     Prior to Admission medications   Medication Sig Start Date End Date Taking? Authorizing Provider  aspirin  81 MG chewable tablet Chew 81 mg by mouth daily.   Yes [provider]  gabapentin  (NEURONTIN ) 100 MG capsule Take 100 mg by mouth at bedtime.   Yes [provider]  hydrALAZINE  (APRESOLINE ) 100 MG tablet Take 100 mg by mouth 2 (two) times daily. 08/24/24  Yes [provider]  linagliptin  (TRADJENTA ) 5 MG TABS tablet Take 5 mg by mouth daily.   Yes [provider]  metoprolol  succinate (TOPROL -XL) 50 MG 24 hr tablet Take 50 mg by mouth daily. 03/15/24  Yes [provider]  NIFEdipine  (ADALAT  CC) 90 MG 24 hr tablet Take 90 mg by mouth daily. 03/21/24  Yes [provider]  pantoprazole  (PROTONIX ) 40 MG tablet Take 1 tablet (40 mg total) by mouth daily before breakfast. 12/31/22  Yes Ezzard Sonny RAMAN, PA-C  tamsulosin  (FLOMAX ) 0.4 MG CAPS capsule Take 0.4 mg by mouth daily. 09/02/22  Yes [provider]    Physical Exam: Vitals:   09/07/24 1615 09/07/24 1617 09/07/24 1715 09/07/24 2135  BP: (!) 158/70   (!) 160/60  Pulse: 65 67  63  Resp: 14 16  18   Temp:   98.1 F (36.7 C)   TempSrc:   Oral   SpO2: 97% 97%  99%   Weight:       *** Data Reviewed: {Tip this will not be part of the note when signed- Document your independent interpretation of telemetry tracing, EKG, lab, Radiology test or any other diagnostic tests. Add any new diagnostic test ordered today. (Optional):26781} {Results:26384}  Assessment and Plan: No notes have been filed under this hospital service. Service: Hospitalist     Advance Care Planning:   Code Status: Prior ***  Consults: ***  Family Communication: ***  Severity of Illness: {Observation/Inpatient:21159}  Author: Posey Maier, DO 09/07/2024 9:40 PM  For on call review [email or web address].

## 2024-09-07 NOTE — ED Triage Notes (Signed)
 Pt complains of dizziness that started on Monday. Went to Dialysis yesterday and required extra assistance to get to car. Was advised at that time to come to ER. Denies pain and denies falls. Feels like the room is spinning and right ear ringing. Denies nausea and vomiting

## 2024-09-07 NOTE — ED Provider Notes (Signed)
 [place] EMERGENCY DEPARTMENT AT [facility] Provider Note   CSN: [id number] Arrival date & time: 09/07/24  1136     Patient presents with: Dizziness   Steve [name] is a 88 y.o. male.    Dizziness Associated symptoms: no chest pain, no headaches, no nausea, no shortness of breath and no vomiting        Steve [name] is a 88 y.o. male with past medical history of hypertension, prostate cancer, type 2 diabetes, end-stage renal disease on hemodialysis Tuesday Thursday Saturday and anemia who presents to the Emergency Department for evaluation of dizziness.  States that symptoms began 2 days ago.  Describes having intermittent episodes of dizziness and sensation of movement and spinning with position change.  He went to his dialysis appointment yesterday and got full dialysis treatment, he required assistance to get in and out of the vehicle.  This is new for him.  He states that he uses a walker at baseline.  He also describes having a sensation of fullness or ringing to his right ear.  Denies any chest pain, shortness of breath nausea or vomiting, headache or visual changes..  No recent illness fever or chills.  Prior to Admission medications   Medication Sig Start Date End Date Taking? Authorizing Provider  aspirin  81 MG tablet Take 81 mg by mouth daily.    [provider]  gabapentin  (NEURONTIN ) 100 MG capsule Take 100 mg by mouth daily.    [provider]  hydrALAZINE  (APRESOLINE ) 50 MG tablet Take 1 tablet (50 mg total) by mouth 3 (three) times daily. 03/30/24   Arrien, Elidia Sieving, MD  linagliptin  (TRADJENTA ) 5 MG TABS tablet Take 5 mg by mouth daily.    [provider]  metoprolol  succinate (TOPROL -XL) 50 MG 24 hr tablet Take 50 mg by mouth daily. 03/15/24   [provider]  NIFEdipine  (ADALAT  CC) 90 MG 24 hr tablet Take 90 mg by mouth daily. 03/21/24   [provider]  pantoprazole  (PROTONIX ) 40 MG tablet Take 1 tablet  (40 mg total) by mouth daily before breakfast. 12/31/22   Ezzard Sonny RAMAN, PA-C  tamsulosin  (FLOMAX ) 0.4 MG CAPS capsule Take 0.4 mg by mouth daily. 09/02/22   [provider]    Allergies: Patient has no known allergies.    Review of Systems  Constitutional:  Negative for chills and fever.  HENT:  Negative for congestion.   Eyes:  Negative for visual disturbance.  Respiratory:  Negative for shortness of breath.   Cardiovascular:  Negative for chest pain.  Gastrointestinal:  Negative for abdominal pain, nausea and vomiting.  Musculoskeletal:  Negative for arthralgias and neck pain.  Neurological:  Positive for dizziness. Negative for syncope, numbness and headaches.    Updated Vital Signs BP (!) 158/70   Pulse 67   Temp 98.1 F (36.7 C) (Oral)   Resp 16   Wt 76.2 kg   SpO2 97%   BMI 27.96 kg/m   Physical Exam Vitals and nursing note reviewed.  Constitutional:      General: He is not in acute distress.    Appearance: Normal appearance. He is not ill-appearing or toxic-appearing.  HENT:     Right Ear: Tympanic membrane and ear canal normal.     Left Ear: Tympanic membrane and ear canal normal.     Nose: No congestion.     Mouth/Throat:     Mouth: Mucous membranes are moist.     Pharynx: Oropharynx is clear.  Eyes:  General: Vision grossly intact. Gaze aligned appropriately.     Extraocular Movements: Extraocular movements intact.     Right eye: Nystagmus present.     Left eye: Nystagmus present.     Pupils: Pupils are equal, round, and reactive to light.  Cardiovascular:     Rate and Rhythm: Normal rate and regular rhythm.     Pulses: Normal pulses.  Pulmonary:     Effort: Pulmonary effort is normal.  Chest:     Chest wall: No tenderness.  Abdominal:     Palpations: Abdomen is soft.     Tenderness: There is no abdominal tenderness.  Musculoskeletal:        General: Normal range of motion.     Cervical back: Normal range of motion. No tenderness.   Skin:    General: Skin is warm.     Capillary Refill: Capillary refill takes less than 2 seconds.  Neurological:     Mental Status: He is alert.     Cranial Nerves: No dysarthria or facial asymmetry.     Sensory: Sensation is intact. No sensory deficit.     Motor: No weakness or pronator drift.     Coordination: Coordination is intact. Coordination normal.     Comments: CN II through XII grossly intact.  Speech clear.  Mentating well.  Nml finger nose testing.    Pt able to stand with assistance.      (all labs ordered are listed, but only abnormal results are displayed) Labs Reviewed  COMPREHENSIVE METABOLIC PANEL WITH GFR - Abnormal; Notable for the following components:      Result Value   Glucose, Bld 136 (*)    BUN 31 (*)    Creatinine, Ser 6.63 (*)    GFR, Estimated 8 (*)    All other components within normal limits  CBC - Abnormal; Notable for the following components:   Hemoglobin 11.9 (*)    HCT 35.7 (*)    MCV 79.0 (*)    RDW 17.8 (*)    All other components within normal limits  CBG MONITORING, ED - Abnormal; Notable for the following components:   Glucose-Capillary 134 (*)    All other components within normal limits  RESP PANEL BY RT-PCR (RSV, FLU A&B, COVID)  [id number]  URINALYSIS, ROUTINE W REFLEX MICROSCOPIC  TROPONIN T, HIGH SENSITIVITY    EKG: EKG Interpretation Date/Time:  Wednesday September 07 2024 14:42:16 EST Ventricular Rate:  65 PR Interval:  176 QRS Duration:  104 QT Interval:  429 QTC Calculation: 447 R Axis:   -44  Text Interpretation: Sinus rhythm Left axis deviation Abnormal T, consider ischemia, diffuse leads Baseline wander in lead(s) V2 V5 abnormal compared wtih prior Confirmed by Cleotilde Rogue ([id number]) on 09/07/2024 5:58:26 PM  Radiology: MR BRAIN WO CONTRAST Result Date: 09/07/2024 CLINICAL DATA:  Initial evaluation for acute TIA, dizziness. EXAM: MRI HEAD WITHOUT CONTRAST TECHNIQUE: Multiplanar, multiecho pulse sequences of the  brain and surrounding structures were obtained without intravenous contrast. COMPARISON:  CT from 04/18/2020. FINDINGS: Brain: Cerebral volume within normal limits. Patchy T2/FLAIR hyperintensity involving the periventricular and deep white matter both cerebral hemispheres, consistent with chronic small vessel ischemic disease, mild for age. No evidence for acute or subacute infarct. No areas of chronic cortical infarction. No acute or significant chronic intracranial blood products. No mass lesion, midline shift or mass effect. No hydrocephalus or extra-axial fluid collection. Pituitary gland within normal limits. Vascular: Major intracranial vascular flow voids are maintained. Skull and upper cervical spine:  Craniocervical junction within normal limits. Decreased T1 signal intensity noted within the visualized bone marrow, nonspecific, but most commonly related to anemia, smoking or obesity. Prior ACDF at C3-4 noted. No scalp soft tissue abnormality. Sinuses/Orbits: Prior bilateral ocular lens replacement. Paranasal sinuses are largely clear. No significant mastoid effusion. Other: None. IMPRESSION: 1. No acute intracranial abnormality. 2. Mild chronic microvascular ischemic disease for age. Electronically Signed   By: Morene Hoard M.D.   On: 09/07/2024 17:49     Procedures   Medications Ordered in the ED  sodium chloride  0.9 % bolus 500 mL (500 mLs Intravenous New Bag/Given 09/07/24 1714)  meclizine  (ANTIVERT ) tablet 25 mg (25 mg Oral Given 09/07/24 1618)                                    Medical Decision Making   Hemodialysis patient last dialyzed yesterday, here for intermittent dizziness and sensation of spinning with position change.  Symptoms occurring intermittently for 2 days.  Denies any chest pain, shortness of breath visual changes nausea or vomiting.  He also has some sensation of fullness/tinnitus in his right ear  Differential would include but not limited to vertigo,  viral process, electrolyte imbalance, TIA, stroke  Amount and/or Complexity of Data Reviewed Labs: ordered.    Details: Labs here unremarkable.  Kidney functions are elevated but at baseline and he is hemodialysis patient. Radiology: ordered.    Details: MRI brain without acute intracranial abnormality ECG/medicine tests: ordered.    Details: EKG shows sinus rhythm with a left axis deviation abnormal T waves consider ischemia  Discussion of management or test interpretation with external provider(s):   Patient resting comfortably at this time.  Able to tolerate snack and liquids without difficulty.  Endorses some improvement after meclizine  and small amount of IV fluids.  He does have some concerning ischemic changes on EKG today which are new from previous.  He denies having any chest pain or shortness of breath.  Troponin is pending.  Patient also seen by Dr. Cleotilde and care plan discussed.  Patient will likely require hospital admission.        Final diagnoses:  Dizziness    ED Discharge Orders     None          Herlinda Madelin RIGGERS 09/07/24 1936    Cleotilde Rogue, MD 09/07/24 2126

## 2024-09-08 ENCOUNTER — Encounter (HOSPITAL_COMMUNITY): Payer: Self-pay | Admitting: Internal Medicine

## 2024-09-08 DIAGNOSIS — R42 Dizziness and giddiness: Secondary | ICD-10-CM | POA: Diagnosis not present

## 2024-09-08 DIAGNOSIS — Z992 Dependence on renal dialysis: Secondary | ICD-10-CM

## 2024-09-08 DIAGNOSIS — N186 End stage renal disease: Secondary | ICD-10-CM | POA: Diagnosis not present

## 2024-09-08 LAB — CBC
HCT: 32.7 % — ABNORMAL LOW (ref 39.0–52.0)
Hemoglobin: 10.9 g/dL — ABNORMAL LOW (ref 13.0–17.0)
MCH: 26.1 pg (ref 26.0–34.0)
MCHC: 33.3 g/dL (ref 30.0–36.0)
MCV: 78.4 fL — ABNORMAL LOW (ref 80.0–100.0)
Platelets: 174 K/uL (ref 150–400)
RBC: 4.17 MIL/uL — ABNORMAL LOW (ref 4.22–5.81)
RDW: 17.8 % — ABNORMAL HIGH (ref 11.5–15.5)
WBC: 4.9 K/uL (ref 4.0–10.5)
nRBC: 0 % (ref 0.0–0.2)

## 2024-09-08 LAB — COMPREHENSIVE METABOLIC PANEL WITH GFR
ALT: 10 U/L (ref 0–44)
AST: 18 U/L (ref 15–41)
Albumin: 3.5 g/dL (ref 3.5–5.0)
Alkaline Phosphatase: 86 U/L (ref 38–126)
Anion gap: 11 (ref 5–15)
BUN: 38 mg/dL — ABNORMAL HIGH (ref 8–23)
CO2: 28 mmol/L (ref 22–32)
Calcium: 8.8 mg/dL — ABNORMAL LOW (ref 8.9–10.3)
Chloride: 105 mmol/L (ref 98–111)
Creatinine, Ser: 7.65 mg/dL — ABNORMAL HIGH (ref 0.61–1.24)
GFR, Estimated: 6 mL/min — ABNORMAL LOW (ref >60)
Glucose, Bld: 78 mg/dL (ref 70–99)
Potassium: 3.8 mmol/L (ref 3.5–5.1)
Sodium: 144 mmol/L (ref 135–145)
Total Bilirubin: 0.4 mg/dL (ref 0.0–1.2)
Total Protein: 6.6 g/dL (ref 6.5–8.1)

## 2024-09-08 LAB — PHOSPHORUS: Phosphorus: 2.2 mg/dL — ABNORMAL LOW (ref 2.5–4.6)

## 2024-09-08 LAB — IRON AND TIBC
Iron: 63 ug/dL (ref 45–182)
Saturation Ratios: 28 % (ref 17.9–39.5)
TIBC: 225 ug/dL — ABNORMAL LOW (ref 250–450)
UIBC: 162 ug/dL

## 2024-09-08 LAB — MAGNESIUM: Magnesium: 1.7 mg/dL (ref 1.7–2.4)

## 2024-09-08 LAB — FERRITIN: Ferritin: 962 ng/mL — ABNORMAL HIGH (ref 24–336)

## 2024-09-08 LAB — HEPATITIS B SURFACE ANTIGEN: Hepatitis B Surface Ag: NONREACTIVE

## 2024-09-08 MED ORDER — GABAPENTIN 100 MG PO CAPS
100.0000 mg | ORAL_CAPSULE | Freq: Every day | ORAL | Status: DC
  Administered 2024-09-08 – 2024-09-11 (×4): 100 mg via ORAL
  Filled 2024-09-08 (×4): qty 1

## 2024-09-08 MED ORDER — HYDRALAZINE HCL 100 MG PO TABS: 100.0000 mg | ORAL_TABLET | Freq: Two times a day (BID) | ORAL | Status: DC

## 2024-09-08 MED ORDER — HYDRALAZINE HCL 25 MG PO TABS
50.0000 mg | ORAL_TABLET | Freq: Two times a day (BID) | ORAL | Status: DC
  Administered 2024-09-08 – 2024-09-12 (×7): 50 mg via ORAL
  Filled 2024-09-08 (×8): qty 2

## 2024-09-08 MED ORDER — POLYVINYL ALCOHOL 1.4 % OP SOLN: 1.0000 [drp] | Freq: Every day | OPHTHALMIC | Status: DC | PRN

## 2024-09-08 MED ORDER — CHLORHEXIDINE GLUCONATE CLOTH 2 % EX PADS
6.0000 | MEDICATED_PAD | Freq: Every day | CUTANEOUS | Status: DC
  Administered 2024-09-09 – 2024-09-12 (×4): 6 via TOPICAL

## 2024-09-08 MED ORDER — TAMSULOSIN HCL 0.4 MG PO CAPS
0.4000 mg | ORAL_CAPSULE | Freq: Every day | ORAL | Status: DC
  Administered 2024-09-08 – 2024-09-12 (×4): 0.4 mg via ORAL
  Filled 2024-09-08 (×4): qty 1

## 2024-09-08 MED ORDER — ASPIRIN 81 MG PO CHEW
81.0000 mg | CHEWABLE_TABLET | Freq: Every day | ORAL | Status: DC
  Administered 2024-09-08 – 2024-09-12 (×5): 81 mg via ORAL
  Filled 2024-09-08 (×5): qty 1

## 2024-09-08 MED ORDER — MECLIZINE HCL 12.5 MG PO TABS
25.0000 mg | ORAL_TABLET | Freq: Two times a day (BID) | ORAL | Status: DC | PRN
  Administered 2024-09-08 – 2024-09-12 (×3): 25 mg via ORAL
  Filled 2024-09-08 (×3): qty 2

## 2024-09-08 MED ORDER — METOPROLOL SUCCINATE ER 25 MG PO TB24
12.5000 mg | ORAL_TABLET | Freq: Every day | ORAL | Status: DC
  Administered 2024-09-08 – 2024-09-12 (×3): 12.5 mg via ORAL
  Filled 2024-09-08 (×5): qty 1

## 2024-09-08 MED ORDER — NIFEDIPINE ER OSMOTIC RELEASE 30 MG PO TB24: 90.0000 mg | ORAL_TABLET | Freq: Every day | ORAL | Status: DC

## 2024-09-08 MED ORDER — METOPROLOL SUCCINATE ER 50 MG PO TB24: 50.0000 mg | ORAL_TABLET | Freq: Every day | ORAL | Status: DC

## 2024-09-08 MED ORDER — PANTOPRAZOLE SODIUM 40 MG PO TBEC
40.0000 mg | DELAYED_RELEASE_TABLET | Freq: Every day | ORAL | Status: DC
  Administered 2024-09-08 – 2024-09-12 (×5): 40 mg via ORAL
  Filled 2024-09-08 (×5): qty 1

## 2024-09-08 MED ORDER — NIFEDIPINE ER OSMOTIC RELEASE 30 MG PO TB24
30.0000 mg | ORAL_TABLET | Freq: Every day | ORAL | Status: DC
  Administered 2024-09-08 – 2024-09-12 (×4): 30 mg via ORAL
  Filled 2024-09-08 (×5): qty 1

## 2024-09-08 MED ORDER — LINAGLIPTIN 5 MG PO TABS
5.0000 mg | ORAL_TABLET | Freq: Every day | ORAL | Status: DC
  Administered 2024-09-08 – 2024-09-12 (×5): 5 mg via ORAL
  Filled 2024-09-08 (×5): qty 1

## 2024-09-08 NOTE — Progress Notes (Addendum)
 PROGRESS NOTE   Steve [name]  FMW:[id number] DOB: 29-Jun-1936 DOA: 09/07/2024 PCP: Carlette Benita Area, MD   Chief Complaint  Patient presents with   Dizziness   Level of care: Telemetry  Brief Admission History:  88 y.o. male with medical history significant of hypertension, hyperlipidemia, T2DM, ESRD (TTS), GERD, prostate cancer who presents to the emergency department due to 2-day onset of intermittent episodes of dizziness which presents as room spinning with position change, patient also complained of sensation of fullness or ringing in his right ear.  Last dialysis was  (11/25) and he had full dialysis, however, he needed help to be able to get in and out of the vehicle, this is a change from baseline however a uses a walker without any other assistive device.  He denies fever, chills, chest pain, shortness of breath, nausea or vomiting. He reports no prior history of vertigo or dizziness like this.     ED course In the emergency department, BP was 121/58, other vital signs were within normal range.  Workup in the ED showed microcytic anemia.  BMP was normal except for blood glucose of 136, BUN 31, creatinine 6.63, eGFR 8.  Troponin 80 > 84 > 83.  Urinalysis was normal.  Influenza A, B, SARS coronavirus 2, RSV was negative.  MRI brain without contrast showed no acute intracranial abnormality.    Assessment and Plan:  Dizziness Question of BPPV Continue meclizine  PRN Continue PT/OT eval and treat for vestibular evaluation Pt had another episode this morning  Added fall precautions Added neuro checks Added orthostatic vitals Q shift Added carotid doppler study  Generalized weakness Continue management as described above  Sinus bradycardia  -- isolated HRs in the 40-50 range seen on tele -- we have reduced his metoprolol  succinate to 12.5 mg down from 50 mg with hold parameters for HR<65 or SBP<110  HTN -- we have reduced his BP meds due to noticing softer BPs as well as  bradycardia to be sure this isn't causing his symptoms---see orders   Elevated troponin possibly secondary to type II demand ischemia Troponin 80 > 84 > 83.  Patient denies chest pain Continue telemetry Cardiology was consulted but not available on this campus until 09/12/24.  He has no chest pain symptoms.  I feel ACS is ruled out.  Monitor for now.    Microcytic anemia/anemia of chronic disease MCV 79, iron  studies will be done   ESRD on HD (TTS) Last dialysis was yesterday (11/25) Nephrology consulted with plan for HD tomorrow, confirmed with Dr. Tobie   Type 2 diabetes mellitus Hemoglobin A1c on 03/29/2024 was 5.1 Continue diet modification at this time Repeat A1c pending CBG (last 3)  Recent Labs    09/07/24 1215  GLUCAP 134*    DVT prophylaxis: sq heparin  Code Status: Full  Family Communication:  Disposition: TBD    Consultants:   Procedures:   Antimicrobials:    Subjective: Pt had another episode of dizziness this morning while ambulating to bathroom, felt the room was spinning, lasted a few minutes.   Objective: Vitals:   09/07/24 2135 09/07/24 2145 09/07/24 2227 09/08/24 0349  BP: (!) 160/60 (!) 172/65 (!) 190/68 (!) 162/63  Pulse: 63 64 63 65  Resp: 18 16 17 18   Temp:   97.8 F (36.6 C) 98.4 F (36.9 C)  TempSrc:   Oral Oral  SpO2: 99% 99% 100% 96%  Weight:        Intake/Output Summary (Last 24 hours) at 09/08/2024 1304  Last data filed at 09/08/2024 0943 Gross per 24 hour  Intake 620 ml  Output --  Net 620 ml   Filed Weights   09/07/24 1209  Weight: 76.2 kg   Examination:  General exam: Appears calm and comfortable  Respiratory system: Clear to auscultation. Respiratory effort normal. Cardiovascular system: normal S1 & S2 heard. No JVD, murmurs, rubs, gallops or clicks. No pedal edema. Gastrointestinal system: Abdomen is nondistended, soft and nontender. No organomegaly or masses felt. Normal bowel sounds heard. Central nervous system:  Alert and oriented. No focal neurological deficits. Extremities: Symmetric 5 x 5 power. Skin: No rashes, lesions or ulcers. Psychiatry: Judgement and insight appear normal. Mood & affect appropriate.   Data Reviewed: I have personally reviewed following labs and imaging studies  CBC: Recent Labs  Lab 09/07/24 1254 09/08/24 0443  WBC 6.5 4.9  HGB 11.9* 10.9*  HCT 35.7* 32.7*  MCV 79.0* 78.4*  PLT 205 174    Basic Metabolic Panel: Recent Labs  Lab 09/07/24 1254 09/07/24 2247 09/08/24 0443  NA 140  --  144  K 3.7  --  3.8  CL 99  --  105  CO2 28  --  28  GLUCOSE 136*  --  78  BUN 31*  --  38*  CREATININE 6.63*  --  7.65*  CALCIUM 9.4  --  8.8*  MG  --  1.7  --   PHOS  --  2.2*  --     CBG: Recent Labs  Lab 09/07/24 1215  GLUCAP 134*    Recent Results (from the past 240 hours)  Resp panel by RT-PCR (RSV, Flu A&B, Covid) Anterior Nasal Swab     Status: None   Collection Time: 09/07/24  2:44 PM   Specimen: Anterior Nasal Swab  Result Value Ref Range Status   SARS Coronavirus 2 by RT PCR NEGATIVE NEGATIVE Final    Comment: (NOTE) SARS-CoV-2 target nucleic acids are NOT DETECTED.  The SARS-CoV-2 RNA is generally detectable in upper respiratory specimens during the acute phase of infection. The lowest concentration of SARS-CoV-2 viral copies this assay can detect is 138 copies/mL. A negative result does not preclude SARS-Cov-2 infection and should not be used as the sole basis for treatment or other patient management decisions. A negative result may occur with  improper specimen collection/handling, submission of specimen other than nasopharyngeal swab, presence of viral mutation(s) within the areas targeted by this assay, and inadequate number of viral copies(<138 copies/mL). A negative result must be combined with clinical observations, patient history, and epidemiological information. The expected result is Negative.  Fact Sheet for Patients:   bloggercourse.com  Fact Sheet for Healthcare Providers:  seriousbroker.it  This test is no t yet approved or cleared by the [place]  FDA and  has been authorized for detection and/or diagnosis of SARS-CoV-2 by FDA under an Emergency Use Authorization (EUA). This EUA will remain  in effect (meaning this test can be used) for the duration of the COVID-19 declaration under Section 564(b)(1) of the Act, 21 U.S.C.section 360bbb-3(b)(1), unless the authorization is terminated  or revoked sooner.       Influenza A by PCR NEGATIVE NEGATIVE Final   Influenza B by PCR NEGATIVE NEGATIVE Final    Comment: (NOTE) The Xpert Xpress SARS-CoV-2/FLU/RSV plus assay is intended as an aid in the diagnosis of influenza from Nasopharyngeal swab specimens and should not be used as a sole basis for treatment. Nasal washings and aspirates are unacceptable for Xpert Xpress  SARS-CoV-2/FLU/RSV testing.  Fact Sheet for Patients: bloggercourse.com  Fact Sheet for Healthcare Providers: seriousbroker.it  This test is not yet approved or cleared by the [place]  FDA and has been authorized for detection and/or diagnosis of SARS-CoV-2 by FDA under an Emergency Use Authorization (EUA). This EUA will remain in effect (meaning this test can be used) for the duration of the COVID-19 declaration under Section 564(b)(1) of the Act, 21 U.S.C. section 360bbb-3(b)(1), unless the authorization is terminated or revoked.     Resp Syncytial Virus by PCR NEGATIVE NEGATIVE Final    Comment: (NOTE) Fact Sheet for Patients: bloggercourse.com  Fact Sheet for Healthcare Providers: seriousbroker.it  This test is not yet approved or cleared by the [place]  FDA and has been authorized for detection and/or diagnosis of SARS-CoV-2 by FDA under an Emergency Use  Authorization (EUA). This EUA will remain in effect (meaning this test can be used) for the duration of the COVID-19 declaration under Section 564(b)(1) of the Act, 21 U.S.C. section 360bbb-3(b)(1), unless the authorization is terminated or revoked.  Performed at [facility], [address], [place], [place] [address]      Radiology Studies: MR BRAIN WO CONTRAST Result Date: 09/07/2024 CLINICAL DATA:  Initial evaluation for acute TIA, dizziness. EXAM: MRI HEAD WITHOUT CONTRAST TECHNIQUE: Multiplanar, multiecho pulse sequences of the brain and surrounding structures were obtained without intravenous contrast. COMPARISON:  CT from 04/18/2020. FINDINGS: Brain: Cerebral volume within normal limits. Patchy T2/FLAIR hyperintensity involving the periventricular and deep white matter both cerebral hemispheres, consistent with chronic small vessel ischemic disease, mild for age. No evidence for acute or subacute infarct. No areas of chronic cortical infarction. No acute or significant chronic intracranial blood products. No mass lesion, midline shift or mass effect. No hydrocephalus or extra-axial fluid collection. Pituitary gland within normal limits. Vascular: Major intracranial vascular flow voids are maintained. Skull and upper cervical spine: Craniocervical junction within normal limits. Decreased T1 signal intensity noted within the visualized bone marrow, nonspecific, but most commonly related to anemia, smoking or obesity. Prior ACDF at C3-4 noted. No scalp soft tissue abnormality. Sinuses/Orbits: Prior bilateral ocular lens replacement. Paranasal sinuses are largely clear. No significant mastoid effusion. Other: None. IMPRESSION: 1. No acute intracranial abnormality. 2. Mild chronic microvascular ischemic disease for age. Electronically Signed   By: Morene Hoard M.D.   On: 09/07/2024 17:49   Scheduled Meds:  Chlorhexidine  Gluconate Cloth  6 each Topical Q0600   heparin   5,000 Units  Subcutaneous Q8H   Continuous Infusions:   LOS: 0 days   Time spent: 55 mins  Mercia Dowe Vicci, MD How to contact the [facility] Attending or Consulting provider 7A - 7P or covering provider during after hours 7P -7A, for this patient?  Check the care team in [facility] and look for a) attending/consulting TRH provider listed and b) the TRH team listed Log into [email or web address] to find provider on call.  Locate the TRH provider you are looking for under [facility] and page to a number that you can be directly reached. If you still have difficulty reaching the provider, please page the [facility] (Director on Call) for the Hospitalists listed on amion for assistance.  09/08/2024, 1:04 PM

## 2024-09-08 NOTE — TOC Progression Note (Signed)
   Transition of Care [facility]) - Inpatient Brief Assessment   Patient Details  Name: Steve [name] MRN: [id number] Date of Birth: Apr 02, 1936  Transition of Care [facility]) CM/SW Contact:    Sharlyne Stabs, RN Phone Number: 09/08/2024, 12:20 PM   Clinical Narrative:  Patient admitted in OBS with dizziness, work up continues, PT eval pending due to holiday. Team deciding to treat here vs transfer to [facility]. IPCM following.   Transition of Care Asessment: Insurance and Status: Insurance coverage has been reviewed Patient has primary care physician: Yes Home environment has been reviewed: Home Prior level of function:: Independent Prior/Current Home Services: No current home services Social Drivers of Health Review: SDOH reviewed no interventions necessary Readmission risk has been reviewed: Yes Transition of care needs: no transition of care needs at this time      Social Drivers of Health (SDOH) Interventions SDOH Screenings   Food Insecurity: No Food Insecurity (09/07/2024)  Housing: Low Risk  (09/07/2024)  Transportation Needs: No Transportation Needs (09/07/2024)  Utilities: Not At Risk (09/07/2024)  Depression (PHQ2-9): Low Risk  (05/12/2019)  Social Connections: Socially Isolated (09/07/2024)  Tobacco Use: Medium Risk (03/29/2024)

## 2024-09-08 NOTE — Hospital Course (Signed)
 88 y.o. male with medical history significant of hypertension, hyperlipidemia, T2DM, ESRD (TTS), GERD, prostate cancer who presents to the emergency department due to 2-day onset of intermittent episodes of dizziness which presents as room spinning with position change, patient also complained of sensation of fullness or ringing in his right ear.  Last dialysis was  (11/25) and he had full dialysis, however, he needed help to be able to get in and out of the vehicle, this is a change from baseline however a uses a walker without any other assistive device.  He denies fever, chills, chest pain, shortness of breath, nausea or vomiting. He reports no prior history of vertigo or dizziness like this.     ED course In the emergency department, BP was 121/58, other vital signs were within normal range.  Workup in the ED showed microcytic anemia.  BMP was normal except for blood glucose of 136, BUN 31, creatinine 6.63, eGFR 8.  Troponin 80 > 84 > 83.  Urinalysis was normal.  Influenza A, B, SARS coronavirus 2, RSV was negative.  MRI brain without contrast showed no acute intracranial abnormality.

## 2024-09-08 NOTE — Care Management Obs Status (Signed)
 MEDICARE OBSERVATION STATUS NOTIFICATION   Patient Details  Name: Steve [name] MRN: [id number] Date of Birth: April 28, 1936   Medicare Observation Status Notification Given:  Yes    Sharlyne Stabs, RN 09/08/2024, 12:00 PM

## 2024-09-08 NOTE — Progress Notes (Signed)
 Patient ID: Steve [name], male   DOB: 06/29/1936, 88 y.o.   MRN: [id number]  Consulted by Dr. Vicci for continuity of ESRD care for Steve [name] who is on hemodialysis on a TTS schedule.  No acute indications for dialysis today based on review of labs/clinical assessment by Dr. Vicci.  He will have a formal nephrology consultation tomorrow.  Dialysis orders placed.  Gordy Blanch MD [facility] Office # [phone] Pager # [phone] 8:03 AM

## 2024-09-09 ENCOUNTER — Observation Stay (HOSPITAL_COMMUNITY)

## 2024-09-09 ENCOUNTER — Other Ambulatory Visit (HOSPITAL_COMMUNITY): Payer: Self-pay | Admitting: *Deleted

## 2024-09-09 DIAGNOSIS — R9431 Abnormal electrocardiogram [ECG] [EKG]: Secondary | ICD-10-CM | POA: Diagnosis not present

## 2024-09-09 DIAGNOSIS — N186 End stage renal disease: Secondary | ICD-10-CM | POA: Diagnosis not present

## 2024-09-09 DIAGNOSIS — I10 Essential (primary) hypertension: Secondary | ICD-10-CM | POA: Diagnosis not present

## 2024-09-09 DIAGNOSIS — Z992 Dependence on renal dialysis: Secondary | ICD-10-CM | POA: Diagnosis not present

## 2024-09-09 DIAGNOSIS — R42 Dizziness and giddiness: Secondary | ICD-10-CM | POA: Diagnosis not present

## 2024-09-09 LAB — LIPID PANEL
Cholesterol: 168 mg/dL (ref 0–200)
HDL: 46 mg/dL (ref >40)
LDL Cholesterol: 104 mg/dL — ABNORMAL HIGH (ref 0–99)
Total CHOL/HDL Ratio: 3.7 ratio
Triglycerides: 93 mg/dL (ref <150)
VLDL: 19 mg/dL (ref 0–40)

## 2024-09-09 LAB — CBC
HCT: 33.3 % — ABNORMAL LOW (ref 39.0–52.0)
Hemoglobin: 11 g/dL — ABNORMAL LOW (ref 13.0–17.0)
MCH: 26.1 pg (ref 26.0–34.0)
MCHC: 33 g/dL (ref 30.0–36.0)
MCV: 79.1 fL — ABNORMAL LOW (ref 80.0–100.0)
Platelets: 173 K/uL (ref 150–400)
RBC: 4.21 MIL/uL — ABNORMAL LOW (ref 4.22–5.81)
RDW: 17.6 % — ABNORMAL HIGH (ref 11.5–15.5)
WBC: 5.9 K/uL (ref 4.0–10.5)
nRBC: 0 % (ref 0.0–0.2)

## 2024-09-09 LAB — RENAL FUNCTION PANEL
Albumin: 3.6 g/dL (ref 3.5–5.0)
Anion gap: 12 (ref 5–15)
BUN: 46 mg/dL — ABNORMAL HIGH (ref 8–23)
CO2: 25 mmol/L (ref 22–32)
Calcium: 8.7 mg/dL — ABNORMAL LOW (ref 8.9–10.3)
Chloride: 107 mmol/L (ref 98–111)
Creatinine, Ser: 8.94 mg/dL — ABNORMAL HIGH (ref 0.61–1.24)
GFR, Estimated: 5 mL/min — ABNORMAL LOW (ref >60)
Glucose, Bld: 80 mg/dL (ref 70–99)
Phosphorus: 2.9 mg/dL (ref 2.5–4.6)
Potassium: 4 mmol/L (ref 3.5–5.1)
Sodium: 143 mmol/L (ref 135–145)

## 2024-09-09 LAB — HEMOGLOBIN A1C
Hgb A1c MFr Bld: 5.8 % — ABNORMAL HIGH (ref 4.8–5.6)
Mean Plasma Glucose: 120 mg/dL

## 2024-09-09 LAB — MAGNESIUM: Magnesium: 1.7 mg/dL (ref 1.7–2.4)

## 2024-09-09 MED ORDER — ANTICOAGULANT SODIUM CITRATE 4% (200MG/5ML) IV SOLN: 5.0000 mL | Status: DC | PRN

## 2024-09-09 MED ORDER — MAGNESIUM SULFATE 2 GM/50ML IV SOLN
2.0000 g | Freq: Once | INTRAVENOUS | Status: AC
  Administered 2024-09-09: 2 g via INTRAVENOUS
  Filled 2024-09-09: qty 50

## 2024-09-09 MED ORDER — LIDOCAINE-PRILOCAINE 2.5-2.5 % EX CREA: 1.0000 | TOPICAL_CREAM | CUTANEOUS | Status: DC | PRN

## 2024-09-09 MED ORDER — ALTEPLASE 2 MG IJ SOLR: 2.0000 mg | Freq: Once | INTRAMUSCULAR | Status: DC | PRN

## 2024-09-09 MED ORDER — LIDOCAINE HCL (PF) 1 % IJ SOLN: 5.0000 mL | INTRAMUSCULAR | Status: DC | PRN

## 2024-09-09 MED ORDER — PENTAFLUOROPROP-TETRAFLUOROETH EX AERO: 1.0000 | INHALATION_SPRAY | CUTANEOUS | Status: DC | PRN

## 2024-09-09 MED ORDER — HEPARIN SODIUM (PORCINE) 1000 UNIT/ML DIALYSIS
100.0000 [IU]/kg | INTRAMUSCULAR | Status: DC | PRN
Start: 2024-09-09 — End: 2024-09-09

## 2024-09-09 MED ORDER — HEPARIN SODIUM (PORCINE) 1000 UNIT/ML DIALYSIS: 1000.0000 [IU] | INTRAMUSCULAR | Status: DC | PRN

## 2024-09-09 NOTE — Consult Note (Addendum)
 [facility]  INPATIENT CONSULTATION  Reason for Consultation: ESRD Requesting Provider: Dr. Vicci  HPI: Steve [name] is an 88 y.o. male with ESRD on HD TTS, HTN, HL, h/o prostate ca, DM currently admitted with dizziness and nephrology is consulted for evaluation and management of ESRD and assoc conditions.   Admitted 11/26 with dizziness with fairly normal w/u - normal imaging.  HR in 40s so BB dose has been reduced but he's been mainly hypertensive. PT has been consulted for vestibular maneuvers.  He currently is feeling ok.  Having off/on vertigo symptoms still.  Currently undergoing carotid US .  Denies CP, dyspnea, edema, issues with L forearm AVG.  PMH: Past Medical History:  Diagnosis Date   Anemia    Arthritis    ESRD (end stage renal disease) on dialysis (HCC)    T/TH/Sat   Essential hypertension    GERD (gastroesophageal reflux disease)    Hyperlipidemia    Prostate cancer (HCC)    took radiation   Type 2 diabetes mellitus (HCC)    PSH: Past Surgical History:  Procedure Laterality Date   ANTERIOR CERVICAL DECOMP/DISCECTOMY FUSION N/A 08/23/2020   Procedure: Cervical 3-4 Anterior cervical decompression/discectomy/fusion;  Surgeon: Unice Pac, MD;  Location: [facility] OR;  Service: Neurosurgery;  Laterality: N/A;  3C/RM 21 to follow   AV FISTULA PLACEMENT Left 02/21/2021   Procedure: INSERTION OF ARTERIOVENOUS GORE-TEX GRAFT LEFT ARM;  Surgeon: Oris Krystal FALCON, MD;  Location: [facility] ORS;  Service: Vascular;  Laterality: Left;   BIOPSY  10/22/2022   Procedure: BIOPSY;  Surgeon: Shaaron Lamar HERO, MD;  Location: [facility] ENDO SUITE;  Service: Endoscopy;;   CATARACT EXTRACTION W/PHACO Right 04/28/2016   Procedure: CATARACT EXTRACTION PHACO AND INTRAOCULAR LENS PLACEMENT (IOC);  Surgeon: Dow FALCON Burke, MD;  Location: [facility] ORS;  Service: Ophthalmology;  Laterality: Right;  CDE: 4.28   COLONOSCOPY     about 2 years in [place] around 2016   CYSTECTOMY     pt. denies    ESOPHAGOGASTRODUODENOSCOPY (EGD) WITH PROPOFOL  N/A 10/22/2022   Procedure: ESOPHAGOGASTRODUODENOSCOPY (EGD) WITH PROPOFOL ;  Surgeon: Shaaron Lamar HERO, MD;  Location: [facility];  Service: Endoscopy;  Laterality: N/A;  12:30 pm, pt know to arrive at 9:15, LM to see if pt can come earlier   IR FLUORO GUIDE CV LINE LEFT  12/20/2020   IR US  GUIDE VASC ACCESS LEFT  12/20/2020   MALONEY DILATION N/A 10/22/2022   Procedure: AGAPITO DILATION;  Surgeon: Shaaron Lamar HERO, MD;  Location: [facility];  Service: Endoscopy;  Laterality: N/A;    Past Medical History:  Diagnosis Date   Anemia    Arthritis    ESRD (end stage renal disease) on dialysis (HCC)    T/TH/Sat   Essential hypertension    GERD (gastroesophageal reflux disease)    Hyperlipidemia    Prostate cancer (HCC)    took radiation   Type 2 diabetes mellitus (HCC)     Medications:  I have reviewed the patient's current medications.  Medications Prior to Admission  Medication Sig Dispense Refill   acetaminophen  (TYLENOL ) 500 MG tablet Take 1,000 mg by mouth every 6 (six) hours as needed for mild pain (pain score 1-3).     aspirin  81 MG chewable tablet Chew 81 mg by mouth daily.     Carboxymethylcellulose Sodium (DRY EYE RELIEF OP) Place 2 drops into both eyes daily as needed (dry eyes).     gabapentin  (NEURONTIN ) 100 MG capsule Take 100 mg by mouth at  bedtime.     hydrALAZINE  (APRESOLINE ) 100 MG tablet Take 100 mg by mouth 2 (two) times daily.     linagliptin  (TRADJENTA ) 5 MG TABS tablet Take 5 mg by mouth daily.     metoprolol  succinate (TOPROL -XL) 50 MG 24 hr tablet Take 50 mg by mouth daily.     NIFEdipine  (ADALAT  CC) 90 MG 24 hr tablet Take 90 mg by mouth daily.     pantoprazole  (PROTONIX ) 40 MG tablet Take 1 tablet (40 mg total) by mouth daily before breakfast. 30 tablet 11   Pseudoeph-Doxylamine-DM-APAP (NYQUIL PO) Take 10 mLs by mouth 2 (two) times a week. At bedtime to maintain cold symptoms     tamsulosin  (FLOMAX ) 0.4 MG  CAPS capsule Take 0.4 mg by mouth daily.     traZODone (DESYREL) 50 MG tablet Take 50 mg by mouth at bedtime as needed for sleep.      ALLERGIES:  No Known Allergies  FAM HX: Family History  Problem Relation Age of Onset   Heart disease Mother        cause of death   Diabetes Sister    Colon cancer Neg Hx     Social History:   reports that he quit smoking about 36 years ago. His smoking use included pipe, cigars, and cigarettes. He started smoking about 69 years ago. He has a 65.9 pack-year smoking history. He has never used smokeless tobacco. He reports that he does not drink alcohol  and does not use drugs.  ROS: 12 system ROS neg except per HPI above  Blood pressure (!) 172/76, pulse 68, temperature 98.5 F (36.9 C), temperature source Oral, resp. rate 19, weight 76.2 kg, SpO2 96%. PHYSICAL EXAM: General: well-appearing, no acute distress HEENT: anicteric sclera, MMM CV: normal rate, no murmurs, no edema Lungs: clear Abd: soft, non-tender, non-distended Skin: no visible lesions or rashes Ext: no ankle edema b/l Neuro: normal speech, no gross focal deficits  Dialysis access: Lt FAL (AT) +b/t   Results for orders placed or performed during the hospital encounter of 09/07/24 (from the past 48 hours)  CBG monitoring, ED     Status: Abnormal   Collection Time: 09/07/24 12:15 PM  Result Value Ref Range   Glucose-Capillary 134 (H) 70 - 99 mg/dL    Comment: Glucose reference range applies only to samples taken after fasting for at least 8 hours.  Comprehensive metabolic panel     Status: Abnormal   Collection Time: 09/07/24 12:54 PM  Result Value Ref Range   Sodium 140 135 - 145 mmol/L   Potassium 3.7 3.5 - 5.1 mmol/L   Chloride 99 98 - 111 mmol/L   CO2 28 22 - 32 mmol/L   Glucose, Bld 136 (H) 70 - 99 mg/dL    Comment: Glucose reference range applies only to samples taken after fasting for at least 8 hours.   BUN 31 (H) 8 - 23 mg/dL   Creatinine, Ser 3.36 (H) 0.61 - 1.24  mg/dL   Calcium 9.4 8.9 - 89.6 mg/dL   Total Protein 7.9 6.5 - 8.1 g/dL   Albumin 4.3 3.5 - 5.0 g/dL   AST 24 15 - 41 U/L   ALT 11 0 - 44 U/L   Alkaline Phosphatase 102 38 - 126 U/L   Total Bilirubin 0.4 0.0 - 1.2 mg/dL   GFR, Estimated 8 (L) >60 mL/min    Comment: (NOTE) Calculated using the CKD-EPI Creatinine Equation (2021)    Anion gap 13 5 - 15  Comment: Performed at [facility], [address], [place], [place] [address]  CBC     Status: Abnormal   Collection Time: 09/07/24 12:54 PM  Result Value Ref Range   WBC 6.5 4.0 - 10.5 K/uL   RBC 4.52 4.22 - 5.81 MIL/uL   Hemoglobin 11.9 (L) 13.0 - 17.0 g/dL   HCT 64.2 (L) 60.9 - 47.9 %   MCV 79.0 (L) 80.0 - 100.0 fL   MCH 26.3 26.0 - 34.0 pg   MCHC 33.3 30.0 - 36.0 g/dL   RDW 82.1 (H) 88.4 - 84.4 %   Platelets 205 150 - 400 K/uL   nRBC 0.0 0.0 - 0.2 %    Comment: Performed at [facility], [address], [place], [place] [address]  Hemoglobin A1c     Status: Abnormal   Collection Time: 09/07/24 12:54 PM  Result Value Ref Range   Hgb A1c MFr Bld 5.8 (H) 4.8 - 5.6 %    Comment: (NOTE)         Prediabetes: 5.7 - 6.4         Diabetes: >6.4         Glycemic control for adults with diabetes: <7.0    Mean Plasma Glucose 120 mg/dL    Comment: (NOTE) Performed At: [facility] [address] [place], [place] [id number] Jennette Shorter MD Ey:[id number]   Resp panel by RT-PCR (RSV, Flu A&B, Covid) Anterior Nasal Swab     Status: None   Collection Time: 09/07/24  2:44 PM   Specimen: Anterior Nasal Swab  Result Value Ref Range   SARS Coronavirus 2 by RT PCR NEGATIVE NEGATIVE    Comment: (NOTE) SARS-CoV-2 target nucleic acids are NOT DETECTED.  The SARS-CoV-2 RNA is generally detectable in upper respiratory specimens during the acute phase of infection. The lowest concentration of SARS-CoV-2 viral copies this assay can detect is 138 copies/mL. A negative result does not preclude SARS-Cov-2 infection and should not be  used as the sole basis for treatment or other patient management decisions. A negative result may occur with  improper specimen collection/handling, submission of specimen other than nasopharyngeal swab, presence of viral mutation(s) within the areas targeted by this assay, and inadequate number of viral copies(<138 copies/mL). A negative result must be combined with clinical observations, patient history, and epidemiological information. The expected result is Negative.  Fact Sheet for Patients:  bloggercourse.com  Fact Sheet for Healthcare Providers:  seriousbroker.it  This test is no t yet approved or cleared by the [place]  FDA and  has been authorized for detection and/or diagnosis of SARS-CoV-2 by FDA under an Emergency Use Authorization (EUA). This EUA will remain  in effect (meaning this test can be used) for the duration of the COVID-19 declaration under Section 564(b)(1) of the Act, 21 U.S.C.section 360bbb-3(b)(1), unless the authorization is terminated  or revoked sooner.       Influenza A by PCR NEGATIVE NEGATIVE   Influenza B by PCR NEGATIVE NEGATIVE    Comment: (NOTE) The Xpert Xpress SARS-CoV-2/FLU/RSV plus assay is intended as an aid in the diagnosis of influenza from Nasopharyngeal swab specimens and should not be used as a sole basis for treatment. Nasal washings and aspirates are unacceptable for Xpert Xpress SARS-CoV-2/FLU/RSV testing.  Fact Sheet for Patients: bloggercourse.com  Fact Sheet for Healthcare Providers: seriousbroker.it  This test is not yet approved or cleared by the [place]  FDA and has been authorized for detection and/or diagnosis of SARS-CoV-2 by FDA under an Emergency  Use Authorization (EUA). This EUA will remain in effect (meaning this test can be used) for the duration of the COVID-19 declaration under Section 564(b)(1) of the  Act, 21 U.S.C. section 360bbb-3(b)(1), unless the authorization is terminated or revoked.     Resp Syncytial Virus by PCR NEGATIVE NEGATIVE    Comment: (NOTE) Fact Sheet for Patients: bloggercourse.com  Fact Sheet for Healthcare Providers: seriousbroker.it  This test is not yet approved or cleared by the [place]  FDA and has been authorized for detection and/or diagnosis of SARS-CoV-2 by FDA under an Emergency Use Authorization (EUA). This EUA will remain in effect (meaning this test can be used) for the duration of the COVID-19 declaration under Section 564(b)(1) of the Act, 21 U.S.C. section 360bbb-3(b)(1), unless the authorization is terminated or revoked.  Performed at [facility], [address], [place], [place] [address]   Troponin T, High Sensitivity     Status: Abnormal   Collection Time: 09/07/24  7:27 PM  Result Value Ref Range   Troponin T High Sensitivity 80 (H) 0 - 19 ng/L    Comment: (NOTE) Biotin concentrations > 1000 ng/mL falsely decrease TnT results.  Serial cardiac troponin measurements are suggested.  Refer to the Links section for chest pain algorithms and additional  guidance. Performed at [facility], [address], [place], [place] [address]   Urinalysis, Routine w reflex microscopic -Urine, Clean Catch     Status: Abnormal   Collection Time: 09/07/24  8:27 PM  Result Value Ref Range   Color, Urine YELLOW YELLOW   APPearance CLEAR CLEAR   Specific Gravity, Urine 1.012 1.005 - 1.030   pH 7.0 5.0 - 8.0   Glucose, UA 50 (A) NEGATIVE mg/dL   Hgb urine dipstick NEGATIVE NEGATIVE   Bilirubin Urine NEGATIVE NEGATIVE   Ketones, ur NEGATIVE NEGATIVE mg/dL   Protein, ur 899 (A) NEGATIVE mg/dL   Nitrite NEGATIVE NEGATIVE   Leukocytes,Ua NEGATIVE NEGATIVE   RBC / HPF 0-5 0 - 5 RBC/hpf   WBC, UA 6-10 0 - 5 WBC/hpf   Bacteria, UA NONE SEEN NONE SEEN   Squamous Epithelial / HPF 0-5 0 - 5 /HPF     Comment: Performed at [facility], [address] [place], [place] [address]  Troponin T, High Sensitivity     Status: Abnormal   Collection Time: 09/07/24  9:07 PM  Result Value Ref Range   Troponin T High Sensitivity 84 (H) 0 - 19 ng/L    Comment: (NOTE) Biotin concentrations > 1000 ng/mL falsely decrease TnT results.  Serial cardiac troponin measurements are suggested.  Refer to the Links section for chest pain algorithms and additional  guidance. Performed at [facility], [address] [place], [place] [address]   Troponin T, High Sensitivity     Status: Abnormal   Collection Time: 09/07/24 10:47 PM  Result Value Ref Range   Troponin T High Sensitivity 83 (H) 0 - 19 ng/L    Comment: (NOTE) Biotin concentrations > 1000 ng/mL falsely decrease TnT results.  Serial cardiac troponin measurements are suggested.  Refer to the Links section for chest pain algorithms and additional  guidance. Performed at [facility], [address] [place], [place] [address]   Magnesium     Status: None   Collection Time: 09/07/24 10:47 PM  Result Value Ref Range   Magnesium 1.7 1.7 - 2.4 mg/dL    Comment: Performed at [facility], [address] [place], [place] [address]  Phosphorus  Status: Abnormal   Collection Time: 09/07/24 10:47 PM  Result Value Ref Range   Phosphorus 2.2 (L) 2.5 - 4.6 mg/dL    Comment: Performed at [facility], [address] [place], [place] [address]  Ferritin     Status: Abnormal   Collection Time: 09/07/24 10:47 PM  Result Value Ref Range   Ferritin 962 (H) 24 - 336 ng/mL    Comment: Performed at [facility], [address] [place], [place] [address]  Iron  and TIBC     Status: Abnormal   Collection Time: 09/07/24 10:47 PM  Result Value Ref Range   Iron  63 45 - 182 ug/dL   TIBC 774 (L) 749 - 549 ug/dL   Saturation Ratios 28 17.9 - 39.5 %   UIBC 162 ug/dL    Comment: Performed at [facility], [address] [place], [place] [address]  CBC     Status:  Abnormal   Collection Time: 09/08/24  4:43 AM  Result Value Ref Range   WBC 4.9 4.0 - 10.5 K/uL   RBC 4.17 (L) 4.22 - 5.81 MIL/uL   Hemoglobin 10.9 (L) 13.0 - 17.0 g/dL   HCT 67.2 (L) 60.9 - 47.9 %   MCV 78.4 (L) 80.0 - 100.0 fL   MCH 26.1 26.0 - 34.0 pg   MCHC 33.3 30.0 - 36.0 g/dL   RDW 82.1 (H) 88.4 - 84.4 %   Platelets 174 150 - 400 K/uL   nRBC 0.0 0.0 - 0.2 %    Comment: Performed at [facility], [address] [place], [place] [address]  Comprehensive metabolic panel with GFR     Status: Abnormal   Collection Time: 09/08/24  4:43 AM  Result Value Ref Range   Sodium 144 135 - 145 mmol/L   Potassium 3.8 3.5 - 5.1 mmol/L   Chloride 105 98 - 111 mmol/L   CO2 28 22 - 32 mmol/L   Glucose, Bld 78 70 - 99 mg/dL    Comment: Glucose reference range applies only to samples taken after fasting for at least 8 hours.   BUN 38 (H) 8 - 23 mg/dL   Creatinine, Ser 2.34 (H) 0.61 - 1.24 mg/dL   Calcium 8.8 (L) 8.9 - 10.3 mg/dL   Total Protein 6.6 6.5 - 8.1 g/dL   Albumin 3.5 3.5 - 5.0 g/dL   AST 18 15 - 41 U/L   ALT 10 0 - 44 U/L   Alkaline Phosphatase 86 38 - 126 U/L   Total Bilirubin 0.4 0.0 - 1.2 mg/dL   GFR, Estimated 6 (L) >60 mL/min    Comment: (NOTE) Calculated using the CKD-EPI Creatinine Equation (2021)    Anion gap 11 5 - 15    Comment: Performed at [facility], [address], [place], [place] [address]  Hepatitis B surface antigen     Status: None   Collection Time: 09/08/24  8:17 AM  Result Value Ref Range   Hepatitis B Surface Ag NON REACTIVE NON REACTIVE    Comment: Performed at [facility], [address], [place], [place] [address]  Lipid panel     Status: Abnormal   Collection Time: 09/09/24  4:57 AM  Result Value Ref Range   Cholesterol 168 0 - 200 mg/dL    Comment:        ATP III CLASSIFICATION:  <200     mg/dL   Desirable  799-760  mg/dL   Borderline High  >=759    mg/dL  High           Triglycerides 93 <150 mg/dL   HDL 46 >59 mg/dL   Total  CHOL/HDL Ratio 3.7 RATIO   VLDL 19 0 - 40 mg/dL   LDL Cholesterol 895 (H) 0 - 99 mg/dL    Comment:        Total Cholesterol/HDL:CHD Risk Coronary Heart Disease Risk Table                     Men   Women  1/2 Average Risk   3.4   3.3  Average Risk       5.0   4.4  2 X Average Risk   9.6   7.1  3 X Average Risk  23.4   11.0        Use the calculated Patient Ratio above and the CHD Risk Table to determine the patient's CHD Risk.        ATP III CLASSIFICATION (LDL):  <100     mg/dL   Optimal  899-870  mg/dL   Near or Above                    Optimal  130-159  mg/dL   Borderline  839-810  mg/dL   High  >809     mg/dL   Very High Performed at [facility], [address], [place], [place] [address]   Renal function panel     Status: Abnormal   Collection Time: 09/09/24  4:57 AM  Result Value Ref Range   Sodium 143 135 - 145 mmol/L   Potassium 4.0 3.5 - 5.1 mmol/L   Chloride 107 98 - 111 mmol/L   CO2 25 22 - 32 mmol/L   Glucose, Bld 80 70 - 99 mg/dL    Comment: Glucose reference range applies only to samples taken after fasting for at least 8 hours.   BUN 46 (H) 8 - 23 mg/dL   Creatinine, Ser 1.05 (H) 0.61 - 1.24 mg/dL   Calcium 8.7 (L) 8.9 - 10.3 mg/dL   Phosphorus 2.9 2.5 - 4.6 mg/dL   Albumin 3.6 3.5 - 5.0 g/dL   GFR, Estimated 5 (L) >60 mL/min    Comment: (NOTE) Calculated using the CKD-EPI Creatinine Equation (2021)    Anion gap 12 5 - 15    Comment: Performed at [facility], [address] [place], [place] [address]  CBC     Status: Abnormal   Collection Time: 09/09/24  4:57 AM  Result Value Ref Range   WBC 5.9 4.0 - 10.5 K/uL   RBC 4.21 (L) 4.22 - 5.81 MIL/uL   Hemoglobin 11.0 (L) 13.0 - 17.0 g/dL   HCT 66.6 (L) 60.9 - 47.9 %   MCV 79.1 (L) 80.0 - 100.0 fL   MCH 26.1 26.0 - 34.0 pg   MCHC 33.0 30.0 - 36.0 g/dL   RDW 82.3 (H) 88.4 - 84.4 %   Platelets 173 150 - 400 K/uL   nRBC 0.0 0.0 - 0.2 %    Comment: Performed at [facility], [address]  [place], [place] [address]  Magnesium     Status: None   Collection Time: 09/09/24  4:57 AM  Result Value Ref Range   Magnesium 1.7 1.7 - 2.4 mg/dL    Comment: Performed at [facility], [address], [place], [place] [address]    MR BRAIN WO CONTRAST Result Date: 09/07/2024 CLINICAL DATA:  Initial evaluation for acute TIA,  dizziness. EXAM: MRI HEAD WITHOUT CONTRAST TECHNIQUE: Multiplanar, multiecho pulse sequences of the brain and surrounding structures were obtained without intravenous contrast. COMPARISON:  CT from 04/18/2020. FINDINGS: Brain: Cerebral volume within normal limits. Patchy T2/FLAIR hyperintensity involving the periventricular and deep white matter both cerebral hemispheres, consistent with chronic small vessel ischemic disease, mild for age. No evidence for acute or subacute infarct. No areas of chronic cortical infarction. No acute or significant chronic intracranial blood products. No mass lesion, midline shift or mass effect. No hydrocephalus or extra-axial fluid collection. Pituitary gland within normal limits. Vascular: Major intracranial vascular flow voids are maintained. Skull and upper cervical spine: Craniocervical junction within normal limits. Decreased T1 signal intensity noted within the visualized bone marrow, nonspecific, but most commonly related to anemia, smoking or obesity. Prior ACDF at C3-4 noted. No scalp soft tissue abnormality. Sinuses/Orbits: Prior bilateral ocular lens replacement. Paranasal sinuses are largely clear. No significant mastoid effusion. Other: None. IMPRESSION: 1. No acute intracranial abnormality. 2. Mild chronic microvascular ischemic disease for age. Electronically Signed   By: Morene Hoard M.D.   On: 09/07/2024 17:49     Outpatient HD orders: [facility] TTS.  3.5 hours.  EDW 74  kg.  AVG LUE.  Flow rates: 400/500.  2K/2.5 calcium.  Heparin : 1000 units loading bolus, 700 units/hr.   Assessment/PlanJoe [name] is an 88 y.o. male with  ESRD on HD TTS, HTN, HL, h/o prostate ca, DM currently admitted with dizziness and nephrology is consulted for evaluation and management of ESRD and assoc conditions.   **Dizziness: likely vertigo as testing unrevealing.  Per primary.   **ESRD on HD: usually TTS, missed Thurs being admitted.  For HD today then will plan another short tx tomorrow to resume usual schedule if he remains admitted.  If he d/c today he can go to outpt unit tomorrow.   **Anemia: Hb 11, mild, CTM.   **Secondary hyperparathyroidism:    Not on binder, phos and corr ca normal.    **HTN: on home meds, BP on higher side, trend with UF on HD.   Will follow - plan short HD tomorrow if he remains admitted but ok for d/c from nephrology perspective.  Won't plan to see over weekend unless pressing need.  Feel free to reach out to nephrology team with any concerns   Manuelita DELENA Barters 09/09/2024, 11:15 AM

## 2024-09-09 NOTE — Progress Notes (Signed)
 At 1154: RN spoke to dialysis RN. Goal for patient to have dialysis around 1500 today.  Morning BP meds held prior to dialysis. BP 172/76 and HR 67 at 1024

## 2024-09-09 NOTE — Plan of Care (Signed)
  Problem: Acute Rehab PT Goals(only PT should resolve) Goal: Pt Will Go Supine/Side To Sit Outcome: Progressing Flowsheets (Taken 09/09/2024 1653) Pt will go Supine/Side to Sit:  with modified independence  with supervision Goal: Patient Will Transfer Sit To/From Stand Outcome: Progressing Flowsheets (Taken 09/09/2024 1653) Patient will transfer sit to/from stand:  with modified independence  with supervision Goal: Pt Will Transfer Bed To Chair/Chair To Bed Outcome: Progressing Flowsheets (Taken 09/09/2024 1653) Pt will Transfer Bed to Chair/Chair to Bed:  with modified independence  with supervision Goal: Pt Will Ambulate Outcome: Progressing Flowsheets (Taken 09/09/2024 1653) Pt will Ambulate:  75 feet  with modified independence  with supervision  with rolling walker   4:53 PM, 09/09/24 Lynwood Music, MPT Physical Therapist with [facility] 336 [phone] office [phone] mobile phone

## 2024-09-09 NOTE — Progress Notes (Signed)
 PROGRESS NOTE   Steve [name]  FMW:[id number] DOB: Jul 07, 1936 DOA: 09/07/2024 PCP: Carlette Benita Area, MD   Chief Complaint  Patient presents with   Dizziness   Level of care: Telemetry  Brief Admission History:  89 y.o. male with medical history significant of hypertension, hyperlipidemia, T2DM, ESRD (TTS), GERD, prostate cancer who presents to the emergency department due to 2-day onset of intermittent episodes of dizziness which presents as room spinning with position change, patient also complained of sensation of fullness or ringing in his right ear.  Last dialysis was  (11/25) and he had full dialysis, however, he needed help to be able to get in and out of the vehicle, this is a change from baseline however a uses a walker without any other assistive device.  He denies fever, chills, chest pain, shortness of breath, nausea or vomiting. He reports no prior history of vertigo or dizziness like this.     ED course In the emergency department, BP was 121/58, other vital signs were within normal range.  Workup in the ED showed microcytic anemia.  BMP was normal except for blood glucose of 136, BUN 31, creatinine 6.63, eGFR 8.  Troponin 80 > 84 > 83.  Urinalysis was normal.  Influenza A, B, SARS coronavirus 2, RSV was negative.  MRI brain without contrast showed no acute intracranial abnormality.    Assessment and Plan:  Dizziness Question of BPPV Continue meclizine  PRN Continue PT/OT eval and treat for vestibular evaluation Pt had another episode this morning  Added fall precautions Added neuro checks Added orthostatic vitals Q shift Added carotid doppler study and echo --follow up results of work up --if no significant findings he likely does have vertigo and may need vestibular rehab  Generalized weakness Continue management as described above  Sinus bradycardia  -- isolated HRs in the 40-50 range seen on tele -- we have reduced his metoprolol  succinate to 12.5 mg down  from 50 mg with hold parameters for HR<65 or SBP<110  HTN -- we have reduced his BP meds due to noticing softer BPs as well as bradycardia to be sure this isn't causing his symptoms---see orders   Elevated troponin possibly secondary to type II demand ischemia Troponin 80 > 84 > 83.  Patient denies chest pain Continue telemetry Cardiology was consulted but not available on this campus until 09/12/24.  He has no chest pain symptoms.  I feel ACS is ruled out.  Monitor for now.    Microcytic anemia/anemia of chronic disease MCV 79, iron  studies will be done   ESRD on HD (TTS) Last dialysis was yesterday (11/25) Nephrology consulted with plan for HD today and tomorrow to stay on schedule   Type 2 diabetes mellitus Hemoglobin A1c on 03/29/2024 was 5.1 Continue diet modification at this time Repeat A1c 5.8%  CBG (last 3)  Recent Labs    09/07/24 1215  GLUCAP 134*    DVT prophylaxis: sq heparin  Code Status: Full  Family Communication:  Disposition: home tomorrow if stable     Consultants:   Procedures:   Antimicrobials:    Subjective: He had another couple of episodes of dizziness since yesterday, felt like room was spinning, says it is less severe with the recent episodes, no syncope, no cP, no SOB. He didn't receive any meclizine  with the episodes   Objective: Vitals:   09/08/24 2023 09/09/24 0454 09/09/24 1024 09/09/24 1100  BP: (!) 169/66 (!) 169/70 (!) 172/76   Pulse: (!) 59 64 68  Resp: 20 19  18   Temp: 98.1 F (36.7 C) 98.5 F (36.9 C)    TempSrc: Oral Oral    SpO2: 97% 96%    Weight:        Intake/Output Summary (Last 24 hours) at 09/09/2024 1218 Last data filed at 09/09/2024 9062 Gross per 24 hour  Intake 240 ml  Output --  Net 240 ml   Filed Weights   09/07/24 1209  Weight: 76.2 kg   Examination:  General exam: Appears calm and comfortable  Respiratory system: Clear to auscultation. Respiratory effort normal. Cardiovascular system: normal S1 &  S2 heard. No JVD, murmurs, rubs, gallops or clicks. No pedal edema. Gastrointestinal system: Abdomen is nondistended, soft and nontender. No organomegaly or masses felt. Normal bowel sounds heard. Central nervous system: Alert and oriented. No focal neurological deficits. Extremities: Symmetric 5 x 5 power. Skin: No rashes, lesions or ulcers. Psychiatry: Judgement and insight appear normal. Mood & affect appropriate.   Data Reviewed: I have personally reviewed following labs and imaging studies  CBC: Recent Labs  Lab 09/07/24 1254 09/08/24 0443 09/09/24 0457  WBC 6.5 4.9 5.9  HGB 11.9* 10.9* 11.0*  HCT 35.7* 32.7* 33.3*  MCV 79.0* 78.4* 79.1*  PLT 205 174 173    Basic Metabolic Panel: Recent Labs  Lab 09/07/24 1254 09/07/24 2247 09/08/24 0443 09/09/24 0457  NA 140  --  144 143  K 3.7  --  3.8 4.0  CL 99  --  105 107  CO2 28  --  28 25  GLUCOSE 136*  --  78 80  BUN 31*  --  38* 46*  CREATININE 6.63*  --  7.65* 8.94*  CALCIUM 9.4  --  8.8* 8.7*  MG  --  1.7  --  1.7  PHOS  --  2.2*  --  2.9    CBG: Recent Labs  Lab 09/07/24 1215  GLUCAP 134*    Recent Results (from the past 240 hours)  Resp panel by RT-PCR (RSV, Flu A&B, Covid) Anterior Nasal Swab     Status: None   Collection Time: 09/07/24  2:44 PM   Specimen: Anterior Nasal Swab  Result Value Ref Range Status   SARS Coronavirus 2 by RT PCR NEGATIVE NEGATIVE Final    Comment: (NOTE) SARS-CoV-2 target nucleic acids are NOT DETECTED.  The SARS-CoV-2 RNA is generally detectable in upper respiratory specimens during the acute phase of infection. The lowest concentration of SARS-CoV-2 viral copies this assay can detect is 138 copies/mL. A negative result does not preclude SARS-Cov-2 infection and should not be used as the sole basis for treatment or other patient management decisions. A negative result may occur with  improper specimen collection/handling, submission of specimen other than nasopharyngeal  swab, presence of viral mutation(s) within the areas targeted by this assay, and inadequate number of viral copies(<138 copies/mL). A negative result must be combined with clinical observations, patient history, and epidemiological information. The expected result is Negative.  Fact Sheet for Patients:  bloggercourse.com  Fact Sheet for Healthcare Providers:  seriousbroker.it  This test is no t yet approved or cleared by the [place]  FDA and  has been authorized for detection and/or diagnosis of SARS-CoV-2 by FDA under an Emergency Use Authorization (EUA). This EUA will remain  in effect (meaning this test can be used) for the duration of the COVID-19 declaration under Section 564(b)(1) of the Act, 21 U.S.C.section 360bbb-3(b)(1), unless the authorization is terminated  or revoked sooner.  Influenza A by PCR NEGATIVE NEGATIVE Final   Influenza B by PCR NEGATIVE NEGATIVE Final    Comment: (NOTE) The Xpert Xpress SARS-CoV-2/FLU/RSV plus assay is intended as an aid in the diagnosis of influenza from Nasopharyngeal swab specimens and should not be used as a sole basis for treatment. Nasal washings and aspirates are unacceptable for Xpert Xpress SARS-CoV-2/FLU/RSV testing.  Fact Sheet for Patients: bloggercourse.com  Fact Sheet for Healthcare Providers: seriousbroker.it  This test is not yet approved or cleared by the [place]  FDA and has been authorized for detection and/or diagnosis of SARS-CoV-2 by FDA under an Emergency Use Authorization (EUA). This EUA will remain in effect (meaning this test can be used) for the duration of the COVID-19 declaration under Section 564(b)(1) of the Act, 21 U.S.C. section 360bbb-3(b)(1), unless the authorization is terminated or revoked.     Resp Syncytial Virus by PCR NEGATIVE NEGATIVE Final    Comment: (NOTE) Fact Sheet for  Patients: bloggercourse.com  Fact Sheet for Healthcare Providers: seriousbroker.it  This test is not yet approved or cleared by the [place]  FDA and has been authorized for detection and/or diagnosis of SARS-CoV-2 by FDA under an Emergency Use Authorization (EUA). This EUA will remain in effect (meaning this test can be used) for the duration of the COVID-19 declaration under Section 564(b)(1) of the Act, 21 U.S.C. section 360bbb-3(b)(1), unless the authorization is terminated or revoked.  Performed at [facility], [address] [place], [place] [address]      Radiology Studies: MR BRAIN WO CONTRAST Result Date: 09/07/2024 CLINICAL DATA:  Initial evaluation for acute TIA, dizziness. EXAM: MRI HEAD WITHOUT CONTRAST TECHNIQUE: Multiplanar, multiecho pulse sequences of the brain and surrounding structures were obtained without intravenous contrast. COMPARISON:  CT from 04/18/2020. FINDINGS: Brain: Cerebral volume within normal limits. Patchy T2/FLAIR hyperintensity involving the periventricular and deep white matter both cerebral hemispheres, consistent with chronic small vessel ischemic disease, mild for age. No evidence for acute or subacute infarct. No areas of chronic cortical infarction. No acute or significant chronic intracranial blood products. No mass lesion, midline shift or mass effect. No hydrocephalus or extra-axial fluid collection. Pituitary gland within normal limits. Vascular: Major intracranial vascular flow voids are maintained. Skull and upper cervical spine: Craniocervical junction within normal limits. Decreased T1 signal intensity noted within the visualized bone marrow, nonspecific, but most commonly related to anemia, smoking or obesity. Prior ACDF at C3-4 noted. No scalp soft tissue abnormality. Sinuses/Orbits: Prior bilateral ocular lens replacement. Paranasal sinuses are largely clear. No significant mastoid  effusion. Other: None. IMPRESSION: 1. No acute intracranial abnormality. 2. Mild chronic microvascular ischemic disease for age. Electronically Signed   By: Morene Hoard M.D.   On: 09/07/2024 17:49   Scheduled Meds:  aspirin   81 mg Oral Daily   Chlorhexidine  Gluconate Cloth  6 each Topical Q0600   gabapentin   100 mg Oral QHS   heparin   5,000 Units Subcutaneous Q8H   hydrALAZINE   50 mg Oral BID   linagliptin   5 mg Oral Daily   metoprolol  succinate  12.5 mg Oral Daily   NIFEdipine   30 mg Oral Daily   pantoprazole   40 mg Oral QAC breakfast   tamsulosin   0.4 mg Oral QPC supper   Continuous Infusions:   LOS: 0 days   Time spent: 55 mins  Trinidad Ingle Vicci, MD How to contact the [facility] Attending or Consulting provider 7A - 7P or covering provider during after hours 7P -7A, for this patient?  Check the  care team in [facility] and look for a) attending/consulting TR[facility] provider listed and b) the TRH team listed Log into [email or web address] to find provider on call.  Locate the TRH provider you are looking for under [facility] and page to a number that you can be directly reached. If you still have difficulty reaching the provider, please page the [facility] (Director on Call) for the Hospitalists listed on amion for assistance.  09/09/2024, 12:18 PM

## 2024-09-09 NOTE — Progress Notes (Signed)
*  PRELIMINARY RESULTS* Echocardiogram 2D Echocardiogram has been performed.  Steve [name] 09/09/2024, 11:23 AM

## 2024-09-09 NOTE — Evaluation (Signed)
 Occupational Therapy Evaluation Patient Details Name: Steve [name] MRN: [id number] DOB: 04/19/1936 Today's Date: 09/09/2024   History of Present Illness   Steve [name] is an 88 y.o. male with ESRD on HD TTS, HTN, HL, h/o prostate ca, DM currently admitted with dizziness.     Clinical Impressions PT admitted for concerns listed above. PTA pt reported that he was independent with all ADL's and IADL's, including community mobility. At this time due to dizziness he is requiring contact guard to min assist for safety. Prior to OT entry, PT had completed vestibular maneuvers on pt, where he reported a good improvement in his symptoms. Recommending SNF level therapies, unless pt is able to have increased assist at home while symptoms resolve. Acute OT will continues to follow.      If plan is discharge home, recommend the following:   A little help with walking and/or transfers;A little help with bathing/dressing/bathroom;Assistance with cooking/housework     Functional Status Assessment   Patient has had a recent decline in their functional status and demonstrates the ability to make significant improvements in function in a reasonable and predictable amount of time.     Equipment Recommendations   None recommended by OT     Recommendations for Other Services         Precautions/Restrictions   Precautions Precautions: Fall Recall of Precautions/Restrictions: Intact Restrictions Weight Bearing Restrictions Per Provider Order: No     Mobility Bed Mobility Overal bed mobility: Modified Independent             General bed mobility comments: No assist needed, however pt does get very dizzy upon sitting up.    Transfers Overall transfer level: Needs assistance Equipment used: Rolling walker (2 wheels) Transfers: Sit to/from Stand, Bed to chair/wheelchair/BSC Sit to Stand: Contact guard assist, Min assist     Step pivot transfers: Contact guard assist      General transfer comment: Initial stand pt required light min A, after wards able to stand with no assist only cga for safety      Balance Overall balance assessment: Needs assistance Sitting-balance support: No upper extremity supported, Feet supported Sitting balance-Leahy Scale: Good     Standing balance support: During functional activity, Reliant on assistive device for balance Standing balance-Leahy Scale: Fair Standing balance comment: Fair mainly due to dizzines                           ADL either performed or assessed with clinical judgement   ADL Overall ADL's : Needs assistance/impaired                                       General ADL Comments: Due to dizziness, pt is requqiring contact guard to min assist for safety at this time.     Vision Baseline Vision/History: 0 No visual deficits Ability to See in Adequate Light: 0 Adequate Patient Visual Report: No change from baseline Vision Assessment?: Vision impaired- to be further tested in functional context Additional Comments: Pt with extreme dizziness - PT addressed with vestibular maneuvers.     Perception         Praxis         Pertinent Vitals/Pain Pain Assessment Pain Assessment: No/denies pain     Extremity/Trunk Assessment Upper Extremity Assessment Upper Extremity Assessment: Overall WFL for tasks assessed   Lower Extremity Assessment  Lower Extremity Assessment: Defer to PT evaluation   Cervical / Trunk Assessment Cervical / Trunk Assessment: Normal   Communication Communication Communication: No apparent difficulties   Cognition Arousal: Alert Behavior During Therapy: WFL for tasks assessed/performed Cognition: No apparent impairments                               Following commands: Intact       Cueing  General Comments   Cueing Techniques: Verbal cues;Tactile cues  VSS on RA   Exercises     Shoulder Instructions      Home  Living Family/patient expects to be discharged to:: Private residence Living Arrangements: Alone Available Help at Discharge: Family;Friend(s);Available PRN/intermittently Type of Home: Apartment Home Access: Level entry     Home Layout: One level     Bathroom Shower/Tub: Chief Strategy Officer: Handicapped height Bathroom Accessibility: Yes How Accessible: Accessible via walker Home Equipment: Rolling Walker (2 wheels);Shower seat;Grab bars - tub/shower;Cane - single point          Prior Functioning/Environment Prior Level of Function : Independent/Modified Independent             Mobility Comments: primarily uses RW ADLs Comments: independent    OT Problem List: Decreased strength;Decreased activity tolerance;Impaired balance (sitting and/or standing);Impaired vision/perception;Impaired UE functional use   OT Treatment/Interventions: Self-care/ADL training;Therapeutic exercise;Neuromuscular education;DME and/or AE instruction;Energy conservation;Manual therapy;Therapeutic activities;Visual/perceptual remediation/compensation;Balance training      OT Goals(Current goals can be found in the care plan section)   Acute Rehab OT Goals Patient Stated Goal: To stop getting dizzy OT Goal Formulation: With patient Time For Goal Achievement: 09/23/24 Potential to Achieve Goals: Good ADL Goals Pt Will Perform Grooming: Independently;standing Pt Will Perform Lower Body Bathing: sitting/lateral leans;sit to/from stand;with modified independence Pt Will Perform Lower Body Dressing: with modified independence;sit to/from stand;sitting/lateral leans Pt Will Transfer to Toilet: with modified independence;ambulating Pt Will Perform Toileting - Clothing Manipulation and hygiene: with modified independence;sitting/lateral leans;sit to/from stand   OT Frequency:  Min 1X/week    Co-evaluation              AM-PAC OT 6 Clicks Daily Activity     Outcome Measure  Help from another person eating meals?: None Help from another person taking care of personal grooming?: A Little Help from another person toileting, which includes using toliet, bedpan, or urinal?: A Little Help from another person bathing (including washing, rinsing, drying)?: A Little Help from another person to put on and taking off regular upper body clothing?: A Little Help from another person to put on and taking off regular lower body clothing?: A Little 6 Click Score: 19   End of Session Equipment Utilized During Treatment: Gait belt;Rolling walker (2 wheels) Nurse Communication: Mobility status  Activity Tolerance: Patient tolerated treatment well Patient left: in chair;with call bell/phone within reach;with family/visitor present  OT Visit Diagnosis: Unsteadiness on feet (R26.81);Other abnormalities of gait and mobility (R26.89);Muscle weakness (generalized) (M62.81)                Time: 1005-1017 OT Time Calculation (min): 12 min Charges:  OT General Charges $OT Visit: 1 Visit OT Evaluation $OT Eval Low Complexity: 1 Low  Ailen Strauch Thelbert, OTR/L [facility]  Brandyce Dimario Elane Thelbert 09/09/2024, 12:11 PM

## 2024-09-09 NOTE — Plan of Care (Signed)
  Problem: Education: Goal: Knowledge of General Education information will improve Description: Including pain rating scale, medication(s)/side effects and non-pharmacologic comfort measures 09/09/2024 2310 by Olene Corean CROME, RN Outcome: Progressing 09/09/2024 2309 by Olene Corean CROME, RN Outcome: Progressing   Problem: Health Behavior/Discharge Planning: Goal: Ability to manage health-related needs will improve 09/09/2024 2310 by Olene Corean CROME, RN Outcome: Progressing 09/09/2024 2309 by Olene Corean CROME, RN Outcome: Progressing   Problem: Clinical Measurements: Goal: Ability to maintain clinical measurements within normal limits will improve 09/09/2024 2310 by Olene Corean CROME, RN Outcome: Progressing 09/09/2024 2309 by Olene Corean CROME, RN Outcome: Progressing Goal: Will remain free from infection 09/09/2024 2310 by Olene Corean CROME, RN Outcome: Progressing 09/09/2024 2309 by Olene Corean CROME, RN Outcome: Progressing Goal: Diagnostic test results will improve 09/09/2024 2310 by Olene Corean CROME, RN Outcome: Progressing 09/09/2024 2309 by Olene Corean CROME, RN Outcome: Progressing Goal: Respiratory complications will improve 09/09/2024 2310 by Olene Corean CROME, RN Outcome: Progressing 09/09/2024 2309 by Olene Corean CROME, RN Outcome: Progressing Goal: Cardiovascular complication will be avoided 09/09/2024 2310 by Olene Corean CROME, RN Outcome: Progressing 09/09/2024 2309 by Olene Corean CROME, RN Outcome: Progressing   Problem: Activity: Goal: Risk for activity intolerance will decrease 09/09/2024 2310 by Olene Corean CROME, RN Outcome: Progressing 09/09/2024 2309 by Olene Corean CROME, RN Outcome: Progressing   Problem: Nutrition: Goal: Adequate nutrition will be maintained 09/09/2024 2310 by Olene Corean CROME, RN Outcome: Progressing 09/09/2024 2309 by Olene Corean CROME, RN Outcome: Progressing   Problem: Coping: Goal: Level of anxiety will decrease 09/09/2024 2310 by Olene Corean CROME, RN Outcome: Progressing 09/09/2024 2309 by Olene Corean CROME, RN Outcome: Progressing   Problem: Elimination: Goal: Will not experience complications related to bowel motility 09/09/2024 2310 by Olene Corean CROME, RN Outcome: Progressing 09/09/2024 2309 by Olene Corean CROME, RN Outcome: Progressing Goal: Will not experience complications related to urinary retention 09/09/2024 2310 by Olene Corean CROME, RN Outcome: Progressing 09/09/2024 2309 by Olene Corean CROME, RN Outcome: Progressing   Problem: Pain Managment: Goal: General experience of comfort will improve and/or be controlled 09/09/2024 2310 by Olene Corean CROME, RN Outcome: Progressing 09/09/2024 2309 by Olene Corean CROME, RN Outcome: Progressing   Problem: Safety: Goal: Ability to remain free from injury will improve 09/09/2024 2310 by Olene Corean CROME, RN Outcome: Progressing 09/09/2024 2309 by Olene Corean CROME, RN Outcome: Progressing   Problem: Skin Integrity: Goal: Risk for impaired skin integrity will decrease 09/09/2024 2310 by Olene Corean CROME, RN Outcome: Progressing 09/09/2024 2309 by Olene Corean CROME, RN Outcome: Progressing

## 2024-09-09 NOTE — Plan of Care (Signed)

## 2024-09-09 NOTE — TOC Initial Note (Signed)
 Transition of Care [facility]) - Initial/Assessment Note    Patient Details  Name: Steve [name] MRN: [id number] Date of Birth: 08-Jul-1936  Transition of Care [facility]) CM/SW Contact:    Sharlyne Stabs, RN Phone Number: 09/09/2024, 1:58 PM  Clinical Narrative:    PT is recommending SNF. CM at the bedside. Patient lives home alone, with family support and DME that he needs in the home. We do not have Cardiology during the holidays. Patient wants to see Cardiology on Monday to see what is needed for his heart. He plans to return home. He is agreeable to home health.  IPCM following for complete workup to make a discharge plan.         Expected Discharge Plan: Home w Home Health Services Barriers to Discharge: Continued Medical Work up   Patient Goals and CMS Choice Patient states their goals for this hospitalization and ongoing recovery are:: return home CMS Medicare.gov Compare Post Acute Care list provided to:: Patient Choice offered to / list presented to : Patient  ownership interest in [facility].provided to:: Patient    Expected Discharge Plan and Services   Activities of Daily Living   ADL Screening (condition at time of admission) Independently performs ADLs?: Yes (appropriate for developmental age) Is the patient deaf or have difficulty hearing?: No Does the patient have difficulty seeing, even when wearing glasses/contacts?: No Does the patient have difficulty concentrating, remembering, or making decisions?: No  Permission Sought/Granted       Emotional Assessment   Attitude/Demeanor/Rapport: Engaged Affect (typically observed): Accepting Orientation: : Oriented to Self, Oriented to Place, Oriented to Situation Alcohol  / Substance Use: Not Applicable Psych Involvement: No (comment)  Admission diagnosis:  Dizziness [R42] Elevated troponin [R79.89] Patient Active Problem List   Diagnosis Date Noted   Dizziness 09/07/2024   End-stage renal  disease on hemodialysis (HCC) 03/30/2024   Hypertensive urgency 03/29/2024   Acute respiratory failure with hypoxia (HCC) 03/29/2024   Debility 01/23/2024   ESRD (end stage renal disease) (HCC) 01/23/2024   Dysphagia 09/22/2022   Hiatal hernia 09/22/2022   Status post cervical discectomy 08/23/2020   Cervical myelopathy (HCC) 08/23/2020   Atypical chest pain 04/18/2020   Acute kidney injury superimposed on CKD 04/18/2020   Gastroesophageal reflux disease    Fall at home, initial encounter 07/26/2018   Acute left-sided weakness 07/25/2018   Hyperlipidemia 07/25/2018   CKD (chronic kidney disease), stage IV (HCC) 05/13/2018   Chronic kidney disease, stage 4 (severe) (HCC) 05/13/2018   Chest pain 05/12/2018   Elevated troponin 07/02/2017   Type 2 diabetes mellitus with hyperlipidemia (HCC) 07/02/2017   IBS (irritable bowel syndrome) 01/28/2017   Prostate cancer (HCC) 07/10/2014   Abnormal ECG 04/12/2013   HTN (hypertension) 04/12/2013   SOB (shortness of breath) 04/12/2013   Type 2 diabetes mellitus with diabetic nephropathy (HCC) 01/11/2013   CHEST PAIN-UNSPECIFIED 06/12/2009   PCP:  Carlette Benita Area, MD Pharmacy:   [facility] - [place] - [address] [place] [address] Phone: [phone] Fax: [phone]     Social Drivers of Health (SDOH) Social History: SDOH Screenings   Food Insecurity: No Food Insecurity (09/07/2024)  Housing: Low Risk  (09/07/2024)  Transportation Needs: No Transportation Needs (09/07/2024)  Utilities: Not At Risk (09/07/2024)  Depression (PHQ2-9): Low Risk  (05/12/2019)  Social Connections: Socially Isolated (09/07/2024)  Tobacco Use: Medium Risk (03/29/2024)   SDOH Interventions:

## 2024-09-09 NOTE — Progress Notes (Signed)
 The patient unable to UF off 3 Liters as ordered.d/t  the patient  had bad cramping and  hypotension at last 20 mins. UF off 2.5 L.  09/09/24 1818  Vitals  Temp 97.6 F (36.4 C)  Temp Source Oral  BP Location Right Arm  BP Method Automatic  Patient Position (if appropriate) Lying  Pulse Rate 70  Resp 18  Oxygen  Therapy  SpO2 100 %  O2 Device Room Air  During Treatment Monitoring  Intra-Hemodialysis Comments See progress note  Post Treatment  Dialyzer Clearance Lightly streaked  Hemodialysis Intake (mL) 300 mL  Liters Processed 70  Fluid Removed (mL) 2500 mL  Tolerated HD Treatment Yes  Post-Hemodialysis Comments see notes.  AVG/AVF Arterial Site Held (minutes) 7 minutes  AVG/AVF Venous Site Held (minutes) 7 minutes

## 2024-09-09 NOTE — Evaluation (Signed)
 Physical Therapy Evaluation Patient Details Name: Steve [name] MRN: [id number] DOB: 1936/03/06 Today's Date: 09/09/2024  History of Present Illness  Steve [name] is a 88 y.o. male with medical history significant of hypertension, hyperlipidemia, T2DM, ESRD (TTS), GERD, prostate cancer who presents to the emergency department due to 2-day onset of intermittent episodes of dizziness which presents as room spinning with position change, patient also complained of sensation of fullness or ringing in his right ear.  Last dialysis was yesterday (11/25) and he had full dialysis, however, he needed help to be able to get in and out of the vehicle, this is a change from baseline however a uses a walker without any other assistive device.  He denies fever, chills, chest pain, shortness of breath, nausea or vomiting.   Clinical Impression  Patient seen for vestibular assessment and put into right and left  Dix-Hallpike maneuvers and noted to have severe up beating nystagmus on left side with c/o severe vertigo symptoms. Patient put in Epley maneuver remove calcium carbonate crystals (canaliths) for the left side, after procedure patient stated no dizziness or vertigo symptoms and able to ambulate in hallway without loss of balance or c/o of symptoms. Patient unsteady on feet and limited for gait training due to c/o fatigue and generalized weakness. Patient tolerated sitting up in chair after therapy. Patient will benefit from continued skilled physical therapy in hospital and recommended venue below to increase strength, balance, endurance for safe ADLs and gait.        If plan is discharge home, recommend the following: A little help with walking and/or transfers;A little help with bathing/dressing/bathroom;Help with stairs or ramp for entrance;Assist for transportation;Assistance with cooking/housework   Can travel by private vehicle   Yes    Equipment Recommendations None recommended by PT   Recommendations for Other Services       Functional Status Assessment Patient has had a recent decline in their functional status and demonstrates the ability to make significant improvements in function in a reasonable and predictable amount of time.     Precautions / Restrictions Precautions Precautions: Fall Recall of Precautions/Restrictions: Intact Restrictions Weight Bearing Restrictions Per Provider Order: No      Mobility  Bed Mobility Overal bed mobility: Modified Independent                  Transfers Overall transfer level: Needs assistance Equipment used: Rolling walker (2 wheels) Transfers: Sit to/from Stand, Bed to chair/wheelchair/BSC Sit to Stand: Contact guard assist, Min assist   Step pivot transfers: Contact guard assist       General transfer comment: unsteady labored movement with diffiuclty transferring to/from commode in bathroom    Ambulation/Gait Ambulation/Gait assistance: Min assist Gait Distance (Feet): 35 Feet Assistive device: Rollator (4 wheels) Gait Pattern/deviations: Step-through pattern, Decreased step length - right, Decreased stride length, Trunk flexed Gait velocity: decreased     General Gait Details: slow labored movement without loss of  balance, no c/o dizziness  Stairs            Wheelchair Mobility     Tilt Bed    Modified Rankin (Stroke Patients Only)       Balance Overall balance assessment: Needs assistance Sitting-balance support: Feet supported, No upper extremity supported Sitting balance-Leahy Scale: Good Sitting balance - Comments: seated at EOB   Standing balance support: During functional activity, Bilateral upper extremity supported Standing balance-Leahy Scale: Fair Standing balance comment: using RW  Pertinent Vitals/Pain Pain Assessment Pain Assessment: No/denies pain    Home Living Family/patient expects to be discharged to:: Private  residence Living Arrangements: Alone Available Help at Discharge: Family;Friend(s);Available PRN/intermittently Type of Home: Apartment Home Access: Level entry       Home Layout: One level Home Equipment: Agricultural Consultant (2 wheels);Shower seat;Grab bars - tub/shower;Cane - single point      Prior Function Prior Level of Function : Independent/Modified Independent             Mobility Comments: household and short distanced community ambulation using RW, does not drive ADLs Comments: independent     Extremity/Trunk Assessment   Upper Extremity Assessment Upper Extremity Assessment: Defer to OT evaluation    Lower Extremity Assessment Lower Extremity Assessment: Generalized weakness    Cervical / Trunk Assessment Cervical / Trunk Assessment: Normal  Communication   Communication Communication: No apparent difficulties    Cognition Arousal: Alert Behavior During Therapy: WFL for tasks assessed/performed   PT - Cognitive impairments: No apparent impairments                         Following commands: Intact       Cueing Cueing Techniques: Verbal cues, Tactile cues     General Comments      Exercises     Assessment/Plan    PT Assessment Patient needs continued PT services  PT Problem List Decreased strength;Decreased activity tolerance;Decreased balance;Decreased mobility       PT Treatment Interventions DME instruction;Gait training;Stair training;Functional mobility training;Therapeutic activities;Therapeutic exercise;Balance training;Patient/family education    PT Goals (Current goals can be found in the Care Plan section)  Acute Rehab PT Goals Patient Stated Goal: return home with family to assist PT Goal Formulation: With patient/family Time For Goal Achievement: 09/23/24 Potential to Achieve Goals: Good    Frequency Min 3X/week     Co-evaluation PT/OT/SLP Co-Evaluation/Treatment: Yes Reason for Co-Treatment: To address  functional/ADL transfers PT goals addressed during session: Mobility/safety with mobility;Balance;Proper use of DME         AM-PAC PT 6 Clicks Mobility  Outcome Measure Help needed turning from your back to your side while in a flat bed without using bedrails?: None Help needed moving from lying on your back to sitting on the side of a flat bed without using bedrails?: A Little Help needed moving to and from a bed to a chair (including a wheelchair)?: A Little Help needed standing up from a chair using your arms (e.g., wheelchair or bedside chair)?: A Little Help needed to walk in hospital room?: A Little Help needed climbing 3-5 steps with a railing? : A Lot 6 Click Score: 18    End of Session   Activity Tolerance: Patient tolerated treatment well;Patient limited by fatigue;Other (comment) (limited by dizziness) Patient left: in chair;with family/visitor present Nurse Communication: Mobility status PT Visit Diagnosis: Unsteadiness on feet (R26.81);Other abnormalities of gait and mobility (R26.89);Muscle weakness (generalized) (M62.81);Dizziness and giddiness (R42)    Time: 9058-8983 PT Time Calculation (min) (ACUTE ONLY): 35 min   Charges:   PT Evaluation $PT Eval Moderate Complexity: 1 Mod PT Treatments $Therapeutic Activity: 8-22 mins $Canalith Rep Proc: 8-22 mins PT General Charges $$ ACUTE PT VISIT: 1 Visit         4:52 PM, 09/09/24 Lynwood Music, MPT Physical Therapist with [facility] [phone] office [phone] mobile phone

## 2024-09-10 DIAGNOSIS — R42 Dizziness and giddiness: Secondary | ICD-10-CM | POA: Diagnosis not present

## 2024-09-10 DIAGNOSIS — Z992 Dependence on renal dialysis: Secondary | ICD-10-CM | POA: Diagnosis not present

## 2024-09-10 DIAGNOSIS — N186 End stage renal disease: Secondary | ICD-10-CM | POA: Diagnosis not present

## 2024-09-10 LAB — CBC
HCT: 32.3 % — ABNORMAL LOW (ref 39.0–52.0)
Hemoglobin: 11.2 g/dL — ABNORMAL LOW (ref 13.0–17.0)
MCH: 26.9 pg (ref 26.0–34.0)
MCHC: 34.7 g/dL (ref 30.0–36.0)
MCV: 77.5 fL — ABNORMAL LOW (ref 80.0–100.0)
Platelets: 168 K/uL (ref 150–400)
RBC: 4.17 MIL/uL — ABNORMAL LOW (ref 4.22–5.81)
RDW: 17.4 % — ABNORMAL HIGH (ref 11.5–15.5)
WBC: 5.8 K/uL (ref 4.0–10.5)
nRBC: 0 % (ref 0.0–0.2)

## 2024-09-10 LAB — RENAL FUNCTION PANEL
Albumin: 3.8 g/dL (ref 3.5–5.0)
Anion gap: 11 (ref 5–15)
BUN: 33 mg/dL — ABNORMAL HIGH (ref 8–23)
CO2: 28 mmol/L (ref 22–32)
Calcium: 8.7 mg/dL — ABNORMAL LOW (ref 8.9–10.3)
Chloride: 100 mmol/L (ref 98–111)
Creatinine, Ser: 6.66 mg/dL — ABNORMAL HIGH (ref 0.61–1.24)
GFR, Estimated: 8 mL/min — ABNORMAL LOW (ref >60)
Glucose, Bld: 95 mg/dL (ref 70–99)
Phosphorus: 2.6 mg/dL (ref 2.5–4.6)
Potassium: 3.9 mmol/L (ref 3.5–5.1)
Sodium: 139 mmol/L (ref 135–145)

## 2024-09-10 LAB — ECHOCARDIOGRAM COMPLETE
Area-P 1/2: 3.6 cm2
MV M vel: 5.2 m/s
MV Peak grad: 108.2 mmHg
S' Lateral: 2.2 cm
Weight: 2688 [oz_av]

## 2024-09-10 LAB — HEPATITIS B SURFACE ANTIBODY, QUANTITATIVE: Hep B S AB Quant (Post): 57.2 m[IU]/mL

## 2024-09-10 NOTE — Procedures (Signed)
 I was present at this dialysis session. I have reviewed the session itself and made appropriate changes.   Vital signs in last 24 hours:  Temp:  [97.6 F (36.4 C)-98.7 F (37.1 C)] 98.6 F (37 C) (11/29 0951) Pulse Rate:  [56-81] 63 (11/29 1030) Resp:  [14-31] 18 (11/29 1100) BP: (86-186)/(51-74) 159/52 (11/29 1030) SpO2:  [95 %-100 %] 100 % (11/29 1030) Weight:  [73 kg-75.3 kg] 73 kg (11/28 1824) Weight change:  Filed Weights   09/07/24 1209 09/09/24 1447 09/09/24 1824  Weight: 76.2 kg 75.3 kg 73 kg    Recent Labs  Lab 09/10/24 0402  NA 139  K 3.9  CL 100  CO2 28  GLUCOSE 95  BUN 33*  CREATININE 6.66*  CALCIUM 8.7*  PHOS 2.6    Recent Labs  Lab 09/08/24 0443 09/09/24 0457 09/10/24 0402  WBC 4.9 5.9 5.8  HGB 10.9* 11.0* 11.2*  HCT 32.7* 33.3* 32.3*  MCV 78.4* 79.1* 77.5*  PLT 174 173 168    Scheduled Meds:  aspirin   81 mg Oral Daily   Chlorhexidine  Gluconate Cloth  6 each Topical Q0600   gabapentin   100 mg Oral QHS   heparin   5,000 Units Subcutaneous Q8H   hydrALAZINE   50 mg Oral BID   linagliptin   5 mg Oral Daily   metoprolol  succinate  12.5 mg Oral Daily   NIFEdipine   30 mg Oral Daily   pantoprazole   40 mg Oral QAC breakfast   tamsulosin   0.4 mg Oral QPC supper   Continuous Infusions: PRN Meds:.acetaminophen  **OR** acetaminophen , artificial tears, meclizine , ondansetron  **OR** ondansetron  (ZOFRAN ) IV   Fairy DELENA Sellar,  MD 09/10/2024, 11:37 AM

## 2024-09-10 NOTE — Plan of Care (Signed)

## 2024-09-10 NOTE — TOC Progression Note (Signed)
 Transition of Care [facility]) - Progression Note    Patient Details  Name: Steve [name] MRN: [id number] Date of Birth: [birth year]  Transition of Care [facility]) CM/SW Contact  Nancee Powell BIRCH, LCSW Phone Number: 09/10/2024, 10:44 AM  Clinical Narrative:    Patient continues to decline SNF. States that he has business to take care of on Monday. Patient has to pay his bills and there is no one who can assist him with these tasks. Patient states that his he and his friend, Madeleine Rowels, typically go out every Monday and this Monday they will go pay bills. Patient's son lives in [place].  Patient is agreeable to home health services. Cory with [facility] accepting of [facility] referral. Patient does HD TTS at [facility] in [place]. Patient expected to d/c home with [facility] services today.    Expected Discharge Plan: Home w Home Health Services Barriers to Discharge: Continued Medical Work up               Expected Discharge Plan and Services                                   HH Arranged: PT [facility] Agency: [facility] Health Care Date [facility] Agency Contacted: 09/10/24 Time HH Agency Contacted: 1038 Representative spoke with at [facility] Agency: Darleene   Social Drivers of Health (SDOH) Interventions SDOH Screenings   Food Insecurity: No Food Insecurity (09/07/2024)  Housing: Low Risk  (09/07/2024)  Transportation Needs: No Transportation Needs (09/07/2024)  Utilities: Not At Risk (09/07/2024)  Depression (PHQ2-9): Low Risk  (05/12/2019)  Social Connections: Socially Isolated (09/07/2024)  Tobacco Use: Medium Risk (03/29/2024)    Readmission Risk Interventions     No data to display

## 2024-09-10 NOTE — Progress Notes (Signed)
 The patient has completed the dialysis treatment without issue and goal met.  09/10/24 1241  Vitals  Temp 98.4 F (36.9 C)  Temp Source Oral  BP (!) 147/61  BP Location Left Arm  BP Method Automatic  Patient Position (if appropriate) Lying  Resp 18  Weight 71.5 kg  Type of Weight Post-Dialysis  Oxygen  Therapy  O2 Device Room Air  Post Treatment  Dialyzer Clearance Lightly streaked  Hemodialysis Intake (mL) 0 mL  Liters Processed 50  Fluid Removed (mL) 1000 mL  Tolerated HD Treatment Yes  Post-Hemodialysis Comments see notes  AVG/AVF Arterial Site Held (minutes) 7 minutes  AVG/AVF Venous Site Held (minutes) 7 minutes

## 2024-09-10 NOTE — Progress Notes (Addendum)
 1005: Off floor, to dialysis   1521 Made Dr Vicci aware patient reports 9/10 pain to right wrist below the thumb. RN placed a heat pack on it and gave tylenol  prn for pain.

## 2024-09-10 NOTE — Progress Notes (Signed)
 PROGRESS NOTE   Steve [name]  FMW:[id number] DOB: [birth year] DOA: 09/07/2024 PCP: Carlette Benita Area, MD   Chief Complaint  Patient presents with   Dizziness   Level of care: Telemetry  Brief Admission History:  88 y.o. male with medical history significant of hypertension, hyperlipidemia, T2DM, ESRD (TTS), GERD, prostate cancer who presents to the emergency department due to 2-day onset of intermittent episodes of dizziness which presents as room spinning with position change, patient also complained of sensation of fullness or ringing in his right ear.  Last dialysis was  (11/25) and he had full dialysis, however, he needed help to be able to get in and out of the vehicle, this is a change from baseline however a uses a walker without any other assistive device.  He denies fever, chills, chest pain, shortness of breath, nausea or vomiting. He reports no prior history of vertigo or dizziness like this.     ED course In the emergency department, BP was 121/58, other vital signs were within normal range.  Workup in the ED showed microcytic anemia.  BMP was normal except for blood glucose of 136, BUN 31, creatinine 6.63, eGFR 8.  Troponin 80 > 84 > 83.  Urinalysis was normal.  Influenza A, B, SARS coronavirus 2, RSV was negative.  MRI brain without contrast showed no acute intracranial abnormality.    Assessment and Plan:  Dizziness Suspect this is BPPV Continue meclizine  PRN Continue PT eval and treat for vestibular evaluation Pt having less episodes since working with PT on vestibular rehab Added fall precautions Added neuro checks Added orthostatic vitals Q shift carotid doppler study:  Moderate stenosis of left ICA, mild stenosis right ICA, patent vertebrals TTE 11/28: LVEF 70-75% no RWMAs, grade 1 DD, normal RV function --he likely does have vertigo and may need ongoing vestibular rehab --was going to discharge 11/29 however patient would like to see cardiology, who won't be  available on this campus until 12/1.   Generalized weakness Continue management as described above  Sinus bradycardia  -- isolated HRs in the 40-50 range seen on tele -- we have reduced his metoprolol  succinate to 12.5 mg down from 50 mg with hold parameters for HR<65 or SBP<110  HTN -- we have reduced his BP meds due to noticing softer BPs as well as bradycardia to be sure this isn't causing his symptoms---see orders   Elevated troponin possibly secondary to type II demand ischemia Troponin 80 > 84 > 83.  Patient denies chest pain Continue telemetry Cardiology was consulted but not available on this campus until 09/12/24.  He has no chest pain symptoms.  I feel ACS is ruled out.  Monitor for now.    Microcytic anemia/anemia of chronic disease MCV 79, iron  studies will be done   ESRD on HD (TTS) Nephrology consulted with plan for HD 11/28 and 11/29 to stay on schedule   Type 2 diabetes mellitus Hemoglobin A1c on 03/29/2024 was 5.1 Continue diet modification at this time Repeat A1c 5.8%    DVT prophylaxis: sq heparin  Code Status: Full  Family Communication:  Disposition: home with [facility] on 12/1 if ok with cardiology     Consultants:   Procedures:   Antimicrobials:    Subjective: Pt says that the vestibular maneuvers he learned from PT has been helping. He says he still wants to wait to see cardiology on Monday.  He says he does not want SNF and will accept home health services.    Objective: Vitals:  09/10/24 1130 09/10/24 1200 09/10/24 1230 09/10/24 1241  BP: (!) 153/65 126/60 (!) 150/65 (!) 147/61  Pulse: 67 67 69   Resp: 18 18 18 18   Temp:    98.4 F (36.9 C)  TempSrc:    Oral  SpO2: 100% 100% 100%   Weight:    71.5 kg    Intake/Output Summary (Last 24 hours) at 09/10/2024 1410 Last data filed at 09/10/2024 1241 Gross per 24 hour  Intake 60 ml  Output 3650 ml  Net -3590 ml   Filed Weights   09/09/24 1447 09/09/24 1824 09/10/24 1241  Weight: 75.3 kg 73  kg 71.5 kg   Examination:  General exam: Appears calm and comfortable  Respiratory system: Clear to auscultation. Respiratory effort normal. Cardiovascular system: normal S1 & S2 heard. No JVD, murmurs, rubs, gallops or clicks. No pedal edema. Gastrointestinal system: Abdomen is nondistended, soft and nontender. No organomegaly or masses felt. Normal bowel sounds heard. Central nervous system: Alert and oriented. No focal neurological deficits. Extremities: Symmetric 5 x 5 power. Skin: No rashes, lesions or ulcers. Psychiatry: Judgement and insight appear normal. Mood & affect appropriate.   Data Reviewed: I have personally reviewed following labs and imaging studies  CBC: Recent Labs  Lab 09/07/24 1254 09/08/24 0443 09/09/24 0457 09/10/24 0402  WBC 6.5 4.9 5.9 5.8  HGB 11.9* 10.9* 11.0* 11.2*  HCT 35.7* 32.7* 33.3* 32.3*  MCV 79.0* 78.4* 79.1* 77.5*  PLT 205 174 173 168    Basic Metabolic Panel: Recent Labs  Lab 09/07/24 1254 09/07/24 2247 09/08/24 0443 09/09/24 0457 09/10/24 0402  NA 140  --  144 143 139  K 3.7  --  3.8 4.0 3.9  CL 99  --  105 107 100  CO2 28  --  28 25 28   GLUCOSE 136*  --  78 80 95  BUN 31*  --  38* 46* 33*  CREATININE 6.63*  --  7.65* 8.94* 6.66*  CALCIUM 9.4  --  8.8* 8.7* 8.7*  MG  --  1.7  --  1.7  --   PHOS  --  2.2*  --  2.9 2.6    CBG: Recent Labs  Lab 09/07/24 1215  GLUCAP 134*    Recent Results (from the past 240 hours)  Resp panel by RT-PCR (RSV, Flu A&B, Covid) Anterior Nasal Swab     Status: None   Collection Time: 09/07/24  2:44 PM   Specimen: Anterior Nasal Swab  Result Value Ref Range Status   SARS Coronavirus 2 by RT PCR NEGATIVE NEGATIVE Final    Comment: (NOTE) SARS-CoV-2 target nucleic acids are NOT DETECTED.  The SARS-CoV-2 RNA is generally detectable in upper respiratory specimens during the acute phase of infection. The lowest concentration of SARS-CoV-2 viral copies this assay can detect is 138 copies/mL.  A negative result does not preclude SARS-Cov-2 infection and should not be used as the sole basis for treatment or other patient management decisions. A negative result may occur with  improper specimen collection/handling, submission of specimen other than nasopharyngeal swab, presence of viral mutation(s) within the areas targeted by this assay, and inadequate number of viral copies(<138 copies/mL). A negative result must be combined with clinical observations, patient history, and epidemiological information. The expected result is Negative.  Fact Sheet for Patients:  bloggercourse.com  Fact Sheet for Healthcare Providers:  seriousbroker.it  This test is no t yet approved or cleared by the [place]  FDA and  has been authorized for detection  and/or diagnosis of SARS-CoV-2 by FDA under an Emergency Use Authorization (EUA). This EUA will remain  in effect (meaning this test can be used) for the duration of the COVID-19 declaration under Section 564(b)(1) of the Act, 21 U.S.C.section 360bbb-3(b)(1), unless the authorization is terminated  or revoked sooner.       Influenza A by PCR NEGATIVE NEGATIVE Final   Influenza B by PCR NEGATIVE NEGATIVE Final    Comment: (NOTE) The Xpert Xpress SARS-CoV-2/FLU/RSV plus assay is intended as an aid in the diagnosis of influenza from Nasopharyngeal swab specimens and should not be used as a sole basis for treatment. Nasal washings and aspirates are unacceptable for Xpert Xpress SARS-CoV-2/FLU/RSV testing.  Fact Sheet for Patients: bloggercourse.com  Fact Sheet for Healthcare Providers: seriousbroker.it  This test is not yet approved or cleared by the [place]  FDA and has been authorized for detection and/or diagnosis of SARS-CoV-2 by FDA under an Emergency Use Authorization (EUA). This EUA will remain in effect (meaning this test  can be used) for the duration of the COVID-19 declaration under Section 564(b)(1) of the Act, 21 U.S.C. section 360bbb-3(b)(1), unless the authorization is terminated or revoked.     Resp Syncytial Virus by PCR NEGATIVE NEGATIVE Final    Comment: (NOTE) Fact Sheet for Patients: bloggercourse.com  Fact Sheet for Healthcare Providers: seriousbroker.it  This test is not yet approved or cleared by the [place]  FDA and has been authorized for detection and/or diagnosis of SARS-CoV-2 by FDA under an Emergency Use Authorization (EUA). This EUA will remain in effect (meaning this test can be used) for the duration of the COVID-19 declaration under Section 564(b)(1) of the Act, 21 U.S.C. section 360bbb-3(b)(1), unless the authorization is terminated or revoked.  Performed at [facility], [address] [place], [place] [address]      Radiology Studies: ECHOCARDIOGRAM COMPLETE Result Date: 09/10/2024    ECHOCARDIOGRAM REPORT   Patient Name:   ANDIE MORTIMER Date of Exam: 09/09/2024 Medical Rec #:  [id number]      Height:       65.0 in Accession #:    [id number]     Weight:       168.0 lb Date of Birth:  10-Dec-1935      BSA:          1.837 m Patient Age:    88 years       BP:           169/70 mmHg Patient Gender: M              HR:           64 bpm. Exam Location:  [facility] Procedure: 2D Echo, Cardiac Doppler and Color Doppler (Both Spectral and Color            Flow Doppler were utilized during procedure). Indications:    Abnormal ECG R94.31  History:        Patient has prior history of Echocardiogram examinations, most                 recent 04/18/2020. Risk Factors:Hypertension, Diabetes and                 Dyslipidemia. Hx of ESRD on dailysis.  Sonographer:    Aida Pizza RCS Referring Phys: [id number] Vidalia Serpas L Irvine Glorioso IMPRESSIONS  1. Left ventricular ejection fraction, by estimation, is 70 to 75%. The left ventricle has hyperdynamic function.  The left ventricle has no regional wall motion abnormalities. There  is moderate left ventricular hypertrophy. Left ventricular diastolic parameters are consistent with Grade I diastolic dysfunction (impaired relaxation).  2. Right ventricular systolic function is normal. The right ventricular size is normal. There is moderately elevated pulmonary artery systolic pressure. The estimated right ventricular systolic pressure is 50.8 mmHg.  3. Right atrial size was moderately dilated.  4. The mitral valve is degenerative. Trivial mitral valve regurgitation.  5. The aortic valve is tricuspid. Aortic valve regurgitation is not visualized. Aortic valve sclerosis/calcification is present, without any evidence of aortic stenosis.  6. The inferior vena cava is normal in size with <50% respiratory variability, suggesting right atrial pressure of 8 mmHg. Comparison(s): Changes from prior study are noted. 04/18/2020: LVEF 65-70%, grade 2 DD, RVSP 46.8 mmHg. FINDINGS  Left Ventricle: Left ventricular ejection fraction, by estimation, is 70 to 75%. The left ventricle has hyperdynamic function. The left ventricle has no regional wall motion abnormalities. The left ventricular internal cavity size was normal in size. There is moderate left ventricular hypertrophy. Left ventricular diastolic parameters are consistent with Grade I diastolic dysfunction (impaired relaxation). Indeterminate filling pressures. Right Ventricle: The right ventricular size is normal. No increase in right ventricular wall thickness. Right ventricular systolic function is normal. There is moderately elevated pulmonary artery systolic pressure. The tricuspid regurgitant velocity is 3.27 m/s, and with an assumed right atrial pressure of 8 mmHg, the estimated right ventricular systolic pressure is 50.8 mmHg. Left Atrium: Left atrial size was normal in size. Right Atrium: Right atrial size was moderately dilated. Pericardium: There is no evidence of pericardial  effusion. Mitral Valve: The mitral valve is degenerative in appearance. Mild to moderate mitral annular calcification. Trivial mitral valve regurgitation. Tricuspid Valve: The tricuspid valve is grossly normal. Tricuspid valve regurgitation is mild. Aortic Valve: The aortic valve is tricuspid. Aortic valve regurgitation is not visualized. Aortic valve sclerosis/calcification is present, without any evidence of aortic stenosis. Pulmonic Valve: The pulmonic valve was grossly normal. Pulmonic valve regurgitation is trivial. Aorta: The aortic root and ascending aorta are structurally normal, with no evidence of dilitation. Venous: The inferior vena cava is normal in size with less than 50% respiratory variability, suggesting right atrial pressure of 8 mmHg. IAS/Shunts: No atrial level shunt detected by color flow Doppler.  LEFT VENTRICLE PLAX 2D LVIDd:         4.20 cm   Diastology LVIDs:         2.20 cm   LV e' medial:    8.95 cm/s LV PW:         1.30 cm   LV E/e' medial:  11.7 LV IVS:        1.40 cm   LV e' lateral:   8.58 cm/s LVOT diam:     1.90 cm   LV E/e' lateral: 12.2 LV SV:         74 LV SV Index:   40 LVOT Area:     2.84 cm  RIGHT VENTRICLE RV S prime:     15.00 cm/s TAPSE (M-mode): 2.7 cm LEFT ATRIUM             Index        RIGHT ATRIUM           Index LA diam:        3.50 cm 1.91 cm/m   RA Area:     20.40 cm LA Vol (A2C):   62.8 ml 34.19 ml/m  RA Volume:   60.80 ml  33.10 ml/m LA Vol (  A4C):   54.1 ml 29.45 ml/m LA Biplane Vol: 60.9 ml 33.15 ml/m  AORTIC VALVE LVOT Vmax:   119.00 cm/s LVOT Vmean:  74.000 cm/s LVOT VTI:    0.262 m  AORTA Ao Root diam: 3.60 cm MITRAL VALVE                TRICUSPID VALVE MV Area (PHT): 3.60 cm     TR Peak grad:   42.8 mmHg MV Decel Time: 211 msec     TR Vmax:        327.00 cm/s MR Peak grad: 108.2 mmHg MR Mean grad: 78.0 mmHg     SHUNTS MR Vmax:      520.00 cm/s   Systemic VTI:  0.26 m MR Vmean:     423.0 cm/s    Systemic Diam: 1.90 cm MV E velocity: 105.00 cm/s MV A  velocity: 99.90 cm/s MV E/A ratio:  1.05 Vinie Maxcy MD Electronically signed by Vinie Maxcy MD Signature Date/Time: 09/10/2024/12:07:15 PM    Final    US  Carotid Bilateral Result Date: 09/09/2024 CLINICAL DATA:  Dizziness EXAM: BILATERAL CAROTID DUPLEX ULTRASOUND TECHNIQUE: Elnor scale imaging, color Doppler and duplex ultrasound were performed of bilateral carotid and vertebral arteries in the neck. COMPARISON:  None Available. FINDINGS: Criteria: Quantification of carotid stenosis is based on velocity parameters that correlate the residual internal carotid diameter with NASCET-based stenosis levels, using the diameter of the distal internal carotid lumen as the denominator for stenosis measurement. The following velocity measurements were obtained: RIGHT ICA: 199/18 cm/sec CCA: 101/7 cm/sec SYSTOLIC ICA/CCA RATIO:  2.0 ECA:  269 cm/sec LEFT ICA: 93/19 cm/sec CCA: 107/6 cm/sec SYSTOLIC ICA/CCA RATIO:  0.9 ECA:  127 cm/sec RIGHT CAROTID ARTERY: Heterogeneous atherosclerotic plaque in the proximal internal carotid artery. By peak systolic velocity criteria, the estimated stenosis falls in the 50-69% diameter range. There is even greater elevation of the peak systolic velocity at the origin of the external carotid artery consistent with a greater than 60% diameter stenosis. RIGHT VERTEBRAL ARTERY:  Patent with normal antegrade flow. LEFT CAROTID ARTERY: Heterogeneous atherosclerotic plaque in the proximal internal carotid artery. By peak systolic velocity criteria, the estimated stenosis is less than 50%. LEFT VERTEBRAL ARTERY:  Patent with normal antegrade flow. IMPRESSION: 1. Moderate (50-69%) stenosis proximal right internal carotid artery secondary to heterogenous atherosclerotic plaque. 2. Mild (1-49%) stenosis proximal left internal carotid artery secondary to heterogenous atherosclerotic plaque. 3. Vertebral arteries are patent with normal antegrade flow. Electronically Signed   By: Wilkie Lent  M.D.   On: 09/09/2024 15:53   Scheduled Meds:  aspirin   81 mg Oral Daily   Chlorhexidine  Gluconate Cloth  6 each Topical Q0600   gabapentin   100 mg Oral QHS   heparin   5,000 Units Subcutaneous Q8H   hydrALAZINE   50 mg Oral BID   linagliptin   5 mg Oral Daily   metoprolol  succinate  12.5 mg Oral Daily   NIFEdipine   30 mg Oral Daily   pantoprazole   40 mg Oral QAC breakfast   tamsulosin   0.4 mg Oral QPC supper   Continuous Infusions:   LOS: 0 days   Time spent: 55 mins  Humza Tallerico Vicci, MD How to contact the [facility] Attending or Consulting provider 7A - 7P or covering provider during after hours 7P -7A, for this patient?  Check the care team in [facility] and look for a) attending/consulting [facility] provider listed and b) the [facility] team listed Log into [email or web address] to find provider on call.  Locate the [facility] provider you are looking for under [facility] and page to a number that you can be directly reached. If you still have difficulty reaching the provider, please page the [facility] (Director on Call) for the Hospitalists listed on amion for assistance.  09/10/2024, 2:10 PM

## 2024-09-11 ENCOUNTER — Observation Stay (HOSPITAL_COMMUNITY)

## 2024-09-11 DIAGNOSIS — R42 Dizziness and giddiness: Secondary | ICD-10-CM | POA: Diagnosis not present

## 2024-09-11 DIAGNOSIS — N186 End stage renal disease: Secondary | ICD-10-CM | POA: Diagnosis not present

## 2024-09-11 DIAGNOSIS — Z992 Dependence on renal dialysis: Secondary | ICD-10-CM | POA: Diagnosis not present

## 2024-09-11 NOTE — Plan of Care (Signed)
   Problem: Education: Goal: Knowledge of General Education information will improve Description Including pain rating scale, medication(s)/side effects and non-pharmacologic comfort measures Outcome: Progressing   Problem: Health Behavior/Discharge Planning: Goal: Ability to manage health-related needs will improve Outcome: Progressing

## 2024-09-11 NOTE — Progress Notes (Signed)
 PROGRESS NOTE   Steve [name]  FMW:[id number] DOB: [birth year] DOA: 09/07/2024 PCP: Carlette Benita Area, MD   Chief Complaint  Patient presents with   Dizziness   Level of care: Telemetry  Brief Admission History:  88 y.o. male with medical history significant of hypertension, hyperlipidemia, T2DM, ESRD (TTS), GERD, prostate cancer who presents to the emergency department due to 2-day onset of intermittent episodes of dizziness which presents as room spinning with position change, patient also complained of sensation of fullness or ringing in his right ear.  Last dialysis was  (11/25) and he had full dialysis, however, he needed help to be able to get in and out of the vehicle, this is a change from baseline however a uses a walker without any other assistive device.  He denies fever, chills, chest pain, shortness of breath, nausea or vomiting. He reports no prior history of vertigo or dizziness like this.     ED course In the emergency department, BP was 121/58, other vital signs were within normal range.  Workup in the ED showed microcytic anemia.  BMP was normal except for blood glucose of 136, BUN 31, creatinine 6.63, eGFR 8.  Troponin 80 > 84 > 83.  Urinalysis was normal.  Influenza A, B, SARS coronavirus 2, RSV was negative.  MRI brain without contrast showed no acute intracranial abnormality.    Assessment and Plan:  Dizziness Suspect this is BPPV Continue meclizine  PRN Continue PT eval and treat for vestibular evaluation Pt having less episodes since working with PT on vestibular rehab Added fall precautions Added neuro checks Added orthostatic vitals Q shift carotid doppler study:  Moderate stenosis of left ICA, mild stenosis right ICA, patent vertebrals TTE 11/28: LVEF 70-75% no RWMAs, grade 1 DD, normal RV function --he likely does have vertigo and may need ongoing vestibular rehab --was going to discharge 11/29 however patient would like to see cardiology, who won't be  available on this campus until 12/1.  --his symptoms are much improved now and he likely can go home tomorrow and have dialysis on 12/2 at his outpatient center   Generalized weakness Continue management as described above  Sinus bradycardia  -- isolated HRs in the 40-50 range seen on tele -- we have reduced his metoprolol  succinate to 12.5 mg down from 50 mg with hold parameters for HR<65 or SBP<110 in case this is contributing to his dizziness   HTN -- we have reduced his BP meds due to noticing softer BPs as well as bradycardia to be sure this isn't causing his symptoms---see orders   Elevated troponin possibly secondary to type II demand ischemia Troponin 80 > 84 > 83.  Patient denies chest pain Continue telemetry Cardiology was consulted but not available on this campus until 09/12/24.  He has no chest pain symptoms.  I feel ACS is ruled out.  Monitor for now.    Microcytic anemia/anemia of chronic disease MCV 79, iron  studies will be done   ESRD on HD (TTS) Nephrology consulted with plan for HD 11/28 and 11/29 to stay on schedule   Type 2 diabetes mellitus Hemoglobin A1c on 03/29/2024 was 5.1 Continue diet modification at this time Repeat A1c 5.8%    DVT prophylaxis: sq heparin  Code Status: Full  Family Communication:  Disposition: home with [facility] on 12/1 if ok with cardiology     Consultants:  Cardiology to see on 12/1  Procedures:  Hemodialysis on 11/28, 11/29  Antimicrobials:    Subjective: Pt says that the  vestibular maneuvers he learned from PT has been helping. He says he still wants to wait to see cardiology on Monday.  He says he does not want SNF and will accept home health services.    Objective: Vitals:   09/10/24 2014 09/10/24 2116 09/11/24 0436 09/11/24 0909  BP: (!) 105/54 (!) 114/56 (!) 142/59 (!) 121/55  Pulse: 72  68 67  Resp: 17  20   Temp: 98 F (36.7 C)  98.1 F (36.7 C) (!) 97.5 F (36.4 C)  TempSrc: Oral  Oral Oral  SpO2: 98%  98% 98%   Weight:        Intake/Output Summary (Last 24 hours) at 09/11/2024 1257 Last data filed at 09/11/2024 0910 Gross per 24 hour  Intake 238 ml  Output --  Net 238 ml   Filed Weights   09/09/24 1447 09/09/24 1824 09/10/24 1241  Weight: 75.3 kg 73 kg 71.5 kg   Examination:  General exam: Appears calm and comfortable  Respiratory system: Clear to auscultation. Respiratory effort normal. Cardiovascular system: normal S1 & S2 heard. No JVD, murmurs, rubs, gallops or clicks. No pedal edema. Gastrointestinal system: Abdomen is nondistended, soft and nontender. No organomegaly or masses felt. Normal bowel sounds heard. Central nervous system: Alert and oriented. No focal neurological deficits. Extremities: Symmetric 5 x 5 power. Skin: No rashes, lesions or ulcers. Psychiatry: Judgement and insight appear normal. Mood & affect appropriate.   Data Reviewed: I have personally reviewed following labs and imaging studies  CBC: Recent Labs  Lab 09/07/24 1254 09/08/24 0443 09/09/24 0457 09/10/24 0402  WBC 6.5 4.9 5.9 5.8  HGB 11.9* 10.9* 11.0* 11.2*  HCT 35.7* 32.7* 33.3* 32.3*  MCV 79.0* 78.4* 79.1* 77.5*  PLT 205 174 173 168    Basic Metabolic Panel: Recent Labs  Lab 09/07/24 1254 09/07/24 2247 09/08/24 0443 09/09/24 0457 09/10/24 0402  NA 140  --  144 143 139  K 3.7  --  3.8 4.0 3.9  CL 99  --  105 107 100  CO2 28  --  28 25 28   GLUCOSE 136*  --  78 80 95  BUN 31*  --  38* 46* 33*  CREATININE 6.63*  --  7.65* 8.94* 6.66*  CALCIUM 9.4  --  8.8* 8.7* 8.7*  MG  --  1.7  --  1.7  --   PHOS  --  2.2*  --  2.9 2.6    CBG: Recent Labs  Lab 09/07/24 1215  GLUCAP 134*    Recent Results (from the past 240 hours)  Resp panel by RT-PCR (RSV, Flu A&B, Covid) Anterior Nasal Swab     Status: None   Collection Time: 09/07/24  2:44 PM   Specimen: Anterior Nasal Swab  Result Value Ref Range Status   SARS Coronavirus 2 by RT PCR NEGATIVE NEGATIVE Final    Comment:  (NOTE) SARS-CoV-2 target nucleic acids are NOT DETECTED.  The SARS-CoV-2 RNA is generally detectable in upper respiratory specimens during the acute phase of infection. The lowest concentration of SARS-CoV-2 viral copies this assay can detect is 138 copies/mL. A negative result does not preclude SARS-Cov-2 infection and should not be used as the sole basis for treatment or other patient management decisions. A negative result may occur with  improper specimen collection/handling, submission of specimen other than nasopharyngeal swab, presence of viral mutation(s) within the areas targeted by this assay, and inadequate number of viral copies(<138 copies/mL). A negative result must be combined with clinical  observations, patient history, and epidemiological information. The expected result is Negative.  Fact Sheet for Patients:  bloggercourse.com  Fact Sheet for Healthcare Providers:  seriousbroker.it  This test is no t yet approved or cleared by the [place]  FDA and  has been authorized for detection and/or diagnosis of SARS-CoV-2 by FDA under an Emergency Use Authorization (EUA). This EUA will remain  in effect (meaning this test can be used) for the duration of the COVID-19 declaration under Section 564(b)(1) of the Act, 21 U.S.C.section 360bbb-3(b)(1), unless the authorization is terminated  or revoked sooner.       Influenza A by PCR NEGATIVE NEGATIVE Final   Influenza B by PCR NEGATIVE NEGATIVE Final    Comment: (NOTE) The Xpert Xpress SARS-CoV-2/FLU/RSV plus assay is intended as an aid in the diagnosis of influenza from Nasopharyngeal swab specimens and should not be used as a sole basis for treatment. Nasal washings and aspirates are unacceptable for Xpert Xpress SARS-CoV-2/FLU/RSV testing.  Fact Sheet for Patients: bloggercourse.com  Fact Sheet for Healthcare  Providers: seriousbroker.it  This test is not yet approved or cleared by the [place]  FDA and has been authorized for detection and/or diagnosis of SARS-CoV-2 by FDA under an Emergency Use Authorization (EUA). This EUA will remain in effect (meaning this test can be used) for the duration of the COVID-19 declaration under Section 564(b)(1) of the Act, 21 U.S.C. section 360bbb-3(b)(1), unless the authorization is terminated or revoked.     Resp Syncytial Virus by PCR NEGATIVE NEGATIVE Final    Comment: (NOTE) Fact Sheet for Patients: bloggercourse.com  Fact Sheet for Healthcare Providers: seriousbroker.it  This test is not yet approved or cleared by the [place]  FDA and has been authorized for detection and/or diagnosis of SARS-CoV-2 by FDA under an Emergency Use Authorization (EUA). This EUA will remain in effect (meaning this test can be used) for the duration of the COVID-19 declaration under Section 564(b)(1) of the Act, 21 U.S.C. section 360bbb-3(b)(1), unless the authorization is terminated or revoked.  Performed at [facility], [address], [place], [place] [address]      Radiology Studies: No results found.  Scheduled Meds:  aspirin   81 mg Oral Daily   Chlorhexidine  Gluconate Cloth  6 each Topical Q0600   gabapentin   100 mg Oral QHS   heparin   5,000 Units Subcutaneous Q8H   hydrALAZINE   50 mg Oral BID   linagliptin   5 mg Oral Daily   metoprolol  succinate  12.5 mg Oral Daily   NIFEdipine   30 mg Oral Daily   pantoprazole   40 mg Oral QAC breakfast   tamsulosin   0.4 mg Oral QPC supper   Continuous Infusions:   LOS: 0 days   Time spent: 55 mins  Miko Sirico Vicci, MD How to contact the [facility] Attending or Consulting provider 7A - 7P or covering provider during after hours 7P -7A, for this patient?  Check the care team in [facility] and look for a) attending/consulting [facility] provider  listed and b) the [facility] team listed Log into [email or web address] to find provider on call.  Locate the [facility] provider you are looking for under [facility] and page to a number that you can be directly reached. If you still have difficulty reaching the provider, please page the [facility] (Director on Call) for the Hospitalists listed on amion for assistance.  09/11/2024, 12:57 PM

## 2024-09-11 NOTE — Plan of Care (Signed)

## 2024-09-12 DIAGNOSIS — Z992 Dependence on renal dialysis: Secondary | ICD-10-CM | POA: Diagnosis not present

## 2024-09-12 DIAGNOSIS — N186 End stage renal disease: Secondary | ICD-10-CM | POA: Diagnosis not present

## 2024-09-12 DIAGNOSIS — I10 Essential (primary) hypertension: Secondary | ICD-10-CM

## 2024-09-12 DIAGNOSIS — R7989 Other specified abnormal findings of blood chemistry: Secondary | ICD-10-CM

## 2024-09-12 DIAGNOSIS — R42 Dizziness and giddiness: Secondary | ICD-10-CM | POA: Diagnosis not present

## 2024-09-12 LAB — CBC
HCT: 32.9 % — ABNORMAL LOW (ref 39.0–52.0)
Hemoglobin: 11.2 g/dL — ABNORMAL LOW (ref 13.0–17.0)
MCH: 26.4 pg (ref 26.0–34.0)
MCHC: 34 g/dL (ref 30.0–36.0)
MCV: 77.6 fL — ABNORMAL LOW (ref 80.0–100.0)
Platelets: 160 K/uL (ref 150–400)
RBC: 4.24 MIL/uL (ref 4.22–5.81)
RDW: 17.5 % — ABNORMAL HIGH (ref 11.5–15.5)
WBC: 5.6 K/uL (ref 4.0–10.5)
nRBC: 0 % (ref 0.0–0.2)

## 2024-09-12 LAB — RENAL FUNCTION PANEL
Albumin: 3.6 g/dL (ref 3.5–5.0)
Anion gap: 13 (ref 5–15)
BUN: 51 mg/dL — ABNORMAL HIGH (ref 8–23)
CO2: 26 mmol/L (ref 22–32)
Calcium: 8.7 mg/dL — ABNORMAL LOW (ref 8.9–10.3)
Chloride: 100 mmol/L (ref 98–111)
Creatinine, Ser: 8.55 mg/dL — ABNORMAL HIGH (ref 0.61–1.24)
GFR, Estimated: 6 mL/min — ABNORMAL LOW (ref >60)
Glucose, Bld: 127 mg/dL — ABNORMAL HIGH (ref 70–99)
Phosphorus: 4 mg/dL (ref 2.5–4.6)
Potassium: 3.8 mmol/L (ref 3.5–5.1)
Sodium: 140 mmol/L (ref 135–145)

## 2024-09-12 MED ORDER — NIFEDIPINE ER 60 MG PO TB24: 60.0000 mg | ORAL_TABLET | Freq: Every day | ORAL | 2 refills | Status: AC

## 2024-09-12 MED ORDER — HYDRALAZINE HCL 50 MG PO TABS: 50.0000 mg | ORAL_TABLET | Freq: Two times a day (BID) | ORAL | 2 refills | Status: AC

## 2024-09-12 MED ORDER — MECLIZINE HCL 25 MG PO TABS: 25.0000 mg | ORAL_TABLET | Freq: Three times a day (TID) | ORAL | 0 refills | Status: AC | PRN

## 2024-09-12 MED ORDER — METOPROLOL SUCCINATE ER 25 MG PO TB24: 12.5000 mg | ORAL_TABLET | Freq: Every day | ORAL | 2 refills | Status: AC

## 2024-09-12 NOTE — Discharge Summary (Signed)
 Physician Discharge Summary   Patient: Steve [name] MRN: [id number] DOB: 01-May-1936  Admit date:     09/07/2024  Discharge date: 09/12/24  Discharge Physician: Eric Nunnery   PCP: Carlette Benita Area, MD   Recommendations at discharge:  Reassess blood pressure and adjust antihypertensive regimen as needed Repeat CBC to follow hemoglobin trend/stability Reassess patient response to the use of meclizine  and make sure he continue vestibular training management.   Discharge Diagnoses: Principal Problem:   Dizziness   Brief Hospital admission narrative: 88 y.o. male with medical history significant of hypertension, hyperlipidemia, T2DM, ESRD (TTS), GERD, prostate cancer who presents to the emergency department due to 2-day onset of intermittent episodes of dizziness which presents as room spinning with position change, patient also complained of sensation of fullness or ringing in his right ear.  Last dialysis was  (11/25) and he had full dialysis, however, he needed help to be able to get in and out of the vehicle, this is a change from baseline however a uses a walker without any other assistive device.  He denies fever, chills, chest pain, shortness of breath, nausea or vomiting. He reports no prior history of vertigo or dizziness like this.     ED course In the emergency department, BP was 121/58, other vital signs were within normal range.  Workup in the ED showed microcytic anemia.  BMP was normal except for blood glucose of 136, BUN 31, creatinine 6.63, eGFR 8.  Troponin 80 > 84 > 83.  Urinalysis was normal.  Influenza A, B, SARS coronavirus 2, RSV was negative.  MRI brain without contrast showed no acute intracranial abnormality.   Assessment and Plan: Dizziness Suspect this is BPPV Continue meclizine  PRN Continue PT eval and treat for vestibular evaluation Pt having less episodes since working with PT on vestibular rehab Added fall precautions Added neuro checks Added  orthostatic vitals Q shift carotid doppler study:  Moderate stenosis of left ICA, mild stenosis right ICA, patent vertebrals TTE 11/28: LVEF 70-75% no RWMAs, grade 1 DD, normal RV function --he likely does have vertigo and may need ongoing vestibular rehab --was going to discharge 11/29 however patient would like to see cardiology, who won't be available on this campus until 12/1.  --his symptoms are much improved now and he likely can go home tomorrow and have dialysis on 12/2 at his outpatient center    Generalized weakness -Continue management as described above   Sinus bradycardia  -- isolated HRs in the 40-50 range seen on tele --patient's metoprolol  dosage has been adjusted - No symptomatic bradycardia appreciated - Outpatient follow-up with cardiology service to determine the need for event monitoring recommended.   HTN -Patient blood pressure has remained stable and well-controlled with adjusted antihypertensive regimen - Continue to follow vital signs.   Elevated troponin possibly secondary to type II demand ischemia Troponin 80 > 84 > 83.  Patient denies chest pain Appreciate assistance and recommendation by cardiology service. He has no chest pain symptoms.   ACS has been ruled out and no further ischemic workup needed.   Microcytic anemia/anemia of chronic disease MCV 79, iron  studies will be done Repeat CBC to follow hemoglobin trend/stability.   ESRD on HD (TTS) Appreciate assistance and recommendation by nephrology service - Resume outpatient hemodialysis schedule (next treatment 12-25).  Type 2 diabetes mellitus Hemoglobin A1c on 03/29/2024 was 5.1 Continue diet modification at this time Repeat A1c 5.8%  Continue patient follow-up with PCP to follow CBG fluctuation and adjust medication  as needed.   Consultants: Nephrology service and cardiology service. Procedures performed: See below for x-ray report; 2D echo. Disposition: Home with home health  services. Diet recommendation: Heart healthy/low-sodium and modified carbohydrate diet.  DISCHARGE MEDICATION: Allergies as of 09/12/2024   No Known Allergies      Medication List     STOP taking these medications    NYQUIL PO       TAKE these medications    acetaminophen  500 MG tablet Commonly known as: TYLENOL  Take 1,000 mg by mouth every 6 (six) hours as needed for mild pain (pain score 1-3).   aspirin  81 MG chewable tablet Chew 81 mg by mouth daily.   DRY EYE RELIEF OP Place 2 drops into both eyes daily as needed (dry eyes).   gabapentin  100 MG capsule Commonly known as: NEURONTIN  Take 100 mg by mouth at bedtime.   hydrALAZINE  50 MG tablet Commonly known as: APRESOLINE  Take 1 tablet (50 mg total) by mouth 2 (two) times daily. What changed:  medication strength how much to take   linagliptin  5 MG Tabs tablet Commonly known as: TRADJENTA  Take 5 mg by mouth daily.   meclizine  25 MG tablet Commonly known as: ANTIVERT  Take 1 tablet (25 mg total) by mouth 3 (three) times daily as needed for dizziness.   metoprolol  succinate 25 MG 24 hr tablet Commonly known as: TOPROL -XL Take 0.5 tablets (12.5 mg total) by mouth daily. Start taking on: September 13, 2024 What changed:  medication strength how much to take   NIFEdipine  60 MG 24 hr tablet Commonly known as: ADALAT  CC Take 1 tablet (60 mg total) by mouth daily. Start taking on: September 13, 2024 What changed:  medication strength how much to take   pantoprazole  40 MG tablet Commonly known as: PROTONIX  Take 1 tablet (40 mg total) by mouth daily before breakfast.   tamsulosin  0.4 MG Caps capsule Commonly known as: FLOMAX  Take 0.4 mg by mouth daily.   traZODone 50 MG tablet Commonly known as: DESYREL Take 50 mg by mouth at bedtime as needed for sleep.        Contact information for follow-up providers     Fanta, Tesfaye Demissie, MD. Schedule an appointment as soon as possible for a visit in 10  day(s).   Specialty: Internal Medicine Contact information: [address] [place] [address] [phone]              Contact information for after-discharge care     Home Medical Care     [facility]) .   Service: Home Health Services Contact information: [address] [place]  [address] [phone]                    Discharge Exam: Fredricka Weights   09/09/24 1447 09/09/24 1824 09/10/24 1241  Weight: 75.3 kg 73 kg 71.5 kg   General exam: Appears calm and comfortable  Respiratory system: Clear to auscultation. Respiratory effort normal. Cardiovascular system: normal S1 & S2 heard. No JVD, murmurs, rubs, gallops or clicks. No pedal edema. Gastrointestinal system: Abdomen is nondistended, soft and nontender. No organomegaly or masses felt. Normal bowel sounds heard. Central nervous system: Alert and oriented. No focal neurological deficits. Extremities: Moving 4 limbs spontaneously; no focal deficits. Skin: No rashes or petechiae. Psychiatry: Judgement and insight appear normal. Mood & affect appropriate.     Condition at discharge: Stable and improved.  The results of significant diagnostics from  this hospitalization (including imaging, microbiology, ancillary and laboratory) are listed below for reference.   Imaging Studies: US  Venous Img Upper Uni Right(DVT) Result Date: 09/11/2024 CLINICAL DATA:  Right upper extremity pain and swelling EXAM: RIGHT UPPER EXTREMITY VENOUS DOPPLER ULTRASOUND TECHNIQUE: Gray-scale sonography with graded compression, as well as color Doppler and duplex ultrasound were performed to evaluate the upper extremity deep venous system from the level of the subclavian vein and including the jugular, axillary, basilic, radial, ulnar and upper cephalic vein. Spectral Doppler was utilized to evaluate flow at rest and with distal augmentation maneuvers. COMPARISON:  None  Available. FINDINGS: Contralateral Subclavian Vein: Respiratory phasicity is normal and symmetric with the symptomatic side. No evidence of thrombus. Normal compressibility. Internal Jugular Vein: No evidence of thrombus. Normal compressibility, respiratory phasicity and response to augmentation. Subclavian Vein: No evidence of thrombus. Normal compressibility, respiratory phasicity and response to augmentation. Axillary Vein: No evidence of thrombus. Normal compressibility, respiratory phasicity and response to augmentation. Cephalic Vein: No evidence of thrombus. Normal compressibility, respiratory phasicity and response to augmentation. Basilic Vein: No evidence of thrombus. Normal compressibility, respiratory phasicity and response to augmentation. Brachial Veins: No evidence of thrombus. Normal compressibility, respiratory phasicity and response to augmentation. Radial Veins: No evidence of thrombus. Normal compressibility, respiratory phasicity and response to augmentation. Ulnar Veins: No evidence of thrombus. Normal compressibility, respiratory phasicity and response to augmentation. Venous Reflux:  None visualized. Other Findings:  None visualized. IMPRESSION: No evidence of DVT within the right upper extremity. Electronically Signed   By: Wilkie Lent M.D.   On: 09/11/2024 15:26   ECHOCARDIOGRAM COMPLETE Result Date: 09/10/2024    ECHOCARDIOGRAM REPORT   Patient Name:   RAYSEAN GRAUMANN Date of Exam: 09/09/2024 Medical Rec #:  [id number]      Height:       65.0 in Accession #:    [id number]     Weight:       168.0 lb Date of Birth:  Nov 16, 1935      BSA:          1.837 m Patient Age:    88 years       BP:           169/70 mmHg Patient Gender: M              HR:           64 bpm. Exam Location:  [facility] Procedure: 2D Echo, Cardiac Doppler and Color Doppler (Both Spectral and Color            Flow Doppler were utilized during procedure). Indications:    Abnormal ECG R94.31  History:        Patient  has prior history of Echocardiogram examinations, most                 recent 04/18/2020. Risk Factors:Hypertension, Diabetes and                 Dyslipidemia. Hx of ESRD on dailysis.  Sonographer:    Aida Pizza RCS Referring Phys: [phone] [place] L JOHNSON IMPRESSIONS  1. Left ventricular ejection fraction, by estimation, is 70 to 75%. The left ventricle has hyperdynamic function. The left ventricle has no regional wall motion abnormalities. There is moderate left ventricular hypertrophy. Left ventricular diastolic parameters are consistent with Grade I diastolic dysfunction (impaired relaxation).  2. Right ventricular systolic function is normal. The right ventricular size is normal. There is moderately elevated pulmonary artery systolic pressure. The estimated right ventricular  systolic pressure is 50.8 mmHg.  3. Right atrial size was moderately dilated.  4. The mitral valve is degenerative. Trivial mitral valve regurgitation.  5. The aortic valve is tricuspid. Aortic valve regurgitation is not visualized. Aortic valve sclerosis/calcification is present, without any evidence of aortic stenosis.  6. The inferior vena cava is normal in size with <50% respiratory variability, suggesting right atrial pressure of 8 mmHg. Comparison(s): Changes from prior study are noted. 04/18/2020: LVEF 65-70%, grade 2 DD, RVSP 46.8 mmHg. FINDINGS  Left Ventricle: Left ventricular ejection fraction, by estimation, is 70 to 75%. The left ventricle has hyperdynamic function. The left ventricle has no regional wall motion abnormalities. The left ventricular internal cavity size was normal in size. There is moderate left ventricular hypertrophy. Left ventricular diastolic parameters are consistent with Grade I diastolic dysfunction (impaired relaxation). Indeterminate filling pressures. Right Ventricle: The right ventricular size is normal. No increase in right ventricular wall thickness. Right ventricular systolic function is normal. There  is moderately elevated pulmonary artery systolic pressure. The tricuspid regurgitant velocity is 3.27 m/s, and with an assumed right atrial pressure of 8 mmHg, the estimated right ventricular systolic pressure is 50.8 mmHg. Left Atrium: Left atrial size was normal in size. Right Atrium: Right atrial size was moderately dilated. Pericardium: There is no evidence of pericardial effusion. Mitral Valve: The mitral valve is degenerative in appearance. Mild to moderate mitral annular calcification. Trivial mitral valve regurgitation. Tricuspid Valve: The tricuspid valve is grossly normal. Tricuspid valve regurgitation is mild. Aortic Valve: The aortic valve is tricuspid. Aortic valve regurgitation is not visualized. Aortic valve sclerosis/calcification is present, without any evidence of aortic stenosis. Pulmonic Valve: The pulmonic valve was grossly normal. Pulmonic valve regurgitation is trivial. Aorta: The aortic root and ascending aorta are structurally normal, with no evidence of dilitation. Venous: The inferior vena cava is normal in size with less than 50% respiratory variability, suggesting right atrial pressure of 8 mmHg. IAS/Shunts: No atrial level shunt detected by color flow Doppler.  LEFT VENTRICLE PLAX 2D LVIDd:         4.20 cm   Diastology LVIDs:         2.20 cm   LV e' medial:    8.95 cm/s LV PW:         1.30 cm   LV E/e' medial:  11.7 LV IVS:        1.40 cm   LV e' lateral:   8.58 cm/s LVOT diam:     1.90 cm   LV E/e' lateral: 12.2 LV SV:         74 LV SV Index:   40 LVOT Area:     2.84 cm  RIGHT VENTRICLE RV S prime:     15.00 cm/s TAPSE (M-mode): 2.7 cm LEFT ATRIUM             Index        RIGHT ATRIUM           Index LA diam:        3.50 cm 1.91 cm/m   RA Area:     20.40 cm LA Vol (A2C):   62.8 ml 34.19 ml/m  RA Volume:   60.80 ml  33.10 ml/m LA Vol (A4C):   54.1 ml 29.45 ml/m LA Biplane Vol: 60.9 ml 33.15 ml/m  AORTIC VALVE LVOT Vmax:   119.00 cm/s LVOT Vmean:  74.000 cm/s LVOT VTI:    0.262 m   AORTA Ao Root diam: 3.60 cm MITRAL  VALVE                TRICUSPID VALVE MV Area (PHT): 3.60 cm     TR Peak grad:   42.8 mmHg MV Decel Time: 211 msec     TR Vmax:        327.00 cm/s MR Peak grad: 108.2 mmHg MR Mean grad: 78.0 mmHg     SHUNTS MR Vmax:      520.00 cm/s   Systemic VTI:  0.26 m MR Vmean:     423.0 cm/s    Systemic Diam: 1.90 cm MV E velocity: 105.00 cm/s MV A velocity: 99.90 cm/s MV E/A ratio:  1.05 Vinie Maxcy MD Electronically signed by Vinie Maxcy MD Signature Date/Time: 09/10/2024/12:07:15 PM    Final    US  Carotid Bilateral Result Date: 09/09/2024 CLINICAL DATA:  Dizziness EXAM: BILATERAL CAROTID DUPLEX ULTRASOUND TECHNIQUE: Elnor scale imaging, color Doppler and duplex ultrasound were performed of bilateral carotid and vertebral arteries in the neck. COMPARISON:  None Available. FINDINGS: Criteria: Quantification of carotid stenosis is based on velocity parameters that correlate the residual internal carotid diameter with NASCET-based stenosis levels, using the diameter of the distal internal carotid lumen as the denominator for stenosis measurement. The following velocity measurements were obtained: RIGHT ICA: 199/18 cm/sec CCA: 101/7 cm/sec SYSTOLIC ICA/CCA RATIO:  2.0 ECA:  269 cm/sec LEFT ICA: 93/19 cm/sec CCA: 107/6 cm/sec SYSTOLIC ICA/CCA RATIO:  0.9 ECA:  127 cm/sec RIGHT CAROTID ARTERY: Heterogeneous atherosclerotic plaque in the proximal internal carotid artery. By peak systolic velocity criteria, the estimated stenosis falls in the 50-69% diameter range. There is even greater elevation of the peak systolic velocity at the origin of the external carotid artery consistent with a greater than 60% diameter stenosis. RIGHT VERTEBRAL ARTERY:  Patent with normal antegrade flow. LEFT CAROTID ARTERY: Heterogeneous atherosclerotic plaque in the proximal internal carotid artery. By peak systolic velocity criteria, the estimated stenosis is less than 50%. LEFT VERTEBRAL ARTERY:  Patent with  normal antegrade flow. IMPRESSION: 1. Moderate (50-69%) stenosis proximal right internal carotid artery secondary to heterogenous atherosclerotic plaque. 2. Mild (1-49%) stenosis proximal left internal carotid artery secondary to heterogenous atherosclerotic plaque. 3. Vertebral arteries are patent with normal antegrade flow. Electronically Signed   By: Wilkie Lent M.D.   On: 09/09/2024 15:53   MR BRAIN WO CONTRAST Result Date: 09/07/2024 CLINICAL DATA:  Initial evaluation for acute TIA, dizziness. EXAM: MRI HEAD WITHOUT CONTRAST TECHNIQUE: Multiplanar, multiecho pulse sequences of the brain and surrounding structures were obtained without intravenous contrast. COMPARISON:  CT from 04/18/2020. FINDINGS: Brain: Cerebral volume within normal limits. Patchy T2/FLAIR hyperintensity involving the periventricular and deep white matter both cerebral hemispheres, consistent with chronic small vessel ischemic disease, mild for age. No evidence for acute or subacute infarct. No areas of chronic cortical infarction. No acute or significant chronic intracranial blood products. No mass lesion, midline shift or mass effect. No hydrocephalus or extra-axial fluid collection. Pituitary gland within normal limits. Vascular: Major intracranial vascular flow voids are maintained. Skull and upper cervical spine: Craniocervical junction within normal limits. Decreased T1 signal intensity noted within the visualized bone marrow, nonspecific, but most commonly related to anemia, smoking or obesity. Prior ACDF at C3-4 noted. No scalp soft tissue abnormality. Sinuses/Orbits: Prior bilateral ocular lens replacement. Paranasal sinuses are largely clear. No significant mastoid effusion. Other: None. IMPRESSION: 1. No acute intracranial abnormality. 2. Mild chronic microvascular ischemic disease for age. Electronically Signed   By: Morene Nicholes HERO.D.  On: 09/07/2024 17:49    Microbiology: Results for orders placed or  performed during the hospital encounter of 09/07/24  Resp panel by RT-PCR (RSV, Flu A&B, Covid) Anterior Nasal Swab     Status: None   Collection Time: 09/07/24  2:44 PM   Specimen: Anterior Nasal Swab  Result Value Ref Range Status   SARS Coronavirus 2 by RT PCR NEGATIVE NEGATIVE Final    Comment: (NOTE) SARS-CoV-2 target nucleic acids are NOT DETECTED.  The SARS-CoV-2 RNA is generally detectable in upper respiratory specimens during the acute phase of infection. The lowest concentration of SARS-CoV-2 viral copies this assay can detect is 138 copies/mL. A negative result does not preclude SARS-Cov-2 infection and should not be used as the sole basis for treatment or other patient management decisions. A negative result may occur with  improper specimen collection/handling, submission of specimen other than nasopharyngeal swab, presence of viral mutation(s) within the areas targeted by this assay, and inadequate number of viral copies(<138 copies/mL). A negative result must be combined with clinical observations, patient history, and epidemiological information. The expected result is Negative.  Fact Sheet for Patients:  bloggercourse.com  Fact Sheet for Healthcare Providers:  seriousbroker.it  This test is no t yet approved or cleared by the [place]  FDA and  has been authorized for detection and/or diagnosis of SARS-CoV-2 by FDA under an Emergency Use Authorization (EUA). This EUA will remain  in effect (meaning this test can be used) for the duration of the COVID-19 declaration under Section 564(b)(1) of the Act, 21 U.S.C.section 360bbb-3(b)(1), unless the authorization is terminated  or revoked sooner.       Influenza A by PCR NEGATIVE NEGATIVE Final   Influenza B by PCR NEGATIVE NEGATIVE Final    Comment: (NOTE) The Xpert Xpress SARS-CoV-2/FLU/RSV plus assay is intended as an aid in the diagnosis of influenza from  Nasopharyngeal swab specimens and should not be used as a sole basis for treatment. Nasal washings and aspirates are unacceptable for Xpert Xpress SARS-CoV-2/FLU/RSV testing.  Fact Sheet for Patients: bloggercourse.com  Fact Sheet for Healthcare Providers: seriousbroker.it  This test is not yet approved or cleared by the [place]  FDA and has been authorized for detection and/or diagnosis of SARS-CoV-2 by FDA under an Emergency Use Authorization (EUA). This EUA will remain in effect (meaning this test can be used) for the duration of the COVID-19 declaration under Section 564(b)(1) of the Act, 21 U.S.C. section 360bbb-3(b)(1), unless the authorization is terminated or revoked.     Resp Syncytial Virus by PCR NEGATIVE NEGATIVE Final    Comment: (NOTE) Fact Sheet for Patients: bloggercourse.com  Fact Sheet for Healthcare Providers: seriousbroker.it  This test is not yet approved or cleared by the [place]  FDA and has been authorized for detection and/or diagnosis of SARS-CoV-2 by FDA under an Emergency Use Authorization (EUA). This EUA will remain in effect (meaning this test can be used) for the duration of the COVID-19 declaration under Section 564(b)(1) of the Act, 21 U.S.C. section 360bbb-3(b)(1), unless the authorization is terminated or revoked.  Performed at [facility], [address], [place], [place] [address]     Labs: CBC: Recent Labs  Lab 09/07/24 1254 09/08/24 0443 09/09/24 0457 09/10/24 0402 09/12/24 0448  WBC 6.5 4.9 5.9 5.8 5.6  HGB 11.9* 10.9* 11.0* 11.2* 11.2*  HCT 35.7* 32.7* 33.3* 32.3* 32.9*  MCV 79.0* 78.4* 79.1* 77.5* 77.6*  PLT 205 174 173 168 160   Basic Metabolic Panel: Recent Labs  Lab 09/07/24 1254 09/07/24 2247 09/08/24 0443 09/09/24 0457 09/10/24 0402 09/12/24 0448  NA 140  --  144 143 139 140  K 3.7  --  3.8 4.0 3.9  3.8  CL 99  --  105 107 100 100  CO2 28  --  28 25 28 26   GLUCOSE 136*  --  78 80 95 127*  BUN 31*  --  38* 46* 33* 51*  CREATININE 6.63*  --  7.65* 8.94* 6.66* 8.55*  CALCIUM 9.4  --  8.8* 8.7* 8.7* 8.7*  MG  --  1.7  --  1.7  --   --   PHOS  --  2.2*  --  2.9 2.6 4.0   Liver Function Tests: Recent Labs  Lab 09/07/24 1254 09/08/24 0443 09/09/24 0457 09/10/24 0402 09/12/24 0448  AST 24 18  --   --   --   ALT 11 10  --   --   --   ALKPHOS 102 86  --   --   --   BILITOT 0.4 0.4  --   --   --   PROT 7.9 6.6  --   --   --   ALBUMIN 4.3 3.5 3.6 3.8 3.6   CBG: Recent Labs  Lab 09/07/24 1215  GLUCAP 134*    Discharge time spent:  35 minutes.  Signed: Eric Nunnery, MD [facility] 09/12/2024

## 2024-09-12 NOTE — Progress Notes (Signed)
 Patient ID: Steve [name], male   DOB: 09-02-1936, 88 y.o.   MRN: [id number] S: No new complaints O:BP (!) 142/60 (BP Location: Right Arm)   Pulse 68   Temp 98.7 F (37.1 C) (Oral)   Resp 14   Wt 71.5 kg   SpO2 98%   BMI 26.23 kg/m   Intake/Output Summary (Last 24 hours) at 09/12/2024 0914 Last data filed at 09/12/2024 0438 Gross per 24 hour  Intake 120 ml  Output 400 ml  Net -280 ml   Intake/Output: I/O last 3 completed shifts: In: 358 [P.O.:358] Out: 400 [Urine:400]  Intake/Output this shift:  No intake/output data recorded. Weight change:  Gen: NAD CVS: RRR Resp:CTA Abd: +BS, soft, NT/ND Ext: no edema, LAVG +T/B  Recent Labs  Lab 09/07/24 1254 09/07/24 2247 09/08/24 0443 09/09/24 0457 09/10/24 0402 09/12/24 0448  NA 140  --  144 143 139 140  K 3.7  --  3.8 4.0 3.9 3.8  CL 99  --  105 107 100 100  CO2 28  --  28 25 28 26   GLUCOSE 136*  --  78 80 95 127*  BUN 31*  --  38* 46* 33* 51*  CREATININE 6.63*  --  7.65* 8.94* 6.66* 8.55*  ALBUMIN 4.3  --  3.5 3.6 3.8 3.6  CALCIUM 9.4  --  8.8* 8.7* 8.7* 8.7*  PHOS  --  2.2*  --  2.9 2.6 4.0  AST 24  --  18  --   --   --   ALT 11  --  10  --   --   --    Liver Function Tests: Recent Labs  Lab 09/07/24 1254 09/08/24 0443 09/09/24 0457 09/10/24 0402 09/12/24 0448  AST 24 18  --   --   --   ALT 11 10  --   --   --   ALKPHOS 102 86  --   --   --   BILITOT 0.4 0.4  --   --   --   PROT 7.9 6.6  --   --   --   ALBUMIN 4.3 3.5 3.6 3.8 3.6   No results for input(s): LIPASE, AMYLASE in the last 168 hours. No results for input(s): AMMONIA in the last 168 hours. CBC: Recent Labs  Lab 09/07/24 1254 09/08/24 0443 09/09/24 0457 09/10/24 0402 09/12/24 0448  WBC 6.5 4.9 5.9 5.8 5.6  HGB 11.9* 10.9* 11.0* 11.2* 11.2*  HCT 35.7* 32.7* 33.3* 32.3* 32.9*  MCV 79.0* 78.4* 79.1* 77.5* 77.6*  PLT 205 174 173 168 160   Cardiac Enzymes: No results for input(s): CKTOTAL, CKMB, CKMBINDEX, TROPONINI in the  last 168 hours. CBG: Recent Labs  Lab 09/07/24 1215  GLUCAP 134*    Iron  Studies: No results for input(s): IRON , TIBC, TRANSFERRIN, FERRITIN in the last 72 hours. Studies/Results: US  Venous Img Upper Uni Right(DVT) Result Date: 09/11/2024 CLINICAL DATA:  Right upper extremity pain and swelling EXAM: RIGHT UPPER EXTREMITY VENOUS DOPPLER ULTRASOUND TECHNIQUE: Gray-scale sonography with graded compression, as well as color Doppler and duplex ultrasound were performed to evaluate the upper extremity deep venous system from the level of the subclavian vein and including the jugular, axillary, basilic, radial, ulnar and upper cephalic vein. Spectral Doppler was utilized to evaluate flow at rest and with distal augmentation maneuvers. COMPARISON:  None Available. FINDINGS: Contralateral Subclavian Vein: Respiratory phasicity is normal and symmetric with the symptomatic side. No evidence of thrombus. Normal compressibility. Internal Jugular Vein: No  evidence of thrombus. Normal compressibility, respiratory phasicity and response to augmentation. Subclavian Vein: No evidence of thrombus. Normal compressibility, respiratory phasicity and response to augmentation. Axillary Vein: No evidence of thrombus. Normal compressibility, respiratory phasicity and response to augmentation. Cephalic Vein: No evidence of thrombus. Normal compressibility, respiratory phasicity and response to augmentation. Basilic Vein: No evidence of thrombus. Normal compressibility, respiratory phasicity and response to augmentation. Brachial Veins: No evidence of thrombus. Normal compressibility, respiratory phasicity and response to augmentation. Radial Veins: No evidence of thrombus. Normal compressibility, respiratory phasicity and response to augmentation. Ulnar Veins: No evidence of thrombus. Normal compressibility, respiratory phasicity and response to augmentation. Venous Reflux:  None visualized. Other Findings:  None  visualized. IMPRESSION: No evidence of DVT within the right upper extremity. Electronically Signed   By: Wilkie Lent M.D.   On: 09/11/2024 15:26    aspirin   81 mg Oral Daily   Chlorhexidine  Gluconate Cloth  6 each Topical Q0600   gabapentin   100 mg Oral QHS   heparin   5,000 Units Subcutaneous Q8H   hydrALAZINE   50 mg Oral BID   linagliptin   5 mg Oral Daily   metoprolol  succinate  12.5 mg Oral Daily   NIFEdipine   30 mg Oral Daily   pantoprazole   40 mg Oral QAC breakfast   tamsulosin   0.4 mg Oral QPC supper    BMET    Component Value Date/Time   NA 140 09/12/2024 0448   K 3.8 09/12/2024 0448   CL 100 09/12/2024 0448   CO2 26 09/12/2024 0448   GLUCOSE 127 (H) 09/12/2024 0448   BUN 51 (H) 09/12/2024 0448   CREATININE 8.55 (H) 09/12/2024 0448   CALCIUM 8.7 (L) 09/12/2024 0448   CALCIUM 9.2 12/11/2020 1401   GFRNONAA 6 (L) 09/12/2024 0448   GFRAA 9 (L) 07/03/2020 1315   CBC    Component Value Date/Time   WBC 5.6 09/12/2024 0448   RBC 4.24 09/12/2024 0448   HGB 11.2 (L) 09/12/2024 0448   HCT 32.9 (L) 09/12/2024 0448   PLT 160 09/12/2024 0448   MCV 77.6 (L) 09/12/2024 0448   MCH 26.4 09/12/2024 0448   MCHC 34.0 09/12/2024 0448   RDW 17.5 (H) 09/12/2024 0448   LYMPHSABS 0.6 (L) 03/29/2024 0734   MONOABS 0.7 03/29/2024 0734   EOSABS 0.0 03/29/2024 0734   BASOSABS 0.0 03/29/2024 0734    Outpatient HD orders: [facility] TTS.  3.5 hours.  EDW 74  kg.  AVG LUE.  Flow rates: 400/500.  2K/2.5 calcium.  Heparin : 1000 units loading bolus, 700 units/hr.    Assessment/PlanJoe [name] is an 88 y.o. male with ESRD on HD TTS, HTN, HL, h/o prostate ca, DM currently admitted with dizziness and nephrology is consulted for evaluation and management of ESRD and assoc conditions.    **Dizziness: likely vertigo as testing unrevealing.  Per primary. To see cardiology today.   **ESRD on HD: usually TTS, missed Thurs being admitted.  Now back on TTS schedule.  Next HD tomorrow if he  remains an inpatient.  Tolerated HD well on Saturday.  If he d/c today he can go to outpt unit tomorrow.    **Anemia: Hb 11, mild, CTM.    **Secondary hyperparathyroidism:    Not on binder, phos and corr ca normal.     **HTN: on home meds, BP on higher side, trend with UF on HD.   Steve RONAL Sellar, MD [facility]

## 2024-09-12 NOTE — Consult Note (Signed)
 Cardiology Consultation   Patient ID: Avner Stroder MRN: [id number]; DOB: 29-Mar-1936  Admit date: 09/07/2024 Date of Consult: 09/12/2024  PCP:  Carlette Benita Area, MD   [facility] Providers Cardiologist:  Pearla Rout, MD (Inactive)        Patient Profile: Sarvesh Meddaugh is a 88 y.o. male with a hx of  palpitations (PAC's and PVC's by prior EKG tracings), HTN, HLD, Type 2 DM, ESRD on HD (TTS) and prostate cancer (s/p radiation) who is being seen 09/12/2024 for the evaluation of dizziness and elevated troponin at the request of Dr. Ricky.  History of Present Illness: Mr. Maring was last seen by heart care during hospital consult in 2021 for chest pain that was determined to be atypical.  Noted although patient had multiple cardiac risk factors, he was not a candidate for cardiac catheterization at that time given his worsening renal function and high risk for contrast-induced nephropathy.  Also noted Myoview  would not be beneficial.  Recommended medical therapy management.  Presented to [facility] ED 11/26 for dizziness, which primary team suspects is secondary to BPPV with plans of vestibular rehab.  Reduced Toprol  XL from 50 mg to 12.5 mg, hydralazine  100 mg twice daily to 50 mg twice daily, nifedipine  90 mg to 30 mg daily due to softer BPs and bradycardia.  Patient was going to be discharged 11/29 however patient requested to see cardiology.  BMP WNL except glucose 136, CR 6.61, eGFR 8; TN 80 > 84 > 83, microcytic anemia, UA WNL, respiratory viral panel negative.  EKG: NSR, HR 65, new inverted T waves in inferior lateral region  Echo 09/09/2024 showed EF 70 to 75%, moderate LVH, G1 DD, moderately elevated PASP with RVSP of 50.8 mmHg, moderately dilated RA, trivial MV regurgitation, AV sclerosis/calcification without AS Carotid ultrasound 09/09/2024 showed mild stenosis in LICA at 1 to 49% in moderate stenosis in RICA at 50 to 69%.  Venous ultrasound right LE negative for  DVT MRI brain showed mild chronic microvascular ischemic disease for age without any acute findings.  On interview, patient is accompanied by son. Reports dizziness like room spinning associated with position changes, head turning and while laying in bed since Monday.  Denies any associated symptoms at that time.  Reports chronic unchanged palpitations described as skipping beat approximately 1 time a week since being on dialysis.  He has previously discussed these palpitations with nephrology who told him that it was normal.  Also reports chronic unchanged SOB that can occur at rest, however is not associated with exertion.  Also reports chronic unchanged LE edema since being on dialysis that is well-managed with dialysis treatments and leg elevation.   Denies any orthopnea, syncope.  Reports compliance with dialysis and medications.  He does not take BP meds the morning of dialysis, however resumes with evening doses and as scheduled on nondialysis day.  Past Medical History:  Diagnosis Date   Anemia    Arthritis    ESRD (end stage renal disease) on dialysis [facility])    T/TH/Sat   Essential hypertension    GERD (gastroesophageal reflux disease)    Hyperlipidemia    Prostate cancer (HCC)    took radiation   Type 2 diabetes mellitus (HCC)     Past Surgical History:  Procedure Laterality Date   ANTERIOR CERVICAL DECOMP/DISCECTOMY FUSION N/A 08/23/2020   Procedure: Cervical 3-4 Anterior cervical decompression/discectomy/fusion;  Surgeon: Unice Pac, MD;  Location: [facility] OR;  Service: Neurosurgery;  Laterality: N/A;  3C/RM 21 to follow  AV FISTULA PLACEMENT Left 02/21/2021   Procedure: INSERTION OF ARTERIOVENOUS GORE-TEX GRAFT LEFT ARM;  Surgeon: Oris Krystal FALCON, MD;  Location: [facility] ORS;  Service: Vascular;  Laterality: Left;   BIOPSY  10/22/2022   Procedure: BIOPSY;  Surgeon: Shaaron Lamar HERO, MD;  Location: [facility] ENDO SUITE;  Service: Endoscopy;;   CATARACT EXTRACTION W/PHACO Right 04/28/2016    Procedure: CATARACT EXTRACTION PHACO AND INTRAOCULAR LENS PLACEMENT (IOC);  Surgeon: Dow FALCON Burke, MD;  Location: [facility] ORS;  Service: Ophthalmology;  Laterality: Right;  CDE: 4.28   COLONOSCOPY     about 2 years in [place] around 2016   CYSTECTOMY     pt. denies   ESOPHAGOGASTRODUODENOSCOPY (EGD) WITH PROPOFOL  N/A 10/22/2022   Procedure: ESOPHAGOGASTRODUODENOSCOPY (EGD) WITH PROPOFOL ;  Surgeon: Shaaron Lamar HERO, MD;  Location: [facility] ENDO SUITE;  Service: Endoscopy;  Laterality: N/A;  12:30 pm, pt know to arrive at 9:15, LM to see if pt can come earlier   IR FLUORO GUIDE CV LINE LEFT  12/20/2020   IR US  GUIDE VASC ACCESS LEFT  12/20/2020   MALONEY DILATION N/A 10/22/2022   Procedure: AGAPITO DILATION;  Surgeon: Shaaron Lamar HERO, MD;  Location: [facility] ENDO SUITE;  Service: Endoscopy;  Laterality: N/A;     Home Medications:  Prior to Admission medications   Medication Sig Start Date End Date Taking? Authorizing Provider  acetaminophen  (TYLENOL ) 500 MG tablet Take 1,000 mg by mouth every 6 (six) hours as needed for mild pain (pain score 1-3).   Yes [provider]  aspirin  81 MG chewable tablet Chew 81 mg by mouth daily.   Yes [provider]  Carboxymethylcellulose Sodium (DRY EYE RELIEF OP) Place 2 drops into both eyes daily as needed (dry eyes).   Yes [provider]  gabapentin  (NEURONTIN ) 100 MG capsule Take 100 mg by mouth at bedtime.   Yes [provider]  hydrALAZINE  (APRESOLINE ) 100 MG tablet Take 100 mg by mouth 2 (two) times daily. 08/24/24  Yes [provider]  linagliptin  (TRADJENTA ) 5 MG TABS tablet Take 5 mg by mouth daily.   Yes [provider]  metoprolol  succinate (TOPROL -XL) 50 MG 24 hr tablet Take 50 mg by mouth daily. 03/15/24  Yes [provider]  NIFEdipine  (ADALAT  CC) 90 MG 24 hr tablet Take 90 mg by mouth daily. 03/21/24  Yes [provider]  pantoprazole  (PROTONIX ) 40 MG tablet Take 1 tablet (40 mg total) by mouth  daily before breakfast. 12/31/22  Yes Ezzard Sonny RAMAN, PA-C  Pseudoeph-Doxylamine-DM-APAP (NYQUIL PO) Take 10 mLs by mouth 2 (two) times a week. At bedtime to maintain cold symptoms   Yes [provider]  tamsulosin  (FLOMAX ) 0.4 MG CAPS capsule Take 0.4 mg by mouth daily. 09/02/22  Yes [provider]  traZODone (DESYREL) 50 MG tablet Take 50 mg by mouth at bedtime as needed for sleep.   Yes [provider]    Scheduled Meds:  aspirin   81 mg Oral Daily   Chlorhexidine  Gluconate Cloth  6 each Topical Q0600   gabapentin   100 mg Oral QHS   heparin   5,000 Units Subcutaneous Q8H   hydrALAZINE   50 mg Oral BID   linagliptin   5 mg Oral Daily   metoprolol  succinate  12.5 mg Oral Daily   NIFEdipine   30 mg Oral Daily   pantoprazole   40 mg Oral QAC breakfast   tamsulosin   0.4 mg Oral QPC supper   Continuous Infusions:  PRN Meds: acetaminophen  **OR** acetaminophen , artificial tears,  meclizine , ondansetron  **OR** ondansetron  (ZOFRAN ) IV  Allergies:   No Known Allergies  Social History:   Social History   Socioeconomic History   Marital status: Widowed    Spouse name: Not on file   Number of children: Not on file   Years of education: Not on file   Highest education level: Not on file  Occupational History   Occupation: Retired  Tobacco Use   Smoking status: Former    Current packs/day: 0.00    Average packs/day: 2.0 packs/day for 32.9 years (65.9 ttl pk-yrs)    Types: Pipe, Cigars, Cigarettes    Start date: 11/08/1954    Quit date: 10/14/1987    Years since quitting: 36.9   Smokeless tobacco: Never   Tobacco comments:    About 2 pipes a day  Vaping Use   Vaping status: Never Used  Substance and Sexual Activity   Alcohol  use: No    Alcohol /week: 0.0 standard drinks of alcohol    Drug use: No   Sexual activity: Not Currently  Other Topics Concern   Not on file  Social History Narrative   Not on file   Family History:    Family History  Problem  Relation Age of Onset   Heart disease Mother        cause of death   Diabetes Sister    Colon cancer Neg Hx      ROS:  Please see the history of present illness.   All other ROS reviewed and negative.     Physical Exam/Data: Vitals:   09/11/24 0909 09/11/24 1359 09/11/24 2200 09/12/24 0826  BP: (!) 121/55 135/60 (!) 143/74 (!) 142/60  Pulse: 67 70 68   Resp:  17  14  Temp: (!) 97.5 F (36.4 C) 97.7 F (36.5 C) 98.7 F (37.1 C)   TempSrc: Oral Oral Oral   SpO2: 98% 99% 98%   Weight:        Intake/Output Summary (Last 24 hours) at 09/12/2024 0910 Last data filed at 09/12/2024 0438 Gross per 24 hour  Intake 120 ml  Output 400 ml  Net -280 ml      09/10/2024   12:41 PM 09/09/2024    6:24 PM 09/09/2024    2:47 PM  Last 3 Weights  Weight (lbs) 157 lb 10.1 oz 160 lb 15 oz 166 lb 0.1 oz  Weight (kg) 71.5 kg 73 kg 75.3 kg     Body mass index is 26.23 kg/m.  General:  Well nourished, well developed, in no acute distress; accompanied by son.  HEENT: normal Neck: no JVD Vascular: No carotid bruits; Distal pulses 2+ bilaterally Cardiac:  normal S1, S2; RRR; no murmur  Lungs:  clear to auscultation bilaterally, no wheezing, rhonchi or rales  Abd: soft, nontender, no hepatomegaly  Ext: no edema Musculoskeletal:  No deformities, BUE and BLE strength normal and equal Skin: warm and dry  Neuro:  CNs 2-12 intact, no focal abnormalities noted Psych:  Normal affect   EKG:  The EKG was personally reviewed and demonstrates:  NSR, HR 65, new inverted T waves in inferior lateral region  Telemetry:  Telemetry was personally reviewed and demonstrates:  NSR, HR 60-70's with sinus arrhthymias.   Relevant CV Studies: ECHO IMPRESSIONS 09/09/2024  1. Left ventricular ejection fraction, by estimation, is 70 to 75%. The  left ventricle has hyperdynamic function. The left ventricle has no  regional wall motion abnormalities. There is moderate left ventricular  hypertrophy. Left  ventricular diastolic  parameters  are consistent with Grade I diastolic dysfunction (impaired  relaxation).   2. Right ventricular systolic function is normal. The right ventricular  size is normal. There is moderately elevated pulmonary artery systolic  pressure. The estimated right ventricular systolic pressure is 50.8 mmHg.   3. Right atrial size was moderately dilated.   4. The mitral valve is degenerative. Trivial mitral valve regurgitation.   5. The aortic valve is tricuspid. Aortic valve regurgitation is not  visualized. Aortic valve sclerosis/calcification is present, without any  evidence of aortic stenosis.   6. The inferior vena cava is normal in size with <50% respiratory  variability, suggesting right atrial pressure of 8 mmHg.   Comparison(s): Changes from prior study are noted. 04/18/2020: LVEF 65-70%,  grade 2 DD, RVSP 46.8 mmHg.   Laboratory Data: High Sensitivity Troponin:  No results for input(s): TROPONINIHS in the last 720 hours.   Chemistry Recent Labs  Lab 09/07/24 2247 09/08/24 0443 09/09/24 0457 09/10/24 0402 09/12/24 0448  NA  --    < > 143 139 140  K  --    < > 4.0 3.9 3.8  CL  --    < > 107 100 100  CO2  --    < > 25 28 26   GLUCOSE  --    < > 80 95 127*  BUN  --    < > 46* 33* 51*  CREATININE  --    < > 8.94* 6.66* 8.55*  CALCIUM  --    < > 8.7* 8.7* 8.7*  MG 1.7  --  1.7  --   --   GFRNONAA  --    < > 5* 8* 6*  ANIONGAP  --    < > 12 11 13    < > = values in this interval not displayed.    Recent Labs  Lab 09/07/24 1254 09/08/24 0443 09/09/24 0457 09/10/24 0402 09/12/24 0448  PROT 7.9 6.6  --   --   --   ALBUMIN 4.3 3.5 3.6 3.8 3.6  AST 24 18  --   --   --   ALT 11 10  --   --   --   ALKPHOS 102 86  --   --   --   BILITOT 0.4 0.4  --   --   --    Lipids  Recent Labs  Lab 09/09/24 0457  CHOL 168  TRIG 93  HDL 46  LDLCALC 104*  CHOLHDL 3.7    Hematology Recent Labs  Lab 09/09/24 0457 09/10/24 0402 09/12/24 0448  WBC 5.9  5.8 5.6  RBC 4.21* 4.17* 4.24  HGB 11.0* 11.2* 11.2*  HCT 33.3* 32.3* 32.9*  MCV 79.1* 77.5* 77.6*  MCH 26.1 26.9 26.4  MCHC 33.0 34.7 34.0  RDW 17.6* 17.4* 17.5*  PLT 173 168 160   Thyroid  No results for input(s): TSH, FREET4 in the last 168 hours.  BNPNo results for input(s): BNP, PROBNP in the last 168 hours.  DDimer No results for input(s): DDIMER in the last 168 hours.  Radiology/Studies:  US  Venous Img Upper Uni Right(DVT) Result Date: 09/11/2024 CLINICAL DATA:  Right upper extremity pain and swelling EXAM: RIGHT UPPER EXTREMITY VENOUS DOPPLER ULTRASOUND TECHNIQUE: Gray-scale sonography with graded compression, as well as color Doppler and duplex ultrasound were performed to evaluate the upper extremity deep venous system from the level of the subclavian vein and including the jugular, axillary, basilic, radial, ulnar and upper cephalic vein. Spectral Doppler was utilized  to evaluate flow at rest and with distal augmentation maneuvers. COMPARISON:  None Available. FINDINGS: Contralateral Subclavian Vein: Respiratory phasicity is normal and symmetric with the symptomatic side. No evidence of thrombus. Normal compressibility. Internal Jugular Vein: No evidence of thrombus. Normal compressibility, respiratory phasicity and response to augmentation. Subclavian Vein: No evidence of thrombus. Normal compressibility, respiratory phasicity and response to augmentation. Axillary Vein: No evidence of thrombus. Normal compressibility, respiratory phasicity and response to augmentation. Cephalic Vein: No evidence of thrombus. Normal compressibility, respiratory phasicity and response to augmentation. Basilic Vein: No evidence of thrombus. Normal compressibility, respiratory phasicity and response to augmentation. Brachial Veins: No evidence of thrombus. Normal compressibility, respiratory phasicity and response to augmentation. Radial Veins: No evidence of thrombus. Normal compressibility,  respiratory phasicity and response to augmentation. Ulnar Veins: No evidence of thrombus. Normal compressibility, respiratory phasicity and response to augmentation. Venous Reflux:  None visualized. Other Findings:  None visualized. IMPRESSION: No evidence of DVT within the right upper extremity. Electronically Signed   By: Wilkie Lent M.D.   On: 09/11/2024 15:26   Assessment and Plan: Elevated troponin TN 80 > 84 > 83. Suspect elevated 2/2 to CKD.  Denies any chest pain or angina.  EKG: NSR, HR 65, inverted T waves in inferior lateral region. Could be secondary to LVH.  Tele: NSR, HR 60-70's with sinus arrhthymias.  Echo 09/09/2024 showed EF 70 to 75%, moderate LVH, G1 DD, moderately elevated PASP with RVSP of 50.8 mmHg, moderately dilated RA, trivial MV regurgitation, AV sclerosis/calcification without AS Less concern for ACS with no angina symptoms, flat troponin's, ECHO w/o +RWMA.   Elevated PASP Son reports that patient does snore but not sure about sleep apnea. No sleep study previously. Can reassess for OSA with PCP.  Less likely PE. No S1Q3T3. No chest pain or worsening SOB. No d-dimer ordered  No known lung disease.   Dizziness Presentation c/w with BPPV. Continue with vestibular rehab. ECHO with no significant structural findings  Carotid US :  mild stenosis in LICA at 1 to 49% in moderate stenosis in RICA at 50 to 69%.  Less concern for cardiac cause based on presentation and workup   HTN Reports that he does not take BP meds the morning of dialysis, however resumes with evening doses and as scheduled on nondialysis day. Notes BP usually more controlled after dialysis but can not recall specific range.  Per chart review, reduced Toprol  XL from 50 mg to 12.5 mg, hydralazine  100 mg twice daily to 50 mg twice daily, nifedipine  90 mg to 30 mg daily due to softer BPs and bradycardia.  BP 142/60  Continue with antiHTN med changes as above.  Encourage to monitor BP at home.    ESRD on HD (TTS) Palpations  LE edema  Reports chronic unchanged palpitations and LE edema since being on dialysis. Note LE edema resolve with dialysis and leg elevation.  Would not consider zio at this time. Discussed if increase in frequency and duration then would consider zio.  Nephrology consulted to continue hemodialysis during hospitalization Continue to follow with nephrology.    For questions or updates, please contact [facility] Please consult [email or web address] for contact info under      Signed, Lorette CINDERELLA Kapur, PA-C  09/12/2024 9:10 AM

## 2024-10-18 ENCOUNTER — Encounter (HOSPITAL_COMMUNITY): Payer: Self-pay

## 2024-10-19 ENCOUNTER — Other Ambulatory Visit: Payer: Self-pay

## 2024-10-19 ENCOUNTER — Encounter (HOSPITAL_COMMUNITY): Admission: RE | Disposition: A | Payer: Self-pay | Source: Home / Self Care | Attending: Vascular Surgery

## 2024-10-19 ENCOUNTER — Ambulatory Visit (HOSPITAL_COMMUNITY)
Admission: RE | Admit: 2024-10-19 | Discharge: 2024-10-19 | Disposition: A | Discharge status: Home / Self Care | Attending: Vascular Surgery | Admitting: Vascular Surgery

## 2024-10-19 DIAGNOSIS — N186 End stage renal disease: Secondary | ICD-10-CM | POA: Insufficient documentation

## 2024-10-19 DIAGNOSIS — T82858A Stenosis of vascular prosthetic devices, implants and grafts, initial encounter: Secondary | ICD-10-CM | POA: Diagnosis not present

## 2024-10-19 DIAGNOSIS — E1122 Type 2 diabetes mellitus with diabetic chronic kidney disease: Secondary | ICD-10-CM | POA: Insufficient documentation

## 2024-10-19 DIAGNOSIS — I120 Hypertensive chronic kidney disease with stage 5 chronic kidney disease or end stage renal disease: Secondary | ICD-10-CM | POA: Insufficient documentation

## 2024-10-19 DIAGNOSIS — Y832 Surgical operation with anastomosis, bypass or graft as the cause of abnormal reaction of the patient, or of later complication, without mention of misadventure at the time of the procedure: Secondary | ICD-10-CM | POA: Insufficient documentation

## 2024-10-19 DIAGNOSIS — Z992 Dependence on renal dialysis: Secondary | ICD-10-CM | POA: Diagnosis not present

## 2024-10-19 DIAGNOSIS — F17290 Nicotine dependence, other tobacco product, uncomplicated: Secondary | ICD-10-CM | POA: Diagnosis not present

## 2024-10-19 HISTORY — PX: A/V FISTULAGRAM: CATH118298

## 2024-10-19 HISTORY — PX: VENOUS ANGIOPLASTY: CATH118376

## 2024-10-19 LAB — GLUCOSE, CAPILLARY: Glucose-Capillary: 87 mg/dL (ref 70–99)

## 2024-10-19 SURGERY — A/V FISTULAGRAM
Anesthesia: LOCAL | Site: Arm Upper | Laterality: Left

## 2024-10-19 MED ORDER — LIDOCAINE HCL (PF) 1 % IJ SOLN
INTRAMUSCULAR | Status: AC
  Filled 2024-10-19: qty 30

## 2024-10-19 MED ORDER — HEPARIN (PORCINE) IN NACL 1000-0.9 UT/500ML-% IV SOLN
INTRAVENOUS | Status: DC | PRN
  Administered 2024-10-19: 500 mL

## 2024-10-19 MED ORDER — LIDOCAINE HCL (PF) 1 % IJ SOLN
INTRAMUSCULAR | Status: DC | PRN
  Administered 2024-10-19: 2 mL via SUBCUTANEOUS

## 2024-10-19 MED ORDER — IODIXANOL 320 MG/ML IV SOLN
INTRAVENOUS | Status: DC | PRN
  Administered 2024-10-19: 35 mL via INTRAVENOUS

## 2024-10-19 SURGICAL SUPPLY — 9 items
BALLOON MUSTANG 7X100X75 (BALLOONS) IMPLANT
DEVICE INFLATION ENCORE 26 (MISCELLANEOUS) IMPLANT
GUIDEWIRE ANGLED .035X150CM (WIRE) IMPLANT
KIT MICROPUNCTURE NIT STIFF (SHEATH) IMPLANT
SHEATH PINNACLE R/O II 6F 4CM (SHEATH) IMPLANT
SHEATH PROBE COVER 6X72 (BAG) IMPLANT
STOPCOCK MORSE 400PSI 3WAY (MISCELLANEOUS) IMPLANT
TRAY PV CATH (CUSTOM PROCEDURE TRAY) ×2 IMPLANT
TUBING CIL FLEX 10 FLL-RA (TUBING) IMPLANT

## 2024-10-19 NOTE — Op Note (Signed)
" ° ° °  Patient name: Steve [name] MRN: [id number] DOB: 09/24/1936 Sex: male  10/19/2024 Pre-operative Diagnosis: ESRD on HD Post-operative diagnosis:  Same Surgeon:  Norman GORMAN Serve, MD Procedure Performed:  Outpatient evaluation, level 3 Ultrasound-guided access of dialysis circuit, fistulogram and central venogram, peripheral balloon angioplasty.  [id number]  Indications: [name] is an 89 year old male with ESRD and a poorly functioning left forearm loop graft.  He has been having issues with pain during sessions.  Discussed the first-line option of fistulogram to better evaluate the flow and potentially treat any flow-limiting stenoses that could put the access at risk of future thrombosis.  Risks and benefits were reviewed and he elected to proceed.  Findings:  Widely patent central venous system.  Widely patent upper arm outflow vein.  Near the venous anastomosis there is an approximate 70% stenosis at the [facility] as well as a an additional 50 to 60% stenosis in the mid brachial vein.  Arterial anastomosis and graft are widely patent.   Procedure:  The patient was identified in the holding area and taken to the cath lab  The patient was then placed supine on the table and prepped and draped in the usual sterile fashion.  A time out was called.  Ultrasound was used to evaluate the left arm AV access. This was accessed under u/s guidance. An 018 wire was advanced without resistance, a micropuncture sheath was placed and fistulagram obtained which demonstrated the above findings.  This access was then upsized to a 6 F short sheath.  A Glidewire was used to cross the lesions and these were then treated with a 7 mm x 100 mm Mustang balloon.  The wire and sheath were removed and the access was managed with a 4-0 Monocryl figure-of-eight suture for hemostasis.  Contrast: 35 cc Sedation: None  Impression: Successful balloon angioplasty of the brachial outflow vein. Remains amenable to endovascular  treatment   Norman GORMAN Serve MD [facility] Office: [phone]  "

## 2024-10-19 NOTE — H&P (Signed)
 " HD ACCESS CENTER H&P   Patient ID: Steve [name], male   DOB: August 24, 1936, 89 y.o.   MRN: [id number]  Subjective:     HPI Steve [name] is a 89 y.o. male with ESRD presenting for evaluation of HD access and consideration of intervention. Referred by: Florie Having issues with pain and cramping during sessions  Past Medical History:  Diagnosis Date   Anemia    Arthritis    ESRD (end stage renal disease) on dialysis (HCC)    T/TH/Sat   Essential hypertension    GERD (gastroesophageal reflux disease)    Hyperlipidemia    Prostate cancer (HCC)    took radiation   Type 2 diabetes mellitus (HCC)    Family History  Problem Relation Age of Onset   Heart disease Mother        cause of death   Diabetes Sister    Colon cancer Neg Hx    Past Surgical History:  Procedure Laterality Date   ANTERIOR CERVICAL DECOMP/DISCECTOMY FUSION N/A 08/23/2020   Procedure: Cervical 3-4 Anterior cervical decompression/discectomy/fusion;  Surgeon: Unice Pac, MD;  Location: [facility];  Service: Neurosurgery;  Laterality: N/A;  3C/RM 21 to follow   AV FISTULA PLACEMENT Left 02/21/2021   Procedure: INSERTION OF ARTERIOVENOUS GORE-TEX GRAFT LEFT ARM;  Surgeon: Oris Krystal FALCON, MD;  Location: [facility];  Service: Vascular;  Laterality: Left;   BIOPSY  10/22/2022   Procedure: BIOPSY;  Surgeon: Shaaron Lamar HERO, MD;  Location: [facility];  Service: Endoscopy;;   CATARACT EXTRACTION W/PHACO Right 04/28/2016   Procedure: CATARACT EXTRACTION PHACO AND INTRAOCULAR LENS PLACEMENT (IOC);  Surgeon: Dow FALCON Burke, MD;  Location: [facility];  Service: Ophthalmology;  Laterality: Right;  CDE: 4.28   COLONOSCOPY     about 2 years in [place] around 2016   CYSTECTOMY     pt. denies   ESOPHAGOGASTRODUODENOSCOPY (EGD) WITH PROPOFOL  N/A 10/22/2022   Procedure: ESOPHAGOGASTRODUODENOSCOPY (EGD) WITH PROPOFOL ;  Surgeon: Shaaron Lamar HERO, MD;  Location: [facility];  Service: Endoscopy;  Laterality: N/A;  12:30 pm, pt know to  arrive at 9:15, LM to see if pt can come earlier   IR FLUORO GUIDE CV LINE LEFT  12/20/2020   IR US  GUIDE VASC ACCESS LEFT  12/20/2020   MALONEY DILATION N/A 10/22/2022   Procedure: AGAPITO DILATION;  Surgeon: Shaaron Lamar HERO, MD;  Location: [facility];  Service: Endoscopy;  Laterality: N/A;    Short Social History:  Social History   Tobacco Use   Smoking status: Former    Current packs/day: 0.00    Average packs/day: 2.0 packs/day for 32.9 years (65.9 ttl pk-yrs)    Types: Pipe, Cigars, Cigarettes    Start date: 11/08/1954    Quit date: 10/14/1987    Years since quitting: 37.0   Smokeless tobacco: Never   Tobacco comments:    About 2 pipes a day  Substance Use Topics   Alcohol  use: No    Alcohol /week: 0.0 standard drinks of alcohol     Allergies[1]  No current facility-administered medications for this encounter.    REVIEW OF SYSTEMS All other systems were reviewed and are negative     Objective:   Objective   Vitals:   10/19/24 0922 10/19/24 0932  BP: (!) 168/59 (!) 155/65  Pulse: 63 60  Resp: 12 16  Temp: 97.7 F (36.5 C)   TempSrc: Oral   SpO2: 100% 99%   There is no height or weight on file to calculate  BMI.  Physical Exam General: no acute distress Cardiac: hemodynamically stable Extremities: Palpable thrill and left forearm AV loop graft, palpable radial pulse      Assessment/Plan:   Steve [name] is a 89 y.o. male with ESRD and a poorly functioning left arm AVF. Having issues with pain and cramping during sessions.  We discussed first-line option of fistulogram to better evaluate flow as well as potentially treat flow-limiting stenoses that could put this access at risk of future thrombosis. Risks and benefits were reviewed and the patient elected to proceed.   Norman Serve, MD [facility]       [1] No Known Allergies  "

## 2024-10-20 ENCOUNTER — Encounter (HOSPITAL_COMMUNITY): Payer: Self-pay | Admitting: Vascular Surgery
